# Patient Record
Sex: Male | Born: 1945 | Race: White | Hispanic: No | Marital: Married | Smoking: Former smoker
Health system: Southern US, Community
[De-identification: ages and names within clinical notes are randomized; demographics above are authoritative.]

## PROBLEM LIST (undated history)

## (undated) DIAGNOSIS — C4491 Basal cell carcinoma of skin, unspecified: Secondary | ICD-10-CM

## (undated) DIAGNOSIS — R972 Elevated prostate specific antigen [PSA]: Secondary | ICD-10-CM

## (undated) DIAGNOSIS — I10 Essential (primary) hypertension: Secondary | ICD-10-CM

## (undated) DIAGNOSIS — J449 Chronic obstructive pulmonary disease, unspecified: Secondary | ICD-10-CM

## (undated) DIAGNOSIS — I6529 Occlusion and stenosis of unspecified carotid artery: Secondary | ICD-10-CM

## (undated) DIAGNOSIS — E785 Hyperlipidemia, unspecified: Secondary | ICD-10-CM

## (undated) DIAGNOSIS — M7072 Other bursitis of hip, left hip: Secondary | ICD-10-CM

## (undated) DIAGNOSIS — M1990 Unspecified osteoarthritis, unspecified site: Secondary | ICD-10-CM

## (undated) DIAGNOSIS — I4510 Unspecified right bundle-branch block: Secondary | ICD-10-CM

## (undated) DIAGNOSIS — T8859XA Other complications of anesthesia, initial encounter: Secondary | ICD-10-CM

## (undated) DIAGNOSIS — R011 Cardiac murmur, unspecified: Secondary | ICD-10-CM

## (undated) DIAGNOSIS — K5790 Diverticulosis of intestine, part unspecified, without perforation or abscess without bleeding: Secondary | ICD-10-CM

## (undated) DIAGNOSIS — R0600 Dyspnea, unspecified: Secondary | ICD-10-CM

## (undated) DIAGNOSIS — K648 Other hemorrhoids: Secondary | ICD-10-CM

## (undated) DIAGNOSIS — F419 Anxiety disorder, unspecified: Secondary | ICD-10-CM

## (undated) DIAGNOSIS — G4733 Obstructive sleep apnea (adult) (pediatric): Secondary | ICD-10-CM

## (undated) DIAGNOSIS — N419 Inflammatory disease of prostate, unspecified: Secondary | ICD-10-CM

## (undated) DIAGNOSIS — D649 Anemia, unspecified: Secondary | ICD-10-CM

## (undated) DIAGNOSIS — N400 Enlarged prostate without lower urinary tract symptoms: Secondary | ICD-10-CM

## (undated) DIAGNOSIS — T4145XA Adverse effect of unspecified anesthetic, initial encounter: Secondary | ICD-10-CM

## (undated) HISTORY — DX: Occlusion and stenosis of unspecified carotid artery: I65.29

## (undated) HISTORY — DX: Anemia, unspecified: D64.9

## (undated) HISTORY — DX: Diverticulosis of intestine, part unspecified, without perforation or abscess without bleeding: K57.90

## (undated) HISTORY — DX: Chronic obstructive pulmonary disease, unspecified: J44.9

## (undated) HISTORY — DX: Obstructive sleep apnea (adult) (pediatric): G47.33

## (undated) HISTORY — DX: Basal cell carcinoma of skin, unspecified: C44.91

## (undated) HISTORY — PX: PROSTATE BIOPSY: SHX241

## (undated) HISTORY — DX: Other hemorrhoids: K64.8

## (undated) HISTORY — DX: Cardiac murmur, unspecified: R01.1

## (undated) HISTORY — DX: Inflammatory disease of prostate, unspecified: N41.9

## (undated) HISTORY — DX: Other bursitis of hip, left hip: M70.72

## (undated) HISTORY — DX: Elevated prostate specific antigen (PSA): R97.20

## (undated) HISTORY — DX: Anxiety disorder, unspecified: F41.9

## (undated) HISTORY — PX: PROSTATE SURGERY: SHX751

---

## 1998-03-26 ENCOUNTER — Ambulatory Visit (HOSPITAL_COMMUNITY): Admission: RE | Admit: 1998-03-26 | Discharge: 1998-03-26 | Payer: Self-pay | Admitting: Internal Medicine

## 2000-04-09 ENCOUNTER — Emergency Department (HOSPITAL_COMMUNITY): Admission: EM | Admit: 2000-04-09 | Discharge: 2000-04-09 | Payer: Self-pay | Admitting: Emergency Medicine

## 2008-09-18 HISTORY — PX: COLONOSCOPY: SHX174

## 2011-02-02 ENCOUNTER — Ambulatory Visit (INDEPENDENT_AMBULATORY_CARE_PROVIDER_SITE_OTHER): Payer: BC Managed Care – PPO | Admitting: Internal Medicine

## 2011-02-02 ENCOUNTER — Encounter: Payer: Self-pay | Admitting: Internal Medicine

## 2011-02-02 VITALS — BP 124/80 | HR 76 | Ht 67.5 in | Wt 167.0 lb

## 2011-02-02 DIAGNOSIS — Z0000 Encounter for general adult medical examination without abnormal findings: Secondary | ICD-10-CM

## 2011-02-02 DIAGNOSIS — R0989 Other specified symptoms and signs involving the circulatory and respiratory systems: Secondary | ICD-10-CM

## 2011-02-02 DIAGNOSIS — F17200 Nicotine dependence, unspecified, uncomplicated: Secondary | ICD-10-CM

## 2011-02-02 DIAGNOSIS — R030 Elevated blood-pressure reading, without diagnosis of hypertension: Secondary | ICD-10-CM

## 2011-02-02 DIAGNOSIS — J42 Unspecified chronic bronchitis: Secondary | ICD-10-CM

## 2011-02-02 NOTE — Progress Notes (Signed)
Subjective:    Patient ID: Brian Hunt, male    DOB: 08-06-1946, 65 y.o.   MRN: [id number]  HPI [name] is here to establish as primary care patient. His BP was noted to be 180/94 @ [facility] 's office in 12/2010; he has no PMH of HTN. It has come down w/o Rx.    Review of Systems Patient reports no  vision/ hearing changes,anorexia, weight change, fever ,adenopathy, persistant / recurrent hoarseness, swallowing issues, chest pain,palpitations, edema, hemoptysis, dyspnea(rest, exertional, paroxysmal nocturnal), gastrointestinal  bleeding (melena, rectal bleeding), abdominal pain, excessive heart burn, GU symptoms( dysuria, hematuria, pyuria, voiding/incontinence  issues) syncope, focal weakness, memory loss, hair/nail changes,depression, anxiety, or  abnormal bruising/bleeding. Active bursitis L hip ; unresponsive to steroid injection X 1 by Dr Brian Hunt. Dr. Elmer Hunt states that he may have early glaucoma and macular degeneration. There has been no definite diagnosis made to date. He does have a new skin lesion which he plans to have checked to rule out a recurrent basal cell cancer. He has a daily cough, especially in the mornings with sputum production.     Objective:   Physical Exam Gen.: Healthy and well-nourished in appearance. Alert, appropriate and cooperative throughout exam. Head: Normocephalic without obvious abnormalities;  pattern alopecia . Beard & moustache. Eyes: No corneal or conjunctival inflammation noted. Pupils equal round reactive to light and accommodation. Fundal exam is benign without hemorrhages, exudate, papilledema. Extraocular motion intact. Vision grossly normal. Ears: External  ear exam reveals no significant lesions or deformities. Canals clear .TMs normal. Hearing is grossly normal bilaterally with glasses. Nose: External nasal exam reveals no deformity or inflammation. Nasal mucosa are pink and moist. No lesions or exudates noted.  Mouth: Oral mucosa and oropharynx reveal  no lesions or exudates. Teeth in good repair. Neck: No deformities, masses, or tenderness noted. Range of motion &. Thyroid  normal. Lungs: Normal respiratory effort; chest expands symmetrically. Lungs are clear to auscultation without rales, wheezes, or increased work of breathing. Heart: Normal rate and rhythm. Normal S1 and S2. No gallop, click, or rub. S4 with slurring; no murmur. Abdomen: Bowel sounds normal; abdomen soft and nontender. No masses, organomegaly or hernias noted. Genitalia: Dr Brian Hunt.                                                                                     Musculoskeletal/extremities: No deformity or scoliosis noted of  the thoracic or lumbar spine but asymmetrical thoracic musculature, R > L . No clubbing, cyanosis, edema, or deformity noted. Range of motion  normal .Tone & strength  normal.Joints normal. Nail health  good. Vascular: Carotid, radial artery, dorsalis pedis and dorsalis posterior tibial pulses are full and equal.  l carotid  Bruit  Present.faint rumble R carotid Neurologic: Alert and oriented x3. Deep tendon reflexes symmetrical and normal.         Skin: Intact without suspicious lesions or rashes. Lymph: No cervical, axillary, or inguinal lymphadenopathy present. Psych: Mood and affect are normal. Normally interactive  Assessment & Plan:  #1 Complete physical, new patient .                                                                                                             #2 elevated BP ,PMH of, w/o diagnosis of HTN. BP now controlled.                                                           #3 carotid bruits.                                                                                                                                           #4 smoker; risk discussed .                                                                                                                          Plan: BP monitor; goal = average < 135/85.See orders &recommendations.

## 2011-02-02 NOTE — Progress Notes (Signed)
Addended by: Stephan Minister on: 02/02/2011 01:58 PM   Modules accepted: Orders

## 2011-02-02 NOTE — Patient Instructions (Addendum)
Please schedule fasting labs: BMET, hepatic panel, CBC & dif,TSH, & lipids (V70.0) .                           Preventive Health Care: Exercise at least 30-45 minutes a day,  3-4 days a week.  Eat a low-fat diet with lots of fruits and vegetables, up to 7-9 servings per day. Avoid obesity; your goal is waist measurement < 40 inches.Consume less than 40 grams of sugar per day from foods & drinks with High Fructose Corn Sugar as #2,3 or # 4 on label. Health Care Power of Attorney & Living Will. Complete if not in place ; these place you in charge of your health care decisions. Please think about quitting smoking. Review the risks we discussed. Please call 1-800-QUIT-NOW [phone]) for free smoking cessation counseling.  Continue daily aspirin.

## 2011-02-08 ENCOUNTER — Ambulatory Visit (INDEPENDENT_AMBULATORY_CARE_PROVIDER_SITE_OTHER)
Admission: RE | Admit: 2011-02-08 | Discharge: 2011-02-08 | Disposition: A | Discharge status: Home / Self Care | Payer: BC Managed Care – PPO | Source: Ambulatory Visit | Attending: Internal Medicine | Admitting: Internal Medicine

## 2011-02-08 ENCOUNTER — Other Ambulatory Visit (INDEPENDENT_AMBULATORY_CARE_PROVIDER_SITE_OTHER): Payer: BC Managed Care – PPO

## 2011-02-08 DIAGNOSIS — J42 Unspecified chronic bronchitis: Secondary | ICD-10-CM

## 2011-02-08 DIAGNOSIS — F17200 Nicotine dependence, unspecified, uncomplicated: Secondary | ICD-10-CM

## 2011-02-08 DIAGNOSIS — Z0000 Encounter for general adult medical examination without abnormal findings: Secondary | ICD-10-CM

## 2011-02-08 LAB — LIPID PANEL
Cholesterol: 168 mg/dL (ref 0–200)
LDL Cholesterol: 98 mg/dL (ref 0–99)
Triglycerides: 23 mg/dL (ref 0.0–149.0)
VLDL: 4.6 mg/dL (ref 0.0–40.0)

## 2011-02-08 LAB — CBC WITH DIFFERENTIAL/PLATELET
Basophils Relative: 0.5 % (ref 0.0–3.0)
Eosinophils Relative: 2 % (ref 0.0–5.0)
Lymphocytes Relative: 19.2 % (ref 12.0–46.0)
Monocytes Absolute: 0.7 10*3/uL (ref 0.1–1.0)
Neutrophils Relative %: 68 % (ref 43.0–77.0)
Platelets: 271 10*3/uL (ref 150.0–400.0)
RBC: 4.25 Mil/uL (ref 4.22–5.81)
WBC: 7 10*3/uL (ref 4.5–10.5)

## 2011-02-08 LAB — BASIC METABOLIC PANEL
BUN: 19 mg/dL (ref 6–23)
CO2: 28 mEq/L (ref 19–32)
Chloride: 100 mEq/L (ref 96–112)
Creatinine, Ser: 0.8 mg/dL (ref 0.4–1.5)
Glucose, Bld: 100 mg/dL — ABNORMAL HIGH (ref 70–99)
Potassium: 4.5 mEq/L (ref 3.5–5.1)

## 2011-02-08 LAB — HEPATIC FUNCTION PANEL
ALT: 18 U/L (ref 0–53)
AST: 19 U/L (ref 0–37)
Albumin: 3.6 g/dL (ref 3.5–5.2)
Total Protein: 6.6 g/dL (ref 6.0–8.3)

## 2011-02-08 LAB — TSH: TSH: 2.33 u[IU]/mL (ref 0.35–5.50)

## 2011-02-10 ENCOUNTER — Encounter (INDEPENDENT_AMBULATORY_CARE_PROVIDER_SITE_OTHER): Payer: BC Managed Care – PPO | Admitting: Cardiology

## 2011-02-10 DIAGNOSIS — I6529 Occlusion and stenosis of unspecified carotid artery: Secondary | ICD-10-CM

## 2011-02-10 DIAGNOSIS — R0989 Other specified symptoms and signs involving the circulatory and respiratory systems: Secondary | ICD-10-CM

## 2011-02-10 DIAGNOSIS — F17200 Nicotine dependence, unspecified, uncomplicated: Secondary | ICD-10-CM

## 2011-02-10 DIAGNOSIS — R030 Elevated blood-pressure reading, without diagnosis of hypertension: Secondary | ICD-10-CM

## 2011-02-15 ENCOUNTER — Encounter: Payer: Self-pay | Admitting: Internal Medicine

## 2011-02-16 ENCOUNTER — Telehealth: Payer: Self-pay

## 2011-02-16 NOTE — Telephone Encounter (Signed)
Message copied by Edgardo Roys on Thu Feb 16, 2011 10:14 AM ------      Message from: Pecola Lawless      Created: Thu Feb 16, 2011  7:53 AM       There is marked blockage of the left carotid artery; its not quite clear how severe it is. Please consider the CAT scan to define the degree of blockage better. If you agree I will schedule this study. Please take 325 mg of coated aspirin daily.[employer]

## 2011-02-16 NOTE — Telephone Encounter (Signed)
Spoke with patient, appointment scheduled for tomorrow to further discuss

## 2011-02-17 ENCOUNTER — Ambulatory Visit (INDEPENDENT_AMBULATORY_CARE_PROVIDER_SITE_OTHER): Payer: BC Managed Care – PPO | Admitting: Internal Medicine

## 2011-02-17 ENCOUNTER — Ambulatory Visit: Payer: Self-pay | Admitting: Internal Medicine

## 2011-02-17 ENCOUNTER — Encounter: Payer: Self-pay | Admitting: Internal Medicine

## 2011-02-17 VITALS — BP 130/88 | HR 65 | Wt 167.0 lb

## 2011-02-17 DIAGNOSIS — F17200 Nicotine dependence, unspecified, uncomplicated: Secondary | ICD-10-CM

## 2011-02-17 DIAGNOSIS — I6529 Occlusion and stenosis of unspecified carotid artery: Secondary | ICD-10-CM

## 2011-02-17 MED ORDER — PRAVASTATIN SODIUM 20 MG PO TABS: 20.0000 mg | ORAL_TABLET | Freq: Every evening | ORAL | Status: DC

## 2011-02-17 NOTE — Progress Notes (Signed)
  Subjective:    Patient ID: Brian Hunt, male    DOB: 11-Apr-1946, 65 y.o.   MRN: [id number]  HPI Brian Hunt is here to review his carotid  Doppler findings. This suggests  at least a 60-79% blockage on the left. He is asymptomatic with no neurologic symptoms of dizziness, gait dysfunction, weakness, or numbness or tingling.  His LDL is 98 and his HDL is protective at 65.4. He exercises 3 times a week for a total of at least 90 minutes. His A1c is at the cutoff for diabetes X6.1. This will be associated with a 22 % risk. He continues to smoke which doubles or triples  risk of heart attack or stroke.  His paternal grandmother had  heart attack in her 63s; his mother had TIAs in her 63s.      Review of Systems     Objective:   Physical Exam Gen.: Thin but healthy and well-nourished in appearance. Alert, appropriate and cooperative throughout exam. Lungs: Normal respiratory effort; chest expands symmetrically. Lungs ; mild musical rhonchi w/o  increased work of breathing. Heart: Normal rate and rhythm. Normal S1 and S2. No gallop, click, or rub. No murmur. Vascular: Carotid, radial artery, dorsalis pedis and dorsalis posterior tibial pulses are full and equal. L carotid  Bruit ; R slight "rumble" present. Neurologic: Alert and oriented x3.  Skin: Intact without  Ischemic changes Psych: Mood and affect are normal. Normally interactive                                                                                         Assessment & Plan:  #1 significant left carotid artery disease  #2 smoker  #3 borderline diabetes  Plan: #1 Referral to cardiovascular and thoracic surgeon for their expert  opinion  #2 encourage smoking cessation  #3 full dose coated aspirin, 325 mg  #4 low-dose statin to attempt to get LDL less than 70.

## 2011-02-17 NOTE — Patient Instructions (Addendum)
Preventive Health Care: Exercise at least 30-45 minutes a day,  3-4 days a week.  Eat a low-fat diet with lots of fruits and vegetables, up to 7-9 servings per day. Avoid obesity; your goal is waist measurement < 40 inches.Consume less than 40 grams of sugar per day from foods & drinks with High Fructose Corn Sugar as #2,3 or # 4 on label.  Please think about quitting smoking.It increases your risk of heart attack or stroke 2-3 times normal risk for your age. Please call 1-800-QUIT-NOW [phone]) for free smoking cessation counseling.  Repeat fasting labs in 10 weeks: A1c, lipids, AST,ALT (790.29,carotid artery plaque)

## 2011-03-15 NOTE — Progress Notes (Signed)
[facility] [place]  New Carotid Patient  Referred by: Dr. Marga Melnick  Reason for referral: L carotid stenosis   History of Present Illness  Brian Hunt is a 65 y.o. male who presents with chief complaint: asx right carotid bruit.  Previous carotid studies (02/10/11) demonstrated: RICA 0-39% stenosis, LICA 60-79% stenosis.  Patient has no history of TIA or stroke symptom.  The patient has never had amaurosis fugax or monocular blindness.  The patient has never had facial drooping or hemiplegia.  The patient has never had receptive or expressive aphasia.    Past Medical History  Diagnosis Date  . Bursitis of left hip     Dr. Constance Goltz  . 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 Prostatitis     Dr Vonita Moss  . Elevated PSA     Past Surgical History  Procedure Date  . Prostate biopsy 2007 , 2009    X2 , Dr Cleatrice Burke  & Dr Vonita Moss  . Colonoscopy 2010    negative    History   Social History  . Marital Status: Married    Spouse Name: N/A    Number of Children: N/A  . Years of Education: N/A   Occupational History  . Not on file.   Social History Main Topics  . Smoking status: Current Everyday Smoker -- 1.0 packs/day for 40 years    Types: Cigarettes  . Smokeless tobacco: Not on file  . Alcohol Use: No  . Drug Use: Not on file  . Sexually Active: Not on file   Other Topics Concern  . Not on file   Social History Narrative  . No narrative on file    Family History  Problem Relation Age of Onset  . Arthritis Mother   . Heart disease Mother   . Hypertension Mother   . Bone cancer Maternal Grandmother   . Coronary artery disease Paternal Grandmother     MI in 6s    Current outpatient prescriptions:pravastatin (PRAVACHOL) 20 MG tablet, Take 1 tablet (20 mg total) by mouth every evening. 1/2  qhs, Disp: 30 tablet, Rfl: 2  Allergies as of 03/17/2011  . (No Known Allergies)    Review of Systems (Positive items in bold and italic,  otherwise negative)  General: Weight loss, Weight gain, Loss of appetite, Fever  Neurologic: Dizziness, Blackouts, Headaches, Seizure  Ear/Nose/Throat: Change in eyesight, Change in hearing, Nose bleeds, Sore throat  Vascular: Pain in legs with walking, Pain in feet while lying flat, Non-healing ulcer, Stroke, "Mini stroke", Slurred speech, Temporary blindness, Blood clot in vein, Phlebitis  Pulmonary: Home oxygen, Productive cough, Bronchitis, Coughing up blood, Asthma, Wheezing  Musculoskeletal: Arthritis, Joint pain, Muscle pain  Cardiac: Chest pain, Chest tightness/pressure, Shortness of breath with exertion, Shortness of breath when lying flat, Palpitations, Heart murmur, Arrythmia, Atrial fibrillation  Hematologic: Bleeding problems, Clotting disorder, Anemia  Psychiatric:  Depression, Anxiety, Attention deficit disorder  Gastrointestinal:  Black stool, Blood in stool, Peptic ulcer disease, Reflux, Hiatal hernia, Trouble swallowing, Diarrhea, Constipation  Urinary:  Kidney disease, Burning with urination, Frequent urination, Difficulty urinating  Skin: Ulcers, Rashes  Physical Examination  Filed Vitals:   03/17/11 1004  BP: 163/85  Pulse: 72    General: A&O x 3, WDWN  Head: [place]/AT  Ear/Nose/Throat: Hearing grossly intact, nares w/o erythema or drainage, oropharynx w/o Erythema/Exudate  Eyes: PERRLA, EOMI  Neck: Supple, no nuchal rigidity, no palpable LAD  Pulmonary: Sym exp, good  air movt, CTAB, no rales, rhonchi, & wheezing  Cardiac: RRR, Nl S1, S2, no Murmurs, rubs or gallops  Vascular: Vessel Right Left  Radial Palpable Palpable  Ulnary Palpable Palpable  Brachial Palpable Palpable  Carotid Palpable, without bruit* Palpable, without bruit*  Aorta Non-palpable N/A  Femoral Palpable Palpable  Popliteal Non-palpable Non-palpable  PT Palpable Palpable  DP Palpable Palpable  * heart sounds can be ausc  Gastrointestinal: soft, NTND, -G/R, - HSM, -  masses, - CVAT B  Musculoskeletal: M/S 5/5 throughout, Extremities without ischemic changes  Neurologic: CN 2-12 intact, Pain and light touch intact in extremities, Motor exam as listed above  Psychiatric: Judgment intact, Mood & affect appropriate for pt's clinical situation  Dermatologic: See M/S exam for extremity exam, no rashes otherwise noted  Lymph : No Cervical, Axillary, or Inguinal lymphadenopathy   Non-Invasive Vascular Imaging  CAROTID DUPLEX (Date: 02/10/11):   R ICA stenosis: 0-39%  R VA: patent and antegrade  L ICA stenosis: 60-79%  L VA: patent and antegrade  Outside Studies/Documentation 15 pages of outside documents were reviewed including: B carotid duplex.  Medical Decision Making  Brian Hunt is a 65 y.o. male who presents with: L ICA stenosis.   As a precaution with the severe calcification in the LICA.  I recommend CTA chest and neck to evaluate the LICA by angiographic technique without the 1% stroke risk of a carotid angiogram  I discussed in depth with the patient the nature of atherosclerosis, and emphasized the importance of maximal medical management including strict control of blood pressure, blood glucose, and lipid levels, obtaining regular exercise, and cessation of smoking.  The patient is aware that without maximal medical management the underlying atherosclerotic disease process will progress, limiting the benefit of any interventions.  There are no indications for intervention at this point based on ACAS data, on just the basis of the duplex results.  I recommend B carotid duplex every 6 month for surveillance.  Once the stenosis exceeds 80%, ACAS data would suggest an advantage to proceeding with carotid endarterectomy.  The patient will follow in the next 1-2 weeks after the CTA is completed.  Thank you for allowing [place] to participate in this patient's care.  Leonides Sake, MD [facility] [place] [place] Office:  [phone] Pager: [phone]

## 2011-03-17 ENCOUNTER — Other Ambulatory Visit: Payer: Self-pay | Admitting: Vascular Surgery

## 2011-03-17 ENCOUNTER — Ambulatory Visit (INDEPENDENT_AMBULATORY_CARE_PROVIDER_SITE_OTHER): Payer: BC Managed Care – PPO | Admitting: Vascular Surgery

## 2011-03-17 ENCOUNTER — Encounter: Payer: Self-pay | Admitting: Vascular Surgery

## 2011-03-17 VITALS — BP 163/85 | HR 72

## 2011-03-17 DIAGNOSIS — I6529 Occlusion and stenosis of unspecified carotid artery: Secondary | ICD-10-CM

## 2011-03-17 DIAGNOSIS — I712 Thoracic aortic aneurysm, without rupture: Secondary | ICD-10-CM

## 2011-03-17 NOTE — Progress Notes (Signed)
New carotid pt referred by Dr Alwyn Ren

## 2011-04-05 NOTE — Progress Notes (Signed)
[facility] [place]  Established Carotid Patient  History of Present Illness  Brian Hunt is a 65 y.o. male who presents with chief complaint: follow up on CTA chest and neck. Previous carotid studies demonstrated: RICA <50% stenosis, LICA 60-79% stenosis with possible calcific shadowing leading to underestimation of stenosis.  Patient has remain asymptomatic.    Past Medical History, Past Surgical History, Social History, Family History, Medications, Allergies, and Review of Systems are unchanged from previous evaluation on 03/17/11.  Physical Examination  Filed Vitals:   04/07/11 1021  BP: 174/100  Pulse: 76  Resp: 20   General: A&O x 3, WDWN   Eyes: PERRLA, EOMI   Neurologic: CN 2-12 intact, Pain and light touch intact in extremities, Motor exam as listed above   Non-Invasive Vascular Imaging  CTA Chest and Neck (Date: 04/07/11):    1.  70% stenosis of the proximal left internal carotid artery   secondary to calcified and noncalcified plaque.   2.  Calcified plaque at the proximal right internal carotid artery   and carotid bifurcation without significant stenosis by NASCET   criteria.   3.  Calcified plaque at the origins of the great vessels without   significant stenosis.   4.  1.9 cm dominant right thyroid nodule.  Ultrasound or ultrasound   guided biopsy may be useful for further evaluation.   5.  Emphysema.  Medical Decision Making  Brian Hunt is a 65 y.o. male who presents with: L ICA stenosis.  Based on the CTA Neck and Chest, the patient has a LICA stenosis ~70% by NASCET criteria.  Based on ACAS data, there is no data to support prophylactic carotid endarterectomy. I recommend continuing maximal medical management including statin use, aspirin use, and smoking cessation, which I emphasized the importance of such. He will try to quit smoking on his own prior to coming back for assistance.  I discussed in depth with the patient the  nature of atherosclerosis, and emphasized the importance of maximal medical management including strict control of blood pressure, blood glucose, and lipid levels, obtaining regular exercise, and cessation of smoking.  The patient is aware that without maximal medical management the underlying atherosclerotic disease process will progress, limiting the benefit of any interventions.  We will repeat the B carotid duplexes in 6 months and he will follow up with [place] at that time.  Additionally, I will defer work up on the right thyroid nodule to his PCP, Dr. Marga Melnick.  I will send a note to Dr. Alwyn Ren informing him of such finding.  Thank you for allowing [place] to participate in this patient's care.  Leonides Sake, MD [facility] [place] Office: [phone] Pager: [phone]

## 2011-04-07 ENCOUNTER — Ambulatory Visit (INDEPENDENT_AMBULATORY_CARE_PROVIDER_SITE_OTHER): Payer: BC Managed Care – PPO | Admitting: Vascular Surgery

## 2011-04-07 ENCOUNTER — Ambulatory Visit
Admission: RE | Admit: 2011-04-07 | Discharge: 2011-04-07 | Disposition: A | Discharge status: Home / Self Care | Payer: BC Managed Care – PPO | Source: Ambulatory Visit | Attending: Vascular Surgery | Admitting: Vascular Surgery

## 2011-04-07 ENCOUNTER — Encounter: Payer: Self-pay | Admitting: Vascular Surgery

## 2011-04-07 VITALS — BP 174/100 | HR 76 | Resp 20 | Ht 68.0 in | Wt 172.0 lb

## 2011-04-07 DIAGNOSIS — I6529 Occlusion and stenosis of unspecified carotid artery: Secondary | ICD-10-CM

## 2011-04-07 DIAGNOSIS — I712 Thoracic aortic aneurysm, without rupture: Secondary | ICD-10-CM

## 2011-04-07 MED ORDER — IOHEXOL 350 MG/ML SOLN
150.0000 mL | Freq: Once | INTRAVENOUS | Status: AC | PRN
  Administered 2011-04-07: 150 mL via INTRAVENOUS

## 2011-04-07 NOTE — Progress Notes (Signed)
F/u after CTA today.

## 2011-04-19 ENCOUNTER — Ambulatory Visit (INDEPENDENT_AMBULATORY_CARE_PROVIDER_SITE_OTHER): Payer: BC Managed Care – PPO | Admitting: Internal Medicine

## 2011-04-19 ENCOUNTER — Encounter: Payer: Self-pay | Admitting: Internal Medicine

## 2011-04-19 VITALS — BP 154/98 | HR 64 | Temp 98.7°F | Wt 172.0 lb

## 2011-04-19 DIAGNOSIS — M25552 Pain in left hip: Secondary | ICD-10-CM

## 2011-04-19 DIAGNOSIS — I10 Essential (primary) hypertension: Secondary | ICD-10-CM

## 2011-04-19 DIAGNOSIS — M25559 Pain in unspecified hip: Secondary | ICD-10-CM

## 2011-04-19 MED ORDER — LISINOPRIL 20 MG PO TABS: 20.0000 mg | ORAL_TABLET | Freq: Every day | ORAL | Status: DC

## 2011-04-19 MED ORDER — TRAMADOL HCL 50 MG PO TABS: 50.0000 mg | ORAL_TABLET | Freq: Four times a day (QID) | ORAL | Status: AC | PRN

## 2011-04-19 NOTE — Patient Instructions (Signed)
Blood Pressure Goal  Ideally is an AVERAGE < 135/85. This AVERAGE should be calculated from @ least 5-7 BP readings taken @ different times of day on different days of week. You should not respond to isolated BP readings , but rather the AVERAGE for that week  Please think about quitting smoking. Review the risks we discussed. Please call 1-800-QUIT-NOW ([phone]) for free smoking cessation counseling.

## 2011-04-19 NOTE — Progress Notes (Signed)
  Subjective:    Patient ID: Brian Hunt, male    DOB: 11-20-1945, 65 y.o.   MRN: [id number]  HPI  HYPERTENSION: BP 200/100 @ Orthopedic appt 07/31; 1890/120 two hrs later @ pharmacy Disease Monitoring  Blood pressure range: not monitored @ home  Chest pain: no   Dyspnea: no   Claudication: no  Medication compliance: no meds ; no PMH of HTN  Lightheadedness: no  Urinary frequency: no  Edema: no  Preventitive Healthcare:  Exercise: yes, weights , stretches with resistence bands   Diet Pattern: no plan  Salt Restriction: no FH : mother HTN, CVA, ? MI; PGM MI @ 51      Review of Systems Extremity pain Location:L hip Onset:2010 Trigger/injury:no Pain quality:variable, stabbing with ambulation Pain severity:up to 7 Duration:constant pain as level 4 Radiation:occasinal radiation to l knee Exacerbating factors:walking Treatment/response:NSAIDS, steroid injection 4/12 for bursitis; shot deferred 7/31 due to BP Review of systems: Constitutional: no fever, chills, sweats, change in weight  Musculoskeletal:L thigh  muscle  pain; no  joint redness or swelling; L hip stffness Skin:no rash, color change Neuro: no  incontinence (stool/urine); numbness and tingling; some weakness LLE Heme:no lymphadenopathy; abnormal bruising or bleeding        Objective:   Physical Exam Gen.: Healthy  in appearance. Alert, appropriate and cooperative throughout exam. Eyes: No corneal or conjunctival inflammation noted. Fundal exam is benign without hemorrhages, exudate, papilledema.  Lungs: Normal respiratory effort; chest expands symmetrically. Lungs are clear to auscultation without rales, wheezes, or increased work of breathing. Heart: Slow rate and regular  rhythm. Normal S1 and S2. No gallop, click, or rub. S4 with slurring; no  murmur. Abdomen: Bowel sounds normal; abdomen soft and nontender. No masses, organomegaly or hernias noted. No AAA or bruits                                        Musculoskeletal/extremities: No clubbing, cyanosis, edema, or deformity noted. Range of motion  Normal but pain with ROM L hip .Tone & strength  normal.Joints normal. Nail health  good. Vascular: Carotid, radial artery, dorsalis pedis and  posterior tibial pulses are full and equal. No bruits present. Neurologic: Alert and oriented x3. Deep tendon reflexes symmetrical and normal.          Skin: Intact without suspicious lesions or rashes. Lymph: No cervical, axillary lymphadenopathy present. Psych: Mood and affect are normal. Normally interactive                                                                                         Assessment & Plan:  #1 hypertension; and the context of family history of hypertension and chronic hip pain  #2 hip pain; probable degenerative joint disease.  Plan: See orders& patient instructions.

## 2011-05-17 ENCOUNTER — Other Ambulatory Visit (HOSPITAL_COMMUNITY): Payer: Self-pay

## 2011-05-17 ENCOUNTER — Encounter (HOSPITAL_COMMUNITY)
Admission: RE | Admit: 2011-05-17 | Discharge: 2011-05-17 | Disposition: A | Discharge status: Home / Self Care | Payer: BC Managed Care – PPO | Source: Ambulatory Visit | Attending: Orthopedic Surgery | Admitting: Orthopedic Surgery

## 2011-05-17 LAB — URINALYSIS, ROUTINE W REFLEX MICROSCOPIC
Glucose, UA: NEGATIVE mg/dL
Hgb urine dipstick: NEGATIVE
Leukocytes, UA: NEGATIVE
pH: 6.5 (ref 5.0–8.0)

## 2011-05-17 LAB — CBC
Hemoglobin: 14.1 g/dL (ref 13.0–17.0)
MCH: 31.2 pg (ref 26.0–34.0)
MCHC: 34 g/dL (ref 30.0–36.0)
MCV: 91.8 fL (ref 78.0–100.0)
RBC: 4.52 MIL/uL (ref 4.22–5.81)

## 2011-05-17 LAB — DIFFERENTIAL
Basophils Relative: 1 % (ref 0–1)
Eosinophils Absolute: 0.5 10*3/uL (ref 0.0–0.7)
Lymphs Abs: 1.6 10*3/uL (ref 0.7–4.0)
Monocytes Absolute: 1.3 10*3/uL — ABNORMAL HIGH (ref 0.1–1.0)
Monocytes Relative: 15 % — ABNORMAL HIGH (ref 3–12)
Neutro Abs: 5.1 10*3/uL (ref 1.7–7.7)
Neutrophils Relative %: 60 % (ref 43–77)

## 2011-05-17 LAB — URINE MICROSCOPIC-ADD ON

## 2011-05-17 LAB — SURGICAL PCR SCREEN
MRSA, PCR: NEGATIVE
Staphylococcus aureus: NEGATIVE

## 2011-05-17 LAB — BASIC METABOLIC PANEL
BUN: 15 mg/dL (ref 6–23)
Chloride: 95 mEq/L — ABNORMAL LOW (ref 96–112)
GFR calc Af Amer: >60 mL/min (ref >60)
GFR calc non Af Amer: >60 mL/min (ref >60)
Potassium: 4.8 mEq/L (ref 3.5–5.1)
Sodium: 131 mEq/L — ABNORMAL LOW (ref 135–145)

## 2011-05-17 LAB — PROTIME-INR: Prothrombin Time: 13.7 seconds (ref 11.6–15.2)

## 2011-05-17 LAB — TYPE AND SCREEN
ABO/RH(D): O POS
Antibody Screen: NEGATIVE

## 2011-05-18 ENCOUNTER — Other Ambulatory Visit: Payer: Self-pay | Admitting: Internal Medicine

## 2011-05-26 ENCOUNTER — Inpatient Hospital Stay (HOSPITAL_COMMUNITY)
Admission: RE | Admit: 2011-05-26 | Discharge: 2011-05-28 | DRG: 818 | Disposition: A | Discharge status: Home / Self Care | Payer: BC Managed Care – PPO | Source: Ambulatory Visit | Attending: Orthopedic Surgery | Admitting: Orthopedic Surgery

## 2011-05-26 ENCOUNTER — Inpatient Hospital Stay (HOSPITAL_COMMUNITY): Payer: BC Managed Care – PPO

## 2011-05-26 DIAGNOSIS — I6529 Occlusion and stenosis of unspecified carotid artery: Secondary | ICD-10-CM | POA: Diagnosis present

## 2011-05-26 DIAGNOSIS — J449 Chronic obstructive pulmonary disease, unspecified: Secondary | ICD-10-CM | POA: Diagnosis present

## 2011-05-26 DIAGNOSIS — F17200 Nicotine dependence, unspecified, uncomplicated: Secondary | ICD-10-CM | POA: Diagnosis present

## 2011-05-26 DIAGNOSIS — J4489 Other specified chronic obstructive pulmonary disease: Secondary | ICD-10-CM | POA: Diagnosis present

## 2011-05-26 DIAGNOSIS — R110 Nausea: Secondary | ICD-10-CM | POA: Diagnosis present

## 2011-05-26 DIAGNOSIS — M1610 Unilateral primary osteoarthritis, unspecified hip: Principal | ICD-10-CM | POA: Diagnosis present

## 2011-05-26 DIAGNOSIS — M169 Osteoarthritis of hip, unspecified: Principal | ICD-10-CM | POA: Diagnosis present

## 2011-05-26 DIAGNOSIS — E785 Hyperlipidemia, unspecified: Secondary | ICD-10-CM | POA: Diagnosis present

## 2011-05-26 DIAGNOSIS — Z01812 Encounter for preprocedural laboratory examination: Secondary | ICD-10-CM

## 2011-05-26 DIAGNOSIS — I10 Essential (primary) hypertension: Secondary | ICD-10-CM | POA: Diagnosis present

## 2011-05-26 LAB — GLUCOSE, CAPILLARY

## 2011-05-27 LAB — BASIC METABOLIC PANEL
BUN: 14 mg/dL (ref 6–23)
CO2: 29 mEq/L (ref 19–32)
Calcium: 8.5 mg/dL (ref 8.4–10.5)
Chloride: 95 mEq/L — ABNORMAL LOW (ref 96–112)
Creatinine, Ser: 0.96 mg/dL (ref 0.50–1.35)
Glucose, Bld: 118 mg/dL — ABNORMAL HIGH (ref 70–99)

## 2011-05-27 LAB — CBC
HCT: 26.1 % — ABNORMAL LOW (ref 39.0–52.0)
Hemoglobin: 8.9 g/dL — ABNORMAL LOW (ref 13.0–17.0)
MCH: 31.2 pg (ref 26.0–34.0)
MCHC: 34.1 g/dL (ref 30.0–36.0)
MCV: 91.6 fL (ref 78.0–100.0)
RDW: 12.7 % (ref 11.5–15.5)

## 2011-05-28 LAB — CBC
HCT: 26.7 % — ABNORMAL LOW (ref 39.0–52.0)
MCH: 31.8 pg (ref 26.0–34.0)
MCV: 91.4 fL (ref 78.0–100.0)
Platelets: 206 10*3/uL (ref 150–400)
RDW: 12.7 % (ref 11.5–15.5)

## 2011-05-29 NOTE — Op Note (Signed)
Brian Hunt, Brian Hunt                ACCOUNT NO.:  [id number]  MEDICAL RECORD NO.:  [id number]  LOCATION:  2550                         FACILITY:  [facility]  PHYSICIAN:  Feliberto Gottron. Turner Daniels, M.D.   DATE OF BIRTH:  [birth year]  DATE OF PROCEDURE:  05/26/2011 DATE OF DISCHARGE:                              OPERATIVE REPORT   PREOPERATIVE DIAGNOSIS:  End-stage arthritis, left hip.  POSTOPERATIVE DIAGNOSIS:  End-stage arthritis, left hip.  PROCEDURE:  Left total hip arthroplasty using DePuy 54-mm pinnacle cup, 10-degree polyethylene liner, index posterior and superior, central occluder, 18 x 13 x 160 x 42 S-ROM stem, 29F small cone, +0 36-mm ceramic head.  SURGEON:  Feliberto Gottron. Turner Daniels, MD  FIRST ASSISTANT:  Shirl Harris, PA-C  ANESTHETIC:  General endotracheal.  ESTIMATED BLOOD LOSS:  400 mL.  FLUID REPLACEMENT:  1500 mL of crystalloid.  DRAINS PLACED:  None.  INDICATIONS FOR PROCEDURE:  This is a 65 year old male with end-stage arthritis of his left hip with lateral subluxation of the femoral head, bone-on-bone arthritis, who has failed conservative measures with antiinflammatory medicines, physical therapy, judicious use of narcotics, and use of a cane or a crutch.  He has pain that wakes him up at night, prevents him from doing activities of daily living and he has gotten to the point now he is almost falling down secondary to the pain. He desires elective left total hip arthroplasty.  Risks and benefits of surgery have been discussed, questions answered.  DESCRIPTION OF PROCEDURE:  The patient was identified by armband and received preoperative IV antibiotics in the holding area at [facility], taken to the operating room #1.  Appropriate anesthetic monitors were attached.  General endotracheal anesthesia was induced with the patient in supine position.  He was then rolled into the right lateral decubitus position, fixed there with a Stulberg Mark II pelvic clamp and the  left lower extremity was prepped and draped in the usual sterile fashion from the ankle to the hemipelvis.  A time-out procedure was performed.  The skin along the lateral hip and thigh was infiltrated with 10 mL of 0.5% Marcaine and epinephrine solution and a 12-cm incision allowing a posterolateral approach to the hip joint was made through the skin and subcutaneous tissue down to the level of the IT band which was cut line with the skin incision.  This exposed the greater trochanter, short external rotators, and the quadratus femoris. Short external rotators and piriformis were then tagged with a #2 Ethibond suture, cut off their insertion on the intertrochanteric crest exposing the posterior aspect of the hip joint capsule which was then developed into an acetabular-based flap going from posterior to superior off the rim of the acetabulum and out over the femoral neck and exiting posteroinferior on the acetabular rim.  This was tagged with #2 Ethibond sutures exposing the arthritic femoral head which was down to bare bone. The hip was flexed and internally rotated dislocating the femoral head and a standard neck cut performed one fingerbreadth above the lesser trochanter.  The proximal femur was then translated anteriorly levering off the anterior column with a Hohmann retractor.  A spike Cobra was placed in  the cotyloid notch and a posteroinferior wing retractor placed exposing the acetabulum.  The labrum was excised.  We then reamed up to a 53-mm basket reamer obtaining good coverage in all quadrants. Satisfied with the reaming.  The edge was then reamed with a 54 and we hammered into place a 54-mm pinnacle cup with no holes obtaining good coverage in all quadrants and good fixation.  You could almost lift the pelvis off the table with the handle of the inserter.  A trial 10-degree liner index posterior and superior was then hammered into the shell. The hip was flexed and internally  rotated exposing the proximal femur which was then entered with the initiating reamer, the T-handled reamer, and then actually reamed at 8, 10, 12, 13, 13.5 full depth and 14 mm partial depth getting chatter at the 13-mm mark.  We then conically reamed the proximal femur up to an 25F cone to the appropriate depth for 42 base neck and reamed the calcar up to an 25F small calcar.  A trial 25F small cone was then hammered into place followed by trial stem with a 42 base neck and a +0 36-mm ball in the same version as the calcar which was about 15 degrees combined with eversion of the shell, combined anteversion was around 40 degrees.  The hip was reduced.  Stability was noted to full extension and external rotation of about 35 degrees with no instability at flexion of 90 with internal rotation to 75 degrees, again no instability.  At this point, the trial components were removed. In the shell, we placed a central occluder and a real 10-degree liner index posterior and superior to accept a 36-mm head.  On the femoral side, an 25F small ZTT1 cone was hammered into place followed by an 18 x 13 x 160 x 42 stem same version as the calcar with a +0 36-mm ceramic head.  The hip was reduced.  Stability was checked for more time and found to be excellent.  The wound was irrigated out with normal saline and pulse hand lavage.  Capsular flap and short external rotators were repaired back to the intertrochanteric crest through 3 drill holes using #2 Ethibond suture.  The IT band was then closed with running #1 Vicryl suture, the subcutaneous tissue with 0 and 2-0 undyed Vicryl suture, and the skin with a running interlocking 3-0 nylon suture.  A dressing of Xeroform and Mepilex was then applied.  The patient was unclamped, rolled supine, awakened, and extubated, taken to the recovery room without difficulty.     Feliberto Gottron. Turner Daniels, M.D.     Ovid Curd  D:  05/26/2011  T:  05/26/2011  Job:   [id number]  Electronically Signed by Gean Birchwood M.D. on 05/29/2011 06:27:54 AM

## 2011-06-14 NOTE — Discharge Summary (Signed)
  NAMESTACY, Brian Hunt                ACCOUNT NO.:  [id number]  MEDICAL RECORD NO.:  [id number]  LOCATION:  5024                         FACILITY:  [facility]  PHYSICIAN:  Feliberto Gottron. Turner Daniels, M.D.   DATE OF BIRTH:  Feb 08, 1946  DATE OF ADMISSION:  05/26/2011 DATE OF DISCHARGE:  05/28/2011                              DISCHARGE SUMMARY   CHIEF COMPLAINT:  Left hip pain.  HISTORY OF PRESENT ILLNESS:  This is a 65 year old gentleman who complains of severe unremitting pain in his left hip despite extensive conservative treatment.  He now desires a surgical intervention.  All risks and benefits of surgery were discussed with the patient.  PAST MEDICAL HISTORY:  Significant for hypertension, carotid stenosis, and hyperlipidemia.  He has never had a surgery.  SOCIAL HISTORY:  He smokes one-pack of cigarettes per day and denies use of alcohol.  FAMILY HISTORY:  Positive for hypertension and coronary artery disease.  ALLERGIES:  No known drug allergies.  PHYSICAL EXAMINATION:  Gross examination of the left hip demonstrates 10 degrees of internal rotation and 40 degrees of external rotation.  He is neurovascularly intact.  Foot tap is negative.  X-rays of the left hip demonstrate bone-on-bone degenerative joint disease with collapse of the femoral head.  PREOP LABORATORY DATA:  White blood cells 6.7, red blood cells 2.85, PTT 16, INR 1.25.  Sodium 129, potassium 4.2, chloride 95, glucose 118, BUN 14, creatinine 0.96.  Urinalysis was within normal limits.  HOSPITAL COURSE:  Brian Hunt was admitted to [facility] on May 26, 2011, when he underwent left total hip arthroplasty.  The procedure was performed by Dr. Gean Birchwood and the patient tolerated it well.  A perioperative Foley catheter was placed and he was transferred to the floor on Lovenox and Coumadin for DVT prophylaxis.  Several hours after surgery, the patient became diaphoretic and pale.  He also complained of nausea and had  hypotension.  A chest x-ray and EKG were both within normal limits.  The patient's symptoms resolved after fluid bolus.  On the first postoperative day, the patient reported that he felt much better and had no complaints.  His hip pain was well controlled. Surgical dressing was clean, and he began working with Physical Therapy. On the second postoperative day, he was eating well and ambulating independently with a walker.  He met all of his physical therapy goals and was discharged home.  DISPOSITION:  The patient was discharged home on May 28, 2011.  He was weightbearing as tolerated and will return to the clinic in 10-12 days for x-rays and suture removal.  He would remain on Coumadin for a total of 2 weeks with a target INR of 1.5-2.0.  This will be managed by home health along with his physical therapy.  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     Shirl Harris, PA   ______________________________ Feliberto Gottron. Turner Daniels, M.D.    JW/MEDQ  D:  06/06/2011  T:  06/06/2011  Job:  [id number]  Electronically Signed by Shirl Harris PA on 06/13/2011 08:44:41 AM Electronically Signed by Gean Birchwood M.D. on 06/14/2011 08:52:49 AM

## 2011-06-30 HISTORY — PX: JOINT REPLACEMENT: SHX530

## 2011-09-05 ENCOUNTER — Telehealth: Payer: Self-pay

## 2011-09-05 MED ORDER — AMOXICILLIN 500 MG PO CAPS: ORAL_CAPSULE | ORAL | Status: DC

## 2011-09-05 NOTE — Telephone Encounter (Signed)
Patient would like a rx to have dental work, patient had hip replacement in 05/2011 and was told he should be medicated due to recent surgery. [facility] [place] and [place]  Dr.Hopper please advise    Per Dr.Hopper Amoxicillin 500 mg 4 pills1 hour prior

## 2011-10-12 ENCOUNTER — Encounter: Payer: Self-pay | Admitting: Vascular Surgery

## 2011-10-13 ENCOUNTER — Encounter: Payer: Self-pay | Admitting: Vascular Surgery

## 2011-10-13 ENCOUNTER — Ambulatory Visit (INDEPENDENT_AMBULATORY_CARE_PROVIDER_SITE_OTHER): Payer: Medicare Other | Admitting: Vascular Surgery

## 2011-10-13 ENCOUNTER — Other Ambulatory Visit (INDEPENDENT_AMBULATORY_CARE_PROVIDER_SITE_OTHER): Payer: Medicare Other | Admitting: *Deleted

## 2011-10-13 VITALS — BP 169/96 | HR 70 | Resp 16 | Ht 68.5 in | Wt 177.0 lb

## 2011-10-13 DIAGNOSIS — I6529 Occlusion and stenosis of unspecified carotid artery: Secondary | ICD-10-CM

## 2011-10-13 NOTE — Progress Notes (Signed)
[facility] [place]  Established Carotid Patient  History of Present Illness  Brian Hunt is a 66 y.o. male who presents with chief complaint: routine follow up.  Previous carotid studies demonstrated: RICA minimal stenosis, LICA 70% stenosis.  Patient has no history of TIA or stroke symptom.  The patient has never had amaurosis fugax or monocular blindness.  The patient has never had facial drooping or hemiplegia.  The patient has never had receptive or expressive aphasia.    Past Medical History, Past Surgical History, Social History, Family History, Medications, Allergies, and Review of Systems are unchanged from previous evaluation on 04/07/11.  Physical Examination  Filed Vitals:   10/13/11 1112 10/13/11 1115  BP: 179/95 169/96  Pulse: 68 70  Resp: 16   Height: 5' 8.5" (1.74 m)   Weight: 177 lb (80.287 kg)   SpO2: 98%    Body mass index is 26.52 kg/(m^2).   General: A&O x 3, WDWN  Eyes: PERRLA, EOMI  Pulmonary: Sym exp, good air movt, CTAB, no rales, rhonchi, & wheezing  Cardiac: RRR, Nl S1, S2, no Murmurs, rubs or gallops  Vascular: Vessel Right Left  Radial Palpable Palpable  Brachial Palpable Palpable  Carotid Palpable, without bruit Palpable, without bruit  Aorta Non-palpable N/A  Femoral Palpable Palpable  Popliteal Non-palpable Non-palpable  PT Palpable Palpable  DP Palpable Palpable   Gastrointestinal: soft, NTND, -G/R, - HSM, - masses, - CVAT B  Musculoskeletal: M/S 5/5 throughout , Extremities without ischemic changes   Neurologic: CN 2-12 intact , Pain and light touch intact in extremities , Motor exam as listed above  Non-Invasive Vascular Imaging  CAROTID DUPLEX (Date: 10/12/10):   R ICA stenosis: 1-39%  R VA: patent and antegrade  L ICA stenosis: 40-59%, PSV 205/54, heavy calcification  L VA:  patent and antegrade  Medical Decision Making  Brian Hunt is a 66 y.o. male who presents with: L ICA stenosis  60-79%.   The pt prior PSV w 277 c/s which correlated with a 70% on CTA, so I suspect the stenosis is still <80%, so no indication for CEA  Based on the patient's vascular studies and examination, I have offered the patient: continue q6 month carotid surveillance.  He will follow up q6 month with B carotid duplex and once a year with our nurse practitioner who will be seeing protocol patients.  I discussed in depth with the patient the nature of atherosclerosis, and emphasized the importance of maximal medical management including strict control of blood pressure, blood glucose, and lipid levels, obtaining regular exercise, and cessation of smoking.  The patient is aware that without maximal medical management the underlying atherosclerotic disease process will progress, limiting the benefit of any interventions.  Thank you for allowing [place] to participate in this patient's care.  Leonides Sake, MD [facility] [place] Office: [phone] Pager: [phone]  10/13/2011, 2:59 PM

## 2011-10-19 NOTE — Procedures (Unsigned)
CAROTID DUPLEX EXAM  INDICATION:  Followup carotid disease  HISTORY: 70% left ICA stenosis by CT. Diabetes:  No Cardiac:  No Hypertension:  Yes Smoking:  Yes Previous Surgery:  No CV History: Amaurosis Fugax No, Paresthesias No, Hemiparesis No                                      RIGHT                LEFT Brachial systolic pressure:         190                  182 Brachial Doppler waveforms:         WNL                  WNL Vertebral direction of flow:        Antegrade            Antegrade DUPLEX VELOCITIES (cm/sec) CCA peak systolic                   96                   81 ECA peak systolic                   130                  77 ICA peak systolic                   54                   205 ICA end diastolic                   10                   54 PLAQUE MORPHOLOGY:                  Heterogeneous/calcific                 Calcific PLAQUE AMOUNT:                      Mild                 Moderate ? PLAQUE LOCATION:                    ICA                  ICA  IMPRESSION: 1. 1%-39% right internal carotid artery stenosis. 2. 40%-59% left internal carotid artery stenosis by velocity, however,     Doppler is unable to penetrate calcific shadow.  Velocity     measurement is taken just distal to the stenosis and may not     reflect severity of disease. 3. Bilateral vertebral arteries are within normal limits. 4. 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  ___________________________________________ Fransisco Hertz, MD  LT/MEDQ  D:  10/13/2011  T:  10/13/2011  Job:  [id number]

## 2011-11-13 ENCOUNTER — Other Ambulatory Visit: Payer: Self-pay | Admitting: Internal Medicine

## 2011-11-13 NOTE — Telephone Encounter (Signed)
Lipids/Hepatic 272.4/995.20

## 2012-04-11 ENCOUNTER — Encounter: Payer: Self-pay | Admitting: Neurosurgery

## 2012-04-12 ENCOUNTER — Other Ambulatory Visit (INDEPENDENT_AMBULATORY_CARE_PROVIDER_SITE_OTHER): Payer: Medicare Other | Admitting: *Deleted

## 2012-04-12 ENCOUNTER — Encounter: Payer: Self-pay | Admitting: Neurosurgery

## 2012-04-12 ENCOUNTER — Ambulatory Visit (INDEPENDENT_AMBULATORY_CARE_PROVIDER_SITE_OTHER): Payer: Medicare Other | Admitting: Neurosurgery

## 2012-04-12 VITALS — BP 136/84 | HR 64 | Resp 16 | Ht 68.0 in | Wt 177.5 lb

## 2012-04-12 DIAGNOSIS — I6529 Occlusion and stenosis of unspecified carotid artery: Secondary | ICD-10-CM

## 2012-04-12 NOTE — Addendum Note (Signed)
Addended by: Sharee Pimple on: 04/12/2012 12:23 PM   Modules accepted: Orders

## 2012-04-12 NOTE — Progress Notes (Signed)
[facility] [place] Carotid Office Note  CC: Six-month carotid surveillance Referring Physician: Imogene Burn  History of Present Illness: 66 year old male patient of Dr. Imogene Burn followed for known carotid stenosis left greater than right. The patient denies any signs or symptoms of CVA, TIA, amaurosis fugax or any neural deficit. Patient denies any new medical diagnoses or recent surgery.  Past Medical History  Diagnosis Date  . Bursitis of left hip     Dr. Constance Goltz  . 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 Prostatitis     Dr Vonita Moss  . Elevated PSA   . Carotid artery occlusion   . COPD (chronic obstructive pulmonary disease)     ROS: [x]  Positive   [ ]  Denies    General: [ ]  Weight loss, [ ]  Fever, [ ]  chills Neurologic: [ ]  Dizziness, [ ]  Blackouts, [ ]  Seizure [ ]  Stroke, [ ]  "Mini stroke", [ ]  Slurred speech, [ ]  Temporary blindness; [ ]  weakness in arms or legs, [ ]  Hoarseness Cardiac: [ ]  Chest pain/pressure, [ ]  Shortness of breath at rest [ ]  Shortness of breath with exertion, [ ]  Atrial fibrillation or irregular heartbeat Vascular: [ ]  Pain in legs with walking, [ ]  Pain in legs at rest, [ ]  Pain in legs at night,  [ ]  Non-healing ulcer, [ ]  Blood clot in vein/DVT,   Pulmonary: [ ]  Home oxygen, [ ]  Productive cough, [ ]  Coughing up blood, [ ]  Asthma,  [ ]  Wheezing Musculoskeletal:  [ ]  Arthritis, [ ]  Low back pain, [ ]  Joint pain Hematologic: [ ]  Easy Bruising, [ ]  Anemia; [ ]  Hepatitis Gastrointestinal: [ ]  Blood in stool, [ ]  Gastroesophageal Reflux/heartburn, [ ]  Trouble swallowing Urinary: [ ]  chronic Kidney disease, [ ]  on HD - [ ]  MWF or [ ]  TTHS, [ ]  Burning with urination, [ ]  Difficulty urinating Skin: [ ]  Rashes, [ ]  Wounds Psychological: [ ]  Anxiety, [ ]  Depression   Social History History  Substance Use Topics  . Smoking status: Former Smoker -- 1.0 packs/day for 40 years    Types: Cigarettes    Quit date: 03/19/2011  . Smokeless tobacco:  Not on file  . Alcohol Use: No    Family History Family History  Problem Relation Age of Onset  . Arthritis Mother   . Heart disease Mother     Varicose Veins  . Hypertension Mother   . Hyperlipidemia Mother   . Bone cancer Maternal Grandmother   . Coronary artery disease Paternal Grandmother     MI in 50s    No Known Allergies  Current Outpatient Prescriptions  Medication Sig Dispense Refill  . aspirin 325 MG tablet Take 325 mg by mouth daily.        Marland Kitchen lisinopril (PRINIVIL,ZESTRIL) 20 MG tablet TAKE 1 TABLET BY MOUTH DAILY  30 tablet  5  . pravastatin (PRAVACHOL) 20 MG tablet TAKE 1 TABLET BY MOUTH EVERY EVENING  90 tablet  1  . Tamsulosin HCl (FLOMAX) 0.4 MG CAPS Take 0.4 mg by mouth daily.      Marland Kitchen amoxicillin (AMOXIL) 500 MG capsule 4 pills by mouth 1 hour prior to procedure  4 capsule  1  . metroNIDAZOLE (FLAGYL) 500 MG tablet       . traMADol (ULTRAM) 50 MG tablet Take 1 tablet (50 mg total) by mouth every 6 (six) hours as needed for pain.  30 tablet  0  Physical Examination  Filed Vitals:   04/12/12 1030  BP: 136/84  Pulse: 64  Resp:     Body mass index is 26.99 kg/(m^2).  General:  WDWN in NAD Gait: Normal HEENT: WNL Eyes: Pupils equal Pulmonary: normal non-labored breathing , without Rales, rhonchi,  wheezing Cardiac: RRR, without  Murmurs, rubs or gallops; Abdomen: soft, NT, no masses Skin: no rashes, ulcers noted  Vascular Exam Pulses: 3+ radial pulses bilaterally Carotid bruits: Carotid bruit heard on the left, carotid pulse on the right Extremities without ischemic changes, no Gangrene , no cellulitis; no open wounds;  Musculoskeletal: no muscle wasting or atrophy   Neurologic: A&O X 3; Appropriate Affect ; SENSATION: normal; MOTOR FUNCTION:  moving all extremities equally. Speech is fluent/normal  Non-Invasive Vascular Imaging CAROTID DUPLEX 04/12/2012  Right ICA 20 - 39 % stenosis Left ICA 60 - 79 % stenosis   ASSESSMENT/PLAN: Asymptomatic  patient with mild right carotid stenosis, left carotid stenosis in the moderate range which is unchanged from previous exam last year. The patient will followup in 6 months with repeat carotid duplex. The patient knows the signs and symptoms of CVA and knows to report to the nearest emergency department should that occur. The patient's questions were encouraged and answered, he is in agreement with this plan.  Lauree Chandler ANP   Clinic MD: Imogene Burn

## 2012-04-19 NOTE — Procedures (Unsigned)
CAROTID DUPLEX EXAM  INDICATION:  Carotid stenosis.  HISTORY: Diabetes:  No Cardiac:  No Hypertension:  Yes Smoking:  Yes Previous Surgery:  No carotid intervention CV History:  Currently asymptomatic Amaurosis Fugax No, Paresthesias No, Hemiparesis No                                      RIGHT             LEFT Brachial systolic pressure:         184               180 Brachial Doppler waveforms:         Normal            Normal Vertebral direction of flow:        Antegrade         Antegrade DUPLEX VELOCITIES (cm/sec) CCA peak systolic                   61                51 ECA peak systolic                   90                84 ICA peak systolic                   87                239 ICA end diastolic                   32                95 PLAQUE MORPHOLOGY:                  Heterogenous      Calcific PLAQUE AMOUNT:                      Minimal           Moderate PLAQUE LOCATION:                    ICA               ICA/bifurcation  IMPRESSION: 1. Right ICA velocity suggests 1 to 39% stenosis. 2. Left ICA velocity suggests 60 to 79% stenosis; however, Doppler     interrogation is difficult due to acoustic shadowing. 3. Antegrade vertebral arteries bilaterally.  ___________________________________________ Fransisco Hertz, MD  EM/MEDQ  D:  04/12/2012  T:  04/12/2012  Job:  [id number]

## 2012-05-18 ENCOUNTER — Other Ambulatory Visit: Payer: Self-pay | Admitting: Internal Medicine

## 2012-09-06 ENCOUNTER — Other Ambulatory Visit: Payer: Self-pay | Admitting: Urology

## 2012-09-16 ENCOUNTER — Encounter (HOSPITAL_COMMUNITY): Payer: Self-pay | Admitting: Pharmacy Technician

## 2012-09-19 ENCOUNTER — Inpatient Hospital Stay (HOSPITAL_COMMUNITY): Admission: RE | Admit: 2012-09-19 | Payer: Medicare Other | Source: Ambulatory Visit

## 2012-09-19 NOTE — Patient Instructions (Addendum)
20 Brian Hunt  09/19/2012   Your procedure is scheduled on:  09/27/12  FRIDAY  Report to [facility] at    0830   AM.  Call this number if you have problems the morning of surgery: [phone]       Remember:   Do not eat food  Or drink :After Midnight. Thursday NIGHT   Take these medicines the morning of surgery with A SIP OF WATER:   FLOMAX   .  Contacts, dentures or partial plates can not be worn to surgery  Leave suitcase in the car. After surgery it may be brought to your room.  For patients admitted to the hospital, checkout time is 11:00 AM day of  discharge.             SPECIAL INSTRUCTIONS- SEE [place] PREPARING FOR SURGERY INSTRUCTION SHEET-     DO NOT WEAR JEWELRY, LOTIONS, POWDERS, OR PERFUMES.  WOMEN-- DO NOT SHAVE LEGS OR UNDERARMS FOR 12 HOURS BEFORE SHOWERS. MEN MAY SHAVE FACE.  Patients discharged the day of surgery will not be allowed to drive home. IF going home the day of surgery, you must have a driver and someone to stay with you for the first 24 hours  Name and phone number of your driver:    Brian Hunt   wife                                                                     Please read over the following fact sheets that you were given: MRSA Information, Incentive Spirometry Sheet, Blood Transfusion Sheet  Information                                                                                   Brian Hunt  PST 336  [id number]                 FAILURE TO FOLLOW THESE INSTRUCTIONS MAY RESULT IN  CANCELLATION   OF YOUR SURGERY                                                  Patient Signature _____________________________

## 2012-09-20 ENCOUNTER — Encounter (HOSPITAL_COMMUNITY)
Admission: RE | Admit: 2012-09-20 | Discharge: 2012-09-20 | Disposition: A | Discharge status: Home / Self Care | Payer: Medicare HMO | Source: Ambulatory Visit | Attending: Urology | Admitting: Urology

## 2012-09-20 ENCOUNTER — Encounter (HOSPITAL_COMMUNITY): Payer: Self-pay

## 2012-09-20 ENCOUNTER — Ambulatory Visit (HOSPITAL_COMMUNITY)
Admission: RE | Admit: 2012-09-20 | Discharge: 2012-09-20 | Disposition: A | Discharge status: Home / Self Care | Payer: Medicare HMO | Source: Ambulatory Visit | Attending: Urology | Admitting: Urology

## 2012-09-20 DIAGNOSIS — R05 Cough: Secondary | ICD-10-CM | POA: Insufficient documentation

## 2012-09-20 DIAGNOSIS — I4510 Unspecified right bundle-branch block: Secondary | ICD-10-CM | POA: Insufficient documentation

## 2012-09-20 DIAGNOSIS — R0989 Other specified symptoms and signs involving the circulatory and respiratory systems: Secondary | ICD-10-CM | POA: Insufficient documentation

## 2012-09-20 DIAGNOSIS — Z01818 Encounter for other preprocedural examination: Secondary | ICD-10-CM | POA: Insufficient documentation

## 2012-09-20 DIAGNOSIS — R9431 Abnormal electrocardiogram [ECG] [EKG]: Secondary | ICD-10-CM | POA: Insufficient documentation

## 2012-09-20 DIAGNOSIS — R059 Cough, unspecified: Secondary | ICD-10-CM | POA: Insufficient documentation

## 2012-09-20 DIAGNOSIS — Z01812 Encounter for preprocedural laboratory examination: Secondary | ICD-10-CM | POA: Insufficient documentation

## 2012-09-20 DIAGNOSIS — Z01810 Encounter for preprocedural cardiovascular examination: Secondary | ICD-10-CM | POA: Insufficient documentation

## 2012-09-20 HISTORY — DX: Essential (primary) hypertension: I10

## 2012-09-20 HISTORY — DX: Unspecified osteoarthritis, unspecified site: M19.90

## 2012-09-20 HISTORY — DX: Hyperlipidemia, unspecified: E78.5

## 2012-09-20 LAB — BASIC METABOLIC PANEL
Calcium: 9.4 mg/dL (ref 8.4–10.5)
GFR calc Af Amer: >90 mL/min (ref >90)
GFR calc non Af Amer: 84 mL/min — ABNORMAL LOW (ref >90)
Glucose, Bld: 79 mg/dL (ref 70–99)
Sodium: 130 mEq/L — ABNORMAL LOW (ref 135–145)

## 2012-09-20 LAB — CBC
Hemoglobin: 14.1 g/dL (ref 13.0–17.0)
MCH: 31.7 pg (ref 26.0–34.0)
MCHC: 34.5 g/dL (ref 30.0–36.0)
Platelets: 311 10*3/uL (ref 150–400)
RDW: 13.4 % (ref 11.5–15.5)

## 2012-09-20 LAB — SURGICAL PCR SCREEN
MRSA, PCR: NEGATIVE
Staphylococcus aureus: NEGATIVE

## 2012-09-20 NOTE — Progress Notes (Signed)
09/20/12 1041  OBSTRUCTIVE SLEEP APNEA  Have you ever been diagnosed with sleep apnea through a sleep study? No  Do you snore loudly (loud enough to be heard through closed doors)?  0  Do you often feel tired, fatigued, or sleepy during the daytime? 1  Has anyone observed you stop breathing during your sleep? 0  Do you have, or are you being treated for high blood pressure? 1  BMI more than 35 kg/m2? 0  Age over 67 years old? 1  Neck circumference greater than 40 cm/18 inches? 0  Gender: 1  Obstructive Sleep Apnea Score 4   Score 4 or greater  Results sent to PCP

## 2012-09-25 NOTE — H&P (Signed)
History of Present Illness           F/u BPH, elevated PSA, impotence and Peyronies.   Elevated PSA PSA has run 4.5 - 5 for many years. A biopsy done in 2002 was benign. -Jan 2013 PSA 6.29, normal DRE Feb 2013 -Sept 2013 PSA 5.48 -Oct 2013 normal DRE   BPH He had a prior microwave therapy of the prostate. He is on tamsulosin. In Oct 2013, He complained of progressive weak stream. He feels the microwave procedure has "worn off".  About 40% of the time he does not feel like he empties his bladder. He has no frequency or urgency. He has nocturia x 1-2 but drinks plenty of fluids at night. He has had no dysuria or hematuria. He has rare leak with cough, sneeze or strain. No urge incontinence. He was started on tamsulosin.  -Nov 2013 PVR 30 ml. Cystoscopy with trilobar hypertrophy and a median lobe.     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 Penile curvature is stable. He continued Cialis in 2013. Started Trental Nov 2013.     Interval Hx He returns and is doing well on Trental. He has had "more blood flow" to the penis with a better erection.  He continues tamsulosin and is voiding with a good flow. He has nocturia x 0-1 which has improved. He would like to proceed with a prostate procedure. He golfs and gets some syncope or "dizziness when he stands up" and would like to be off medicines.       Past Medical History Problems  1. History of  Arthritis V13.4 2. History of  Benign Prostatic Hypertrophy 600.00 3. History of  Hypertension 401.9  Surgical History Problems  1. History of  Total Hip Replacement Left  Current Meds 1. Lisinopril 20 MG Oral Tablet; TK 1 T PO  D; Therapy: 01Aug2012 to 2. Pentoxifylline ER 400 MG Oral Tablet Extended Release; TAKE 1 TABLET 3 TIMES DAILY AFTER  MEALS; Therapy: 20Nov2013 to (Evaluate:18Jun2014)  Requested for: 20Nov2013; Last  Rx:20Nov2013 3. Pravastatin Sodium 20 MG Oral Tablet; TK 1 T PO   QPM; Therapy: 30Aug2012 to 4. Tamsulosin HCl 0.4 MG Oral Capsule; Take one capsule daily; Therapy: 30Jan2013 to  (Evaluate:27Sep2014)  Requested for: 02Oct2013; Last Rx:02Oct2013 5. Viagra 100 MG Oral Tablet; Take 1 tablet by mouth as needed; Therapy: 31Aug2009 to (Last  Rx:31Aug2009) 6. Zolpidem Tartrate 10 MG Oral Tablet; TK 1/2 TO 1 T PO QHS PRF SLEEP; Therapy: 19Sep2012 to  Allergies Medication  1. Amoxicillin TABS 2. MetroNIDAZOLE CAPS  Family History Problems  1. Family history of  Death In The Family Father 2. Family history of  Death In The Family Mother 3. Family history of  Family Health Status Number Of Children 1 son and 1 daughter  Social History Problems  1. Caffeine Use 2. Marital History - Currently Married 3. Occupation: Runner, broadcasting/film/video 4. Tobacco Use V15.82 Denied  5. History of  Alcohol Use  Review of Systems Constitutional, cardiovascular, pulmonary and neurological system(s) were reviewed and pertinent findings if present are noted.  Musculoskeletal: joint pain.    Vitals Vital Signs [Data Includes: Last 1 Day]  18Dec2013 10:38AM  Blood Pressure: 125 / 80 Temperature: 96.7 F Heart Rate: 70  Physical Exam Constitutional: Well nourished and well developed . No acute distress.  Pulmonary: No respiratory distress and normal respiratory rhythm and effort.  Cardiovascular: Heart rate and rhythm are normal . No peripheral edema.  Neuro/Psych:. Mood and affect  are appropriate.    Results/Data Urine [Data Includes: Last 1 Day]   18Dec2013  COLOR YELLOW   APPEARANCE CLEAR   SPECIFIC GRAVITY 1.020   pH 6.0   GLUCOSE NEG mg/dL  BILIRUBIN NEG   KETONE NEG mg/dL  BLOOD NEG   PROTEIN NEG mg/dL  UROBILINOGEN 0.2 mg/dL  NITRITE NEG   LEUKOCYTE ESTERASE NEG    Assessment Assessed  1. Benign Prostatic Hyperplasia Localized With Urinary Obstruction With Other Lower Urinary Tract  Symptoms 600.21  Plan Benign Prostatic Hyperplasia Localized With Urinary  Obstruction With Other Lower Urinary Tract Symptoms (600.21)  1. Follow-up Schedule Surgery Office  Follow-up  Requested for: 18Dec2013 Health Maintenance (V70.0)  2. UA With REFLEX  Done: 18Dec2013 10:28AM  Discussion/Summary  We went over again the nature, R/B/A of Greenlight PVP for BPH. All questions answered. He would like to proceed.     Signatures Electronically signed by : Jerilee Field, M.D.; Sep 04 2012 11:44AM

## 2012-09-27 ENCOUNTER — Encounter (HOSPITAL_COMMUNITY): Payer: Self-pay | Admitting: Anesthesiology

## 2012-09-27 ENCOUNTER — Encounter (HOSPITAL_COMMUNITY): Payer: Self-pay | Admitting: *Deleted

## 2012-09-27 ENCOUNTER — Encounter (HOSPITAL_COMMUNITY)
Admission: RE | Disposition: A | Discharge status: Home / Self Care | Payer: Self-pay | Source: Ambulatory Visit | Attending: Urology

## 2012-09-27 ENCOUNTER — Ambulatory Visit (HOSPITAL_COMMUNITY): Payer: Medicare HMO | Admitting: Anesthesiology

## 2012-09-27 ENCOUNTER — Ambulatory Visit (HOSPITAL_COMMUNITY)
Admission: RE | Admit: 2012-09-27 | Discharge: 2012-09-27 | Disposition: A | Discharge status: Home / Self Care | Payer: Medicare HMO | Source: Ambulatory Visit | Attending: Urology | Admitting: Urology

## 2012-09-27 DIAGNOSIS — N400 Enlarged prostate without lower urinary tract symptoms: Secondary | ICD-10-CM

## 2012-09-27 DIAGNOSIS — N529 Male erectile dysfunction, unspecified: Secondary | ICD-10-CM | POA: Insufficient documentation

## 2012-09-27 DIAGNOSIS — N486 Induration penis plastica: Secondary | ICD-10-CM | POA: Insufficient documentation

## 2012-09-27 DIAGNOSIS — I10 Essential (primary) hypertension: Secondary | ICD-10-CM | POA: Insufficient documentation

## 2012-09-27 DIAGNOSIS — Z79899 Other long term (current) drug therapy: Secondary | ICD-10-CM | POA: Insufficient documentation

## 2012-09-27 DIAGNOSIS — Z96649 Presence of unspecified artificial hip joint: Secondary | ICD-10-CM | POA: Insufficient documentation

## 2012-09-27 DIAGNOSIS — N401 Enlarged prostate with lower urinary tract symptoms: Secondary | ICD-10-CM | POA: Insufficient documentation

## 2012-09-27 HISTORY — PX: GREEN LIGHT LASER TURP (TRANSURETHRAL RESECTION OF PROSTATE: SHX6260

## 2012-09-27 SURGERY — GREEN LIGHT LASER TURP (TRANSURETHRAL RESECTION OF PROSTATE
Anesthesia: General | Wound class: Clean Contaminated

## 2012-09-27 MED ORDER — FENTANYL CITRATE 0.05 MG/ML IJ SOLN
INTRAMUSCULAR | Status: AC
  Filled 2012-09-27: qty 2

## 2012-09-27 MED ORDER — PROPOFOL 10 MG/ML IV EMUL
INTRAVENOUS | Status: DC | PRN
  Administered 2012-09-27: 200 mg via INTRAVENOUS

## 2012-09-27 MED ORDER — ACETAMINOPHEN 10 MG/ML IV SOLN
INTRAVENOUS | Status: DC | PRN
  Administered 2012-09-27: 1000 mg via INTRAVENOUS

## 2012-09-27 MED ORDER — FENTANYL CITRATE 0.05 MG/ML IJ SOLN
25.0000 ug | INTRAMUSCULAR | Status: DC | PRN
  Administered 2012-09-27 (×3): 50 ug via INTRAVENOUS

## 2012-09-27 MED ORDER — ACETAMINOPHEN 10 MG/ML IV SOLN
INTRAVENOUS | Status: AC
  Filled 2012-09-27: qty 100

## 2012-09-27 MED ORDER — PROMETHAZINE HCL 25 MG/ML IJ SOLN: 6.2500 mg | INTRAMUSCULAR | Status: DC | PRN

## 2012-09-27 MED ORDER — CIPROFLOXACIN HCL 250 MG PO TABS: 500.0000 mg | ORAL_TABLET | Freq: Every day | ORAL | Status: DC

## 2012-09-27 MED ORDER — MIDAZOLAM HCL 5 MG/5ML IJ SOLN
INTRAMUSCULAR | Status: DC | PRN
  Administered 2012-09-27 (×2): 0.5 mg via INTRAVENOUS
  Administered 2012-09-27: 1 mg via INTRAVENOUS

## 2012-09-27 MED ORDER — OXYCODONE-ACETAMINOPHEN 5-325 MG PO TABS: 1.0000 | ORAL_TABLET | ORAL | Status: DC | PRN

## 2012-09-27 MED ORDER — SODIUM CHLORIDE 0.9 % IR SOLN
Status: DC | PRN
  Administered 2012-09-27: 19000 mL via INTRAVESICAL

## 2012-09-27 MED ORDER — ASPIRIN 325 MG PO TABS: 325.0000 mg | ORAL_TABLET | Freq: Every day | ORAL | Status: DC

## 2012-09-27 MED ORDER — LACTATED RINGERS IV SOLN
INTRAVENOUS | Status: DC
  Administered 2012-09-27: 1000 mL via INTRAVENOUS
  Administered 2012-09-27: 13:00:00 via INTRAVENOUS

## 2012-09-27 MED ORDER — FENTANYL CITRATE 0.05 MG/ML IJ SOLN
INTRAMUSCULAR | Status: DC | PRN
  Administered 2012-09-27: 75 ug via INTRAVENOUS
  Administered 2012-09-27: 25 ug via INTRAVENOUS
  Administered 2012-09-27: 50 ug via INTRAVENOUS
  Administered 2012-09-27 (×2): 25 ug via INTRAVENOUS

## 2012-09-27 MED ORDER — MEPERIDINE HCL 50 MG/ML IJ SOLN: 6.2500 mg | INTRAMUSCULAR | Status: DC | PRN

## 2012-09-27 MED ORDER — EPHEDRINE SULFATE 50 MG/ML IJ SOLN
INTRAMUSCULAR | Status: DC | PRN
  Administered 2012-09-27: 10 mg via INTRAVENOUS

## 2012-09-27 MED ORDER — LACTATED RINGERS IV SOLN: INTRAVENOUS | Status: DC

## 2012-09-27 MED ORDER — GLYCOPYRROLATE 0.2 MG/ML IJ SOLN
INTRAMUSCULAR | Status: DC | PRN
  Administered 2012-09-27: 0.6 mg via INTRAVENOUS

## 2012-09-27 MED ORDER — CEFAZOLIN SODIUM-DEXTROSE 2-3 GM-% IV SOLR
INTRAVENOUS | Status: AC
  Filled 2012-09-27: qty 50

## 2012-09-27 MED ORDER — NEOSTIGMINE METHYLSULFATE 1 MG/ML IJ SOLN
INTRAMUSCULAR | Status: DC | PRN
  Administered 2012-09-27: 4 mg via INTRAVENOUS

## 2012-09-27 MED ORDER — LIDOCAINE HCL (CARDIAC) 20 MG/ML IV SOLN
INTRAVENOUS | Status: DC | PRN
  Administered 2012-09-27: 40 mg via INTRAVENOUS

## 2012-09-27 MED ORDER — PHENAZOPYRIDINE HCL 200 MG PO TABS: 200.0000 mg | ORAL_TABLET | Freq: Three times a day (TID) | ORAL | Status: DC | PRN

## 2012-09-27 MED ORDER — CEFAZOLIN SODIUM-DEXTROSE 2-3 GM-% IV SOLR
2.0000 g | INTRAVENOUS | Status: AC
  Administered 2012-09-27: 2 g via INTRAVENOUS

## 2012-09-27 MED ORDER — HYOSCYAMINE SULFATE 0.125 MG SL SUBL: 0.1250 mg | SUBLINGUAL_TABLET | SUBLINGUAL | Status: DC | PRN

## 2012-09-27 MED ORDER — CISATRACURIUM BESYLATE (PF) 10 MG/5ML IV SOLN
INTRAVENOUS | Status: DC | PRN
  Administered 2012-09-27: 5 mg via INTRAVENOUS
  Administered 2012-09-27: 3 mg via INTRAVENOUS

## 2012-09-27 MED ORDER — ESMOLOL HCL 10 MG/ML IV SOLN
INTRAVENOUS | Status: DC | PRN
  Administered 2012-09-27 (×2): 20 mg via INTRAVENOUS

## 2012-09-27 MED ORDER — SUCCINYLCHOLINE CHLORIDE 20 MG/ML IJ SOLN
INTRAMUSCULAR | Status: DC | PRN
  Administered 2012-09-27: 100 mg via INTRAVENOUS

## 2012-09-27 MED ORDER — HYDROMORPHONE HCL PF 1 MG/ML IJ SOLN
INTRAMUSCULAR | Status: DC | PRN
  Administered 2012-09-27: 0.5 mg via INTRAVENOUS
  Administered 2012-09-27: 1 mg via INTRAVENOUS
  Administered 2012-09-27: 0.5 mg via INTRAVENOUS

## 2012-09-27 MED ORDER — ONDANSETRON HCL 4 MG/2ML IJ SOLN
INTRAMUSCULAR | Status: DC | PRN
  Administered 2012-09-27 (×2): 2 mg via INTRAVENOUS

## 2012-09-27 SURGICAL SUPPLY — 22 items
BAG URINE DRAINAGE (UROLOGICAL SUPPLIES) ×2 IMPLANT
BAG URO CATCHER STRL LF (DRAPE) ×2 IMPLANT
CATH FOLEY 2WAY SLVR  5CC 18FR (CATHETERS)
CATH FOLEY 2WAY SLVR 5CC 18FR (CATHETERS) IMPLANT
CATH TIEMANN FOLEY 18FR 5CC (CATHETERS) ×1 IMPLANT
CLOTH BEACON ORANGE TIMEOUT ST (SAFETY) ×2 IMPLANT
DRAPE CAMERA CLOSED 9X96 (DRAPES) ×2 IMPLANT
ELECT BUTTON HF 24-28F 2 30DE (ELECTRODE) IMPLANT
ELECT LOOP MED HF 24F 12D (CUTTING LOOP) IMPLANT
ELECT LOOP MED HF 24F 12D CBL (CLIP) IMPLANT
ELECT RESECT VAPORIZE 12D CBL (ELECTRODE) IMPLANT
FEE RENTAL LASER GREENLIGHT (Laser) IMPLANT
GLOVE BIOGEL M STRL SZ7.5 (GLOVE) ×2 IMPLANT
GOWN STRL REIN XL XLG (GOWN DISPOSABLE) ×2 IMPLANT
HOLDER FOLEY CATH W/STRAP (MISCELLANEOUS) IMPLANT
LASER FIBER /GREENLIGHT LASER (Laser) ×1 IMPLANT
LASER GREENLIGHT RENTAL P/PROC (Laser) ×2 IMPLANT
MANIFOLD NEPTUNE II (INSTRUMENTS) ×2 IMPLANT
PACK CYSTO (CUSTOM PROCEDURE TRAY) ×2 IMPLANT
SYR 30ML LL (SYRINGE) IMPLANT
SYRINGE IRR TOOMEY STRL 70CC (SYRINGE) IMPLANT
TUBING CONNECTING 10 (TUBING) ×2 IMPLANT

## 2012-09-27 NOTE — Transfer of Care (Signed)
Immediate Anesthesia Transfer of Care Note  Patient: Brian Hunt  Procedure(s) Performed: Procedure(s) (LRB) with comments: GREEN LIGHT LASER TURP (TRANSURETHRAL RESECTION OF PROSTATE (N/A) -    Patient Location: PACU  Anesthesia Type:General  Level of Consciousness: awake, alert , oriented and patient cooperative  Airway & Oxygen Therapy: Patient Spontanous Breathing and Patient connected to face mask oxygen  Post-op Assessment: Report given to PACU RN and Post -op Vital signs reviewed and stable  Post vital signs: Reviewed and stable  Complications: No apparent anesthesia complications

## 2012-09-27 NOTE — Anesthesia Preprocedure Evaluation (Addendum)
Anesthesia Evaluation  Patient identified by MRN, date of birth, ID band Patient awake    Reviewed: Allergy & Precautions, H&P , NPO status , Patient's Chart, lab work & pertinent test results  Airway Mallampati: II TM Distance: >3 FB Neck ROM: Full    Dental No notable dental hx.    Pulmonary neg pulmonary ROS, COPDCurrent Smoker,  breath sounds clear to auscultation  Pulmonary exam normal       Cardiovascular hypertension, Pt. on medications + Peripheral Vascular Disease negative cardio ROS  Rhythm:Regular Rate:Normal     Neuro/Psych negative neurological ROS  negative psych ROS   GI/Hepatic negative GI ROS, Neg liver ROS,   Endo/Other  negative endocrine ROS  Renal/GU negative Renal ROS  negative genitourinary   Musculoskeletal negative musculoskeletal ROS (+)   Abdominal   Peds negative pediatric ROS (+)  Hematology negative hematology ROS (+)   Anesthesia Other Findings   Reproductive/Obstetrics negative OB ROS                          Anesthesia Physical Anesthesia Plan  ASA: III  Anesthesia Plan: General   Post-op Pain Management:    Induction: Intravenous  Airway Management Planned: Oral ETT and LMA  Additional Equipment:   Intra-op Plan:   Post-operative Plan: Extubation in OR  Informed Consent: I have reviewed the patients History and Physical, chart, labs and discussed the procedure including the risks, benefits and alternatives for the proposed anesthesia with the patient or authorized representative who has indicated his/her understanding and acceptance.   Dental advisory given  Plan Discussed with: CRNA  Anesthesia Plan Comments:         Anesthesia Quick Evaluation

## 2012-09-27 NOTE — Progress Notes (Signed)
Educated on foley catheter care. Instructed how to empty drainage bag and removal of catheter with syringe. Patient and patient's spouse verbalized understanding. Patient able to walk down hall and stated he felt better when up and moving. Stated catheter was aggravating and uncomfortable, but otherwise pain level was okay. Instructed to call Dr. Mena Goes office for any problems.

## 2012-09-27 NOTE — Interval H&P Note (Signed)
History and Physical Interval Note:  09/27/2012 10:48 AM  Brian Hunt  has presented today for surgery, with the diagnosis of Benign Prostatic Hyperplasia  The various methods of treatment have been discussed with the patient and family. After consideration of risks, benefits and other options for treatment, the patient has consented to  Procedure(s) (LRB) with comments: GREEN LIGHT LASER TURP (TRANSURETHRAL RESECTION OF PROSTATE (N/A) -   as a surgical intervention .  The patient's history has been reviewed, patient examined, no change in status, stable for surgery.  I have reviewed the patient's chart and labs.  We discussed side effects of Greenlight PVP, the likelihood of the patient achieving the goals of the procedure, and any potential problems that might occur during the procedure or recuperation. We discussed improvement in flow rate in most cases, improvement in irritative voiding symptoms (frequency, urgency) to a lesser degree and to a lesser extent worsening or de novo irritative voiding symptoms among other risks. Questions were answered to the patient's satisfaction.  He elects to proceed.    [name]

## 2012-09-27 NOTE — Anesthesia Postprocedure Evaluation (Signed)
  Anesthesia Post-op Note  Patient: Brian Hunt  Procedure(s) Performed: Procedure(s) (LRB): GREEN LIGHT LASER TURP (TRANSURETHRAL RESECTION OF PROSTATE (N/A)  Patient Location: PACU  Anesthesia Type: General  Level of Consciousness: awake and alert   Airway and Oxygen Therapy: Patient Spontanous Breathing  Post-op Pain: mild  Post-op Assessment: Post-op Vital signs reviewed, Patient's Cardiovascular Status Stable, Respiratory Function Stable, Patent Airway and No signs of Nausea or vomiting  Last Vitals:  Filed Vitals:   09/27/12 1445  BP:   Pulse: 52  Temp:   Resp: 11    Post-op Vital Signs: stable   Complications: No apparent anesthesia complications

## 2012-09-27 NOTE — Op Note (Signed)
Preoperative diagnosis: BPH Postoperative diagnosis: BPH  Procedure: Greenlight photo vaporization of prostate  Surgeon: Mena Goes  Anesthesia: Gen.  Findings: On exam under anesthesia patient had a normal penis and testicles without palpable mass or nodule. On digital rectal exam the prostate was approximately 50 g and noted to be asymmetrically enlarged, smooth and without heart area or nodule. There were no lower abdominal masses. Cystoscopy-the urethra was normal. The prostatic urethra showed some lateral lobe hypertrophy but primarily a high median bar and some median lobe tissue. There was 100% visual obstruction. The bladder was mildly trabeculated without lesion or mass. The trigone and ureteral orifice these were normal pre-and post vaporization and in their normal orthotopic position.  Description of procedure: After consent was obtained patient brought to the operating room. After adequate anesthesia he is placed in lithotomy position. Timeout was performed to confirm the patient and procedure. An exam under anesthesia was performed. The patient was prepped and draped in the usual fashion. The cystoscope was passed per urethra and the bladder examined in its entirety. The laser fiber was passed and the lateral lobes were vaporized from the bladder neck to the veru. After creating a channel having more working room with the lateral lobes taken down after my attention to the bladder neck. With power and 80 the bladder neck tissue was vaporized at 5 and 7:00 to create grooves down to the prostatic capsule and this was taken down toward the veru.  At no time did vaporization extend past the the veru. Next the median lobe tissue was vaporized by aiming the laser medially and anteriorly away from the prostatic capsule and bladder. This created a nice channel from the bladder neck down to the veru therefore the procedure was terminated. Adequate hemostasis was obtained. The bladder was inspected as  well as the trigone and ureteral orifice these and noted to be normal. The scope was removed and an 18 French Foley placed and left gravity drainage. The patient was awakened taken to room in stable condition.  Complications: None  Blood loss: Minimal  Drains: 18 French Foley  Specimens: None  Disposition: Patient stable to PACU

## 2012-10-01 ENCOUNTER — Encounter (HOSPITAL_COMMUNITY): Payer: Self-pay | Admitting: Urology

## 2012-10-17 ENCOUNTER — Encounter: Payer: Self-pay | Admitting: Neurosurgery

## 2012-10-18 ENCOUNTER — Ambulatory Visit (INDEPENDENT_AMBULATORY_CARE_PROVIDER_SITE_OTHER): Payer: Medicare HMO | Admitting: Vascular Surgery

## 2012-10-18 ENCOUNTER — Encounter: Payer: Self-pay | Admitting: Neurosurgery

## 2012-10-18 ENCOUNTER — Ambulatory Visit (INDEPENDENT_AMBULATORY_CARE_PROVIDER_SITE_OTHER): Payer: Medicare HMO | Admitting: Neurosurgery

## 2012-10-18 VITALS — BP 134/83 | HR 64 | Resp 16 | Ht 68.5 in | Wt 172.0 lb

## 2012-10-18 DIAGNOSIS — I6529 Occlusion and stenosis of unspecified carotid artery: Secondary | ICD-10-CM

## 2012-10-18 NOTE — Addendum Note (Signed)
Addended by: Sharee Pimple on: 10/18/2012 02:51 PM   Modules accepted: Orders

## 2012-10-18 NOTE — Progress Notes (Signed)
[facility] Carotid Office Note  CC: Carotid surveillance Referring Physician: Imogene Burn  History of Present Illness: 67 year old male patient of Dr. Imogene Burn followed for known carotid stenosis. The patient denies any signs or symptoms of CVA, TIA, amaurosis fugax or any neural deficit. The patient denies any new medical diagnoses or recent surgery.  Past Medical History  Diagnosis Date  . Bursitis of left hip     Dr. Constance Goltz  . 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 Prostatitis     Dr Vonita Moss  . Elevated PSA   . COPD (chronic obstructive pulmonary disease)   . Hypertension   . Hyperlipidemia   . Arthritis   . Carotid artery occlusion     ROS: [x]  Positive   [ ]  Denies    General: [ ]  Weight loss, [ ]  Fever, [ ]  chills Neurologic: [ ]  Dizziness, [ ]  Blackouts, [ ]  Seizure [ ]  Stroke, [ ]  "Mini stroke", [ ]  Slurred speech, [ ]  Temporary blindness; [ ]  weakness in arms or legs, [ ]  Hoarseness Cardiac: [ ]  Chest pain/pressure, [ ]  Shortness of breath at rest [ ]  Shortness of breath with exertion, [ ]  Atrial fibrillation or irregular heartbeat Vascular: [ ]  Pain in legs with walking, [ ]  Pain in legs at rest, [ ]  Pain in legs at night,  [ ]  Non-healing ulcer, [ ]  Blood clot in vein/DVT,   Pulmonary: [ ]  Home oxygen, [ ]  Productive cough, [ ]  Coughing up blood, [ ]  Asthma,  [x ] Wheezing Musculoskeletal:  [ ]  Arthritis, [ ]  Low back pain, [ ]  Joint pain Hematologic: [ ]  Easy Bruising, [ ]  Anemia; [ ]  Hepatitis Gastrointestinal: [ ]  Blood in stool, [ ]  Gastroesophageal Reflux/heartburn, [ ]  Trouble swallowing Urinary: [ ]  chronic Kidney disease, [ ]  on HD - [ ]  MWF or [ ]  TTHS, [ ]  Burning with urination, [ ]  Difficulty urinating Skin: [ ]  Rashes, [ ]  Wounds Psychological: [ ]  Anxiety, [ ]  Depression   Social History History  Substance Use Topics  . Smoking status: Current Every Day Smoker -- 0.2 packs/day for 40 years    Types: Cigarettes  . Smokeless  tobacco: Never Used  . Alcohol Use: No     Comment: 2001- states no rehab    Family History Family History  Problem Relation Age of Onset  . Arthritis Mother   . Heart disease Mother     Varicose Veins  . Hypertension Mother   . Hyperlipidemia Mother   . Bone cancer Maternal Grandmother   . Coronary artery disease Paternal Grandmother     MI in 17s    No Known Allergies  Current Outpatient Prescriptions  Medication Sig Dispense Refill  . aspirin 325 MG tablet Take 1 tablet (325 mg total) by mouth daily.      . B Complex-C (B-COMPLEX WITH VITAMIN C) tablet Take 1 tablet by mouth daily.      Marland Kitchen lisinopril (PRINIVIL,ZESTRIL) 20 MG tablet Take 20 mg by mouth every evening.      . pentoxifylline (TRENTAL) 400 MG CR tablet Take 400 mg by mouth 3 (three) times daily with meals.      . pravastatin (PRAVACHOL) 20 MG tablet Take 20 mg by mouth every evening.      . Tamsulosin HCl (FLOMAX) 0.4 MG CAPS Take 0.4 mg by mouth daily after breakfast.       . ciprofloxacin (CIPRO) 250 MG  tablet Take 2 tablets (500 mg total) by mouth daily.  6 tablet  0  . hyoscyamine (LEVSIN SL) 0.125 MG SL tablet Place 1 tablet (0.125 mg total) under the tongue every 4 (four) hours as needed for cramping.  16 tablet  0  . oxyCODONE-acetaminophen (ROXICET) 5-325 MG per tablet Take 1 tablet by mouth every 4 (four) hours as needed for pain.  30 tablet  0  . phenazopyridine (PYRIDIUM) 200 MG tablet Take 1 tablet (200 mg total) by mouth 3 (three) times daily as needed for pain.  10 tablet  0  . psyllium (REGULOID) 0.52 G capsule Take 0.52 g by mouth 2 (two) times daily.         Physical Examination  Filed Vitals:   10/18/12 1414  BP: 134/83  Pulse: 64  Resp:     Body mass index is 25.77 kg/(m^2).  General:  WDWN in NAD Gait: Normal HEENT: WNL Eyes: Pupils equal Pulmonary: normal non-labored breathing , without Rales, rhonchi,  wheezing Cardiac: RRR, without  Murmurs, rubs or gallops; Abdomen: soft, NT,  no masses Skin: no rashes, ulcers noted  Vascular Exam Pulses: 3+ radial pulses bilaterally Carotid bruits: Carotid pulses to auscultation no bruits are heard Extremities without ischemic changes, no Gangrene , no cellulitis; no open wounds;  Musculoskeletal: no muscle wasting or atrophy   Neurologic: A&O X 3; Appropriate Affect ; SENSATION: normal; MOTOR FUNCTION:  moving all extremities equally. Speech is fluent/normal  Non-Invasive Vascular Imaging CAROTID DUPLEX 10/18/2012  Right ICA 20 - 39 % stenosis Left ICA 60 - 79 % stenosis   ASSESSMENT/PLAN: Asymptomatic patient with a stable carotid duplex from 6 months ago. The patient will followup in 6 months with repeat carotid duplex, his questions were encouraged and answered, he is in agreement with this plan.  Lauree Chandler ANP   Clinic MD: Imogene Burn

## 2012-10-18 NOTE — Progress Notes (Signed)
Carotid duplex performed @ [facility] 10/18/2012

## 2012-11-02 ENCOUNTER — Other Ambulatory Visit: Payer: Self-pay

## 2012-11-08 ENCOUNTER — Other Ambulatory Visit: Payer: Self-pay | Admitting: Internal Medicine

## 2012-12-08 ENCOUNTER — Other Ambulatory Visit: Payer: Self-pay | Admitting: Internal Medicine

## 2012-12-09 NOTE — Telephone Encounter (Signed)
Lipid/Hep 272.4/995.20, appt with Hopp 3-5 days later

## 2012-12-20 ENCOUNTER — Other Ambulatory Visit: Payer: Self-pay | Admitting: Internal Medicine

## 2012-12-20 NOTE — Telephone Encounter (Signed)
Schedule CPX 

## 2012-12-25 ENCOUNTER — Ambulatory Visit (INDEPENDENT_AMBULATORY_CARE_PROVIDER_SITE_OTHER): Payer: Medicare HMO | Admitting: Internal Medicine

## 2012-12-25 ENCOUNTER — Encounter: Payer: Self-pay | Admitting: Internal Medicine

## 2012-12-25 VITALS — BP 126/82 | HR 53 | Wt 170.0 lb

## 2012-12-25 DIAGNOSIS — R7309 Other abnormal glucose: Secondary | ICD-10-CM

## 2012-12-25 DIAGNOSIS — R739 Hyperglycemia, unspecified: Secondary | ICD-10-CM | POA: Insufficient documentation

## 2012-12-25 DIAGNOSIS — H3530 Unspecified macular degeneration: Secondary | ICD-10-CM | POA: Insufficient documentation

## 2012-12-25 DIAGNOSIS — E785 Hyperlipidemia, unspecified: Secondary | ICD-10-CM | POA: Insufficient documentation

## 2012-12-25 DIAGNOSIS — I10 Essential (primary) hypertension: Secondary | ICD-10-CM | POA: Insufficient documentation

## 2012-12-25 LAB — HEPATIC FUNCTION PANEL
ALT: 19 U/L (ref 0–53)
AST: 25 U/L (ref 0–37)
Alkaline Phosphatase: 67 U/L (ref 39–117)
Bilirubin, Direct: 0 mg/dL (ref 0.0–0.3)
Total Protein: 7.6 g/dL (ref 6.0–8.3)

## 2012-12-25 LAB — BASIC METABOLIC PANEL
BUN: 14 mg/dL (ref 6–23)
Creatinine, Ser: 0.8 mg/dL (ref 0.4–1.5)
GFR: 107.27 mL/min (ref >60.00)
Glucose, Bld: 86 mg/dL (ref 70–99)
Potassium: 4.8 mEq/L (ref 3.5–5.1)

## 2012-12-25 MED ORDER — LISINOPRIL 20 MG PO TABS: ORAL_TABLET | ORAL | Status: DC

## 2012-12-25 MED ORDER — PRAVASTATIN SODIUM 20 MG PO TABS: ORAL_TABLET | ORAL | Status: DC

## 2012-12-25 NOTE — Assessment & Plan Note (Signed)
Blood pressure control appears to be good; this apparently has improved with his exercise program.

## 2012-12-25 NOTE — Patient Instructions (Addendum)
Preventive Health Care: Exercise at least 30-45 minutes a day,  3-4 days a week.  Eat a low-fat diet with lots of fruits and vegetables, up to 7-9 servings per day. This would eliminate the need for vitamin supplements. Consume less than 40 grams of sugar (preferably ZERO) per day from foods & drinks with High Fructose Corn Sugar as #1,2,3 or # 4 on label. Minimal Blood Pressure Goal= AVERAGE < 140/90;  Ideal is an AVERAGE < 135/85. This AVERAGE should be calculated from @ least 5-7 BP readings taken @ different times of day on different days of week. You should not respond to isolated BP readings , but rather the AVERAGE for that week .Please bring your  blood pressure cuff to office visits to verify that it is reliable.It  can also be checked against the blood pressure device at the pharmacy. Finger or wrist cuffs are not dependable; an arm cuff is. Please think about quitting smoking. Review the risks we discussed. Please call 1-800-QUIT-NOW (936-592-0718) for free smoking cessation counseling. Review and correct the record as indicated. Please share record with all medical staff seen.

## 2012-12-25 NOTE — Assessment & Plan Note (Signed)
NMR LipoProfile will be performed to optimally determine long-term risk and medication indications

## 2012-12-25 NOTE — Assessment & Plan Note (Signed)
A1c will be drawn to assess long-term diabetic risk. As noted triglycerides are extremely low

## 2012-12-25 NOTE — Progress Notes (Signed)
  Subjective:    Patient ID: Brian Hunt, male    DOB: 1946/08/21, 67 y.o.   MRN: [id number]  HPI The patient is here for followup of ,hyperlipidemia, hypertension & PMH of mild hyperglycemia . The most recent A1c 5/12 was 6.1% , which correlates to an average sugar of  129, and long-term risk of 22 % . Fasting blood sugar not monitored .  Last ophthalmologic examination 3/14  revealed no retinopathy but stable Macular degeneration. Modified heart healthy diet Exercise  2-3 X/ week as  CVE for 45 minutes.  The most recent lipids 5/12  reveal LDL 98 ; HDL 65.4 ; and triglycerides 23  . There is medical compliance with the statin. Blood pressure range 135-140/80-85. Marland Kitchen There is medical  compliance with antihypertensive medications  No significant  medication adverse effects suggested .Occasional postural lightheadedness.        Review of Systems Constitutional: No  significant weight change;  excess fatigue Eyes: No  blurred vision;  double vision ; loss of vision Cardiovascular: no chest pain ;palpitations; racing; irregular rhythm ;syncope; claudication ; edema  Respiratory: No exertional dyspnea;  paroxysmal nocturnal dyspnea Genitourinary:hematuria S/P laser TURP ; resolved as of early March Musculoskeletal: No myalgias or muscle cramping  Dermatologic: No non healing lesions; change in color or temperature of skin Neurologic: No  limb weakness;  numbness or tingling;  burning Endocrine: No change in hair, skin, nails. No excessive thirst; excessive hunger;  excessive urination     Objective:   Physical Exam  Gen.: Well-nourished in appearance. Alert, appropriate and cooperative throughout exam. Head: Normocephalic without obvious abnormalities  Eyes: No corneal or conjunctival inflammation noted.  Mouth: Oral mucosa and oropharynx reveal no lesions or exudates. Teeth in good repair. Neck: No deformities, masses, or tenderness noted.  Thyroid normal. Lungs: Normal  respiratory effort; chest expands symmetrically. Lungs are clear to auscultation without rales, wheezes, or increased work of breathing.Decreased BS Heart: Normal rate and rhythm. Normal S1 and S2. No gallop, click, or rub. S4 with slurring at  LSB Abdomen: Bowel sounds normal; abdomen soft and nontender. No masses, organomegaly or hernias noted.No AAA or renal artery bruits                             Musculoskeletal/extremities: There is some asymmetry of the posterior thoracic musculature suggesting occult scoliosis. No clubbing, cyanosis, edema, or significant extremity  deformity noted. Tone & strength  Normal. Joints normal. Nail health good. Vascular: Carotid, radial artery, dorsalis pedis and  posterior tibial pulses are full and equal. No significant carotid bruits present. Neurologic: Alert and oriented x3. Deep tendon reflexes symmetrical and 1/2+       Skin: Intact without suspicious lesions or rashes. Lymph: No cervical, axillary lymphadenopathy present. Psych: Mood and affect are normal. Normally interactive                                                                                        Assessment & Plan:

## 2013-01-03 ENCOUNTER — Encounter: Payer: Self-pay | Admitting: Internal Medicine

## 2013-01-09 ENCOUNTER — Encounter: Payer: Self-pay | Admitting: Internal Medicine

## 2013-01-10 ENCOUNTER — Other Ambulatory Visit: Payer: Self-pay | Admitting: Internal Medicine

## 2013-01-10 NOTE — Telephone Encounter (Signed)
Rx was filled for #[phone] with 3 refills on 12/25/12.

## 2013-01-13 ENCOUNTER — Encounter: Payer: Self-pay | Admitting: Internal Medicine

## 2013-04-16 ENCOUNTER — Encounter: Payer: Self-pay | Admitting: Internal Medicine

## 2013-04-23 ENCOUNTER — Other Ambulatory Visit: Payer: Self-pay

## 2013-04-25 ENCOUNTER — Other Ambulatory Visit (INDEPENDENT_AMBULATORY_CARE_PROVIDER_SITE_OTHER): Payer: Medicare HMO | Admitting: *Deleted

## 2013-04-25 ENCOUNTER — Ambulatory Visit: Payer: Medicare HMO | Admitting: Neurosurgery

## 2013-04-25 DIAGNOSIS — I6529 Occlusion and stenosis of unspecified carotid artery: Secondary | ICD-10-CM

## 2013-04-28 ENCOUNTER — Other Ambulatory Visit: Payer: Self-pay | Admitting: *Deleted

## 2013-04-30 ENCOUNTER — Encounter: Payer: Self-pay | Admitting: Vascular Surgery

## 2013-05-14 ENCOUNTER — Ambulatory Visit: Payer: Medicare HMO | Admitting: Internal Medicine

## 2013-05-28 ENCOUNTER — Telehealth: Payer: Self-pay

## 2013-05-28 NOTE — Telephone Encounter (Signed)
Patient called back to let me know he did not have access to a fax machine so I could send his release but that if he were on this side of town in the next week he would stop by to sign it.

## 2013-05-28 NOTE — Telephone Encounter (Signed)
LM on VM asking patient to call me if he had a fax number I could use to fax him a release to sign so I could try to get records from [facility] before patient's appointment.

## 2013-06-09 ENCOUNTER — Ambulatory Visit (INDEPENDENT_AMBULATORY_CARE_PROVIDER_SITE_OTHER): Payer: Medicare HMO | Admitting: Internal Medicine

## 2013-06-09 ENCOUNTER — Encounter: Payer: Self-pay | Admitting: Internal Medicine

## 2013-06-09 VITALS — BP 150/86 | HR 72 | Ht 66.25 in | Wt 171.0 lb

## 2013-06-09 DIAGNOSIS — K649 Unspecified hemorrhoids: Secondary | ICD-10-CM

## 2013-06-09 DIAGNOSIS — K5730 Diverticulosis of large intestine without perforation or abscess without bleeding: Secondary | ICD-10-CM

## 2013-06-09 MED ORDER — PRAMOXINE-HC 1-1 % EX CREA: TOPICAL_CREAM | Freq: Three times a day (TID) | CUTANEOUS | Status: DC

## 2013-06-09 NOTE — Progress Notes (Signed)
HISTORY OF PRESENT ILLNESS:  Brian Hunt is a 67 y.o. male with the below listed medical history who presents today regarding management of hemorrhoids. Patient has had problems with hemorrhoids intermittently for years. He was evaluated by Dr. Dorena Cookey, [facility], 2010 for rectal bleeding. Subsequently underwent complete colonoscopy 08/03/2009 and was found to have moderate internal hemorrhoids and diverticulosis. Patient states that he has been using witch hazel with good results. Also fiber tablets. When his hemorrhoids bother him he describes bleeding and discomfort. He has a tendency toward constipation. GI review of systems is otherwise negative  REVIEW OF SYSTEMS:  All non-GI ROS negative except for sinus and allergy trouble, muscle cramps, cough, sleeping problems  Past Medical History  Diagnosis Date  . Bursitis of left hip     Dr. Constance Goltz  . Basal cell carcinoma     Basal Cell X2 , Dr Mayford Knife  . Prostatitis     Dr Vonita Moss  . Elevated PSA   . COPD (chronic obstructive pulmonary disease)   . Hypertension   . Hyperlipidemia   . Arthritis   . Carotid artery occlusion   . Diverticulosis   . Internal hemorrhoids   . Anxiety     Past Surgical History  Procedure Laterality Date  . Prostate biopsy  2007 , 2009    X2 , Dr Cleatrice Burke  & Dr Vonita Moss  . Colonoscopy  2010    negative; [facility]  . Joint replacement Left 05-2011    left total hip replacement by Dr. Turner Daniels  . Green light laser turp (transurethral resection of prostate  09/27/2012    Procedure: GREEN LIGHT LASER TURP (TRANSURETHRAL RESECTION OF PROSTATE;  Surgeon: [name], MD;  Location: [facility];  Service: Urology;  Laterality: N/A;       Social History HARLAND AGUINIGA  reports that he has been smoking Cigarettes.  He has a 10 pack-year smoking history. He has never used smokeless tobacco. He reports that he does not drink alcohol or use illicit drugs.  family history includes Arthritis in  his mother; Bone cancer in his maternal grandmother; Heart attack in his paternal grandmother; Heart disease in his mother; Hyperlipidemia in his mother; Hypertension in his mother; Transient ischemic attack in his mother. There is no history of Asthma, COPD, or Diabetes.  No Known Allergies     PHYSICAL EXAMINATION: Vital signs: BP 150/86  Pulse 72  Ht 5' 6.25" (1.683 m)  Wt 171 lb (77.565 kg)  BMI 27.38 kg/m2 General: Well-developed, well-nourished, no acute distress HEENT: Sclerae are anicteric, conjunctiva pink. Oral mucosa intact Abdomen: soft, nontender, nondistended Rectal: Moderate prolapsing internal hemorrhoids. Extremities: No edema Psychiatric: alert and oriented x3. Cooperative   ASSESSMENT:  #1. Symptomatic hemorrhoids #2. Diverticulosis on colonoscopy in 2010   PLAN:  #1. Daily fiber supplementation with Metamucil #2. Sitz baths #3. Analpram cream #4. Routine repeat screening colonoscopy due around November 2020. Interval followup as needed

## 2013-06-09 NOTE — Patient Instructions (Addendum)
You have been given a separate informational sheet regarding your tobacco use, the importance of quitting and local resources to help you quit.  We have sent the following medications to your pharmacy for you to pick up at your convenience: Analpram cream  You have been given some information on hemorrhoids  Please follow up as needed

## 2013-06-19 ENCOUNTER — Encounter: Payer: Self-pay | Admitting: Internal Medicine

## 2013-07-03 ENCOUNTER — Emergency Department (HOSPITAL_COMMUNITY): Payer: Medicare HMO

## 2013-07-03 ENCOUNTER — Encounter (HOSPITAL_COMMUNITY): Payer: Self-pay | Admitting: Emergency Medicine

## 2013-07-03 ENCOUNTER — Emergency Department (HOSPITAL_COMMUNITY)
Admission: EM | Admit: 2013-07-03 | Discharge: 2013-07-03 | Disposition: A | Discharge status: Home / Self Care | Payer: Medicare HMO | Attending: Emergency Medicine | Admitting: Emergency Medicine

## 2013-07-03 DIAGNOSIS — Z85828 Personal history of other malignant neoplasm of skin: Secondary | ICD-10-CM | POA: Insufficient documentation

## 2013-07-03 DIAGNOSIS — M129 Arthropathy, unspecified: Secondary | ICD-10-CM | POA: Insufficient documentation

## 2013-07-03 DIAGNOSIS — I10 Essential (primary) hypertension: Secondary | ICD-10-CM

## 2013-07-03 DIAGNOSIS — Z87448 Personal history of other diseases of urinary system: Secondary | ICD-10-CM | POA: Insufficient documentation

## 2013-07-03 DIAGNOSIS — Z8719 Personal history of other diseases of the digestive system: Secondary | ICD-10-CM | POA: Insufficient documentation

## 2013-07-03 DIAGNOSIS — J449 Chronic obstructive pulmonary disease, unspecified: Secondary | ICD-10-CM | POA: Insufficient documentation

## 2013-07-03 DIAGNOSIS — E785 Hyperlipidemia, unspecified: Secondary | ICD-10-CM | POA: Insufficient documentation

## 2013-07-03 DIAGNOSIS — F17200 Nicotine dependence, unspecified, uncomplicated: Secondary | ICD-10-CM | POA: Insufficient documentation

## 2013-07-03 DIAGNOSIS — J4489 Other specified chronic obstructive pulmonary disease: Secondary | ICD-10-CM | POA: Insufficient documentation

## 2013-07-03 DIAGNOSIS — Z7982 Long term (current) use of aspirin: Secondary | ICD-10-CM | POA: Insufficient documentation

## 2013-07-03 DIAGNOSIS — Z8659 Personal history of other mental and behavioral disorders: Secondary | ICD-10-CM | POA: Insufficient documentation

## 2013-07-03 DIAGNOSIS — Z79899 Other long term (current) drug therapy: Secondary | ICD-10-CM | POA: Insufficient documentation

## 2013-07-03 LAB — BASIC METABOLIC PANEL
BUN: 12 mg/dL (ref 6–23)
Creatinine, Ser: 0.85 mg/dL (ref 0.50–1.35)
GFR calc Af Amer: >90 mL/min (ref >90)
GFR calc non Af Amer: 88 mL/min — ABNORMAL LOW (ref >90)

## 2013-07-03 LAB — CBC WITH DIFFERENTIAL/PLATELET
Basophils Relative: 0 % (ref 0–1)
Eosinophils Absolute: 0.3 10*3/uL (ref 0.0–0.7)
Hemoglobin: 13.6 g/dL (ref 13.0–17.0)
MCH: 32.6 pg (ref 26.0–34.0)
MCHC: 36.2 g/dL — ABNORMAL HIGH (ref 30.0–36.0)
Monocytes Absolute: 0.9 10*3/uL (ref 0.1–1.0)
Monocytes Relative: 12 % (ref 3–12)
Neutrophils Relative %: 63 % (ref 43–77)

## 2013-07-03 LAB — POCT I-STAT TROPONIN I: Troponin i, poc: 0 ng/mL (ref 0.00–0.08)

## 2013-07-03 MED ORDER — LISINOPRIL 10 MG PO TABS
10.0000 mg | ORAL_TABLET | Freq: Once | ORAL | Status: AC
  Administered 2013-07-03: 10 mg via ORAL
  Filled 2013-07-03: qty 1

## 2013-07-03 NOTE — ED Notes (Signed)
MD at Bedside.

## 2013-07-03 NOTE — ED Provider Notes (Signed)
CSN: [id number]     Arrival date & time 07/03/13  1827 History   First MD Initiated Contact with Patient 07/03/13 1914     Chief Complaint  Patient presents with  . Hypertension   (Consider location/radiation/quality/duration/timing/severity/associated sxs/prior Treatment) Patient is a 67 y.o. male presenting with hypertension. The history is provided by the patient.  Hypertension This is a chronic problem. The current episode started today. The problem occurs constantly. The problem has been gradually worsening. Pertinent negatives include no chest pain, chills, congestion, coughing, fatigue, fever, headaches, nausea, numbness, visual change, vomiting or weakness. Nothing aggravates the symptoms. He has tried nothing for the symptoms. The treatment provided no relief.    Past Medical History  Diagnosis Date  . Bursitis of left hip     Dr. Constance Goltz  . Prostatitis     Dr Vonita Moss  . Elevated PSA   . COPD (chronic obstructive pulmonary disease)   . Hypertension   . Hyperlipidemia   . Arthritis   . Carotid artery occlusion   . Diverticulosis   . Internal hemorrhoids   . Anxiety   . Basal cell carcinoma     Basal Cell X2 , Dr Mayford Knife   Past Surgical History  Procedure Laterality Date  . Prostate biopsy  2007 , 2009    X2 , Dr Cleatrice Burke  & Dr Vonita Moss  . Colonoscopy  2010    negative; [facility]  . Green light laser turp (transurethral resection of prostate  09/27/2012    Procedure: GREEN LIGHT LASER TURP (TRANSURETHRAL RESECTION OF PROSTATE;  Surgeon: [name], MD;  Location: [facility];  Service: Urology;  Laterality: N/A;     . Joint replacement Left 05-2011    left total hip replacement by Dr. Turner Daniels   Family History  Problem Relation Age of Onset  . Arthritis Mother   . Heart disease Mother     Varicose Veins  . Hypertension Mother   . Hyperlipidemia Mother   . Transient ischemic attack Mother   . Bone cancer Maternal Grandmother   . Heart attack Paternal  Grandmother     in 90s  . Asthma Neg Hx   . COPD Neg Hx   . Diabetes Neg Hx    History  Substance Use Topics  . Smoking status: Current Every Day Smoker -- 0.25 packs/day for 40 years    Types: Cigarettes  . Smokeless tobacco: Never Used     Comment: smoked age 33-present, up to 1 ppd   . Alcohol Use: No     Comment: 2001- states no rehab    Review of Systems  Constitutional: Negative for fever, chills and fatigue.  HENT: Negative for congestion.   Respiratory: Negative for cough.   Cardiovascular: Negative for chest pain.  Gastrointestinal: Negative for nausea and vomiting.  Neurological: Negative for dizziness, seizures, syncope, facial asymmetry, weakness, numbness and headaches.  All other systems reviewed and are negative.    Allergies  Review of patient's allergies indicates no known allergies.  Home Medications   Current Outpatient Rx  Name  Route  Sig  Dispense  Refill  . aspirin 81 MG tablet   Oral   Take 81 mg by mouth daily.         . B Complex-C (B-COMPLEX WITH VITAMIN C) tablet   Oral   Take 1 tablet by mouth daily.         Marland Kitchen FIBER PO   Oral   Take 1 tablet by mouth 3 (  three) times daily.         Marland Kitchen lisinopril (PRINIVIL,ZESTRIL) 20 MG tablet      1 by mouth daily   90 tablet   3   . pentoxifylline (TRENTAL) 400 MG CR tablet   Oral   Take 400 mg by mouth 3 (three) times daily with meals.         . pramoxine-hydrocortisone (ANALPRAM HC) cream   Topical   Apply topically 3 (three) times daily.   30 g   1   . pravastatin (PRAVACHOL) 20 MG tablet      1 by mouth daily   90 tablet   3    BP 159/107  Pulse 68  Temp(Src) 98.4 F (36.9 C) (Oral)  Resp 17  SpO2 98% Physical Exam  Nursing note and vitals reviewed. Constitutional: He is oriented to person, place, and time. He appears well-developed and well-nourished. No distress.  Well-appearing male in no apparent distress  HENT:  Head: Normocephalic and atraumatic.   Mouth/Throat: Oropharynx is clear and moist. No oropharyngeal exudate.  Eyes: Conjunctivae and EOM are normal. Pupils are equal, round, and reactive to light.  No papilledema.  Neck: Normal range of motion. Neck supple.  Cardiovascular: Normal rate, regular rhythm, normal heart sounds and intact distal pulses.  Exam reveals no gallop and no friction rub.   No murmur heard. Pulmonary/Chest: Effort normal and breath sounds normal. No respiratory distress. He has no wheezes. He has no rales.  Abdominal: Soft. He exhibits no distension and no mass. There is no tenderness. There is no rebound and no guarding.  Musculoskeletal: Normal range of motion. He exhibits no edema and no tenderness.  Lymphadenopathy:    He has no cervical adenopathy.  Neurological: He is alert and oriented to person, place, and time. He has normal strength. No cranial nerve deficit or sensory deficit. He displays a negative Romberg sign. Coordination and gait normal. GCS eye subscore is 4. GCS verbal subscore is 5. GCS motor subscore is 6.  Reflex Scores:      Patellar reflexes are 2+ on the right side and 2+ on the left side.      Achilles reflexes are 2+ on the right side and 2+ on the left side. Ambulatory without ataxia. Intact tandem gait. Heel shin and finger-nose-finger without dysmetria.  Skin: Skin is warm and dry. No rash noted. He is not diaphoretic.  Psychiatric: He has a normal mood and affect. His behavior is normal. Judgment and thought content normal.    ED Course  Procedures (including critical care time) Labs Review Labs Reviewed  CBC WITH DIFFERENTIAL - Abnormal; Notable for the following:    RBC 4.17 (*)    HCT 37.6 (*)    MCHC 36.2 (*)    All other components within normal limits  BASIC METABOLIC PANEL - Abnormal; Notable for the following:    Sodium 126 (*)    Chloride 91 (*)    GFR calc non Af Amer 88 (*)    All other components within normal limits  POCT I-STAT TROPONIN I   Imaging  Review Dg Chest 2 View  07/03/2013   CLINICAL DATA:  67 year old male with hypertension and COPD.  EXAM: CHEST  2 VIEW  COMPARISON:  09/20/2012  FINDINGS: The cardiomediastinal silhouette is unremarkable.  There is no evidence of focal airspace disease, pulmonary edema, suspicious pulmonary nodule/mass, pleural effusion, or pneumothorax. No acute bony abnormalities are identified.  IMPRESSION: No active cardiopulmonary disease.  Electronically Signed   By: Laveda Abbe M.D.   On: 07/03/2013 20:34    EKG Interpretation   None       MDM   1. HTN (hypertension)     67 year old healthy male with a past medical history of hypertension who presents with hypertension. Patient usually checks his blood pressures in the morning and after he works out, today he noted that his pressures were climbing from 160s systolic to most recently 220 systolic. Denies any associated symptoms. Notably, he denies headache, vision changes, chest pain, shortness of breath, abdominal pain, nausea, vomiting, dysuria, hematuria. Patient takes lisinopril 10 mg nightly, however he has not yet had this evening.  Well-appearing male in no apparent distress. Initial blood pressure 180 systolic. Lisinopril daily dose given in the emergency department with reduction in blood pressure from 190 systolic down to 161 systolic. Patient remained asymptomatic in the emergency department. Patient was evaluated for hypertensive urgency and an organ damage with EKG, chest x-ray, basic labs, troponin. More likely is essential hypertension that is poorly controlled.  EKG shows no signs of ischemia as documented above. Chest x-ray shows no consolidation, effusion, pneumothorax, widened mediastinum. Mild hyponatremic, hypochloremic dehydration present on BMP, however no significant renal impairment. No anemia or leukocytosis. Troponin negative. Based on these findings as well as asymptomatic, nonfocal neurologic exam, feel his is likely essential  hypertension. No further treatment or evaluation in the emergency department. Patient will be referred back to his primary care doctor, and he will talk with tomorrow, about his hypertensive regimen. Patient stable for discharge.  This patient was discussed with my attending, Dr. Wilkie Aye.  Dorna Leitz, MD 07/04/13 [phone]

## 2013-07-03 NOTE — ED Notes (Signed)
This morning pt took his bp and it was systolic 160.  He has seen it climbing throughout the day and he called EMS when it elevated to 220/110.  Presently 148/122.  Pt c/o some pressure behind his eyes and states he feels very "warm".  Denies missing a dose of his bp meds.

## 2013-07-03 NOTE — ED Notes (Signed)
Pt states that when he noticed his BP was high his only symptoms were a "slight Headahce" behind his eyes, and a little fever like feeling.

## 2013-07-04 ENCOUNTER — Ambulatory Visit (INDEPENDENT_AMBULATORY_CARE_PROVIDER_SITE_OTHER): Payer: Medicare HMO | Admitting: Internal Medicine

## 2013-07-04 ENCOUNTER — Encounter: Payer: Self-pay | Admitting: Internal Medicine

## 2013-07-04 VITALS — BP 134/80 | HR 66 | Temp 98.5°F | Resp 13 | Wt 170.0 lb

## 2013-07-04 DIAGNOSIS — E871 Hypo-osmolality and hyponatremia: Secondary | ICD-10-CM | POA: Insufficient documentation

## 2013-07-04 DIAGNOSIS — I10 Essential (primary) hypertension: Secondary | ICD-10-CM

## 2013-07-04 DIAGNOSIS — F17200 Nicotine dependence, unspecified, uncomplicated: Secondary | ICD-10-CM

## 2013-07-04 DIAGNOSIS — D649 Anemia, unspecified: Secondary | ICD-10-CM

## 2013-07-04 DIAGNOSIS — Z87891 Personal history of nicotine dependence: Secondary | ICD-10-CM | POA: Insufficient documentation

## 2013-07-04 DIAGNOSIS — Z862 Personal history of diseases of the blood and blood-forming organs and certain disorders involving the immune mechanism: Secondary | ICD-10-CM | POA: Insufficient documentation

## 2013-07-04 MED ORDER — AMLODIPINE BESYLATE 5 MG PO TABS: 5.0000 mg | ORAL_TABLET | Freq: Every day | ORAL | Status: DC

## 2013-07-04 NOTE — ED Provider Notes (Signed)
I saw and evaluated the patient, reviewed the resident's note and I agree with the findings and plan.   I agree with resident's EKG interpretation  This a 67 year old male who presents with hypertension. Patient takes lisinopril at night. Patient states that he noted his pressures to be 200s over 100s today at home. He has been asymptomatic. He denies any chest pain, shortness of breath, headache. He was concerned at the time numbers and that is why he presents today. He has not taken his blood pressure medication today. He is nontoxic-appearing on exam and initial vital signs are notable for blood pressure of 185/95.   screening lab work was obtained. EKG is reassuring. Patient was given his home dose of lisinopril. His blood pressure improved. Patient remained asymptomatic.  He shows no signs or symptoms of hypertensive urgency or emergency. He is to followup with his primary care physician regarding management of blood pressure and additional medications.  After history, exam, and medical workup I feel the patient has been appropriately medically screened and is safe for discharge home. Pertinent diagnoses were discussed with the patient. Patient was given return precautions.   Shon Baton, MD 07/04/13 [id number]

## 2013-07-04 NOTE — Assessment & Plan Note (Addendum)
Renin :aldosterone level Cortisol fasting

## 2013-07-04 NOTE — Assessment & Plan Note (Signed)
See "HTN"

## 2013-07-04 NOTE — Progress Notes (Signed)
  Subjective:    Patient ID: Brian Hunt, male    DOB: 08-Nov-1945, 67 y.o.   MRN: [id number]  HPI  He is here after being seen in the emergency room 07/03/13 with accelerated hypertension. Yesterday morning his blood pressure was 160/101 prior to his workout. After exercise blood pressure was normal.BP rose through the day &was 220/110 when he was evaluated by EMS apparently 5:30 yesterday.He did experience some retro orbital pressure beginning early 10/16. He was told his eyes were  blood shot. He denied significant headache, visual change, chest pain, palpitations, dyspnea, or lightheadedness prior to the ER visit.  The emergency room records were reviewed. Cardiac enzymes were negative. EKG revealed right bundle branch block without signs of strain or ischemia. Chest x-ray suggested some left ventricular hypertrophy but there is mild pectus excavatum suggested on the lateral film.  Hematocrit was mildly decreased at 37.6. Sodium was 126, BUN 12, creatinine 0.85, and GFR 88. He is not on hydrochlorothiazide.    Review of Systems  Since ER visit his blood pressures have ranged from 131/80-187/107. The trend has been a progressive decrease  He questioned whether the hypotension was related to using a topical cream for hemorrhoidal disease. He denies abdominal pain unexplained weight loss. He's had no melena. He notices some blood on the tissue related to the hemorrhoids. His last colonoscopy was 2012. Dr. Marina Goodell did not feel it needed to be repeated at this time.   Carotid artery stenosis is felt to be stable; he was moved to an annual monitor     Objective:   Physical Exam Appears well-nourished & in no acute distress  L > R carotid bruits are present.No neck pain distention present at 10 - 15 degrees. Thyroid normal to palpation  Heart rhythm and rate are normal with no significant murmurs or gallops.  Chest is clear with no increased work of breathing  There is no evidence of  aortic aneurysm or renal artery bruits  Abdomen soft with no organomegaly or masses. No HJR. No AAA  No clubbing, cyanosis or edema present.  Pedal pulses are intact   No ischemic skin changes are present . Nails healthy   Alert and oriented. Strength, tone, DTRs reflexes normal          Assessment & Plan:  #1 accelerated hypertension, improving  #2 hyponatremia, questionable etiology  #3 carotid bruit, stable   #4 anemia See orders

## 2013-07-04 NOTE — Patient Instructions (Addendum)
Please  schedule fasting early am Labs ; orders entered. Minimal Blood Pressure Goal= AVERAGE < 140/90;  Ideal is an AVERAGE < 135/85. This AVERAGE should be calculated from @ least 5-7 BP readings taken @ different times of day on different days of week. You should not respond to isolated BP readings , but rather the AVERAGE for that week .Please bring your  blood pressure cuff to office visits to verify that it is reliable.Add Amlodipine each am if BP > 140/90. Please think about quitting smoking. Review the risks we discussed. Please call 1-800-QUIT-NOW (351-589-7382) for free smoking cessation counseling. There are multiple options are to help you stop smoking. These include nicotine patches, nicotine gum, and the new "E cigarette".

## 2013-07-08 ENCOUNTER — Other Ambulatory Visit (INDEPENDENT_AMBULATORY_CARE_PROVIDER_SITE_OTHER): Payer: Medicare HMO

## 2013-07-08 DIAGNOSIS — D649 Anemia, unspecified: Secondary | ICD-10-CM

## 2013-07-08 DIAGNOSIS — E871 Hypo-osmolality and hyponatremia: Secondary | ICD-10-CM

## 2013-07-08 LAB — CBC WITH DIFFERENTIAL/PLATELET
Basophils Absolute: 0 10*3/uL (ref 0.0–0.1)
Basophils Relative: 0.6 % (ref 0.0–3.0)
Eosinophils Absolute: 0.3 10*3/uL (ref 0.0–0.7)
Hemoglobin: 14.2 g/dL (ref 13.0–17.0)
Lymphocytes Relative: 18 % (ref 12.0–46.0)
MCHC: 34.3 g/dL (ref 30.0–36.0)
MCV: 94.1 fl (ref 78.0–100.0)
Monocytes Absolute: 0.7 10*3/uL (ref 0.1–1.0)
Neutro Abs: 4.1 10*3/uL (ref 1.4–7.7)
Neutrophils Relative %: 65.6 % (ref 43.0–77.0)
Platelets: 326 10*3/uL (ref 150.0–400.0)
RBC: 4.4 Mil/uL (ref 4.22–5.81)
RDW: 13.7 % (ref 11.5–14.6)

## 2013-07-08 LAB — IBC PANEL
Iron: 103 ug/dL (ref 42–165)
Transferrin: 278.5 mg/dL (ref 212.0–360.0)

## 2013-07-08 LAB — CORTISOL: Cortisol, Plasma: 14.5 ug/dL

## 2013-07-08 LAB — VITAMIN B12: Vitamin B-12: 596 pg/mL (ref 211–911)

## 2013-07-08 NOTE — Addendum Note (Signed)
Addended by: Verdie Shire on: 07/08/2013 08:39 AM   Modules accepted: Orders

## 2013-07-09 ENCOUNTER — Telehealth: Payer: Self-pay | Admitting: *Deleted

## 2013-07-09 ENCOUNTER — Other Ambulatory Visit: Payer: Self-pay | Admitting: *Deleted

## 2013-07-09 ENCOUNTER — Encounter: Payer: Self-pay | Admitting: Internal Medicine

## 2013-07-09 DIAGNOSIS — I10 Essential (primary) hypertension: Secondary | ICD-10-CM

## 2013-07-09 MED ORDER — LISINOPRIL 20 MG PO TABS: ORAL_TABLET | ORAL | Status: DC

## 2013-07-09 NOTE — Telephone Encounter (Signed)
Patient notified. Lisinopril reordered to reflect new directions.

## 2013-07-09 NOTE — Telephone Encounter (Signed)
Increase ACE inhibitor to twice a day for a total of 40 mg and monitor blood pressure. Please make followup appointment in 7-10 days with all readings.

## 2013-07-09 NOTE — Telephone Encounter (Signed)
Patient called and stated that his blood pressure has been running, on average, 177/99 over the last 3 days. Patient states that no other symptoms at this time. Please advise.

## 2013-07-10 ENCOUNTER — Telehealth: Payer: Self-pay | Admitting: Internal Medicine

## 2013-07-10 NOTE — Telephone Encounter (Signed)
Patient states that Dr. Alwyn Ren changed the dosage on lisinopril. He would like to know if someone could call the pharmacy to let them know this.

## 2013-07-12 LAB — ALDOSTERONE + RENIN ACTIVITY W/ RATIO
Aldosterone: 2 ng/dL
PRA LC/MS/MS: 2.69 ng/mL/h (ref 0.25–5.82)

## 2013-07-14 ENCOUNTER — Encounter: Payer: Self-pay | Admitting: Internal Medicine

## 2013-07-15 NOTE — Telephone Encounter (Signed)
Please advise 

## 2013-07-18 ENCOUNTER — Ambulatory Visit (INDEPENDENT_AMBULATORY_CARE_PROVIDER_SITE_OTHER): Payer: Medicare HMO | Admitting: Internal Medicine

## 2013-07-18 ENCOUNTER — Encounter: Payer: Self-pay | Admitting: Internal Medicine

## 2013-07-18 VITALS — BP 170/79 | HR 60 | Temp 98.4°F | Wt 170.4 lb

## 2013-07-18 DIAGNOSIS — F17200 Nicotine dependence, unspecified, uncomplicated: Secondary | ICD-10-CM

## 2013-07-18 DIAGNOSIS — I10 Essential (primary) hypertension: Secondary | ICD-10-CM

## 2013-07-18 NOTE — Assessment & Plan Note (Signed)
Amlodipine 5 mg qd; no change in ACE-I

## 2013-07-18 NOTE — Patient Instructions (Signed)
Your next office appointment will be determined based upon response of average weekly blood pressure to amlodipine 5 mg daily and lisinopril 20 mg twice a day.

## 2013-07-18 NOTE — Progress Notes (Signed)
  Subjective:    Patient ID: Brian Hunt, male    DOB: 04-Jul-1946, 67 y.o.   MRN: [id number]  HPI  Blood pressures have ranged from 133/78-186/109. 13 the average is 145/85. This is on lisinopril 20 mg twice a day and amlodipine 5 mg one half pill daily "as needed". He's had no adverse effects with the medications.  He knows of no specific triggers which exacerbate the blood pressure. Exercise doe lower it.    Review of Systems Significant headaches, epistaxis, chest pain, palpitations, exertional dyspnea, claudication, paroxysmal nocturnal dyspnea, lightheadedness,or edema absent.    Objective:   Physical Exam Thin but healthy and well-nourished & in no acute distress  R carotid bruit present.No neck pain distention present at 10 - 15 degrees. Thyroid normal to palpation  Heart rhythm and rate are normal with no significant murmurs or gallops. Faint R base murmur  Chest is clear with no increased work of breathing. Decreased BS  There is no evidence of aortic aneurysm or renal artery bruits  Abdomen soft with no organomegaly or masses. No HJR  No clubbing, cyanosis or edema present.  Pedal pulses are intact   No ischemic skin changes are present .   Alert and oriented. Strength, tone          Assessment & Plan:  See Current Assessment & Plan in Problem List under specific Diagnosis

## 2013-07-24 ENCOUNTER — Other Ambulatory Visit: Payer: Self-pay

## 2013-08-28 ENCOUNTER — Ambulatory Visit (INDEPENDENT_AMBULATORY_CARE_PROVIDER_SITE_OTHER): Payer: Medicare HMO | Admitting: Internal Medicine

## 2013-08-28 ENCOUNTER — Encounter: Payer: Self-pay | Admitting: Internal Medicine

## 2013-08-28 ENCOUNTER — Other Ambulatory Visit: Payer: Self-pay | Admitting: Internal Medicine

## 2013-08-28 VITALS — BP 132/79 | HR 55 | Temp 98.1°F | Wt 170.4 lb

## 2013-08-28 DIAGNOSIS — I10 Essential (primary) hypertension: Secondary | ICD-10-CM

## 2013-08-28 DIAGNOSIS — N486 Induration penis plastica: Secondary | ICD-10-CM | POA: Insufficient documentation

## 2013-08-28 NOTE — Progress Notes (Signed)
   Subjective:    Patient ID: Brian Hunt, male    DOB: 1946-03-12, 67 y.o.   MRN: [id number]  HPI  Blood pressure range 96/57-156 / 96 (the latter was when BP med adjustment initiated) ;average 127/78  Compliant with anti hypertemsive medication. Several episodes lightheadedness especially with CVE ; one episode severe after he took extra dose Lisinopril by mistake.He actually fell on floor.         Review of Systems Significant headaches, epistaxis, chest pain, palpitations, exertional dyspnea, claudication, paroxysmal nocturnal dyspnea, or edema absent.     Objective:   Physical Exam Appears  well-nourished & in no acute distress  Faint L carotid bruit; slight R rumble are present.No neck pain distention present at 10 - 15 degrees. Thyroid normal to palpation  Heart rhythm and rate are normal with no significant murmurs or gallops.  Chest is clear with no increased work of breathing. Decreased BS  There is no evidence of aortic aneurysm or renal artery bruits  Abdomen soft with no organomegaly or masses. No HJR  No clubbing, cyanosis or edema present.  Pedal pulses are intact   No ischemic skin changes are present . Fingernails healthy    Alert and oriented. Strength, tone, DTRs reflexes normal          Assessment & Plan:  See Current Assessment & Plan in Problem List under specific Diagnosis

## 2013-08-28 NOTE — Assessment & Plan Note (Signed)
Blood pressure control is excellent; he is having some hypotension. I recommend a trial of lisinopril 20 mg in the morning and 10 mg at night. Norvasc will be continued at the present dose. Blood pressure goals reviewed

## 2013-08-28 NOTE — Patient Instructions (Signed)
Minimal Blood Pressure Goal= AVERAGE < 140/90;  Ideal is an AVERAGE < 135/85. This AVERAGE should be calculated from @ least 5-7 BP readings taken @ different times of day on different days of week. You should not respond to isolated BP readings , but rather the AVERAGE for that week .Please bring your  blood pressure cuff to office visits to verify that it is reliable.It  can also be checked against the blood pressure device at the pharmacy. 

## 2013-08-28 NOTE — Progress Notes (Signed)
Pre visit review using our clinic review tool, if applicable. No additional management support is needed unless otherwise documented below in the visit note. 

## 2013-08-29 ENCOUNTER — Encounter: Payer: Self-pay | Admitting: Internal Medicine

## 2013-09-01 NOTE — Telephone Encounter (Signed)
Patient was calling to check the status of his medication refill for lisinopril (PRINIVIL,ZESTRIL) 20 MG tablet  Pharmacy [id number] - [place], [place] - [address] AT [facility] OF [address]

## 2013-11-05 ENCOUNTER — Other Ambulatory Visit: Payer: Self-pay | Admitting: Vascular Surgery

## 2013-11-05 DIAGNOSIS — I6529 Occlusion and stenosis of unspecified carotid artery: Secondary | ICD-10-CM

## 2013-12-30 ENCOUNTER — Other Ambulatory Visit: Payer: Self-pay | Admitting: Internal Medicine

## 2014-01-05 ENCOUNTER — Other Ambulatory Visit: Payer: Self-pay | Admitting: Internal Medicine

## 2014-01-15 ENCOUNTER — Other Ambulatory Visit: Payer: Self-pay

## 2014-01-15 DIAGNOSIS — E785 Hyperlipidemia, unspecified: Secondary | ICD-10-CM

## 2014-01-15 MED ORDER — PRAVASTATIN SODIUM 20 MG PO TABS: ORAL_TABLET | ORAL | Status: DC

## 2014-02-12 ENCOUNTER — Other Ambulatory Visit: Payer: Self-pay | Admitting: Urology

## 2014-02-23 ENCOUNTER — Encounter: Payer: Self-pay | Admitting: Internal Medicine

## 2014-02-26 ENCOUNTER — Other Ambulatory Visit (INDEPENDENT_AMBULATORY_CARE_PROVIDER_SITE_OTHER): Payer: Medicare HMO

## 2014-02-26 ENCOUNTER — Ambulatory Visit: Payer: Medicare HMO | Admitting: Internal Medicine

## 2014-02-26 ENCOUNTER — Ambulatory Visit (INDEPENDENT_AMBULATORY_CARE_PROVIDER_SITE_OTHER): Payer: Medicare HMO | Admitting: Internal Medicine

## 2014-02-26 ENCOUNTER — Encounter: Payer: Self-pay | Admitting: Internal Medicine

## 2014-02-26 VITALS — BP 128/76 | HR 58 | Temp 97.5°F | Resp 13 | Ht 68.5 in | Wt 167.6 lb

## 2014-02-26 DIAGNOSIS — C4491 Basal cell carcinoma of skin, unspecified: Secondary | ICD-10-CM | POA: Insufficient documentation

## 2014-02-26 DIAGNOSIS — I10 Essential (primary) hypertension: Secondary | ICD-10-CM

## 2014-02-26 DIAGNOSIS — Z23 Encounter for immunization: Secondary | ICD-10-CM

## 2014-02-26 DIAGNOSIS — D649 Anemia, unspecified: Secondary | ICD-10-CM

## 2014-02-26 DIAGNOSIS — R7309 Other abnormal glucose: Secondary | ICD-10-CM

## 2014-02-26 DIAGNOSIS — E785 Hyperlipidemia, unspecified: Secondary | ICD-10-CM

## 2014-02-26 LAB — LIPID PANEL
CHOL/HDL RATIO: 2
Cholesterol: 127 mg/dL (ref 0–200)
HDL: 55.8 mg/dL (ref >39.00)
LDL Cholesterol: 66 mg/dL (ref 0–99)
NonHDL: 71.2
TRIGLYCERIDES: 25 mg/dL (ref 0.0–149.0)
VLDL: 5 mg/dL (ref 0.0–40.0)

## 2014-02-26 LAB — HEPATIC FUNCTION PANEL
ALBUMIN: 3.8 g/dL (ref 3.5–5.2)
ALK PHOS: 61 U/L (ref 39–117)
ALT: 17 U/L (ref 0–53)
AST: 22 U/L (ref 0–37)
BILIRUBIN DIRECT: 0.1 mg/dL (ref 0.0–0.3)
Total Bilirubin: 0.5 mg/dL (ref 0.2–1.2)
Total Protein: 7.3 g/dL (ref 6.0–8.3)

## 2014-02-26 LAB — BASIC METABOLIC PANEL
BUN: 15 mg/dL (ref 6–23)
CALCIUM: 9.2 mg/dL (ref 8.4–10.5)
CO2: 28 meq/L (ref 19–32)
CREATININE: 0.8 mg/dL (ref 0.4–1.5)
Chloride: 98 mEq/L (ref 96–112)
GFR: 102.27 mL/min (ref >60.00)
GLUCOSE: 93 mg/dL (ref 70–99)
Potassium: 5.2 mEq/L — ABNORMAL HIGH (ref 3.5–5.1)
Sodium: 131 mEq/L — ABNORMAL LOW (ref 135–145)

## 2014-02-26 LAB — CBC WITH DIFFERENTIAL/PLATELET
Basophils Absolute: 0 10*3/uL (ref 0.0–0.1)
Basophils Relative: 0.4 % (ref 0.0–3.0)
EOS ABS: 0.4 10*3/uL (ref 0.0–0.7)
Eosinophils Relative: 6.8 % — ABNORMAL HIGH (ref 0.0–5.0)
HCT: 40.3 % (ref 39.0–52.0)
Hemoglobin: 13.6 g/dL (ref 13.0–17.0)
LYMPHS PCT: 17.3 % (ref 12.0–46.0)
Lymphs Abs: 1.1 10*3/uL (ref 0.7–4.0)
MCHC: 33.6 g/dL (ref 30.0–36.0)
MCV: 94 fl (ref 78.0–100.0)
Monocytes Absolute: 0.8 10*3/uL (ref 0.1–1.0)
Monocytes Relative: 11.8 % (ref 3.0–12.0)
NEUTROS PCT: 63.7 % (ref 43.0–77.0)
Neutro Abs: 4.1 10*3/uL (ref 1.4–7.7)
Platelets: 346 10*3/uL (ref 150.0–400.0)
RBC: 4.29 Mil/uL (ref 4.22–5.81)
RDW: 13.7 % (ref 11.5–15.5)
WBC: 6.5 10*3/uL (ref 4.0–10.5)

## 2014-02-26 LAB — HEMOGLOBIN A1C: Hgb A1c MFr Bld: 6.1 % (ref 4.6–6.5)

## 2014-02-26 LAB — TSH: TSH: 1.69 u[IU]/mL (ref 0.35–4.50)

## 2014-02-26 NOTE — Assessment & Plan Note (Signed)
CBC & dif 

## 2014-02-26 NOTE — Assessment & Plan Note (Signed)
Blood pressure goals reviewed. BMET 

## 2014-02-26 NOTE — Patient Instructions (Addendum)
Your next office appointment will be determined based upon review of your pending labs . Those instructions will be transmitted to you through My Chart  Please do not use Q-tips as we discussed. Should wax build up occur, please put 2-3 drops of mineral oil in the affected  ear at night to soften the wax .Cover the canal with a  cotton ball to prevent the oil from staining bed linens. In the morning fill the ear canal with hydrogen peroxide & lie in the opposite lateral decubitus position(on the side opposite the affected ear)  for 10-15 minutes. After allowing this period of time for the peroxide to dissolve the wax ;shower and use the thinnest washrag available to wick out the wax. If both ears are involved ; alternate this treatment from ear to ear each night until no wax is found on the washrag. 

## 2014-02-26 NOTE — Assessment & Plan Note (Signed)
Lipids, LFTs, TSH  

## 2014-02-26 NOTE — Progress Notes (Signed)
   Subjective:    Patient ID: Brian Hunt, male    DOB: [birth year], 68 y.o.   MRN: [id number]  HPI  He is here to assess active health issues & conditions. PMH, FH, & Social history verified & updated A heart healthy diet is NOT followed; exercise encompasses 60-90 minutes 3  times per week as  Gym work without symptoms.  Family history is + for premature MI. Advanced cholesterol testing reveals  LDL goal is less than 100 ; ideally <  . There is medication compliance with the statin.  Low dose ASA taken.  BP @ home average 116/72.   Review of Systems  Specifically denied are  chest pain, palpitations, dyspnea, or claudication.  Significant abdominal symptoms, memory deficit, or myalgias not present.      Objective:   Physical Exam Gen.: Healthy and well-nourished in appearance. Alert, appropriate and cooperative throughout exam.  Head: Normocephalic without obvious abnormalities;pattern alopecia  Eyes: No corneal or conjunctival inflammation noted. Pupils equal round reactive to light and accommodation. Extraocular motion intact.  Ears: External  ear exam reveals no significant lesions or deformities. Wax bilaterally. Hearing is grossly normal bilaterally. Nose: External nasal exam reveals no deformity or inflammation. Nasal mucosa are pink and moist. No lesions or exudates noted.   Mouth: Oral mucosa and oropharynx reveal no lesions or exudates. Teeth in good repair. Neck: No deformities, masses, or tenderness noted. Range of motion & Thyroid normal. Lungs: Normal respiratory effort; chest expands symmetrically. Lungs are clear to auscultation without rales, wheezes, or increased work of breathing.Decreased BS Heart: Normal rate and rhythm. Normal S1 and S2. No gallop, click, or rub. No murmur. Abdomen: Bowel sounds normal; abdomen soft and nontender. No masses, organomegaly or hernias noted. Genitalia: as per Urology                                Musculoskeletal/extremities: No  deformity or scoliosis noted of  the thoracic or lumbar spine.   No clubbing, cyanosis, edema, or significant extremity  deformity noted. Range of motion normal .Tone & strength normal. Hand joints normal Fingernail health good. Able to lie down & sit up w/o help. Negative SLR bilaterally Vascular: Carotid, radial artery, dorsalis pedis and  posterior tibial pulses are full and equal. No bruits present. Neurologic: Alert and oriented x3. Deep tendon reflexes symmetrical and normal.  Gait normal.      Skin: Intact without suspicious lesions or rashes. Lymph: No cervical, axillary lymphadenopathy present. Psych: Mood and affect are normal. Normally interactive                                                                                       Assessment & Plan:  See Current Assessment & Plan in Problem List under specific DiagnosisThe labs will be reviewed and risks and options assessed. Written recommendations will be provided by mail or directly through My Chart.Further evaluation or change in medical therapy will be directed by those results.

## 2014-02-26 NOTE — Assessment & Plan Note (Signed)
A1c & urine microalb

## 2014-02-26 NOTE — Progress Notes (Signed)
Pre visit review using our clinic review tool, if applicable. No additional management support is needed unless otherwise documented below in the visit note. 

## 2014-02-27 ENCOUNTER — Telehealth: Payer: Self-pay | Admitting: Internal Medicine

## 2014-02-27 NOTE — Telephone Encounter (Signed)
Relevant patient education assigned to patient using Emmi. ° °

## 2014-03-13 ENCOUNTER — Ambulatory Visit (HOSPITAL_BASED_OUTPATIENT_CLINIC_OR_DEPARTMENT_OTHER): Admission: RE | Admit: 2014-03-13 | Payer: Medicare HMO | Source: Ambulatory Visit | Admitting: Urology

## 2014-03-13 ENCOUNTER — Encounter (HOSPITAL_BASED_OUTPATIENT_CLINIC_OR_DEPARTMENT_OTHER): Admission: RE | Payer: Self-pay | Source: Ambulatory Visit

## 2014-03-13 SURGERY — NESBIT PROCEDURE
Anesthesia: General | Site: Penis

## 2014-04-06 IMAGING — CR DG CHEST 2V
2 series · 2 of 2 positions shown · non-contrast
Comparison: 09/20/2012

CLINICAL DATA: 67-year-old male with hypertension and COPD.

EXAM:
CHEST  2 VIEW

[w chest pa]
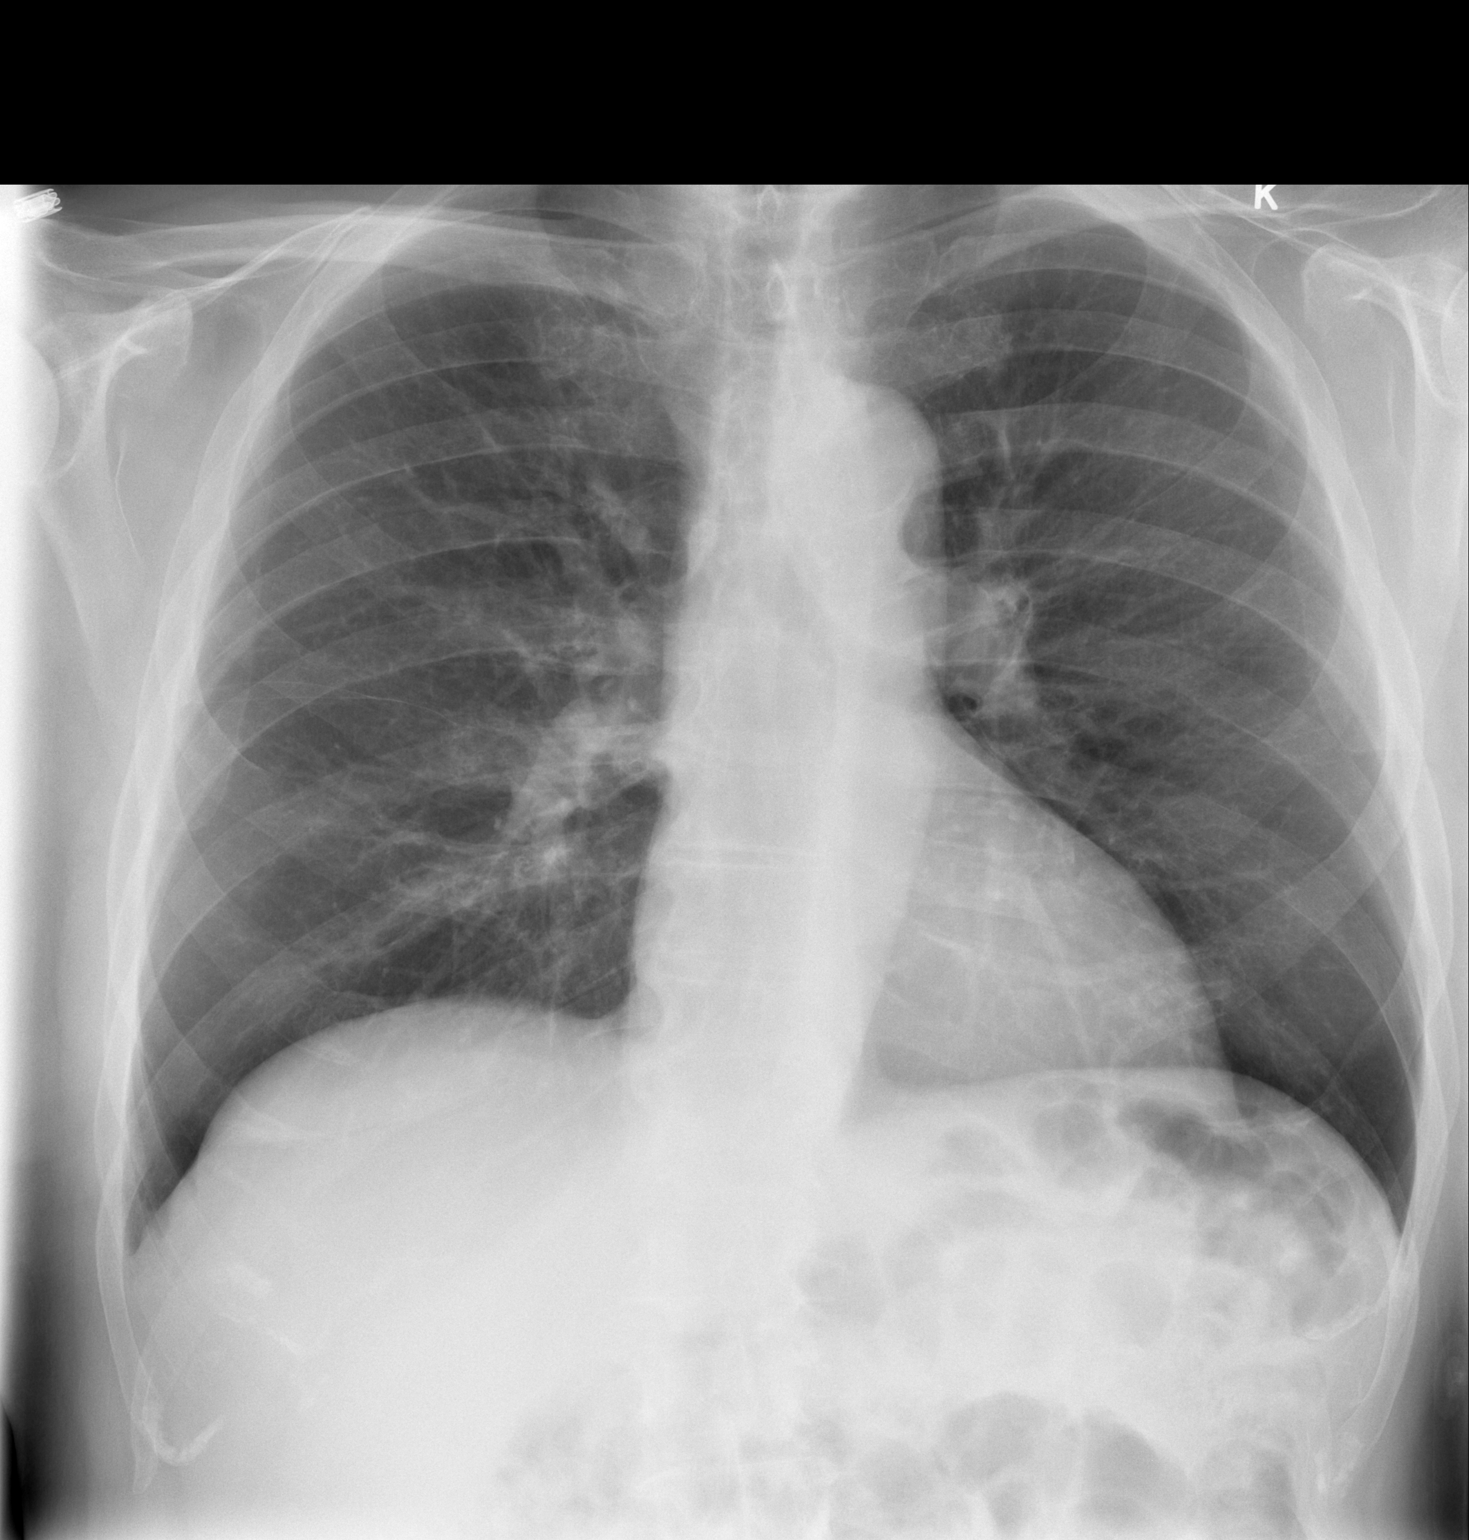

[w chest lat]
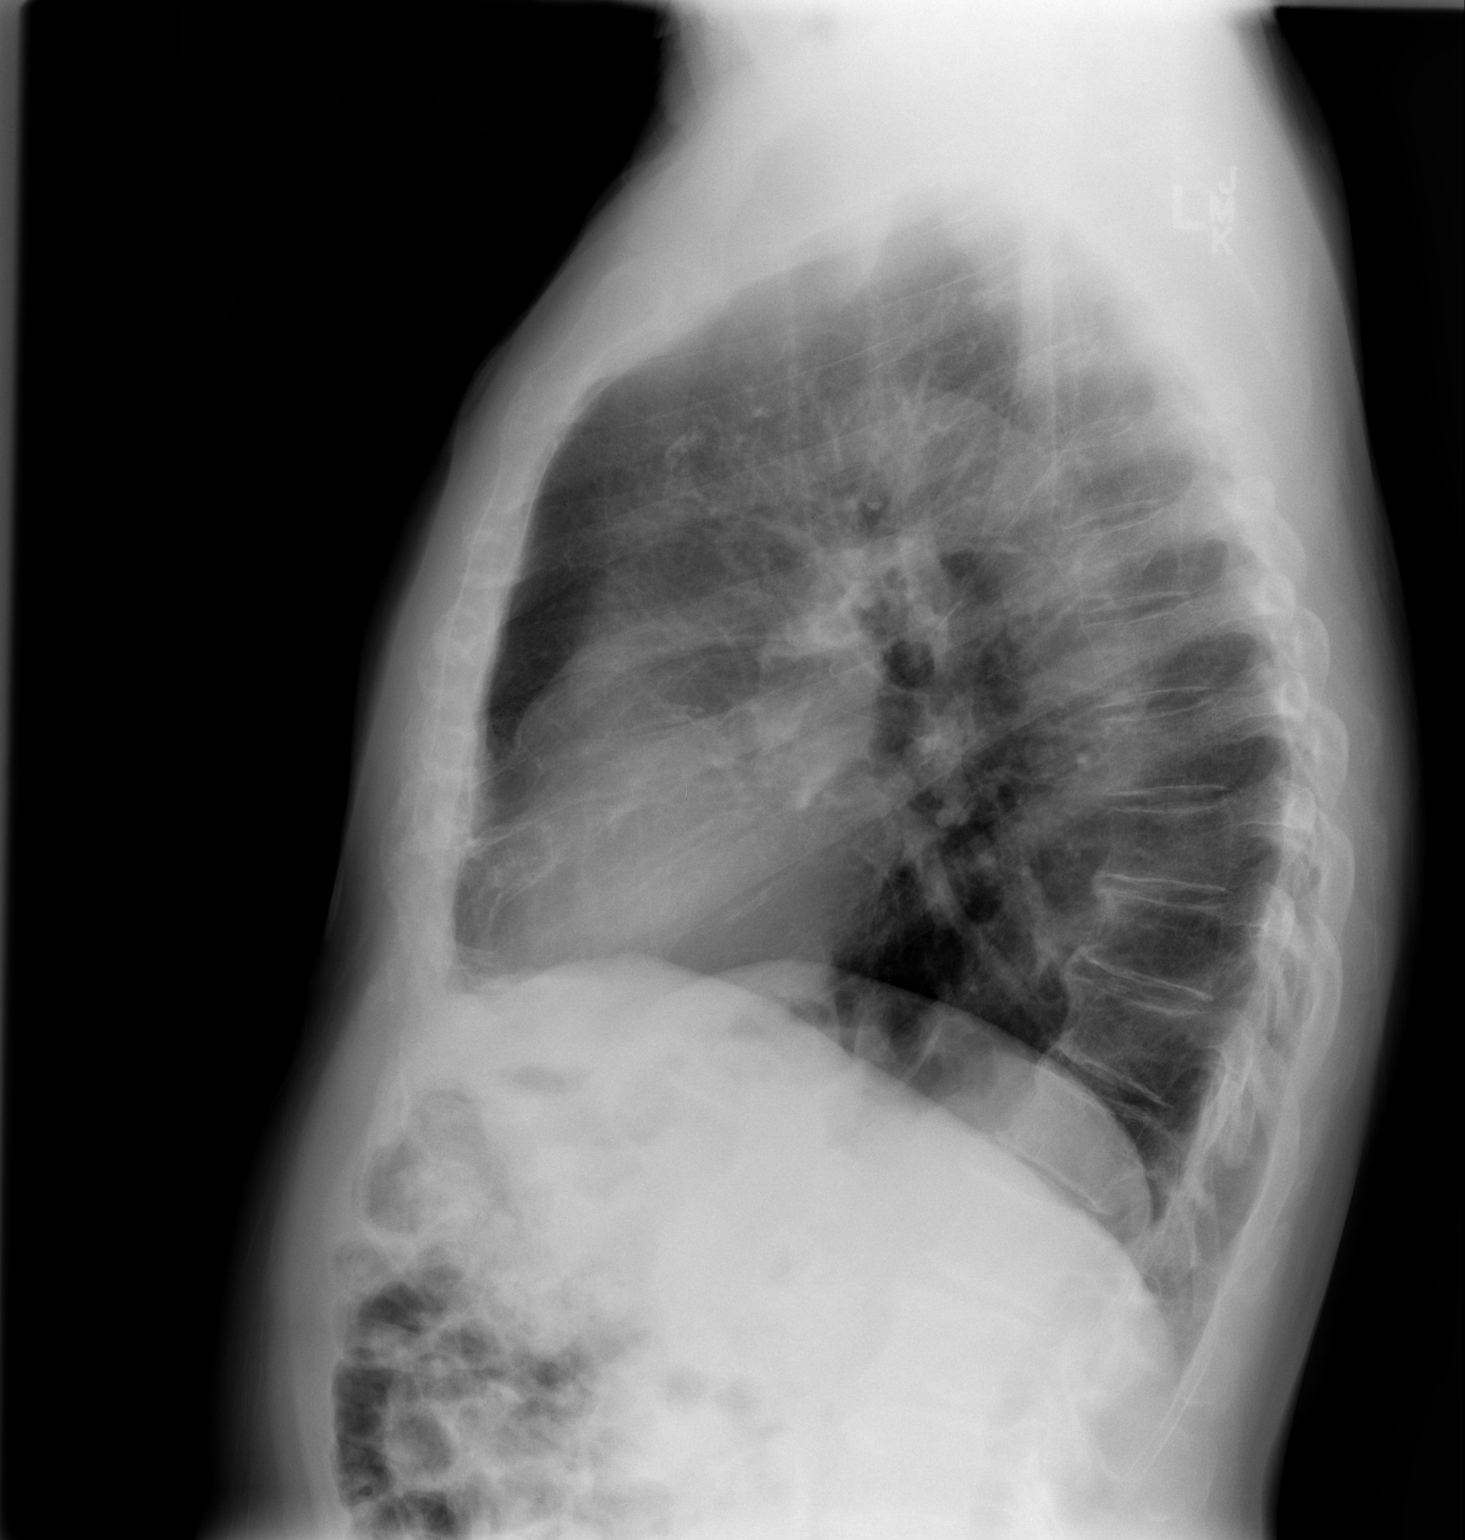

[2 of 2 positions shown; findings below may reference images not displayed]

FINDINGS: The cardiomediastinal silhouette is unremarkable.

There is no evidence of focal airspace disease, pulmonary edema,
suspicious pulmonary nodule/mass, pleural effusion, or pneumothorax.
No acute bony abnormalities are identified.
IMPRESSION: No active cardiopulmonary disease.

## 2014-04-26 ENCOUNTER — Encounter: Payer: Self-pay | Admitting: Internal Medicine

## 2014-04-26 ENCOUNTER — Other Ambulatory Visit: Payer: Self-pay | Admitting: Internal Medicine

## 2014-04-27 MED ORDER — LISINOPRIL 20 MG PO TABS: ORAL_TABLET | ORAL | Status: DC

## 2014-04-30 ENCOUNTER — Encounter: Payer: Self-pay | Admitting: Family

## 2014-05-01 ENCOUNTER — Ambulatory Visit (HOSPITAL_COMMUNITY)
Admission: RE | Admit: 2014-05-01 | Discharge: 2014-05-01 | Disposition: A | Discharge status: Home / Self Care | Payer: Medicare PPO | Source: Ambulatory Visit | Attending: Family | Admitting: Family

## 2014-05-01 ENCOUNTER — Ambulatory Visit (INDEPENDENT_AMBULATORY_CARE_PROVIDER_SITE_OTHER): Payer: Medicare PPO | Admitting: Family

## 2014-05-01 ENCOUNTER — Encounter: Payer: Self-pay | Admitting: Family

## 2014-05-01 VITALS — BP 129/85 | HR 63 | Resp 16 | Ht 68.0 in | Wt 169.0 lb

## 2014-05-01 DIAGNOSIS — I6529 Occlusion and stenosis of unspecified carotid artery: Secondary | ICD-10-CM | POA: Insufficient documentation

## 2014-05-01 NOTE — Progress Notes (Signed)
Established Carotid Patient   History of Present Illness  Brian Hunt is a 68 y.o. male patient of Dr. Bridgett Larsson followed for known carotid stenosis.  He returns today for follow up.  Patient has not had previous carotid artery intervention.  Patient has Negative history of TIA or stroke symptom.  The patient denies amaurosis fugax or monocular blindness.  The patient  denies facial drooping.  Pt. denies hemiplegia.  The patient denies receptive or expressive aphasia.  Pt. denies extremity weakness.  Pt reports New Medical or Surgical History: a hypertensive episode October, 2014, his PCP adjusted his hypertension medication at that time, states he has had no further blood pressure issues, that is why he stopped smoking he states. He works out in a gym 3x/week. He denies claudication symptoms with walking.  Pt Diabetic: No Pt smoker: former smoker, quit in October,  2014  Pt meds include: Statin : Yes ASA: Yes Other anticoagulants/antiplatelets: no   Past Medical History  Diagnosis Date  . Bursitis of left hip     Dr. Ihor Gully  . Prostatitis     Dr Terance Hart  . Elevated PSA   . COPD (chronic obstructive pulmonary disease)   . Hypertension   . Hyperlipidemia   . Arthritis   . Carotid artery occlusion   . Diverticulosis   . Internal hemorrhoids   . Anxiety   . Basal cell carcinoma     Basal Cell X2 , Dr Radford Pax    Social History History  Substance Use Topics  . Smoking status: Former Smoker -- 0.25 packs/day for 40 years    Types: Cigarettes    Quit date: 07/03/2013  . Smokeless tobacco: Never Used     Comment: smoked [phone], up to 1 ppd   . Alcohol Use: No     Comment: 2001- states no rehab    Family History Family History  Problem Relation Age of Onset  . Arthritis Mother   . Vascular Disease Mother     Varicose Veins  . Hypertension Mother   . Hyperlipidemia Mother   . Transient ischemic attack Mother   . Bone cancer Maternal Grandmother   . Heart attack  Paternal Grandmother 28  . Asthma Neg Hx   . COPD Neg Hx   . Diabetes Neg Hx     Surgical History Past Surgical History  Procedure Laterality Date  . Prostate biopsy  2007 , 2009    X2 , Dr Thomasene Mohair  & Dr Terance Hart  . Colonoscopy  2010    negative; [facility]  . Green light laser turp (transurethral resection of prostate  09/27/2012    Procedure: GREEN LIGHT LASER TURP (TRANSURETHRAL RESECTION OF PROSTATE;  Surgeon: Fredricka Bonine, MD;  Location: [facility];  Service: Urology;  Laterality: N/A;     . Joint replacement Left 05-2011    left total hip replacement by Dr. Mayer Camel    No Known Allergies  Current Outpatient Prescriptions  Medication Sig Dispense Refill  . amLODipine (NORVASC) 5 MG tablet TAKE 1 TABLET BY MOUTH EVERY DAY  30 tablet  5  . aspirin 81 MG tablet Take 81 mg by mouth daily.      . B Complex-C (B-COMPLEX WITH VITAMIN C) tablet Take 1 tablet by mouth daily.      Marland Kitchen FIBER PO Take 1 tablet by mouth 3 (three) times daily.      Marland Kitchen lisinopril (PRINIVIL,ZESTRIL) 20 MG tablet TAKE 2 TABLETS BY MOUTH ONCE DAILY  90 tablet  1  . pentoxifylline (TRENTAL) 400 MG CR tablet Take 400 mg by mouth 3 (three) times daily with meals.      . pramoxine-hydrocortisone (ANALPRAM HC) cream Apply topically 3 (three) times daily.  30 g  1  . pravastatin (PRAVACHOL) 20 MG tablet 1 by mouth daily  90 tablet  1   No current facility-administered medications for this visit.    Review of Systems : See HPI for pertinent positives and negatives.  Physical Examination  Filed Vitals:   05/01/14 1421 05/01/14 1424  BP: 133/85 129/85  Pulse: 63 63  Resp:  16  Height:  5\' 8"  (1.727 m)  Weight:  169 lb (76.658 kg)  SpO2:  98%   Body mass index is 25.7 kg/(m^2).  General: WDWN male in NAD GAIT: normal Eyes: PERRLA Pulmonary:  Non-labored, CTAB, Negative  Rales, Negative rhonchi, & Negative wheezing.  Cardiac: regular Rhythm ,  Negative detected murmur.  VASCULAR EXAM Carotid Bruits  Right Left   Positive, prominent  Positive    Aorta is not palpable. Radial pulses are 2+ palpable and equal.                                                                                                                            LE Pulses Right Left       POPLITEAL  not palpable   not palpable       POSTERIOR TIBIAL   palpable    palpable        DORSALIS PEDIS      ANTERIOR TIBIAL  palpable   palpable     Gastrointestinal: soft, nontender, BS WNL, no r/g,  negative masses.  Musculoskeletal: Negative muscle atrophy/wasting. M/S 5/5 throughout, Extremities without ischemic changes.  Neurologic: A&O X 3; Appropriate Affect ; SENSATION ;normal;  Speech is normal CN 2-12 intact, Pain and light touch intact in extremities, Motor exam as listed above.   Non-Invasive Vascular Imaging CAROTID DUPLEX 05/01/2014 CEREBROVASCULAR DUPLEX EVALUATION    INDICATION: Follow-up carotid disease     PREVIOUS INTERVENTION(S):     DUPLEX EXAM:     RIGHT  LEFT  Peak Systolic Velocities (cm/s) End Diastolic Velocities (cm/s) Plaque LOCATION Peak Systolic Velocities (cm/s) End Diastolic Velocities (cm/s) Plaque  90 18  CCA PROXIMAL 97 26   72 21  CCA MID 68 23   70 22  CCA DISTAL 67 22   84 21  ECA 93 16   66 25 CP ICA PROXIMAL 249 96 CP  90 36  ICA MID 137 30 CP  69 29  ICA DISTAL 81 32     .94 ICA / CCA Ratio (PSV) 3.7  Antegrade  Vertebral Flow Antegrade   128 Brachial Systolic Pressure (mmHg) 786  Within normal limits  Brachial Artery Waveforms Within normal limits     Plaque Morphology:  HM = Homogeneous, HT = Heterogeneous, CP = Calcific Plaque, SP = Smooth Plaque, IP = Irregular Plaque  ADDITIONAL FINDINGS:     IMPRESSION: 1. Evidence of <40% stenosis of the right internal carotid artery. 2. Technically difficult study of the left internal carotid artery. Velocities suggest 60%-79% stenosis; however, plaque is calcific with acoustic shadow which may underestimate disease  and velocities. There is post stenotic turbulence in the mid internal carotid artery waveforms. 3. Bilateral vertebral artery is antegrade.     Compared to the previous exam:  Velocity increase in left internal carotid artery compared to 1 year ago; plaque was calcific and possibly underestimated at that time.      Assessment: Brian Hunt is a 68 y.o. male who presents with asymptomatic minimal right ICA stenosis and 60 - 79 % left ICA stenosis. Velocity increase in left internal carotid artery compared to 1 year ago; plaque was calcific and possibly underestimated at that time.   Plan: Follow-up in 6 months with Carotid Duplex scan.   I discussed in depth with the patient the nature of atherosclerosis, and emphasized the importance of maximal medical management including strict control of blood pressure, blood glucose, and lipid levels, obtaining regular exercise, and continued cessation of smoking.  The patient is aware that without maximal medical management the underlying atherosclerotic disease process will progress, limiting the benefit of any interventions. The patient was given information about stroke prevention and what symptoms should prompt the patient to seek immediate medical care. Thank you for allowing [place] to participate in this patient's care.  Clemon Chambers, RN, MSN, FNP-C [facility] Office: [phone]  Clinic Physician: Bridgett Larsson on call  05/01/2014 2:09 PM

## 2014-05-01 NOTE — Patient Instructions (Signed)
Stroke Prevention Some medical conditions and behaviors are associated with an increased chance of having a stroke. You may prevent a stroke by making healthy choices and managing medical conditions. HOW CAN I REDUCE MY RISK OF HAVING A STROKE?   Stay physically active. Get at least 30 minutes of activity on most or all days.  Do not smoke. It may also be helpful to avoid exposure to secondhand smoke.  Limit alcohol use. Moderate alcohol use is considered to be:  No more than 2 drinks per day for men.  No more than 1 drink per day for nonpregnant women.  Eat healthy foods. This involves:  Eating 5 or more servings of fruits and vegetables a day.  Making dietary changes that address high blood pressure (hypertension), high cholesterol, diabetes, or obesity.  Manage your cholesterol levels.  Making food choices that are high in fiber and low in saturated fat, trans fat, and cholesterol may control cholesterol levels.  Take any prescribed medicines to control cholesterol as directed by your health care provider.  Manage your diabetes.  Controlling your carbohydrate and sugar intake is recommended to manage diabetes.  Take any prescribed medicines to control diabetes as directed by your health care provider.  Control your hypertension.  Making food choices that are low in salt (sodium), saturated fat, trans fat, and cholesterol is recommended to manage hypertension.  Take any prescribed medicines to control hypertension as directed by your health care provider.  Maintain a healthy weight.  Reducing calorie intake and making food choices that are low in sodium, saturated fat, trans fat, and cholesterol are recommended to manage weight.  Stop drug abuse.  Avoid taking birth control pills.  Talk to your health care provider about the risks of taking birth control pills if you are over 35 years old, smoke, get migraines, or have ever had a blood clot.  Get evaluated for sleep  disorders (sleep apnea).  Talk to your health care provider about getting a sleep evaluation if you snore a lot or have excessive sleepiness.  Take medicines only as directed by your health care provider.  For some people, aspirin or blood thinners (anticoagulants) are helpful in reducing the risk of forming abnormal blood clots that can lead to stroke. If you have the irregular heart rhythm of atrial fibrillation, you should be on a blood thinner unless there is a good reason you cannot take them.  Understand all your medicine instructions.  Make sure that other conditions (such as anemia or atherosclerosis) are addressed. SEEK IMMEDIATE MEDICAL CARE IF:   You have sudden weakness or numbness of the face, arm, or leg, especially on one side of the body.  Your face or eyelid droops to one side.  You have sudden confusion.  You have trouble speaking (aphasia) or understanding.  You have sudden trouble seeing in one or both eyes.  You have sudden trouble walking.  You have dizziness.  You have a loss of balance or coordination.  You have a sudden, severe headache with no known cause.  You have new chest pain or an irregular heartbeat. Any of these symptoms may represent a serious problem that is an emergency. Do not wait to see if the symptoms will go away. Get medical help at once. Call your local emergency services (911 in [place] Do not drive yourself to the hospital. Document Released: 10/12/2004 Document Revised: 01/19/2014 Document Reviewed: 03/07/2013 Exit[employer] Patient Information 2015 [employer]. This information is not intended to replace advice given   to you by your health care provider. Make sure you discuss any questions you have with your health care provider.  

## 2014-05-06 NOTE — Addendum Note (Signed)
Addended by: Mena Goes on: 05/06/2014 10:41 AM   Modules accepted: Orders

## 2014-06-04 ENCOUNTER — Encounter: Payer: Self-pay | Admitting: Internal Medicine

## 2014-06-04 DIAGNOSIS — Z85828 Personal history of other malignant neoplasm of skin: Secondary | ICD-10-CM | POA: Insufficient documentation

## 2014-06-08 ENCOUNTER — Other Ambulatory Visit: Payer: Self-pay | Admitting: Internal Medicine

## 2014-06-28 ENCOUNTER — Other Ambulatory Visit: Payer: Self-pay | Admitting: Internal Medicine

## 2014-06-29 ENCOUNTER — Other Ambulatory Visit: Payer: Self-pay | Admitting: Internal Medicine

## 2014-07-06 ENCOUNTER — Encounter: Payer: Self-pay | Admitting: Internal Medicine

## 2014-07-08 ENCOUNTER — Other Ambulatory Visit: Payer: Self-pay | Admitting: Internal Medicine

## 2014-07-08 ENCOUNTER — Telehealth: Payer: Self-pay | Admitting: Internal Medicine

## 2014-07-08 NOTE — Telephone Encounter (Signed)
[facility] pharmacy called to state they never rec'd fax amlodipine. Two were sent on 06/08/14 from here. One with 1 refill, one with 0 refills. Pharmacy requesting refill. Please see refills dated 06/08/14

## 2014-07-09 MED ORDER — AMLODIPINE BESYLATE 5 MG PO TABS: ORAL_TABLET | ORAL | Status: DC

## 2014-07-09 NOTE — Telephone Encounter (Signed)
Resent to [facility]../lmb 

## 2014-10-04 ENCOUNTER — Other Ambulatory Visit: Payer: Self-pay | Admitting: Internal Medicine

## 2014-10-19 HISTORY — PX: CYSTOSCOPY: SUR368

## 2014-11-05 ENCOUNTER — Encounter: Payer: Self-pay | Admitting: Family

## 2014-11-06 ENCOUNTER — Encounter: Payer: Self-pay | Admitting: Family

## 2014-11-06 ENCOUNTER — Ambulatory Visit (INDEPENDENT_AMBULATORY_CARE_PROVIDER_SITE_OTHER): Payer: Medicare PPO | Admitting: Family

## 2014-11-06 ENCOUNTER — Ambulatory Visit (HOSPITAL_COMMUNITY)
Admission: RE | Admit: 2014-11-06 | Discharge: 2014-11-06 | Disposition: A | Discharge status: Home / Self Care | Payer: Medicare PPO | Source: Ambulatory Visit | Attending: Family | Admitting: Family

## 2014-11-06 VITALS — BP 129/83 | HR 56 | Resp 16 | Ht 67.0 in | Wt 168.0 lb

## 2014-11-06 DIAGNOSIS — I6523 Occlusion and stenosis of bilateral carotid arteries: Secondary | ICD-10-CM

## 2014-11-06 DIAGNOSIS — Z87891 Personal history of nicotine dependence: Secondary | ICD-10-CM

## 2014-11-06 NOTE — Patient Instructions (Signed)
Stroke Prevention Some medical conditions and behaviors are associated with an increased chance of having a stroke. You may prevent a stroke by making healthy choices and managing medical conditions. HOW CAN I REDUCE MY RISK OF HAVING A STROKE?   Stay physically active. Get at least 30 minutes of activity on most or all days.  Do not smoke. It may also be helpful to avoid exposure to secondhand smoke.  Limit alcohol use. Moderate alcohol use is considered to be:  No more than 2 drinks per day for men.  No more than 1 drink per day for nonpregnant women.  Eat healthy foods. This involves:  Eating 5 or more servings of fruits and vegetables a day.  Making dietary changes that address high blood pressure (hypertension), high cholesterol, diabetes, or obesity.  Manage your cholesterol levels.  Making food choices that are high in fiber and low in saturated fat, trans fat, and cholesterol may control cholesterol levels.  Take any prescribed medicines to control cholesterol as directed by your health care provider.  Manage your diabetes.  Controlling your carbohydrate and sugar intake is recommended to manage diabetes.  Take any prescribed medicines to control diabetes as directed by your health care provider.  Control your hypertension.  Making food choices that are low in salt (sodium), saturated fat, trans fat, and cholesterol is recommended to manage hypertension.  Take any prescribed medicines to control hypertension as directed by your health care provider.  Maintain a healthy weight.  Reducing calorie intake and making food choices that are low in sodium, saturated fat, trans fat, and cholesterol are recommended to manage weight.  Stop drug abuse.  Avoid taking birth control pills.  Talk to your health care provider about the risks of taking birth control pills if you are over 35 years old, smoke, get migraines, or have ever had a blood clot.  Get evaluated for sleep  disorders (sleep apnea).  Talk to your health care provider about getting a sleep evaluation if you snore a lot or have excessive sleepiness.  Take medicines only as directed by your health care provider.  For some people, aspirin or blood thinners (anticoagulants) are helpful in reducing the risk of forming abnormal blood clots that can lead to stroke. If you have the irregular heart rhythm of atrial fibrillation, you should be on a blood thinner unless there is a good reason you cannot take them.  Understand all your medicine instructions.  Make sure that other conditions (such as anemia or atherosclerosis) are addressed. SEEK IMMEDIATE MEDICAL CARE IF:   You have sudden weakness or numbness of the face, arm, or leg, especially on one side of the body.  Your face or eyelid droops to one side.  You have sudden confusion.  You have trouble speaking (aphasia) or understanding.  You have sudden trouble seeing in one or both eyes.  You have sudden trouble walking.  You have dizziness.  You have a loss of balance or coordination.  You have a sudden, severe headache with no known cause.  You have new chest pain or an irregular heartbeat. Any of these symptoms may represent a serious problem that is an emergency. Do not wait to see if the symptoms will go away. Get medical help at once. Call your local emergency services (911 in [place] Do not drive yourself to the hospital. Document Released: 10/12/2004 Document Revised: 01/19/2014 Document Reviewed: 03/07/2013 Exit[employer] Patient Information 2015 [employer]. This information is not intended to replace advice given   to you by your health care provider. Make sure you discuss any questions you have with your health care provider.  

## 2014-11-06 NOTE — Progress Notes (Signed)
Established Carotid Patient   History of Present Illness  Brian Hunt is a 68 y.o. male  patient of Dr. Bridgett Larsson followed for known carotid stenosis.  He returns today for follow up.  Patient has not had previous carotid artery intervention.  Patient has Negative history of TIA or stroke symptom. The patient denies amaurosis fugax or monocular blindness. The patient denies facial drooping. Pt. denies hemiplegia. The patient denies receptive or expressive aphasia. Pt. denies extremity weakness.  Pt reports New Medical or Surgical History: a hypertensive episode October, 2014, his PCP adjusted his hypertension medication at that time, states he has had no further blood pressure issues, that is why he stopped smoking he states. He works out in a gym 3x/week. He denies claudication symptoms with walking.  Pt Diabetic: No Pt smoker: former smoker, quit in October, 2014  Pt meds include: Statin : Yes ASA: Yes Other anticoagulants/antiplatelets: no   Past Medical History  Diagnosis Date  . Bursitis of left hip     Dr. Ihor Gully  . Prostatitis     Dr Terance Hart  . Elevated PSA   . COPD (chronic obstructive pulmonary disease)   . Hypertension   . Hyperlipidemia   . Arthritis   . Carotid artery occlusion   . Diverticulosis   . Internal hemorrhoids   . Anxiety   . Basal cell carcinoma     Basal Cell X2 , Dr Radford Pax  . Anemia     Social History History  Substance Use Topics  . Smoking status: Former Smoker -- 0.25 packs/day for 40 years    Types: Cigarettes    Quit date: 07/03/2013  . Smokeless tobacco: Never Used     Comment: smoked [phone], up to 1 ppd   . Alcohol Use: No     Comment: 2001- states no rehab    Family History Family History  Problem Relation Age of Onset  . Arthritis Mother   . Vascular Disease Mother     Varicose Veins  . Hypertension Mother   . Hyperlipidemia Mother   . Transient ischemic attack Mother   . Heart disease Mother     After age  2  . Stroke Mother   . Varicose Veins Mother   . Heart attack Mother   . Bone cancer Maternal Grandmother   . Heart attack Paternal Grandmother 72  . Asthma Neg Hx   . Diabetes Neg Hx   . COPD Father     Surgical History Past Surgical History  Procedure Laterality Date  . Prostate biopsy  2007 , 2009    X2 , Dr Thomasene Mohair  & Dr Terance Hart  . Colonoscopy  2010    negative; [facility]  . Green light laser turp (transurethral resection of prostate  09/27/2012    Procedure: GREEN LIGHT LASER TURP (TRANSURETHRAL RESECTION OF PROSTATE;  Surgeon: Fredricka Bonine, MD;  Location: [facility];  Service: Urology;  Laterality: N/A;     . Joint replacement Left Oct. 12, 2012    left total hip replacement by Dr. Mayer Camel  . Prostate surgery      Laser    No Known Allergies  Current Outpatient Prescriptions  Medication Sig Dispense Refill  . amLODipine (NORVASC) 5 MG tablet TAKE 1 TABLET BY MOUTH EVERY DAY 90 tablet 1  . aspirin 81 MG tablet Take 81 mg by mouth daily.    . B Complex-C (B-COMPLEX WITH VITAMIN C) tablet Take 1 tablet by mouth daily.    Marland Kitchen FIBER  PO Take 1 tablet by mouth 3 (three) times daily.    Marland Kitchen lisinopril (PRINIVIL,ZESTRIL) 20 MG tablet TAKE 1  1/2  TABLETS BY MOUTH ONCE DAILY    . lisinopril (PRINIVIL,ZESTRIL) 20 MG tablet TAKE 2 TABLETS BY MOUTH ONCE DAILY (Patient not taking: Reported on 11/06/2014) 180 tablet 1  . pentoxifylline (TRENTAL) 400 MG CR tablet Take 400 mg by mouth 3 (three) times daily with meals.    . pramoxine-hydrocortisone (ANALPRAM HC) cream Apply topically 3 (three) times daily. (Patient not taking: Reported on 11/06/2014) 30 g 1  . pravastatin (PRAVACHOL) 20 MG tablet TAKE 1 TABLET BY MOUTH DAILY 90 tablet 1  . WHEAT DEXTRIN PO Take by mouth daily.     No current facility-administered medications for this visit.    Review of Systems : See HPI for pertinent positives and negatives.  Physical Examination  Filed Vitals:   11/06/14 1003 11/06/14 1006   BP: 135/88 129/83  Pulse: 57 56  Resp:  16  Height:  5\' 7"  (1.702 m)  Weight:  168 lb (76.204 kg)  SpO2:  100%   Body mass index is 26.31 kg/(m^2).  General: WDWN male in NAD GAIT: normal Eyes: PERRLA Pulmonary: Non-labored, CTAB, Negative Rales, Negative rhonchi, & Negative wheezing.  Cardiac: regular Rhythm, no detected murmur.  VASCULAR EXAM Carotid Bruits Right Left   Positive negative   Aorta is not palpable. Radial pulses are 2+ palpable and equal.      LE Pulses Right Left   POPLITEAL not palpable  not palpable   POSTERIOR TIBIAL  palpable   palpable    DORSALIS PEDIS  ANTERIOR TIBIAL palpable  palpable     Gastrointestinal: soft, nontender, BS WNL, no r/g,no palpable masses.  Musculoskeletal: Negative muscle atrophy/wasting. M/S 5/5 throughout, Extremities without ischemic changes.  Neurologic: A&O X 3; Appropriate Affect,  Speech is normal CN 2-12 intact, Pain and light touch intact in extremities, Motor exam as listed above.         Non-Invasive Vascular Imaging CAROTID DUPLEX 11/06/2014   CEREBROVASCULAR DUPLEX EVALUATION    INDICATION: Carotid artery disease    PREVIOUS INTERVENTION(S): None    DUPLEX EXAM: Carotid duplex    RIGHT  LEFT  Peak Systolic Velocities (cm/s) End Diastolic Velocities (cm/s) Plaque LOCATION Peak Systolic Velocities (cm/s) End Diastolic Velocities (cm/s) Plaque  88 15 - CCA PROXIMAL 127 28 -  87 16 - CCA MID 82 18 -  80 22 - CCA DISTAL 88 21 HT  85 17 - ECA 88 13 CP  68 22 CP ICA PROXIMAL 200 73 CP  81 31 - ICA MID 84 26 -  86 30 - ICA DISTAL 54 19 -    .98 ICA / CCA Ratio (PSV) 2.4  Antegrade Vertebral Flow Antegrade  093 Brachial Systolic Pressure (mmHg)   Triphasic Brachial Artery Waveforms Triphasic    Plaque Morphology:  HM =  Homogeneous, HT = Heterogeneous, CP = Calcific Plaque, SP = Smooth Plaque, IP = Irregular Plaque  ADDITIONAL FINDINGS:     IMPRESSION: 1. Less than 40% right internal carotid artery stenosis. 2. 60 - 79% left internal carotid artery stenosis, however, calcific plaque may be obscuring higher velocity.    Compared to the previous exam:  Unable to obtain increased velocity on the left as shown on prior exam      Assessment: Brian Hunt is a 69 y.o. male who has no history of stroke or TIA. Today's carotid Duplex reveals less than  40% right internal carotid artery stenosis and 60 - 79% left internal carotid artery stenosis, however, calcific plaque may be obscuring higher velocity The  ICA stenosis is  Unchanged from previous exam.  Plan: Follow-up in 6 months with Carotid Duplex.   I discussed in depth with the patient the nature of atherosclerosis, and emphasized the importance of maximal medical management including strict control of blood pressure, blood glucose, and lipid levels, obtaining regular exercise, and continued cessation of smoking.  The patient is aware that without maximal medical management the underlying atherosclerotic disease process will progress, limiting the benefit of any interventions. The patient was given information about stroke prevention and what symptoms should prompt the patient to seek immediate medical care. Thank you for allowing [place] to participate in this patient's care.  Clemon Chambers, RN, MSN, FNP-C [facility] Office: [phone]  Clinic Physician: Bridgett Larsson  11/06/2014 10:21 AM

## 2014-11-06 NOTE — Addendum Note (Signed)
Addended by: Mena Goes on: 11/06/2014 04:44 PM   Modules accepted: Orders

## 2015-01-04 ENCOUNTER — Encounter: Payer: Self-pay | Admitting: Internal Medicine

## 2015-01-05 MED ORDER — AMLODIPINE BESYLATE 5 MG PO TABS: ORAL_TABLET | ORAL | Status: DC

## 2015-03-31 ENCOUNTER — Other Ambulatory Visit: Payer: Self-pay | Admitting: Internal Medicine

## 2015-05-02 ENCOUNTER — Other Ambulatory Visit: Payer: Self-pay | Admitting: Internal Medicine

## 2015-05-03 ENCOUNTER — Telehealth: Payer: Self-pay | Admitting: Emergency Medicine

## 2015-05-03 NOTE — Telephone Encounter (Signed)
LVM for pt to call back. Pt needs office visit before refills can be sent in. Pt has not been seen since 6/15

## 2015-05-03 NOTE — Telephone Encounter (Signed)
appt made

## 2015-05-06 ENCOUNTER — Encounter: Payer: Self-pay | Admitting: Family

## 2015-05-07 ENCOUNTER — Ambulatory Visit (INDEPENDENT_AMBULATORY_CARE_PROVIDER_SITE_OTHER): Payer: Medicare PPO | Admitting: Family

## 2015-05-07 ENCOUNTER — Ambulatory Visit (HOSPITAL_COMMUNITY)
Admission: RE | Admit: 2015-05-07 | Discharge: 2015-05-07 | Disposition: A | Discharge status: Home / Self Care | Payer: Medicare PPO | Source: Ambulatory Visit | Attending: Family | Admitting: Family

## 2015-05-07 ENCOUNTER — Other Ambulatory Visit: Payer: Self-pay | Admitting: Emergency Medicine

## 2015-05-07 ENCOUNTER — Encounter: Payer: Self-pay | Admitting: Family

## 2015-05-07 VITALS — BP 131/80 | HR 58 | Temp 97.1°F | Resp 14 | Ht 68.0 in | Wt 162.0 lb

## 2015-05-07 DIAGNOSIS — I6523 Occlusion and stenosis of bilateral carotid arteries: Secondary | ICD-10-CM | POA: Diagnosis not present

## 2015-05-07 DIAGNOSIS — Z87891 Personal history of nicotine dependence: Secondary | ICD-10-CM

## 2015-05-07 MED ORDER — LISINOPRIL 20 MG PO TABS: 20.0000 mg | ORAL_TABLET | Freq: Every day | ORAL | Status: DC

## 2015-05-07 NOTE — Patient Instructions (Signed)
Stroke Prevention Some medical conditions and behaviors are associated with an increased chance of having a stroke. You may prevent a stroke by making healthy choices and managing medical conditions. HOW CAN I REDUCE MY RISK OF HAVING A STROKE?   Stay physically active. Get at least 30 minutes of activity on most or all days.  Do not smoke. It may also be helpful to avoid exposure to secondhand smoke.  Limit alcohol use. Moderate alcohol use is considered to be:  No more than 2 drinks per day for men.  No more than 1 drink per day for nonpregnant women.  Eat healthy foods. This involves:  Eating 5 or more servings of fruits and vegetables a day.  Making dietary changes that address high blood pressure (hypertension), high cholesterol, diabetes, or obesity.  Manage your cholesterol levels.  Making food choices that are high in fiber and low in saturated fat, trans fat, and cholesterol may control cholesterol levels.  Take any prescribed medicines to control cholesterol as directed by your health care provider.  Manage your diabetes.  Controlling your carbohydrate and sugar intake is recommended to manage diabetes.  Take any prescribed medicines to control diabetes as directed by your health care provider.  Control your hypertension.  Making food choices that are low in salt (sodium), saturated fat, trans fat, and cholesterol is recommended to manage hypertension.  Take any prescribed medicines to control hypertension as directed by your health care provider.  Maintain a healthy weight.  Reducing calorie intake and making food choices that are low in sodium, saturated fat, trans fat, and cholesterol are recommended to manage weight.  Stop drug abuse.  Avoid taking birth control pills.  Talk to your health care provider about the risks of taking birth control pills if you are over 35 years old, smoke, get migraines, or have ever had a blood clot.  Get evaluated for sleep  disorders (sleep apnea).  Talk to your health care provider about getting a sleep evaluation if you snore a lot or have excessive sleepiness.  Take medicines only as directed by your health care provider.  For some people, aspirin or blood thinners (anticoagulants) are helpful in reducing the risk of forming abnormal blood clots that can lead to stroke. If you have the irregular heart rhythm of atrial fibrillation, you should be on a blood thinner unless there is a good reason you cannot take them.  Understand all your medicine instructions.  Make sure that other conditions (such as anemia or atherosclerosis) are addressed. SEEK IMMEDIATE MEDICAL CARE IF:   You have sudden weakness or numbness of the face, arm, or leg, especially on one side of the body.  Your face or eyelid droops to one side.  You have sudden confusion.  You have trouble speaking (aphasia) or understanding.  You have sudden trouble seeing in one or both eyes.  You have sudden trouble walking.  You have dizziness.  You have a loss of balance or coordination.  You have a sudden, severe headache with no known cause.  You have new chest pain or an irregular heartbeat. Any of these symptoms may represent a serious problem that is an emergency. Do not wait to see if the symptoms will go away. Get medical help at once. Call your local emergency services (911 in [place] Do not drive yourself to the hospital. Document Released: 10/12/2004 Document Revised: 01/19/2014 Document Reviewed: 03/07/2013 Exit[employer] Patient Information 2015 [employer]. This information is not intended to replace advice given   to you by your health care provider. Make sure you discuss any questions you have with your health care provider.  

## 2015-05-07 NOTE — Addendum Note (Signed)
Addended by: Mena Goes on: 05/07/2015 05:09 PM   Modules accepted: Orders

## 2015-05-07 NOTE — Progress Notes (Signed)
Established Carotid Patient   History of Present Illness  Brian Hunt is a 69 y.o. male patient of Dr. Bridgett Larsson followed for known carotid artery stenosis.  He returns today for follow up.  Patient has not had previous carotid artery intervention.  Patient has Negative history of TIA or stroke symptom. The patient denies amaurosis fugax or monocular blindness. The patient denies facial drooping. Pt. denies hemiplegia. The patient denies receptive or expressive aphasia.   Pt reports New Medical or Surgical History: none. He had a hypertensive episode October, 2014, his PCP adjusted his hypertension medication at that time, states he has had no further blood pressure issues, that is why he stopped smoking he states. He works out in a gym 3x/week. He denies claudication symptoms with walking.  Pt Diabetic: No Pt smoker: former smoker, quit in October, 2014  Pt meds include: Statin : Yes ASA: Yes Other anticoagulants/antiplatelets: no     Past Medical History  Diagnosis Date  . Bursitis of left hip     Dr. Ihor Gully  . Prostatitis     Dr Terance Hart  . Elevated PSA   . COPD (chronic obstructive pulmonary disease)   . Hypertension   . Hyperlipidemia   . Arthritis   . Carotid artery occlusion   . Diverticulosis   . Internal hemorrhoids   . Anxiety   . Basal cell carcinoma     Basal Cell X2 , Dr Radford Pax  . Anemia     Social History Social History  Substance Use Topics  . Smoking status: Former Smoker -- 0.25 packs/day for 40 years    Types: Cigarettes    Quit date: 07/03/2013  . Smokeless tobacco: Never Used     Comment: smoked [phone], up to 1 ppd   . Alcohol Use: No     Comment: 2001- states no rehab    Family History Family History  Problem Relation Age of Onset  . Arthritis Mother   . Vascular Disease Mother     Varicose Veins  . Hypertension Mother   . Hyperlipidemia Mother   . Transient ischemic attack Mother   . Heart disease Mother     After age  15  . Stroke Mother   . Varicose Veins Mother   . Heart attack Mother   . Bone cancer Maternal Grandmother   . Heart attack Paternal Grandmother 49  . Asthma Neg Hx   . Diabetes Neg Hx   . COPD Father     Surgical History Past Surgical History  Procedure Laterality Date  . Prostate biopsy  2007 , 2009    X2 , Dr Thomasene Mohair  & Dr Terance Hart  . Colonoscopy  2010    negative; [facility]  . Green light laser turp (transurethral resection of prostate  09/27/2012    Procedure: GREEN LIGHT LASER TURP (TRANSURETHRAL RESECTION OF PROSTATE;  Surgeon: Fredricka Bonine, MD;  Location: [facility];  Service: Urology;  Laterality: N/A;     . Joint replacement Left Oct. 12, 2012    left total hip replacement by Dr. Mayer Camel  . Prostate surgery      Laser    No Known Allergies  Current Outpatient Prescriptions  Medication Sig Dispense Refill  . amLODipine (NORVASC) 5 MG tablet TAKE 1 TABLET BY MOUTH EVERY DAY 90 tablet 1  . aspirin 81 MG tablet Take 81 mg by mouth daily.    Marland Kitchen lisinopril (PRINIVIL,ZESTRIL) 20 MG tablet Take 1 tablet (20 mg total) by mouth daily. Take  2 Tablets by mouth daily--- No further refills until office visit is made. 30 tablet 0  . loratadine (CLARITIN) 10 MG tablet Take 10 mg by mouth daily.    . pravastatin (PRAVACHOL) 20 MG tablet TAKE 1 TABLET BY MOUTH DAILY 30 tablet 0  . WHEAT DEXTRIN PO Take by mouth daily.    . B Complex-C (B-COMPLEX WITH VITAMIN C) tablet Take 1 tablet by mouth daily.    Marland Kitchen FIBER PO Take 1 tablet by mouth 3 (three) times daily.    . pentoxifylline (TRENTAL) 400 MG CR tablet Take 400 mg by mouth 3 (three) times daily with meals.    . pramoxine-hydrocortisone (ANALPRAM HC) cream Apply topically 3 (three) times daily. (Patient not taking: Reported on 11/06/2014) 30 g 1   No current facility-administered medications for this visit.    Review of Systems : See HPI for pertinent positives and negatives.  Physical Examination  Filed Vitals:    05/07/15 1135 05/07/15 1136  BP: 125/73 131/80  Pulse: 57 58  Temp: 97.1 F (36.2 C)   Resp: 14   Height: 5\' 8"  (1.727 m)   Weight: 162 lb (73.483 kg)   SpO2: 99%    Body mass index is 24.64 kg/(m^2).  General: WDWN male in NAD GAIT: normal Eyes: PERRLA Pulmonary: Non-labored, CTAB, Negative Rales, Negative rhonchi, & Negative wheezing.  Cardiac: regular Rhythm, no detected murmur.  VASCULAR EXAM Carotid Bruits Right Left   negative negative   Aorta is not palpable. Radial pulses are 2+ palpable and equal.      LE Pulses Right Left   POPLITEAL not palpable  not palpable   POSTERIOR TIBIAL  palpable   palpable    DORSALIS PEDIS  ANTERIOR TIBIAL palpable  palpable     Gastrointestinal: soft, nontender, BS WNL, no r/g,no palpable masses.  Musculoskeletal: No muscle atrophy/wasting. M/S 5/5 throughout, extremities without ischemic changes.  Neurologic: A&O X 3; Appropriate Affect,  Speech is normal CN 2-12 intact, Pain and light touch intact in extremities, Motor exam as listed above.               Non-Invasive Vascular Imaging CAROTID DUPLEX 05/07/2015   CEREBROVASCULAR DUPLEX EVALUATION    INDICATION: Carotid disease    PREVIOUS INTERVENTION(S):     DUPLEX EXAM:     RIGHT  LEFT  Peak Systolic Velocities (cm/s) End Diastolic Velocities (cm/s) Plaque LOCATION Peak Systolic Velocities (cm/s) End Diastolic Velocities (cm/s) Plaque  97 20  CCA PROXIMAL 89 16   65 17  CCA MID 65 15   64 17  CCA DISTAL 54 15   68 8  ECA 61 11 CP  68 24 CP ICA PROXIMAL 215 68 CP  93 34  ICA MID 101 26   83 30  ICA DISTAL 57 19     1.1 ICA / CCA Ratio (PSV) 3.98  Antegrade Vertebral Flow Antegrade  188 Brachial Systolic Pressure (mmHg) 416  Multiphasic (subclavian artery)  Brachial Artery Waveforms Multiphasic (subclavian artery)    Plaque Morphology:  HM = Homogeneous, HT = Heterogeneous, CP = Calcific Plaque, SP = Smooth Plaque, IP = Irregular Plaque     ADDITIONAL FINDINGS: . No significant stenosis of the bilateral external or common carotid arteries. . Velocities of the left proximal internal carotid artery may be underestimated due to calcific plaque shadowing.     IMPRESSION: Doppler velocities suggest a 1-39% right proximal internal carotid artery stenosis and a 60-79% left proximal internal carotid artery stenosis,  based on limited visualization, as described above.    Compared to the previous exam:  No significant change noted when compared to the previous exam on 11/06/14.        Assessment: HANSEN CARINO is a 69 y.o. male who has no history of stroke or TIA. Today's carotid Duplex suggests 1-39% right proximal internal carotid artery stenosis and a 60-79% left proximal internal carotid artery stenosis, based on limited visualization. No significant change noted when compared to the previous exam on 11/06/14.  He is fit and exercises several times/week. Fortunately he does not have DM and stopped smoking in 2014.   Plan: Follow-up in 6 months with Carotid Duplex.   I discussed in depth with the patient the nature of atherosclerosis, and emphasized the importance of maximal medical management including strict control of blood pressure, blood glucose, and lipid levels, obtaining regular exercise, and continued cessation of smoking.  The patient is aware that without maximal medical management the underlying atherosclerotic disease process will progress, limiting the benefit of any interventions. The patient was given information about stroke prevention and what symptoms should prompt the patient to seek immediate medical care. Thank you for allowing [place] to participate in this patient's care.  Clemon Chambers, RN, MSN, FNP-C [facility] [place] Office: [phone]  Clinic Physician: Bridgett Larsson  05/07/2015 12:04 PM

## 2015-05-11 ENCOUNTER — Ambulatory Visit (INDEPENDENT_AMBULATORY_CARE_PROVIDER_SITE_OTHER): Payer: Medicare PPO | Admitting: Internal Medicine

## 2015-05-11 ENCOUNTER — Other Ambulatory Visit (INDEPENDENT_AMBULATORY_CARE_PROVIDER_SITE_OTHER): Payer: Medicare PPO

## 2015-05-11 ENCOUNTER — Encounter: Payer: Self-pay | Admitting: Internal Medicine

## 2015-05-11 VITALS — BP 136/86 | HR 54 | Temp 97.8°F | Resp 16 | Ht 68.0 in | Wt 161.0 lb

## 2015-05-11 DIAGNOSIS — I10 Essential (primary) hypertension: Secondary | ICD-10-CM

## 2015-05-11 DIAGNOSIS — E785 Hyperlipidemia, unspecified: Secondary | ICD-10-CM | POA: Diagnosis not present

## 2015-05-11 DIAGNOSIS — R739 Hyperglycemia, unspecified: Secondary | ICD-10-CM | POA: Diagnosis not present

## 2015-05-11 DIAGNOSIS — Z23 Encounter for immunization: Secondary | ICD-10-CM

## 2015-05-11 DIAGNOSIS — Z87448 Personal history of other diseases of urinary system: Secondary | ICD-10-CM

## 2015-05-11 DIAGNOSIS — Z862 Personal history of diseases of the blood and blood-forming organs and certain disorders involving the immune mechanism: Secondary | ICD-10-CM | POA: Diagnosis not present

## 2015-05-11 LAB — BASIC METABOLIC PANEL
BUN: 20 mg/dL (ref 6–23)
CHLORIDE: 96 meq/L (ref 96–112)
CO2: 29 meq/L (ref 19–32)
Calcium: 9.3 mg/dL (ref 8.4–10.5)
Creatinine, Ser: 0.85 mg/dL (ref 0.40–1.50)
GFR: 95.02 mL/min (ref >60.00)
GLUCOSE: 89 mg/dL (ref 70–99)
POTASSIUM: 5.1 meq/L (ref 3.5–5.1)
SODIUM: 129 meq/L — AB (ref 135–145)

## 2015-05-11 LAB — CBC WITH DIFFERENTIAL/PLATELET
BASOS ABS: 0.1 10*3/uL (ref 0.0–0.1)
Basophils Relative: 0.8 % (ref 0.0–3.0)
EOS PCT: 13.5 % — AB (ref 0.0–5.0)
Eosinophils Absolute: 1 10*3/uL — ABNORMAL HIGH (ref 0.0–0.7)
HEMATOCRIT: 41.9 % (ref 39.0–52.0)
Hemoglobin: 14.1 g/dL (ref 13.0–17.0)
LYMPHS ABS: 1.6 10*3/uL (ref 0.7–4.0)
LYMPHS PCT: 21.3 % (ref 12.0–46.0)
MCHC: 33.7 g/dL (ref 30.0–36.0)
MCV: 95.3 fl (ref 78.0–100.0)
MONOS PCT: 12.2 % — AB (ref 3.0–12.0)
Monocytes Absolute: 0.9 10*3/uL (ref 0.1–1.0)
NEUTROS ABS: 3.9 10*3/uL (ref 1.4–7.7)
NEUTROS PCT: 52.2 % (ref 43.0–77.0)
Platelets: 374 10*3/uL (ref 150.0–400.0)
RBC: 4.39 Mil/uL (ref 4.22–5.81)
RDW: 13.9 % (ref 11.5–15.5)
WBC: 7.5 10*3/uL (ref 4.0–10.5)

## 2015-05-11 LAB — LIPID PANEL
CHOLESTEROL: 134 mg/dL (ref 0–200)
HDL: 55.4 mg/dL (ref >39.00)
LDL Cholesterol: 71 mg/dL (ref 0–99)
NONHDL: 78.59
Total CHOL/HDL Ratio: 2
Triglycerides: 36 mg/dL (ref 0.0–149.0)
VLDL: 7.2 mg/dL (ref 0.0–40.0)

## 2015-05-11 LAB — HEPATIC FUNCTION PANEL
ALBUMIN: 4 g/dL (ref 3.5–5.2)
ALK PHOS: 59 U/L (ref 39–117)
ALT: 13 U/L (ref 0–53)
AST: 16 U/L (ref 0–37)
Bilirubin, Direct: 0.1 mg/dL (ref 0.0–0.3)
TOTAL PROTEIN: 7.3 g/dL (ref 6.0–8.3)
Total Bilirubin: 0.3 mg/dL (ref 0.2–1.2)

## 2015-05-11 LAB — TSH: TSH: 1.93 u[IU]/mL (ref 0.35–4.50)

## 2015-05-11 LAB — HEMOGLOBIN A1C: Hgb A1c MFr Bld: 5.9 % (ref 4.6–6.5)

## 2015-05-11 MED ORDER — AMLODIPINE BESYLATE 5 MG PO TABS: ORAL_TABLET | ORAL | Status: DC

## 2015-05-11 MED ORDER — LISINOPRIL 20 MG PO TABS: 20.0000 mg | ORAL_TABLET | Freq: Every day | ORAL | Status: DC

## 2015-05-11 MED ORDER — PRAVASTATIN SODIUM 20 MG PO TABS
20.0000 mg | ORAL_TABLET | Freq: Every day | ORAL | Status: DC
Start: 2015-05-11 — End: 2016-05-17

## 2015-05-11 NOTE — Assessment & Plan Note (Signed)
A1c

## 2015-05-11 NOTE — Assessment & Plan Note (Signed)
Blood pressure goals reviewed. BMET 

## 2015-05-11 NOTE — Progress Notes (Signed)
   Subjective:    Patient ID: Brian Hunt, male    DOB: February 17, 1946, 69 y.o.   MRN: [id number]  HPI The patient is here to assess status of active health conditions.  PMH, FH, & Social History reviewed & updated.No change in [place] as recorded.  He is on a modified heart healthy diet with restricted sodium. He does eat some red meats. He exercises 2-3 times per week as weights and cardio for 90 minutes without associated cardiopulmonary symptoms.  BP averages 112/72.  He has no active GI symptoms; he's had a 5-6 pound weight loss in the last few months which he relates to his exercise. Colonoscopy was negative 2010; repeat was recommended 2020.  He was evaluated by Urology in February of this year for hematuria. Cystoscopy was negative. Other than nocturia 1-2 times per night he has no active symptoms.  The carotid plaque is stable on carotid Doppler study.Repeat ordered for 2/17.    Review of Systems  Chest pain, palpitations, tachycardia, exertional dyspnea, paroxysmal nocturnal dyspnea, claudication or edema are absent. No unexplained weight loss, abdominal pain, significant dyspepsia, dysphagia, melena, rectal bleeding, or persistently small caliber stools. Dysuria, pyuria, hematuria, frequency, or polyuria are denied @ present. Change in hair, skin, nails denied. No bowel changes of constipation or diarrhea. No intolerance to heat or cold.     Objective:   Physical Exam  Pertinent or positive findings include: Pattern alopecia is present. Wax is present in both canals, greater on the left. Hearing is intact to whisper at 6 feet. Breath sounds are decreased. He has a slight rumble over the right carotid. The second heart sound is slightly increased. Posterior tibial pulses are decreased.  General appearance :adequately nourished; in no distress.  Eyes: No conjunctival inflammation or scleral icterus is present.  Oral exam:  Lips and gums are healthy appearing.There is no  oropharyngeal erythema or exudate noted. Dental hygiene is good.  Heart:  Normal rate and regular rhythm. S1 and S2 normal without gallop, murmur, click, rub or other extra sounds    Lungs:Chest clear to auscultation; no wheezes, rhonchi,rales ,or rubs present.No increased work of breathing.   Abdomen: bowel sounds normal, soft and non-tender without masses, organomegaly or hernias noted.  No guarding or rebound.   Vascular : all pulses equal ; no bruits present.  Skin:Warm & dry.  Intact without suspicious lesions or rashes ; no tenting or jaundice   Lymphatic: No lymphadenopathy is noted about the head, neck, axilla.   Neuro: Strength, tone & DTRs normal.         Assessment & Plan:  See Current Assessment & Plan in Problem List under specific Diagnosis

## 2015-05-11 NOTE — Patient Instructions (Signed)
Minimal Blood Pressure Goal= AVERAGE < 140/90;  Ideal is an AVERAGE < 135/85. This AVERAGE should be calculated from @ least 5-7 BP readings taken @ different times of day on different days of week. You should not respond to isolated BP readings , but rather the AVERAGE for that week .Please bring your  blood pressure cuff to office visits to verify that it is reliable.It  can also be checked against the blood pressure device at the pharmacy. Finger or wrist cuffs are not dependable; an arm cuff is.   Your next office appointment will be determined based upon review of your pending labs  and  xrays  Those written interpretation of the lab results and instructions will be transmitted to you by My Chart   Critical results will be called.   Followup as needed for any active or acute issue. Please report any significant change in your symptoms.

## 2015-05-11 NOTE — Progress Notes (Signed)
Pre visit review using our clinic review tool, if applicable. No additional management support is needed unless otherwise documented below in the visit note. 

## 2015-05-11 NOTE — Assessment & Plan Note (Signed)
CBC

## 2015-05-11 NOTE — Assessment & Plan Note (Signed)
Lipids, LFTs, TSH  

## 2015-05-12 DIAGNOSIS — Z87448 Personal history of other diseases of urinary system: Secondary | ICD-10-CM | POA: Insufficient documentation

## 2015-06-03 ENCOUNTER — Telehealth: Payer: Self-pay | Admitting: Emergency Medicine

## 2015-06-03 MED ORDER — LISINOPRIL 20 MG PO TABS: 20.0000 mg | ORAL_TABLET | Freq: Every day | ORAL | Status: DC

## 2015-06-03 NOTE — Telephone Encounter (Signed)
   According to the chart he is taking lisinopril 20 mg daily with excellent blood pressure control. Please verify with the patient that this is the correct dose

## 2015-06-03 NOTE — Telephone Encounter (Signed)
[facility] called to verify rx directions for Lisinopril. Please advise.

## 2015-06-03 NOTE — Telephone Encounter (Signed)
Receive call from [name]/pharmacist from [facility] needing to clarify instructions on pt lisinopril. Inform pt per ov from 8/23 pt is suppose to be taking jus 1 pill a day...Brian Hunt

## 2015-06-03 NOTE — Telephone Encounter (Signed)
Pt is only taking lisinopril 20 mg once a day. Updated chart & pharmacy already notified...Brian Hunt

## 2015-06-17 DIAGNOSIS — Z85828 Personal history of other malignant neoplasm of skin: Secondary | ICD-10-CM | POA: Diagnosis not present

## 2015-06-17 DIAGNOSIS — D1801 Hemangioma of skin and subcutaneous tissue: Secondary | ICD-10-CM | POA: Diagnosis not present

## 2015-06-17 DIAGNOSIS — L820 Inflamed seborrheic keratosis: Secondary | ICD-10-CM | POA: Diagnosis not present

## 2015-06-17 DIAGNOSIS — L821 Other seborrheic keratosis: Secondary | ICD-10-CM | POA: Diagnosis not present

## 2015-06-17 DIAGNOSIS — D225 Melanocytic nevi of trunk: Secondary | ICD-10-CM | POA: Diagnosis not present

## 2015-06-23 DIAGNOSIS — H3530 Unspecified macular degeneration: Secondary | ICD-10-CM | POA: Diagnosis not present

## 2015-06-23 DIAGNOSIS — H25011 Cortical age-related cataract, right eye: Secondary | ICD-10-CM | POA: Diagnosis not present

## 2015-06-23 DIAGNOSIS — H25012 Cortical age-related cataract, left eye: Secondary | ICD-10-CM | POA: Diagnosis not present

## 2015-06-23 DIAGNOSIS — H1010 Acute atopic conjunctivitis, unspecified eye: Secondary | ICD-10-CM | POA: Diagnosis not present

## 2015-11-04 DIAGNOSIS — H6123 Impacted cerumen, bilateral: Secondary | ICD-10-CM | POA: Insufficient documentation

## 2015-11-04 DIAGNOSIS — H900 Conductive hearing loss, bilateral: Secondary | ICD-10-CM | POA: Insufficient documentation

## 2015-11-05 ENCOUNTER — Encounter: Payer: Self-pay | Admitting: Family

## 2015-11-12 ENCOUNTER — Encounter: Payer: Self-pay | Admitting: Family

## 2015-11-12 ENCOUNTER — Ambulatory Visit (HOSPITAL_COMMUNITY)
Admission: RE | Admit: 2015-11-12 | Discharge: 2015-11-12 | Disposition: A | Discharge status: Home / Self Care | Payer: Medicare Other | Source: Ambulatory Visit | Attending: Family | Admitting: Family

## 2015-11-12 ENCOUNTER — Ambulatory Visit (INDEPENDENT_AMBULATORY_CARE_PROVIDER_SITE_OTHER): Payer: Medicare Other | Admitting: Family

## 2015-11-12 VITALS — BP 132/80 | HR 57 | Temp 97.3°F | Resp 14 | Ht 67.5 in | Wt 155.0 lb

## 2015-11-12 DIAGNOSIS — I6523 Occlusion and stenosis of bilateral carotid arteries: Secondary | ICD-10-CM | POA: Diagnosis present

## 2015-11-12 DIAGNOSIS — Z87891 Personal history of nicotine dependence: Secondary | ICD-10-CM | POA: Insufficient documentation

## 2015-11-12 DIAGNOSIS — E785 Hyperlipidemia, unspecified: Secondary | ICD-10-CM | POA: Insufficient documentation

## 2015-11-12 DIAGNOSIS — I10 Essential (primary) hypertension: Secondary | ICD-10-CM | POA: Diagnosis not present

## 2015-11-12 NOTE — Patient Instructions (Signed)
Stroke Prevention Some medical conditions and behaviors are associated with an increased chance of having a stroke. You may prevent a stroke by making healthy choices and managing medical conditions. HOW CAN I REDUCE MY RISK OF HAVING A STROKE?   Stay physically active. Get at least 30 minutes of activity on most or all days.  Do not smoke. It may also be helpful to avoid exposure to secondhand smoke.  Limit alcohol use. Moderate alcohol use is considered to be:  No more than 2 drinks per day for men.  No more than 1 drink per day for nonpregnant women.  Eat healthy foods. This involves:  Eating 5 or more servings of fruits and vegetables a day.  Making dietary changes that address high blood pressure (hypertension), high cholesterol, diabetes, or obesity.  Manage your cholesterol levels.  Making food choices that are high in fiber and low in saturated fat, trans fat, and cholesterol may control cholesterol levels.  Take any prescribed medicines to control cholesterol as directed by your health care provider.  Manage your diabetes.  Controlling your carbohydrate and sugar intake is recommended to manage diabetes.  Take any prescribed medicines to control diabetes as directed by your health care provider.  Control your hypertension.  Making food choices that are low in salt (sodium), saturated fat, trans fat, and cholesterol is recommended to manage hypertension.  Ask your health care provider if you need treatment to lower your blood pressure. Take any prescribed medicines to control hypertension as directed by your health care provider.  If you are 18-39 years of age, have your blood pressure checked every 3-5 years. If you are 40 years of age or older, have your blood pressure checked every year.  Maintain a healthy weight.  Reducing calorie intake and making food choices that are low in sodium, saturated fat, trans fat, and cholesterol are recommended to manage  weight.  Stop drug abuse.  Avoid taking birth control pills.  Talk to your health care provider about the risks of taking birth control pills if you are over 35 years old, smoke, get migraines, or have ever had a blood clot.  Get evaluated for sleep disorders (sleep apnea).  Talk to your health care provider about getting a sleep evaluation if you snore a lot or have excessive sleepiness.  Take medicines only as directed by your health care provider.  For some people, aspirin or blood thinners (anticoagulants) are helpful in reducing the risk of forming abnormal blood clots that can lead to stroke. If you have the irregular heart rhythm of atrial fibrillation, you should be on a blood thinner unless there is a good reason you cannot take them.  Understand all your medicine instructions.  Make sure that other conditions (such as anemia or atherosclerosis) are addressed. SEEK IMMEDIATE MEDICAL CARE IF:   You have sudden weakness or numbness of the face, arm, or leg, especially on one side of the body.  Your face or eyelid droops to one side.  You have sudden confusion.  You have trouble speaking (aphasia) or understanding.  You have sudden trouble seeing in one or both eyes.  You have sudden trouble walking.  You have dizziness.  You have a loss of balance or coordination.  You have a sudden, severe headache with no known cause.  You have new chest pain or an irregular heartbeat. Any of these symptoms may represent a serious problem that is an emergency. Do not wait to see if the symptoms will   go away. Get medical help at once. Call your local emergency services (911 in [place] Do not drive yourself to the hospital.   This information is not intended to replace advice given to you by your health care provider. Make sure you discuss any questions you have with your health care provider.   Document Released: 10/12/2004 Document Revised: 09/25/2014 Document Reviewed:  03/07/2013 Elsevier Interactive Patient Education 2016 Elsevier Inc.  

## 2015-11-12 NOTE — Progress Notes (Signed)
Chief Complaint: Extracranial Carotid Artery Stenosis   History of Present Illness  Brian Hunt is a 70 y.o. male patient of Dr. Bridgett Larsson followed for known extracranial carotid artery stenosis.  He returns today for follow up.  Patient has not had previous carotid artery intervention.  The patient denies any history of TIA or stroke symptoms.specifically he denies any history of amaurosis fugax or monocular blindness, unilateral facial drooping, hemiparesis, or receptive or expressive aphasia.   Pt reports New Medical or Surgical History: none. He had a hypertensive episode October, 2014, his PCP adjusted his hypertension medication at that time, states he has had no further blood pressure issues, that is why he stopped smoking he states. He works out in a gym 3x/week. He denies claudication symptoms with walking.  Pt Diabetic: No Pt smoker: former smoker, quit in October, 2014  Pt meds include: Statin : Yes ASA: Yes Other anticoagulants/antiplatelets: no    Past Medical History  Diagnosis Date  . Bursitis of left hip     Dr. Ihor Gully  . Prostatitis     Dr Terance Hart  . Elevated PSA   . COPD (chronic obstructive pulmonary disease)   . Hypertension   . Hyperlipidemia   . Arthritis   . Carotid artery occlusion   . Diverticulosis   . Internal hemorrhoids   . Anxiety   . Basal cell carcinoma     Basal Cell X2 , Dr Radford Pax  . Anemia     Social History Social History  Substance Use Topics  . Smoking status: Former Smoker -- 0.25 packs/day for 40 years    Types: Cigarettes    Quit date: 07/03/2013  . Smokeless tobacco: Never Used     Comment: smoked [phone], up to 1 ppd   . Alcohol Use: No     Comment: 2001- states no rehab    Family History Family History  Problem Relation Age of Onset  . Arthritis Mother   . Vascular Disease Mother     Varicose Veins  . Hypertension Mother   . Hyperlipidemia Mother   . Transient ischemic attack Mother   . Heart disease  Mother     After age 56  . Stroke Mother   . Varicose Veins Mother   . Heart attack Mother   . Bone cancer Maternal Grandmother   . Heart attack Paternal Grandmother 51  . Asthma Neg Hx   . Diabetes Neg Hx   . COPD Father     Surgical History Past Surgical History  Procedure Laterality Date  . Prostate biopsy  2007 , 2009    X2 , Dr Thomasene Mohair  & Dr Terance Hart  . Colonoscopy  2010    negative; [facility]  . Green light laser turp (transurethral resection of prostate  09/27/2012    Procedure: GREEN LIGHT LASER TURP (TRANSURETHRAL RESECTION OF PROSTATE;  Surgeon: Fredricka Bonine, MD;  Location: [facility];  Service: Urology;  Laterality: N/A;     . Joint replacement Left Oct. 12, 2012    left total hip replacement by Dr. Mayer Camel  . Prostate surgery      Laser  . Cystoscopy  10/2014    [facility]; neg    No Known Allergies  Current Outpatient Prescriptions  Medication Sig Dispense Refill  . amLODipine (NORVASC) 5 MG tablet TAKE 1 TABLET BY MOUTH EVERY DAY 90 tablet 3  . aspirin 81 MG tablet Take 81 mg by mouth daily.    Marland Kitchen FIBER PO Take  1 tablet by mouth 3 (three) times daily.    Marland Kitchen lisinopril (PRINIVIL,ZESTRIL) 20 MG tablet Take 1 tablet (20 mg total) by mouth daily. 90 tablet 3  . loratadine (CLARITIN) 10 MG tablet Take 10 mg by mouth daily.    . pravastatin (PRAVACHOL) 20 MG tablet Take 1 tablet (20 mg total) by mouth daily. 90 tablet 3   No current facility-administered medications for this visit.    Review of Systems : See HPI for pertinent positives and negatives.  Physical Examination  Filed Vitals:   11/12/15 1014 11/12/15 1016  BP: 127/77 132/80  Pulse: 57 57  Temp:  97.3 F (36.3 C)  TempSrc:  Oral  Resp:  14  Height:  5' 7.5" (1.715 m)  Weight:  155 lb (70.308 kg)  SpO2:  100%   Body mass index is 23.9 kg/(m^2).   General: WDWN male in NAD GAIT: normal Eyes: PERRLA Pulmonary: Non-labored, CTAB.  Cardiac: regular rhythm, no detected  murmur.  VASCULAR EXAM Carotid Bruits Right Left   positive Faint bruit   Aorta is not palpable. Radial pulses are 2+ palpable and equal.      LE Pulses Right Left   POPLITEAL not palpable  not palpable   POSTERIOR TIBIAL  palpable   palpable    DORSALIS PEDIS  ANTERIOR TIBIAL palpable  palpable     Gastrointestinal: soft, nontender, BS WNL, no r/g,no palpable masses.  Musculoskeletal: No muscle atrophy/wasting. M/S 5/5 throughout, extremities without ischemic changes.  Neurologic: A&O X 3; Appropriate Affect,  Speech is normal CN 2-12 intact, Pain and light touch intact in extremities, Motor exam as listed above.                     Non-Invasive Vascular Imaging CAROTID DUPLEX 11/12/2015   CEREBROVASCULAR DUPLEX EVALUATION    INDICATION: Carotid artery disease    PREVIOUS INTERVENTION(S): None    DUPLEX EXAM: Carotid duplex    RIGHT  LEFT  Peak Systolic Velocities (cm/s) End Diastolic Velocities (cm/s) Plaque LOCATION Peak Systolic Velocities (cm/s) End Diastolic Velocities (cm/s) Plaque  92 21  CCA PROXIMAL 96 24   58 15  CCA MID 62 17   47 15 HT CCA DISTAL 70 17 HT  72 11 CP ECA 87 18 CP  68 24 CP ICA PROXIMAL 196 71 CP  79 30  ICA MID 85 24   74 27  ICA DISTAL 57 20     1.1 ICA / CCA Ratio (PSV) 3.1  Antegrade Vertebral Flow Antegrade  - Brachial Systolic Pressure (mmHg) -  Triphasic Brachial Artery Waveforms Triphasic    Plaque Morphology:  HM = Homogeneous, HT = Heterogeneous, CP = Calcific Plaque, SP = Smooth Plaque, IP = Irregular Plaque     ADDITIONAL FINDINGS: Multiphasic subclavian arteries    IMPRESSION: 1. Less than 40% right internal carotid artery stenosis. 2. 60 - 79% left internal carotid artery stenosis, calcific plaque may  obscure higher velocity    Compared to the previous exam:  No change since exam of 05/07/2015       Assessment: Brian Hunt is a 70 y.o. male who has no history of stroke or TIA.  Today's carotid Duplex suggests less than 40% right internal carotid artery stenosis and 60 - 79% left internal carotid artery stenosis. No change since exam of 05/07/2015.  His atherosclerotic risk factors include smoke up until 2014. Fortunately he does not have DM, has a normal BMI,  and exercises  regularly.     Plan: Follow-up in 6 months with Carotid Duplex scan.   I discussed in depth with the patient the nature of atherosclerosis, and emphasized the importance of maximal medical management including strict control of blood pressure, blood glucose, and lipid levels, obtaining regular exercise, and continued cessation of smoking.  The patient is aware that without maximal medical management the underlying atherosclerotic disease process will progress, limiting the benefit of any interventions. The patient was given information about stroke prevention and what symptoms should prompt the patient to seek immediate medical care. Thank you for allowing [place] to participate in this patient's care.  Clemon Chambers, RN, MSN, FNP-C [facility] [place] Office: [phone]  Clinic Physician: Bridgett Larsson  11/12/2015 10:04 AM

## 2015-12-17 ENCOUNTER — Encounter: Payer: Self-pay | Admitting: Family

## 2015-12-17 ENCOUNTER — Telehealth: Payer: Self-pay | Admitting: *Deleted

## 2015-12-17 NOTE — Telephone Encounter (Signed)
Left msg on triage stating Dr. Linna Darner told him to increase his Lisinopril to 1 1/2 tab a day, pharmacy is needing updated scripts. Called pt back no answer LMOM per chart it states to take 1 pill a day, and since Dr. Linna Darner is no longer here will have to get set-up to come in to see new provider before we can send refill...Brian Hunt

## 2015-12-20 ENCOUNTER — Telehealth: Payer: Self-pay | Admitting: Internal Medicine

## 2015-12-20 DIAGNOSIS — I10 Essential (primary) hypertension: Secondary | ICD-10-CM

## 2015-12-20 NOTE — Telephone Encounter (Signed)
Patient is requesting to transfer from [place] to [place].  Please advise.   Patient also is taking lisinopril 20 mg.  On 8/23 Dr. Linna Darner told patient to take 1 1/2 tablets of this medication.  On his AVS Dr. Linna Darner has listed for patient to take 2 tablets but in our system we still have 1 tablet.  Patient is currently running out of this medication.  He is requesting a script to be sent to [facility] on [address] and [address].  Patient is going out of town for 12 days on Thursday and would like script sent in if possible before then.  Please advise.

## 2015-12-20 NOTE — Telephone Encounter (Signed)
He needs to be seen

## 2015-12-21 NOTE — Telephone Encounter (Signed)
I have schedule patient to transfer on 4/27.  Patient will not be able to come in before he goes out of town on Thursday.  He would like to know if a small script could be sent to his pharmacy to get him through until this appt time?

## 2015-12-22 MED ORDER — AMLODIPINE BESYLATE 5 MG PO TABS: ORAL_TABLET | ORAL | Status: DC

## 2015-12-22 MED ORDER — LISINOPRIL 20 MG PO TABS: 40.0000 mg | ORAL_TABLET | Freq: Every day | ORAL | Status: DC

## 2015-12-22 NOTE — Telephone Encounter (Signed)
Rx sent 

## 2015-12-22 NOTE — Telephone Encounter (Signed)
Pt informed

## 2016-01-13 ENCOUNTER — Encounter: Payer: Self-pay | Admitting: Internal Medicine

## 2016-01-13 ENCOUNTER — Ambulatory Visit (INDEPENDENT_AMBULATORY_CARE_PROVIDER_SITE_OTHER)
Admission: RE | Admit: 2016-01-13 | Discharge: 2016-01-13 | Disposition: A | Discharge status: Home / Self Care | Payer: Medicare Other | Source: Ambulatory Visit | Attending: Internal Medicine | Admitting: Internal Medicine

## 2016-01-13 ENCOUNTER — Other Ambulatory Visit (INDEPENDENT_AMBULATORY_CARE_PROVIDER_SITE_OTHER): Payer: Medicare Other

## 2016-01-13 ENCOUNTER — Ambulatory Visit (INDEPENDENT_AMBULATORY_CARE_PROVIDER_SITE_OTHER): Payer: Medicare Other | Admitting: Internal Medicine

## 2016-01-13 VITALS — BP 120/78 | HR 66 | Temp 97.6°F | Resp 16 | Ht 67.5 in | Wt 158.0 lb

## 2016-01-13 DIAGNOSIS — J410 Simple chronic bronchitis: Secondary | ICD-10-CM | POA: Insufficient documentation

## 2016-01-13 DIAGNOSIS — R059 Cough, unspecified: Secondary | ICD-10-CM

## 2016-01-13 DIAGNOSIS — Z1211 Encounter for screening for malignant neoplasm of colon: Secondary | ICD-10-CM | POA: Insufficient documentation

## 2016-01-13 DIAGNOSIS — Z0000 Encounter for general adult medical examination without abnormal findings: Secondary | ICD-10-CM | POA: Insufficient documentation

## 2016-01-13 DIAGNOSIS — I10 Essential (primary) hypertension: Secondary | ICD-10-CM

## 2016-01-13 DIAGNOSIS — I6523 Occlusion and stenosis of bilateral carotid arteries: Secondary | ICD-10-CM | POA: Diagnosis not present

## 2016-01-13 DIAGNOSIS — E871 Hypo-osmolality and hyponatremia: Secondary | ICD-10-CM

## 2016-01-13 DIAGNOSIS — R05 Cough: Secondary | ICD-10-CM

## 2016-01-13 DIAGNOSIS — Z1159 Encounter for screening for other viral diseases: Secondary | ICD-10-CM

## 2016-01-13 DIAGNOSIS — E785 Hyperlipidemia, unspecified: Secondary | ICD-10-CM

## 2016-01-13 DIAGNOSIS — R739 Hyperglycemia, unspecified: Secondary | ICD-10-CM | POA: Diagnosis not present

## 2016-01-13 LAB — CBC WITH DIFFERENTIAL/PLATELET
BASOS PCT: 0.6 % (ref 0.0–3.0)
Basophils Absolute: 0 10*3/uL (ref 0.0–0.1)
EOS PCT: 14.8 % — AB (ref 0.0–5.0)
Eosinophils Absolute: 1.2 10*3/uL — ABNORMAL HIGH (ref 0.0–0.7)
HCT: 39.9 % (ref 39.0–52.0)
HEMOGLOBIN: 13.5 g/dL (ref 13.0–17.0)
LYMPHS ABS: 1.5 10*3/uL (ref 0.7–4.0)
Lymphocytes Relative: 18.7 % (ref 12.0–46.0)
MCHC: 33.8 g/dL (ref 30.0–36.0)
MCV: 94.2 fl (ref 78.0–100.0)
MONOS PCT: 12.5 % — AB (ref 3.0–12.0)
Monocytes Absolute: 1 10*3/uL (ref 0.1–1.0)
NEUTROS PCT: 53.4 % (ref 43.0–77.0)
Neutro Abs: 4.2 10*3/uL (ref 1.4–7.7)
Platelets: 316 10*3/uL (ref 150.0–400.0)
RBC: 4.23 Mil/uL (ref 4.22–5.81)
RDW: 13.8 % (ref 11.5–15.5)
WBC: 7.9 10*3/uL (ref 4.0–10.5)

## 2016-01-13 LAB — BASIC METABOLIC PANEL
BUN: 14 mg/dL (ref 6–23)
CHLORIDE: 95 meq/L — AB (ref 96–112)
CO2: 28 mEq/L (ref 19–32)
Calcium: 9.2 mg/dL (ref 8.4–10.5)
Creatinine, Ser: 0.83 mg/dL (ref 0.40–1.50)
GFR: 97.48 mL/min (ref >60.00)
GLUCOSE: 78 mg/dL (ref 70–99)
POTASSIUM: 4.6 meq/L (ref 3.5–5.1)
SODIUM: 128 meq/L — AB (ref 135–145)

## 2016-01-13 LAB — CK: Total CK: 204 U/L (ref 7–232)

## 2016-01-13 MED ORDER — TELMISARTAN 40 MG PO TABS: 40.0000 mg | ORAL_TABLET | Freq: Every day | ORAL | Status: DC

## 2016-01-13 MED ORDER — AMLODIPINE BESYLATE 5 MG PO TABS: ORAL_TABLET | ORAL | Status: DC

## 2016-01-13 NOTE — Progress Notes (Signed)
Subjective:  Patient ID: Brian Hunt, male    DOB: Jan 25, 1946  Age: 70 y.o. MRN: [id number]  CC: Hypertension; Hyperlipidemia; and Cough   HPI Brian Hunt presents for a blood pressure check. He was not willing to do a complete physical today. He tells me his blood pressure hass been well controlled and he denies any episodes of chest pain, shortness of breath, edema, dyspnea on exertion, headache, blurred vision, or palpitations. He does report a chronic lingering nonproductive cough and runny nose. He also thinks that he has lost some weight over the last year but  ourI records show that over the last 2 months he has gained about 3 pounds. He denies odynophagia, dysphagia, abdominal pain, nausea, vomiting, loss of appetite, leading the stool, diarrhea, or constipation.  History Brian Hunt has a past medical history of Bursitis of left hip; Prostatitis; Elevated PSA; COPD (chronic obstructive pulmonary disease) ([place]); Hypertension; Hyperlipidemia; Arthritis; Carotid artery occlusion; Diverticulosis; Internal hemorrhoids; Anxiety; Basal cell carcinoma; and Anemia.   He has past surgical history that includes Prostate biopsy (2007 , 2009); Colonoscopy (2010); Green light laser turp (transurethral resection of prostate (09/27/2012); Joint replacement (Left, Oct. 12, 2012); Prostate surgery; and Cystoscopy (10/2014).   His family history includes Arthritis in his mother; Bone cancer in his maternal grandmother; COPD in his father; Heart attack in his mother; Heart attack (age of onset: 81) in his paternal grandmother; Heart disease in his mother; Hyperlipidemia in his mother; Hypertension in his mother; Stroke in his mother; Transient ischemic attack in his mother; Varicose Veins in his mother; Vascular Disease in his mother. There is no history of Asthma or Diabetes.He reports that he quit smoking about 2 years ago. His smoking use included Cigarettes. He has a 10 pack-year smoking history. He has never  used smokeless tobacco. He reports that he does not drink alcohol or use illicit drugs.  Outpatient Prescriptions Prior to Visit  Medication Sig Dispense Refill  . aspirin 81 MG tablet Take 81 mg by mouth daily.    Marland Kitchen FIBER PO Take 1 tablet by mouth 3 (three) times daily.    Marland Kitchen loratadine (CLARITIN) 10 MG tablet Take 10 mg by mouth daily.    . pravastatin (PRAVACHOL) 20 MG tablet Take 1 tablet (20 mg total) by mouth daily. 90 tablet 3  . amLODipine (NORVASC) 5 MG tablet TAKE 1 TABLET BY MOUTH EVERY DAY 30 tablet 0  . lisinopril (PRINIVIL,ZESTRIL) 20 MG tablet Take 2 tablets (40 mg total) by mouth daily. (Patient taking differently: Take 40 mg by mouth daily. Pt reports taking 1 tablet in the morning and 1/2 tablet in the PM) 60 tablet 0   No facility-administered medications prior to visit.    ROS Review of Systems  Constitutional: Positive for unexpected weight change. Negative for fever, chills, diaphoresis, activity change, appetite change and fatigue.  HENT: Positive for rhinorrhea. Negative for facial swelling, sinus pressure, sneezing, sore throat, tinnitus, trouble swallowing and voice change.   Eyes: Negative.   Respiratory: Positive for cough. Negative for apnea, choking, chest tightness, shortness of breath, wheezing and stridor.   Cardiovascular: Negative.  Negative for chest pain, palpitations and leg swelling.  Gastrointestinal: Negative.  Negative for nausea, vomiting, abdominal pain, diarrhea and constipation.  Endocrine: Negative.   Genitourinary: Negative.   Musculoskeletal: Negative.  Negative for myalgias, back pain, joint swelling and arthralgias.  Skin: Negative.  Negative for color change and rash.  Allergic/Immunologic: Negative.   Neurological: Negative.  Negative for  dizziness, weakness and light-headedness.  Hematological: Negative.  Negative for adenopathy. Does not bruise/bleed easily.  Psychiatric/Behavioral: Negative.     Objective:  BP 120/78 mmHg  Pulse  66  Temp(Src) 97.6 F (36.4 C) (Oral)  Resp 16  Ht 5' 7.5" (1.715 m)  Wt 158 lb (71.668 kg)  BMI 24.37 kg/m2  SpO2 95%  Physical Exam  Constitutional: He is oriented to person, place, and time. No distress.  HENT:  Mouth/Throat: Oropharynx is clear and moist. No oropharyngeal exudate.  Eyes: Conjunctivae are normal. Right eye exhibits no discharge. Left eye exhibits no discharge. No scleral icterus.  Neck: Normal range of motion. Neck supple. No JVD present. No tracheal deviation present. No thyromegaly present.  Cardiovascular: Normal rate, regular rhythm, normal heart sounds and intact distal pulses.  Exam reveals no gallop and no friction rub.   No murmur heard. Pulmonary/Chest: Effort normal and breath sounds normal. No accessory muscle usage or stridor. No respiratory distress. He has no decreased breath sounds. He has no wheezes. He has no rhonchi. He has no rales. He exhibits no tenderness.  Abdominal: Soft. Bowel sounds are normal. He exhibits no distension and no mass. There is no tenderness. There is no rebound and no guarding.  Musculoskeletal: Normal range of motion. He exhibits no edema or tenderness.  Lymphadenopathy:    He has no cervical adenopathy.  Neurological: He is oriented to person, place, and time.  Skin: Skin is warm and dry. No rash noted. He is not diaphoretic. No erythema. No pallor.  Vitals reviewed.   Lab Results  Component Value Date   WBC 7.9 01/13/2016   HGB 13.5 01/13/2016   HCT 39.9 01/13/2016   PLT 316.0 01/13/2016   GLUCOSE 78 01/13/2016   CHOL 134 05/11/2015   TRIG 36.0 05/11/2015   HDL 55.40 05/11/2015   LDLCALC 71 05/11/2015   ALT 13 05/11/2015   AST 16 05/11/2015   NA 128* 01/13/2016   K 4.6 01/13/2016   CL 95* 01/13/2016   CREATININE 0.83 01/13/2016   BUN 14 01/13/2016   CO2 28 01/13/2016   TSH 1.93 05/11/2015   INR 1.41 05/28/2011   HGBA1C 5.9 05/11/2015    Assessment & Plan:   Brian Hunt was seen today for hypertension,  hyperlipidemia and cough.  Diagnoses and all orders for this visit:  Carotid artery stenosis, bilateral- will prescribe an ARB for risk reduction -     telmisartan (MICARDIS) 40 MG tablet; Take 1 tablet (40 mg total) by mouth daily.  Essential hypertension- his blood pressures adequately well controlled but he has a chronic cough that I think is related to the ACE inhibitor, he will continue amlodipine but will change to an ARB. -     amLODipine (NORVASC) 5 MG tablet; TAKE 1 TABLET BY MOUTH EVERY DAY -     telmisartan (MICARDIS) 40 MG tablet; Take 1 tablet (40 mg total) by mouth daily. -     Basic metabolic panel; Future -     CBC with Differential/Platelet; Future  Hyperglycemia- his A1c is 5.9%, he is prediabetic, no medications are needed at this time. -     Basic metabolic panel; Future  Hyperlipidemia with target LDL less than 70- he is doing well on the statin and has achieved his LDL goal, his CPK is normal -     CK; Future  Routine general medical examination at a health care facility  Need for hepatitis C screening test  Hyponatremia- he has had chronic hyponatremia  for many years, his chest x-ray is normal and I do not see any secondary causes for this. I think this is just his normal sodium level. -     DG Chest 2 View; Future -     Basic metabolic panel; Future  Cough- chest x-rays normal, his lung exam is normal, I think he has chronic bronchitis but he is not willing to treat it, will discontinue the ACE inhibitor. -     DG Chest 2 View; Future  Simple chronic bronchitis ([place])- he has a long history of tobacco abuse and I think he is developing chronic bronchitis, he is not willing to use an inhaler at this time.  Other orders -     Cancel: lisinopril (PRINIVIL,ZESTRIL) 20 MG tablet; Take 2 tablets (40 mg total) by mouth daily. Pt reports taking 1 tablet in the morning and 1/2 tablet in the PM   I have discontinued Brian Hunt's lisinopril. I am also having him start  on telmisartan. Additionally, I am having him maintain his aspirin, FIBER PO, loratadine, pravastatin, and amLODipine.  Meds ordered this encounter  Medications  . amLODipine (NORVASC) 5 MG tablet    Sig: TAKE 1 TABLET BY MOUTH EVERY DAY    Dispense:  90 tablet    Refill:  1  . telmisartan (MICARDIS) 40 MG tablet    Sig: Take 1 tablet (40 mg total) by mouth daily.    Dispense:  90 tablet    Refill:  3     Follow-up: Return in about 3 months (around 04/13/2016).  Scarlette Calico, MD

## 2016-01-13 NOTE — Progress Notes (Signed)
Pre visit review using our clinic review tool, if applicable. No additional management support is needed unless otherwise documented below in the visit note. 

## 2016-01-13 NOTE — Patient Instructions (Signed)

## 2016-01-25 ENCOUNTER — Other Ambulatory Visit: Payer: Self-pay | Admitting: Internal Medicine

## 2016-05-10 ENCOUNTER — Other Ambulatory Visit: Payer: Self-pay | Admitting: *Deleted

## 2016-05-10 DIAGNOSIS — I6523 Occlusion and stenosis of bilateral carotid arteries: Secondary | ICD-10-CM

## 2016-05-12 ENCOUNTER — Encounter: Payer: Self-pay | Admitting: Family

## 2016-05-15 ENCOUNTER — Ambulatory Visit (HOSPITAL_COMMUNITY)
Admission: RE | Admit: 2016-05-15 | Discharge: 2016-05-15 | Disposition: A | Discharge status: Home / Self Care | Payer: Medicare Other | Source: Ambulatory Visit | Attending: Family | Admitting: Family

## 2016-05-15 ENCOUNTER — Ambulatory Visit (INDEPENDENT_AMBULATORY_CARE_PROVIDER_SITE_OTHER): Payer: Medicare Other | Admitting: Family

## 2016-05-15 ENCOUNTER — Encounter: Payer: Self-pay | Admitting: Family

## 2016-05-15 VITALS — BP 163/89 | HR 56 | Temp 97.2°F | Resp 16 | Ht 67.5 in | Wt 158.0 lb

## 2016-05-15 DIAGNOSIS — I6523 Occlusion and stenosis of bilateral carotid arteries: Secondary | ICD-10-CM

## 2016-05-15 DIAGNOSIS — Z87891 Personal history of nicotine dependence: Secondary | ICD-10-CM | POA: Diagnosis not present

## 2016-05-15 LAB — VAS US CAROTID
LCCADSYS: -52 cm/s
LCCAPDIAS: 14 cm/s
LCCAPSYS: 82 cm/s
LEFT ECA DIAS: -9 cm/s
LICADDIAS: -21 cm/s
Left CCA dist dias: -11 cm/s
Left ICA dist sys: -62 cm/s
Left ICA prox dias: -71 cm/s
Left ICA prox sys: -241 cm/s
RCCADSYS: -84 cm/s
RCCAPDIAS: 11 cm/s
RIGHT CCA MID DIAS: 14 cm/s
RIGHT ECA DIAS: -9 cm/s
Right CCA prox sys: 72 cm/s

## 2016-05-15 NOTE — Patient Instructions (Signed)
Stroke Prevention Some medical conditions and behaviors are associated with an increased chance of having a stroke. You may prevent a stroke by making healthy choices and managing medical conditions. HOW CAN I REDUCE MY RISK OF HAVING A STROKE?   Stay physically active. Get at least 30 minutes of activity on most or all days.  Do not smoke. It may also be helpful to avoid exposure to secondhand smoke.  Limit alcohol use. Moderate alcohol use is considered to be:  No more than 2 drinks per day for men.  No more than 1 drink per day for nonpregnant women.  Eat healthy foods. This involves:  Eating 5 or more servings of fruits and vegetables a day.  Making dietary changes that address high blood pressure (hypertension), high cholesterol, diabetes, or obesity.  Manage your cholesterol levels.  Making food choices that are high in fiber and low in saturated fat, trans fat, and cholesterol may control cholesterol levels.  Take any prescribed medicines to control cholesterol as directed by your health care provider.  Manage your diabetes.  Controlling your carbohydrate and sugar intake is recommended to manage diabetes.  Take any prescribed medicines to control diabetes as directed by your health care provider.  Control your hypertension.  Making food choices that are low in salt (sodium), saturated fat, trans fat, and cholesterol is recommended to manage hypertension.  Ask your health care provider if you need treatment to lower your blood pressure. Take any prescribed medicines to control hypertension as directed by your health care provider.  If you are 18-39 years of age, have your blood pressure checked every 3-5 years. If you are 40 years of age or older, have your blood pressure checked every year.  Maintain a healthy weight.  Reducing calorie intake and making food choices that are low in sodium, saturated fat, trans fat, and cholesterol are recommended to manage  weight.  Stop drug abuse.  Avoid taking birth control pills.  Talk to your health care provider about the risks of taking birth control pills if you are over 35 years old, smoke, get migraines, or have ever had a blood clot.  Get evaluated for sleep disorders (sleep apnea).  Talk to your health care provider about getting a sleep evaluation if you snore a lot or have excessive sleepiness.  Take medicines only as directed by your health care provider.  For some people, aspirin or blood thinners (anticoagulants) are helpful in reducing the risk of forming abnormal blood clots that can lead to stroke. If you have the irregular heart rhythm of atrial fibrillation, you should be on a blood thinner unless there is a good reason you cannot take them.  Understand all your medicine instructions.  Make sure that other conditions (such as anemia or atherosclerosis) are addressed. SEEK IMMEDIATE MEDICAL CARE IF:   You have sudden weakness or numbness of the face, arm, or leg, especially on one side of the body.  Your face or eyelid droops to one side.  You have sudden confusion.  You have trouble speaking (aphasia) or understanding.  You have sudden trouble seeing in one or both eyes.  You have sudden trouble walking.  You have dizziness.  You have a loss of balance or coordination.  You have a sudden, severe headache with no known cause.  You have new chest pain or an irregular heartbeat. Any of these symptoms may represent a serious problem that is an emergency. Do not wait to see if the symptoms will   go away. Get medical help at once. Call your local emergency services (911 in [place] Do not drive yourself to the hospital.   This information is not intended to replace advice given to you by your health care provider. Make sure you discuss any questions you have with your health care provider.   Document Released: 10/12/2004 Document Revised: 09/25/2014 Document Reviewed:  03/07/2013 Elsevier Interactive Patient Education 2016 Elsevier Inc.  

## 2016-05-15 NOTE — Progress Notes (Signed)
Chief Complaint: Follow up Extracranial Carotid Artery Stenosis   History of Present Illness  Brian Hunt is a 70 y.o. male patient of Dr. Bridgett Larsson followed for known extracranial carotid artery stenosis.  He returns today for follow up.  Patient has not had previous carotid artery intervention.  The patient denies any history of TIA or stroke symptoms.Specifically he denies any history of amaurosis fugax or monocular blindness, unilateral facial drooping, hemiparesis, or receptive or expressive aphasia.   Pt reports New Medical or Surgical History: none. He had a hypertensive episode October, 2014, his PCP adjusted his hypertension medication at that time, states he has had no further blood pressure issues, that is why he stopped smoking he states. He states his blood pressure at home runs about 125/75. He denies chest pain or dyspnea. He works out in a gym 3x/week. He denies claudication symptoms with walking.  Pt Diabetic: No Pt smoker: former smoker, quit in October, 2014  Pt meds include: Statin : Yes ASA: Yes Other anticoagulants/antiplatelets: no   Past Medical History:  Diagnosis Date  . Anemia   . Anxiety   . Arthritis   . Basal cell carcinoma    Basal Cell X2 , Dr Radford Pax  . Bursitis of left hip    Dr. Ihor Gully  . Carotid artery occlusion   . COPD (chronic obstructive pulmonary disease) ([place])   . Diverticulosis   . Elevated PSA   . Hyperlipidemia   . Hypertension   . Internal hemorrhoids   . Prostatitis    Dr Terance Hart    Social History Social History  Substance Use Topics  . Smoking status: Former Smoker    Packs/day: 0.25    Years: 40.00    Types: Cigarettes    Quit date: 07/03/2013  . Smokeless tobacco: Never Used     Comment: smoked [phone], up to 1 ppd   . Alcohol use No     Comment: 2001- states no rehab    Family History Family History  Problem Relation Age of Onset  . Arthritis Mother   . Vascular Disease Mother     Varicose  Veins  . Hypertension Mother   . Hyperlipidemia Mother   . Transient ischemic attack Mother   . Heart disease Mother     After age 17  . Stroke Mother   . Varicose Veins Mother   . Heart attack Mother   . Bone cancer Maternal Grandmother   . Heart attack Paternal Grandmother 48  . Asthma Neg Hx   . Diabetes Neg Hx   . COPD Father     Surgical History Past Surgical History:  Procedure Laterality Date  . COLONOSCOPY  2010   negative; [facility]  . CYSTOSCOPY  10/2014   [facility]; neg  . GREEN LIGHT LASER TURP (TRANSURETHRAL RESECTION OF PROSTATE  09/27/2012   Procedure: GREEN LIGHT LASER TURP (TRANSURETHRAL RESECTION OF PROSTATE;  Surgeon: Fredricka Bonine, MD;  Location: [facility];  Service: Urology;  Laterality: N/A;     . JOINT REPLACEMENT Left Oct. 12, 2012   left total hip replacement by Dr. Mayer Camel  . PROSTATE BIOPSY  2007 , 2009   X2 , Dr Thomasene Mohair  & Dr Terance Hart  . PROSTATE SURGERY     Laser    Allergies  Allergen Reactions  . Lisinopril Cough    Current Outpatient Prescriptions  Medication Sig Dispense Refill  . amLODipine (NORVASC) 5 MG tablet TAKE 1 TABLET BY MOUTH EVERY DAY 90 tablet  1  . aspirin 81 MG tablet Take 81 mg by mouth daily.    Marland Kitchen FIBER PO Take 1 tablet by mouth 3 (three) times daily.    Marland Kitchen loratadine (CLARITIN) 10 MG tablet Take 10 mg by mouth daily.    . pravastatin (PRAVACHOL) 20 MG tablet Take 1 tablet (20 mg total) by mouth daily. 90 tablet 3  . telmisartan (MICARDIS) 40 MG tablet Take 1 tablet (40 mg total) by mouth daily. 90 tablet 3   No current facility-administered medications for this visit.     Review of Systems : See HPI for pertinent positives and negatives.  Physical Examination  Vitals:   05/15/16 0908 05/15/16 0909  BP: (!) 157/92 (!) 163/89  Pulse: (!) 56   Resp: 16   Temp: 97.2 F (36.2 C)   TempSrc: Oral   SpO2: 98%   Weight: 158 lb (71.7 kg)   Height: 5' 7.5" (1.715 m)    Body mass index is 24.38  kg/m.  General: WDWN male in NAD GAIT: normal Eyes: PERRLA Pulmonary: Respirations are non-labored, CTAB.  Cardiac: regular rhythm, no detected murmur.  VASCULAR EXAM Carotid Bruits Right Left   negative negative   Aorta is not palpable. Radial pulses are 2+ palpable and equal.      LE Pulses Right Left   POPLITEAL not palpable  not palpable   POSTERIOR TIBIAL  palpable   palpable    DORSALIS PEDIS  ANTERIOR TIBIAL palpable  palpable     Gastrointestinal: soft, nontender, BS WNL, no r/g,no palpable masses.  Musculoskeletal: No muscle atrophy/wasting. M/S 5/5 throughout, extremities without ischemic changes.  Neurologic: A&O X 3; Appropriate Affect,  Speech is normal CN 2-12 intact, Pain and light touch intact in extremities, Motor exam as listed above.    Non-Invasive Vascular Imaging CAROTID DUPLEX 05/15/2016   Right ICA: <40% stenosis. Left ICA: 60 - 79 % stenosis. No significant change compared to the exam of 11/12/15.   Assessment: KAMARIAN Hunt is a 70 y.o. male who has no history of stroke or TIA.  Today's carotid Duplex suggests less than 40% right internal carotid artery stenosis and 60 - 79% left internal carotid artery stenosis. No change since exam of 05/07/2015 and 11/12/15.  His atherosclerotic risk factors include smoking up until 2014. Fortunately he does not have DM, has a normal BMI,  and exercises regularly.      Plan: Follow-up in 6 months with Carotid Duplex scan.   I discussed in depth with the patient the nature of atherosclerosis, and emphasized the importance of maximal medical management including strict control of blood pressure, blood glucose, and lipid levels, obtaining regular exercise, and continued cessation of smoking.   The patient is aware that without maximal medical management the underlying atherosclerotic disease process will progress, limiting the benefit of any interventions. The patient was given information about stroke prevention and what symptoms should prompt the patient to seek immediate medical care. Thank you for allowing [place] to participate in this patient's care.  Clemon Chambers, RN, MSN, FNP-C [facility] Office: [phone]    05/15/16 9:22 AM

## 2016-05-16 ENCOUNTER — Other Ambulatory Visit: Payer: Self-pay | Admitting: Family

## 2016-05-16 DIAGNOSIS — I6529 Occlusion and stenosis of unspecified carotid artery: Secondary | ICD-10-CM

## 2016-05-17 ENCOUNTER — Other Ambulatory Visit: Payer: Self-pay | Admitting: Internal Medicine

## 2016-05-17 ENCOUNTER — Telehealth: Payer: Self-pay | Admitting: *Deleted

## 2016-05-17 DIAGNOSIS — E785 Hyperlipidemia, unspecified: Secondary | ICD-10-CM

## 2016-05-17 DIAGNOSIS — E871 Hypo-osmolality and hyponatremia: Secondary | ICD-10-CM

## 2016-05-17 DIAGNOSIS — I10 Essential (primary) hypertension: Secondary | ICD-10-CM

## 2016-05-17 MED ORDER — PRAVASTATIN SODIUM 20 MG PO TABS: 20.0000 mg | ORAL_TABLET | Freq: Every day | ORAL | 0 refills | Status: DC

## 2016-05-17 NOTE — Telephone Encounter (Signed)
Labs ordered.

## 2016-05-17 NOTE — Telephone Encounter (Signed)
Left msg on triage stating needing to get a refill on Pravastatin. Have appt schedule for 06/14/16, and wanting to know if he can have labs done prior. Sent a 30 day on cholesterol med pls advise on labs...Johny Chess

## 2016-05-17 NOTE — Telephone Encounter (Signed)
Notified pt MD place lab orders to have labs done prior to appt...Brian Hunt

## 2016-06-12 ENCOUNTER — Other Ambulatory Visit (INDEPENDENT_AMBULATORY_CARE_PROVIDER_SITE_OTHER): Payer: Medicare Other

## 2016-06-12 ENCOUNTER — Encounter: Payer: Self-pay | Admitting: Internal Medicine

## 2016-06-12 ENCOUNTER — Other Ambulatory Visit: Payer: Self-pay | Admitting: Internal Medicine

## 2016-06-12 DIAGNOSIS — E871 Hypo-osmolality and hyponatremia: Secondary | ICD-10-CM | POA: Diagnosis not present

## 2016-06-12 DIAGNOSIS — I10 Essential (primary) hypertension: Secondary | ICD-10-CM

## 2016-06-12 DIAGNOSIS — E785 Hyperlipidemia, unspecified: Secondary | ICD-10-CM

## 2016-06-12 LAB — LIPID PANEL
CHOLESTEROL: 121 mg/dL (ref 0–200)
HDL: 51.2 mg/dL (ref >39.00)
LDL Cholesterol: 63 mg/dL (ref 0–99)
NonHDL: 69.76
Total CHOL/HDL Ratio: 2
Triglycerides: 36 mg/dL (ref 0.0–149.0)
VLDL: 7.2 mg/dL (ref 0.0–40.0)

## 2016-06-12 LAB — CBC WITH DIFFERENTIAL/PLATELET
Basophils Absolute: 0.1 10*3/uL (ref 0.0–0.1)
Basophils Relative: 0.9 % (ref 0.0–3.0)
EOS PCT: 16.6 % — AB (ref 0.0–5.0)
Eosinophils Absolute: 1.1 10*3/uL — ABNORMAL HIGH (ref 0.0–0.7)
HEMATOCRIT: 38.9 % — AB (ref 39.0–52.0)
HEMOGLOBIN: 13.2 g/dL (ref 13.0–17.0)
LYMPHS PCT: 23.1 % (ref 12.0–46.0)
Lymphs Abs: 1.6 10*3/uL (ref 0.7–4.0)
MCHC: 34 g/dL (ref 30.0–36.0)
MCV: 95.8 fl (ref 78.0–100.0)
MONO ABS: 0.8 10*3/uL (ref 0.1–1.0)
Monocytes Relative: 12.2 % — ABNORMAL HIGH (ref 3.0–12.0)
Neutro Abs: 3.2 10*3/uL (ref 1.4–7.7)
Neutrophils Relative %: 47.2 % (ref 43.0–77.0)
Platelets: 294 10*3/uL (ref 150.0–400.0)
RBC: 4.06 Mil/uL — AB (ref 4.22–5.81)
RDW: 13.9 % (ref 11.5–15.5)
WBC: 6.9 10*3/uL (ref 4.0–10.5)

## 2016-06-12 LAB — COMPREHENSIVE METABOLIC PANEL
ALBUMIN: 3.4 g/dL — AB (ref 3.5–5.2)
ALK PHOS: 63 U/L (ref 39–117)
ALT: 11 U/L (ref 0–53)
AST: 14 U/L (ref 0–37)
BUN: 17 mg/dL (ref 6–23)
CO2: 29 mEq/L (ref 19–32)
CREATININE: 0.89 mg/dL (ref 0.40–1.50)
Calcium: 8.4 mg/dL (ref 8.4–10.5)
Chloride: 100 mEq/L (ref 96–112)
GFR: 89.83 mL/min (ref >60.00)
Glucose, Bld: 83 mg/dL (ref 70–99)
POTASSIUM: 4.7 meq/L (ref 3.5–5.1)
SODIUM: 133 meq/L — AB (ref 135–145)
TOTAL PROTEIN: 6.7 g/dL (ref 6.0–8.3)
Total Bilirubin: 0.4 mg/dL (ref 0.2–1.2)

## 2016-06-12 LAB — CORTISOL: CORTISOL PLASMA: 8.9 ug/dL

## 2016-06-12 LAB — TSH: TSH: 2.2 u[IU]/mL (ref 0.35–4.50)

## 2016-06-14 ENCOUNTER — Ambulatory Visit (INDEPENDENT_AMBULATORY_CARE_PROVIDER_SITE_OTHER): Payer: Medicare Other | Admitting: Internal Medicine

## 2016-06-14 ENCOUNTER — Other Ambulatory Visit: Payer: Self-pay | Admitting: *Deleted

## 2016-06-14 ENCOUNTER — Encounter: Payer: Self-pay | Admitting: Internal Medicine

## 2016-06-14 ENCOUNTER — Other Ambulatory Visit: Payer: Self-pay | Admitting: Internal Medicine

## 2016-06-14 ENCOUNTER — Other Ambulatory Visit (INDEPENDENT_AMBULATORY_CARE_PROVIDER_SITE_OTHER): Payer: Medicare Other

## 2016-06-14 VITALS — BP 128/68 | HR 67 | Temp 97.9°F | Resp 16 | Ht 67.5 in | Wt 154.1 lb

## 2016-06-14 DIAGNOSIS — N400 Enlarged prostate without lower urinary tract symptoms: Secondary | ICD-10-CM

## 2016-06-14 DIAGNOSIS — R972 Elevated prostate specific antigen [PSA]: Secondary | ICD-10-CM

## 2016-06-14 DIAGNOSIS — Z0000 Encounter for general adult medical examination without abnormal findings: Secondary | ICD-10-CM

## 2016-06-14 DIAGNOSIS — I10 Essential (primary) hypertension: Secondary | ICD-10-CM | POA: Diagnosis not present

## 2016-06-14 DIAGNOSIS — Z23 Encounter for immunization: Secondary | ICD-10-CM

## 2016-06-14 DIAGNOSIS — I6523 Occlusion and stenosis of bilateral carotid arteries: Secondary | ICD-10-CM | POA: Diagnosis not present

## 2016-06-14 DIAGNOSIS — D539 Nutritional anemia, unspecified: Secondary | ICD-10-CM | POA: Diagnosis not present

## 2016-06-14 DIAGNOSIS — Z1211 Encounter for screening for malignant neoplasm of colon: Secondary | ICD-10-CM

## 2016-06-14 LAB — CBC WITH DIFFERENTIAL/PLATELET
BASOS ABS: 0 10*3/uL (ref 0.0–0.1)
Basophils Relative: 0.4 % (ref 0.0–3.0)
EOS ABS: 0.9 10*3/uL — AB (ref 0.0–0.7)
Eosinophils Relative: 10.2 % — ABNORMAL HIGH (ref 0.0–5.0)
HCT: 39.3 % (ref 39.0–52.0)
Hemoglobin: 13.5 g/dL (ref 13.0–17.0)
LYMPHS ABS: 1.9 10*3/uL (ref 0.7–4.0)
Lymphocytes Relative: 20.9 % (ref 12.0–46.0)
MCHC: 34.4 g/dL (ref 30.0–36.0)
MCV: 94.3 fl (ref 78.0–100.0)
MONO ABS: 0.9 10*3/uL (ref 0.1–1.0)
MONOS PCT: 9.8 % (ref 3.0–12.0)
NEUTROS ABS: 5.4 10*3/uL (ref 1.4–7.7)
NEUTROS PCT: 58.7 % (ref 43.0–77.0)
PLATELETS: 328 10*3/uL (ref 150.0–400.0)
RBC: 4.17 Mil/uL — ABNORMAL LOW (ref 4.22–5.81)
RDW: 14 % (ref 11.5–15.5)
WBC: 9.2 10*3/uL (ref 4.0–10.5)

## 2016-06-14 LAB — FOLATE: Folate: 8.3 ng/mL (ref >5.9)

## 2016-06-14 LAB — IBC PANEL
IRON: 76 ug/dL (ref 42–165)
Saturation Ratios: 22.9 % (ref 20.0–50.0)
Transferrin: 237 mg/dL (ref 212.0–360.0)

## 2016-06-14 LAB — PSA: PSA: 6.08 ng/mL — AB (ref 0.10–4.00)

## 2016-06-14 LAB — VITAMIN B12: Vitamin B-12: 297 pg/mL (ref 211–911)

## 2016-06-14 LAB — FERRITIN: FERRITIN: 32.4 ng/mL (ref 22.0–322.0)

## 2016-06-14 LAB — HEPATITIS C ANTIBODY: HCV Ab: NEGATIVE

## 2016-06-14 MED ORDER — CYANOCOBALAMIN 2000 MCG PO TABS: 2000.0000 ug | ORAL_TABLET | Freq: Every day | ORAL | 3 refills | Status: DC

## 2016-06-14 MED ORDER — ZOSTER VACCINE LIVE 19400 UNT/0.65ML ~~LOC~~ SUSR: 0.6500 mL | Freq: Once | SUBCUTANEOUS | 0 refills | Status: AC

## 2016-06-14 MED ORDER — TELMISARTAN 40 MG PO TABS: 40.0000 mg | ORAL_TABLET | Freq: Every day | ORAL | 3 refills | Status: DC

## 2016-06-14 NOTE — Progress Notes (Signed)
Subjective:  Patient ID: Brian Hunt, male    DOB: [birth year]  Age: 70 y.o. MRN: [id number]  CC: Annual Exam; Anemia; Hypertension; and Hyperlipidemia   HPI ISAH CATBAGAN presents for CPX/AWV.  He tells me his blood pressure has been well controlled on amlodipine and the ARB. He's had no recent episodes of headache/blurred vision/chest pain/shortness of breath/palpitations/edema/fatigue. He is tolerating the statin well with no muscle or joint aches.    Past Medical History:  Diagnosis Date  . Anemia   . Anxiety   . Arthritis   . Basal cell carcinoma    Basal Cell X2 , Dr Radford Pax  . Bursitis of left hip    Dr. Ihor Gully  . Carotid artery occlusion   . COPD (chronic obstructive pulmonary disease) ([place])   . Diverticulosis   . Elevated PSA   . Hyperlipidemia   . Hypertension   . Internal hemorrhoids   . Prostatitis    Dr Terance [facility]   Past Surgical History:  Procedure Laterality Date  . COLONOSCOPY  2010   negative; [facility]  . CYSTOSCOPY  10/2014   [facility]; neg  . GREEN LIGHT LASER TURP (TRANSURETHRAL RESECTION OF PROSTATE  09/27/2012   Procedure: GREEN LIGHT LASER TURP (TRANSURETHRAL RESECTION OF PROSTATE;  Surgeon: Fredricka Bonine, MD;  Location: [facility];  Service: Urology;  Laterality: N/A;     . JOINT REPLACEMENT Left Oct. 12, 2012   left total hip replacement by Dr. Mayer Camel  . PROSTATE BIOPSY  2007 , 2009   X2 , Dr Thomasene Mohair  & Dr Terance [facility]  . PROSTATE SURGERY     Laser    reports that he quit smoking about 2 years ago. His smoking use included Cigarettes. He has a 10.00 pack-year smoking history. He has never used smokeless tobacco. He reports that he does not drink alcohol or use drugs. family history includes Arthritis in his mother; Bone cancer in his maternal grandmother; COPD in his father; Heart attack in his mother; Heart attack (age of onset: 25) in his paternal grandmother; Heart disease in his mother; Hyperlipidemia in his mother; Hypertension  in his mother; Stroke in his mother; Transient ischemic attack in his mother; Varicose Veins in his mother; Vascular Disease in his mother. Allergies  Allergen Reactions  . Lisinopril Cough    Outpatient Medications Prior to Visit  Medication Sig Dispense Refill  . amLODipine (NORVASC) 5 MG tablet TAKE 1 TABLET BY MOUTH EVERY DAY 90 tablet 1  . aspirin 81 MG tablet Take 81 mg by mouth daily.    Marland Kitchen FIBER PO Take 1 tablet by mouth 3 (three) times daily.    Marland Kitchen loratadine (CLARITIN) 10 MG tablet Take 10 mg by mouth daily.    . pravastatin (PRAVACHOL) 20 MG tablet TAKE 1 TABLET BY MOUTH DAILY 30 tablet 5  . telmisartan (MICARDIS) 40 MG tablet Take 1 tablet (40 mg total) by mouth daily. 90 tablet 3  . lisinopril (PRINIVIL,ZESTRIL) 20 MG tablet TK 1 T PO ONCE D     No facility-administered medications prior to visit.     ROS Review of Systems  Constitutional: Negative.  Negative for appetite change, chills, diaphoresis, fatigue and fever.  HENT: Negative.  Negative for sinus pressure and trouble swallowing.   Eyes: Negative.  Negative for visual disturbance.  Respiratory: Negative.  Negative for cough, choking, chest tightness, shortness of breath and stridor.   Cardiovascular: Negative.  Negative for chest pain, palpitations and leg swelling.  Gastrointestinal:  Negative.  Negative for abdominal pain, constipation, diarrhea, nausea and vomiting.  Endocrine: Negative.   Genitourinary: Negative.  Negative for decreased urine volume, difficulty urinating, dysuria, penile pain, penile swelling, scrotal swelling and testicular pain.  Musculoskeletal: Negative.  Negative for arthralgias, back pain, joint swelling and myalgias.  Skin: Negative.  Negative for color change and rash.  Allergic/Immunologic: Negative.   Neurological: Negative.  Negative for dizziness, tremors, weakness, numbness and headaches.  Hematological: Negative.  Negative for adenopathy. Does not bruise/bleed easily.    Psychiatric/Behavioral: Negative.     Objective:  BP 128/68 (BP Location: Left Arm, Patient Position: Sitting, Cuff Size: Normal)   Pulse 67   Temp 97.9 F (36.6 C) (Oral)   Resp 16   Ht 5' 7.5" (1.715 m)   Wt 154 lb 2 oz (69.9 kg)   SpO2 98%   BMI 23.78 kg/m   BP Readings from Last 3 Encounters:  06/14/16 128/68  05/15/16 (!) 163/89  01/13/16 120/78    Wt Readings from Last 3 Encounters:  06/14/16 154 lb 2 oz (69.9 kg)  05/15/16 158 lb (71.7 kg)  01/13/16 158 lb (71.7 kg)    Physical Exam  Constitutional: He is oriented to person, place, and time. He appears well-developed and well-nourished. No distress.  HENT:  Mouth/Throat: Oropharynx is clear and moist. No oropharyngeal exudate.  Eyes: Conjunctivae are normal. Right eye exhibits no discharge. Left eye exhibits no discharge. No scleral icterus.  Neck: Normal range of motion. Neck supple. No JVD present. No tracheal deviation present. No thyromegaly present.  Cardiovascular: Normal rate, regular rhythm, normal heart sounds and intact distal pulses.  Exam reveals no gallop and no friction rub.   No murmur heard. Pulses:      Carotid pulses are 1+ on the right side, and 1+ on the left side.      Radial pulses are 1+ on the right side, and 1+ on the left side.       Femoral pulses are 1+ on the right side, and 1+ on the left side.      Popliteal pulses are 1+ on the right side, and 1+ on the left side.       Dorsalis pedis pulses are 1+ on the right side, and 1+ on the left side.       Posterior tibial pulses are 1+ on the right side, and 1+ on the left side.  EKG ----  Sinus  Bradycardia  -Right bundle branch block.   ABNORMAL - no change from prior EKG  Pulmonary/Chest: Effort normal and breath sounds normal. No stridor. No respiratory distress. He has no wheezes. He has no rales. He exhibits no tenderness.  Abdominal: Soft. Bowel sounds are normal. He exhibits no distension and no mass. There is no tenderness.  There is no rebound and no guarding. Hernia confirmed negative in the right inguinal area and confirmed negative in the left inguinal area.  Genitourinary: Testes normal and penis normal. Rectal exam shows external hemorrhoid. Rectal exam shows no internal hemorrhoid, no fissure, no mass, no tenderness, anal tone normal and guaiac negative stool. Prostate is enlarged (3+ BPH). Prostate is not tender. Right testis shows no mass, no swelling and no tenderness. Right testis is descended. Left testis shows no mass, no swelling and no tenderness. Left testis is descended. Circumcised. No penile erythema or penile tenderness. No discharge found.  Musculoskeletal: Normal range of motion. He exhibits no edema, tenderness or deformity.  Lymphadenopathy:    He has  no cervical adenopathy.       Right: No inguinal adenopathy present.       Left: No inguinal adenopathy present.  Neurological: He is oriented to person, place, and time.  Skin: Skin is warm and dry. No rash noted. He is not diaphoretic. No erythema. No pallor.  Psychiatric: He has a normal mood and affect. His behavior is normal. Judgment and thought content normal.  Vitals reviewed.   Lab Results  Component Value Date   WBC 9.2 06/14/2016   HGB 13.5 06/14/2016   HCT 39.3 06/14/2016   PLT 328.0 06/14/2016   GLUCOSE 83 06/12/2016   CHOL 121 06/12/2016   TRIG 36.0 06/12/2016   HDL 51.20 06/12/2016   LDLCALC 63 06/12/2016   ALT 11 06/12/2016   AST 14 06/12/2016   NA 133 (L) 06/12/2016   K 4.7 06/12/2016   CL 100 06/12/2016   CREATININE 0.89 06/12/2016   BUN 17 06/12/2016   CO2 29 06/12/2016   TSH 2.20 06/12/2016   PSA 6.08 (H) 06/14/2016   INR 1.41 05/28/2011   HGBA1C 5.9 05/11/2015    No results found.  Assessment & Plan:   Aum was seen today for annual exam, anemia, hypertension and hyperlipidemia.  Diagnoses and all orders for this visit:  Need for prophylactic vaccination and inoculation against influenza -     Flu  vaccine HIGH DOSE PF (Fluzone High dose)  Routine general medical examination at a health care facility -     Zoster Vaccine Live, PF, (ZOSTAVAX) 16109 UNT/0.65ML injection; Inject 19,400 Units into the skin once.  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 I've also asked him to have an updated colonoscopy to screen for GI sources of lblood loss. -     CBC with Differential/Platelet; Future -     Hepatitis C antibody; Future -     IBC panel; Future -     Folate; Future -     Ferritin; Future -     Vitamin B12; Future -     cyanocobalamin 2000 MCG tablet; Take 1 tablet (2,000 mcg total) by mouth daily.  Essential hypertension- his EKG is unchanged and is negative for LVH, his blood pressure is well-controlled, electrolytes and renal function are stable. -     telmisartan (MICARDIS) 40 MG tablet; Take 1 tablet (40 mg total) by mouth daily. -     EKG 12-Lead  BPH (benign prostatic hyperplasia)- he has an abnormal prostate exam and an elevated PSA so Avastin to see urology for further evaluation. -     PSA; Future  Carotid artery stenosis, bilateral- will continue risk factor modification with statin therapy, blood pressure control, and aspirin therapy. -     telmisartan (MICARDIS) 40 MG tablet; Take 1 tablet (40 mg total) by mouth daily.  Screening for colon cancer -     Ambulatory referral to Gastroenterology   I have discontinued Mr. Golson's lisinopril. I am also having him start on Zoster Vaccine Live (PF) and cyanocobalamin. Additionally, I am having him maintain his aspirin, FIBER PO, loratadine, amLODipine, pravastatin, and telmisartan.  Meds ordered this encounter  Medications  . telmisartan (MICARDIS) 40 MG tablet    Sig: Take 1 tablet (40 mg total) by mouth daily.    Dispense:  90 tablet    Refill:  3  . Zoster Vaccine Live, PF,  (  ZOSTAVAX) 57846 UNT/0.65ML injection    Sig: Inject 19,400 Units into the skin once.    Dispense:  1 each    Refill:  0  . cyanocobalamin 2000 MCG tablet    Sig: Take 1 tablet (2,000 mcg total) by mouth daily.    Dispense:  90 tablet    Refill:  3   See AVS for instructions about healthy living and anticipatory guidance.  Follow-up: Return in about 6 months (around 12/12/2016).  Scarlette Calico, MD

## 2016-06-14 NOTE — Progress Notes (Signed)
Pre visit review using our clinic review tool, if applicable. No additional management support is needed unless otherwise documented below in the visit note. 

## 2016-06-14 NOTE — Patient Instructions (Signed)

## 2016-06-15 ENCOUNTER — Encounter: Payer: Self-pay | Admitting: Internal Medicine

## 2016-06-17 DIAGNOSIS — R972 Elevated prostate specific antigen [PSA]: Secondary | ICD-10-CM | POA: Insufficient documentation

## 2016-06-17 NOTE — Assessment & Plan Note (Signed)

## 2016-06-26 ENCOUNTER — Telehealth: Payer: Self-pay | Admitting: Emergency Medicine

## 2016-06-26 ENCOUNTER — Encounter: Payer: Self-pay | Admitting: Internal Medicine

## 2016-06-26 NOTE — Telephone Encounter (Signed)
Sent pt a my chart message regarding same.

## 2016-06-26 NOTE — Telephone Encounter (Signed)
Pt called and stated the neurologist wouldn't be able to see him for his prostate exam until the end of December/beginning on Jan. He wants to know what to do to get in earlier? Please advise thanks.

## 2016-06-27 ENCOUNTER — Ambulatory Visit: Payer: Medicare Other | Admitting: Internal Medicine

## 2016-07-07 ENCOUNTER — Ambulatory Visit: Payer: Medicare Other | Admitting: Internal Medicine

## 2016-08-21 ENCOUNTER — Ambulatory Visit (INDEPENDENT_AMBULATORY_CARE_PROVIDER_SITE_OTHER): Payer: Medicare Other | Admitting: Internal Medicine

## 2016-08-21 ENCOUNTER — Encounter: Payer: Self-pay | Admitting: Internal Medicine

## 2016-08-21 VITALS — BP 126/70 | HR 72 | Ht 67.0 in | Wt 159.0 lb

## 2016-08-21 DIAGNOSIS — K648 Other hemorrhoids: Secondary | ICD-10-CM | POA: Diagnosis not present

## 2016-08-21 DIAGNOSIS — Z1211 Encounter for screening for malignant neoplasm of colon: Secondary | ICD-10-CM | POA: Diagnosis not present

## 2016-08-21 DIAGNOSIS — D649 Anemia, unspecified: Secondary | ICD-10-CM | POA: Diagnosis not present

## 2016-08-21 NOTE — Patient Instructions (Signed)
You have a recall colonoscopy scheduled for 07/2019.  We will contact you ahead of time to remind you.

## 2016-08-21 NOTE — Progress Notes (Signed)
HISTORY OF PRESENT ILLNESS:  Brian Hunt is a 70 y.o. male with past medical history as listed below who is sent today by his primary care provider Dr. Ronnald Ramp regarding questionable anemia and screening colonoscopy. I have reviewed the patient's blood work and PCP office visit note. I last saw the patient in September 2014 regarding symptomatic hemorrhoids. These were treated and he reports that they are doing much better. Noted at that time was that he had complete colonoscopy in November 2010 which revealed internal hemorrhoids and diverticulosis. Routine follow-up in 10 years (November 2020) recommended. Patient denies interval family history of colon cancer. He denies rectal bleeding. Review of blood work over the past 4 years does not reveal anemia. Iron studies have been normal. GI review of systems negative except for flatus production. He has seen his urologist for elevated PSA.  REVIEW OF SYSTEMS:  All non-GI ROS negative except for sinus and allergy, cough, hearing problems, muscle cramps, urinary leakage  Past Medical History:  Diagnosis Date  . Anemia   . Anxiety   . Arthritis   . Basal cell carcinoma    Basal Cell X2 , Dr Radford Pax  . Bursitis of left hip    Dr. Ihor Gully  . Carotid artery occlusion   . COPD (chronic obstructive pulmonary disease) ([place])   . Diverticulosis   . Elevated PSA   . Hyperlipidemia   . Hypertension   . Internal hemorrhoids   . Prostatitis    Dr Terance [facility]    Past Surgical History:  Procedure Laterality Date  . COLONOSCOPY  2010   negative; [facility]  . CYSTOSCOPY  10/2014   [facility]; neg  . GREEN LIGHT LASER TURP (TRANSURETHRAL RESECTION OF PROSTATE  09/27/2012   Procedure: GREEN LIGHT LASER TURP (TRANSURETHRAL RESECTION OF PROSTATE;  Surgeon: Fredricka Bonine, MD;  Location: [facility];  Service: Urology;  Laterality: N/A;     . JOINT REPLACEMENT Left Oct. 12, 2012   left total hip replacement by Dr. Mayer Camel  . PROSTATE BIOPSY  2007 ,  2009   X2 , Dr Thomasene Mohair  & Dr Terance [facility]  . PROSTATE SURGERY     Laser    Social History DAMEAN KELDER  reports that he quit smoking about 3 years ago. His smoking use included Cigarettes. He has a 10.00 pack-year smoking history. He has never used smokeless tobacco. He reports that he does not drink alcohol or use drugs.  family history includes Arthritis in his mother; Bone cancer in his maternal grandmother; COPD in his father; Heart attack in his mother; Heart attack (age of onset: 33) in his paternal grandmother; Heart disease in his mother; Hyperlipidemia in his mother; Hypertension in his mother; Stroke in his mother; Transient ischemic attack in his mother; Vascular Disease in his mother.  Allergies  Allergen Reactions  . Lisinopril Cough       PHYSICAL EXAMINATION: Vital signs: BP 126/70   Pulse 72   Ht 5\' 7"  (1.702 m)   Wt 159 lb (72.1 kg)   BMI 24.90 kg/m   Constitutional: generally well-appearing, no acute distress. Read's of tobacco  Psychiatric: alert and oriented x3, cooperative Eyes: extraocular movements intact, anicteric, conjunctiva pink Mouth: oral pharynx moist, no lesions Neck: supple no lymphadenopathy Cardiovascular: heart regular rate and rhythm, no murmur Lungs: clear to auscultation bilaterally Abdomen: soft, nontender, nondistended, no obvious ascites, no peritoneal signs, normal bowel sounds, no organomegaly Rectal: Omitted Extremities: no clubbing cyanosis or lower extremity edema bilaterally Skin:  no lesions on visible extremities Neuro: No focal deficits. Cranial nerves intact  ASSESSMENT:  #1. Last colonoscopy 2010 with diverticulosis and hemorrhoids. Would be due for routine follow-up 2020 #2. No evidence for anemia, particularly iron deficiency #3. Negative GI review of systems   PLAN:  #1. Discussed with patient no strict indication for colonoscopy at this time. I was happy to offer him colonoscopy but he was not interested must  absolutely necessary. I agree. I did tell him that I would check with Dr. Ronnald Ramp to be certain there weren't issues that we were not aware of that would necessitate colonoscopy. I will check with Dr. Ronnald Ramp and we will get back to the patient. He was appreciated.  A copy of this consultation note has been sent to Dr. Scarlette Calico

## 2016-08-23 ENCOUNTER — Telehealth: Payer: Self-pay

## 2016-08-23 NOTE — Telephone Encounter (Signed)
-----   Message from Irene Shipper, MD sent at 08/22/2016  4:21 PM EST ----- Regarding: FW: Possible colonoscopy Vaughan Basta, I saw this patient in the office yesterday. Please let him know that I have corresponded with Dr. Ronnald Ramp. There is no indication for routine colonoscopy at this time. He would be due for routine screening in 2020. She could be evaluated sooner if clinically indicated. Please convert this to a phone note for the permanent record. Thank you. Dr. Henrene Pastor  ----- Message ----- From: Janith Lima, MD Sent: 08/21/2016   4:03 PM To: Irene Shipper, MD Subject: FW: Possible colonoscopy                       He was mildly anemic in Sept  Results for JAVELL, HAYN (MRN [id number]) as of 08/21/2016 16:02  06/12/2016 08:00 WBC: 6.9 RBC: 4.06 (L) Hemoglobin: 13.2 HCT: 38.9 (L) MCV: 95.8 MCHC: 34.0 RDW: 13.9  I think this is what prompted me to have him see GI. Thank you for the information.  Alona Bene   ----- Message ----- From: Irene Shipper, MD Sent: 08/21/2016  10:52 AM To: Janith Lima, MD Subject: Possible colonoscopy                           Hi Tom, I saw this nice patient today. He was referred regarding screening colonoscopy. There was the question of anemia as well, though I saw normal blood counts over the past 4 years. Iron studies have been normal. His GI review of systems is negative. He did undergo colonoscopy elsewhere November 2010. This was negative for neoplasia. No family history. Based on this information the patient would be due for routine colonoscopy November 2010. I offered him an earlier exam at this time if he wished. He preferred that I touch base with you to see if there are other reasons for the colonoscopy at this time that I am not aware of. If not, he would prefer keeping his anniversary date at 2020. If so, we will contact him and arrange an examination. Thanks much John.

## 2016-08-23 NOTE — Telephone Encounter (Signed)
Spoke with pt and he is aware, recall already in epic for 2020.

## 2016-11-10 ENCOUNTER — Encounter: Payer: Self-pay | Admitting: Family

## 2016-11-17 ENCOUNTER — Ambulatory Visit (INDEPENDENT_AMBULATORY_CARE_PROVIDER_SITE_OTHER): Payer: Medicare Other | Admitting: Family

## 2016-11-17 ENCOUNTER — Encounter: Payer: Self-pay | Admitting: Family

## 2016-11-17 ENCOUNTER — Ambulatory Visit (HOSPITAL_COMMUNITY)
Admission: RE | Admit: 2016-11-17 | Discharge: 2016-11-17 | Disposition: A | Discharge status: Home / Self Care | Payer: Medicare Other | Source: Ambulatory Visit | Attending: Family | Admitting: Family

## 2016-11-17 VITALS — BP 127/76 | HR 61 | Temp 97.9°F | Resp 20 | Ht 67.0 in | Wt 159.0 lb

## 2016-11-17 DIAGNOSIS — I6529 Occlusion and stenosis of unspecified carotid artery: Secondary | ICD-10-CM | POA: Insufficient documentation

## 2016-11-17 DIAGNOSIS — I7789 Other specified disorders of arteries and arterioles: Secondary | ICD-10-CM | POA: Insufficient documentation

## 2016-11-17 DIAGNOSIS — I6523 Occlusion and stenosis of bilateral carotid arteries: Secondary | ICD-10-CM | POA: Diagnosis not present

## 2016-11-17 DIAGNOSIS — Z87891 Personal history of nicotine dependence: Secondary | ICD-10-CM | POA: Diagnosis not present

## 2016-11-17 LAB — VAS US CAROTID
LCCADDIAS: 23 cm/s
LCCAPDIAS: 20 cm/s
LEFT ECA DIAS: -18 cm/s
LICAPSYS: -368 cm/s
Left CCA dist sys: 79 cm/s
Left CCA prox sys: 78 cm/s
Left ICA dist dias: -21 cm/s
Left ICA dist sys: -67 cm/s
Left ICA prox dias: -96 cm/s
RCCAPSYS: 106 cm/s
RIGHT CCA MID DIAS: 17 cm/s
RIGHT ECA DIAS: 11 cm/s
Right CCA prox dias: 21 cm/s
Right cca dist sys: -66 cm/s

## 2016-11-17 NOTE — Patient Instructions (Signed)
Stroke Prevention Some medical conditions and behaviors are associated with an increased chance of having a stroke. You may prevent a stroke by making healthy choices and managing medical conditions. How can I reduce my risk of having a stroke?  Stay physically active. Get at least 30 minutes of activity on most or all days.  Do not smoke. It may also be helpful to avoid exposure to secondhand smoke.  Limit alcohol use. Moderate alcohol use is considered to be:  No more than 2 drinks per day for men.  No more than 1 drink per day for nonpregnant women.  Eat healthy foods. This involves:  Eating 5 or more servings of fruits and vegetables a day.  Making dietary changes that address high blood pressure (hypertension), high cholesterol, diabetes, or obesity.  Manage your cholesterol levels.  Making food choices that are high in fiber and low in saturated fat, trans fat, and cholesterol may control cholesterol levels.  Take any prescribed medicines to control cholesterol as directed by your health care provider.  Manage your diabetes.  Controlling your carbohydrate and sugar intake is recommended to manage diabetes.  Take any prescribed medicines to control diabetes as directed by your health care provider.  Control your hypertension.  Making food choices that are low in salt (sodium), saturated fat, trans fat, and cholesterol is recommended to manage hypertension.  Ask your health care provider if you need treatment to lower your blood pressure. Take any prescribed medicines to control hypertension as directed by your health care provider.  If you are 18-39 years of age, have your blood pressure checked every 3-5 years. If you are 40 years of age or older, have your blood pressure checked every year.  Maintain a healthy weight.  Reducing calorie intake and making food choices that are low in sodium, saturated fat, trans fat, and cholesterol are recommended to manage  weight.  Stop drug abuse.  Avoid taking birth control pills.  Talk to your health care provider about the risks of taking birth control pills if you are over 35 years old, smoke, get migraines, or have ever had a blood clot.  Get evaluated for sleep disorders (sleep apnea).  Talk to your health care provider about getting a sleep evaluation if you snore a lot or have excessive sleepiness.  Take medicines only as directed by your health care provider.  For some people, aspirin or blood thinners (anticoagulants) are helpful in reducing the risk of forming abnormal blood clots that can lead to stroke. If you have the irregular heart rhythm of atrial fibrillation, you should be on a blood thinner unless there is a good reason you cannot take them.  Understand all your medicine instructions.  Make sure that other conditions (such as anemia or atherosclerosis) are addressed. Get help right away if:  You have sudden weakness or numbness of the face, arm, or leg, especially on one side of the body.  Your face or eyelid droops to one side.  You have sudden confusion.  You have trouble speaking (aphasia) or understanding.  You have sudden trouble seeing in one or both eyes.  You have sudden trouble walking.  You have dizziness.  You have a loss of balance or coordination.  You have a sudden, severe headache with no known cause.  You have new chest pain or an irregular heartbeat. Any of these symptoms may represent a serious problem that is an emergency. Do not wait to see if the symptoms will go away.   Get medical help at once. Call your local emergency services (911 in [place] Do not drive yourself to the hospital. This information is not intended to replace advice given to you by your health care provider. Make sure you discuss any questions you have with your health care provider. Document Released: 10/12/2004 Document Revised: 02/10/2016 Document Reviewed: 03/07/2013 Elsevier  Interactive Patient Education  2017 Elsevier Inc.      Preventing Cerebrovascular Disease Arteries are blood vessels that carry blood that contains oxygen from the heart to all parts of the body. Cerebrovascular disease affects arteries that supply the brain. Any condition that blocks or disrupts blood flow to the brain can cause cerebrovascular disease. Brain cells that lose blood supply start to die within minutes (stroke). Stroke is the main danger of cerebrovascular disease. Atherosclerosis and high blood pressure are common causes of cerebrovascular disease. Atherosclerosis is narrowing and hardening of an artery that results when fat, cholesterol, calcium, or other substances (plaque) build up inside an artery. Plaque reduces blood flow through the artery. High blood pressure increases the risk of bleeding inside the brain. Making diet and lifestyle changes to prevent atherosclerosis and high blood pressure lowers your risk of cerebrovascular disease. What nutrition changes can be made?  Eat more fruits, vegetables, and whole grains.  Reduce how much saturated fat you eat. To do this, eat less red meat and fewer full-fat dairy products.  Eat healthy proteins instead of red meat. Healthy proteins include:  Fish. Eat fish that contains heart-healthy omega-3 fatty acids, twice a week. Examples include salmon, albacore tuna, mackerel, and herring.  Chicken.  Nuts.  Low-fat or nonfat yogurt.  Avoid processed meats, like bacon and lunchmeat.  Avoid foods that contain:  A lot of sugar, such as sweets and drinks with added sugar.  A lot of salt (sodium). Avoid adding extra salt to your food, as told by your health care provider.  Trans fats, such as margarine and baked goods. Trans fats may be listed as "partially hydrogenated oils" on food labels.  Check food labels to see how much sodium, sugar, and trans fats are in foods.  Use vegetable oils that contain low amounts of  saturated fat, such as olive oil or canola oil. What lifestyle changes can be made?  Drink alcohol in moderation. This means no more than 1 drink a day for nonpregnant women and 2 drinks a day for men. One drink equals 12 oz of beer, 5 oz of wine, or 1 oz of hard liquor.  If you are overweight, ask your health care provider to recommend a weight-loss plan for you. Losing 5-10 lb (2.2-4.5 kg) can reduce your risk of diabetes, atherosclerosis, and high blood pressure.  Exercise for 30?60 minutes on most days, or as much as told by your health care provider.  Do moderate-intensity exercise, such as brisk walking, bicycling, and water aerobics. Ask your health care provider which activities are safe for you.  Do not use any products that contain nicotine or tobacco, such as cigarettes and e-cigarettes. If you need help quitting, ask your health care provider. Why are these changes important? Making these changes lowers your risk of many diseases that can cause cerebrovascular disease and stroke. Stroke is a leading cause of death and disability. Making these changes also improves your overall health and quality of life. What can I do to lower my risk? The following factors make you more likely to develop cerebrovascular disease:  Being overweight.  Smoking.  Being physically   inactive.  Eating a high-fat diet.  Having certain health conditions, such as:  Diabetes.  High blood pressure.  Heart disease.  Atherosclerosis.  High cholesterol.  Sickle cell disease. Talk with your health care provider about your risk for cerebrovascular disease. Work with your health care provider to control diseases that you have that may contribute to cerebrovascular disease. Your health care provider may prescribe medicines to help prevent major causes of cerebrovascular disease. Where to find more information: Learn more about preventing cerebrovascular disease from:  National Heart, Lung, and  Blood Institute: www.nhlbi.nih.gov/health/health-topics/topics/stroke  Centers for Disease Control and Prevention: cdc.gov/stroke/about.htm Summary  Cerebrovascular disease can lead to a stroke.  Atherosclerosis and high blood pressure are major causes of cerebrovascular disease.  Making diet and lifestyle changes can reduce your risk of cerebrovascular disease.  Work with your health care provider to get your risk factors under control to reduce your risk of cerebrovascular disease. This information is not intended to replace advice given to you by your health care provider. Make sure you discuss any questions you have with your health care provider. Document Released: 09/19/2015 Document Revised: 03/24/2016 Document Reviewed: 09/19/2015 Elsevier Interactive Patient Education  2017 Elsevier Inc.  

## 2016-11-17 NOTE — Progress Notes (Signed)
Chief Complaint: Follow up Extracranial Carotid Artery Stenosis   History of Present Illness  Brian Hunt is a 71 y.o. male patient of Dr. Bridgett Larsson followed for known extracranial carotid artery stenosis.  He returns today for follow up.  Patient has not had previous carotid artery intervention.  The patient denies any history of TIA or stroke symptoms.Specifically he denies any history of amaurosis fugax or monocular blindness, unilateral facial drooping, hemiparesis, or receptive or expressive aphasia.   Pt reports New Medical or Surgical History: none. He had a hypertensive episode October, 2014, his PCP adjusted his hypertension medication at that time, states he has had no further blood pressure issues, that is why he stopped smoking he states. He states his blood pressure at home runs about 125/75. He denies chest pain or dyspnea. He works out in a gym 3x/week. He denies claudication symptoms with walking.  Pt Diabetic: No Pt smoker: former smoker, quit in October, 2014  Pt meds include: Statin : Yes ASA: Yes Other anticoagulants/antiplatelets: no   Past Medical History:  Diagnosis Date  . Anemia   . Anxiety   . Arthritis   . Basal cell carcinoma    Basal Cell X2 , Dr Radford Pax  . Bursitis of left hip    Dr. Ihor Gully  . Carotid artery occlusion   . COPD (chronic obstructive pulmonary disease) ([place])   . Diverticulosis   . Elevated PSA   . Hyperlipidemia   . Hypertension   . Internal hemorrhoids   . Prostatitis    Dr Terance Hart    Social History Social History  Substance Use Topics  . Smoking status: Former Smoker    Packs/day: 0.25    Years: 40.00    Types: Cigarettes    Quit date: 07/03/2013  . Smokeless tobacco: Never Used     Comment: smoked [phone], up to 1 ppd   . Alcohol use No     Comment: 2001- states no rehab    Family History Family History  Problem Relation Age of Onset  . Arthritis Mother   . Vascular Disease Mother     Varicose  Veins  . Hypertension Mother   . Hyperlipidemia Mother   . Transient ischemic attack Mother   . Heart disease Mother     After age 59  . Stroke Mother   . Heart attack Mother   . COPD Father   . Bone cancer Maternal Grandmother   . Heart attack Paternal Grandmother 34  . Asthma Neg Hx   . Diabetes Neg Hx   . Colon cancer Neg Hx   . Pancreatic cancer Neg Hx   . Esophageal cancer Neg Hx   . Stomach cancer Neg Hx   . Liver disease Neg Hx     Surgical History Past Surgical History:  Procedure Laterality Date  . COLONOSCOPY  2010   negative; [facility]  . CYSTOSCOPY  10/2014   [facility]; neg  . GREEN LIGHT LASER TURP (TRANSURETHRAL RESECTION OF PROSTATE  09/27/2012   Procedure: GREEN LIGHT LASER TURP (TRANSURETHRAL RESECTION OF PROSTATE;  Surgeon: Fredricka Bonine, MD;  Location: [facility];  Service: Urology;  Laterality: N/A;     . JOINT REPLACEMENT Left Oct. 12, 2012   left total hip replacement by Dr. Mayer Camel  . PROSTATE BIOPSY  2007 , 2009   X2 , Dr Thomasene Mohair  & Dr Terance Hart  . PROSTATE SURGERY     Laser    Allergies  Allergen Reactions  .  Lisinopril Cough    Current Outpatient Prescriptions  Medication Sig Dispense Refill  . amLODipine (NORVASC) 5 MG tablet TAKE 1 TABLET BY MOUTH EVERY DAY 90 tablet 1  . aspirin 81 MG tablet Take 81 mg by mouth daily.    . cyanocobalamin 2000 MCG tablet Take 1 tablet (2,000 mcg total) by mouth daily. 90 tablet 3  . FIBER PO Take 1 tablet by mouth 3 (three) times daily.    Marland Kitchen loratadine (CLARITIN) 10 MG tablet Take 10 mg by mouth daily.    . pravastatin (PRAVACHOL) 20 MG tablet TAKE 1 TABLET BY MOUTH DAILY 30 tablet 5  . telmisartan (MICARDIS) 40 MG tablet Take 1 tablet (40 mg total) by mouth daily. 90 tablet 3   No current facility-administered medications for this visit.     Review of Systems : See HPI for pertinent positives and negatives.  Physical Examination  Vitals:   11/17/16 1003 11/17/16 1004  BP: 125/78  127/76  Pulse: 61   Resp: 20   Temp: 97.9 F (36.6 C)   TempSrc: Oral   SpO2: 100%   Weight: 159 lb (72.1 kg)   Height: 5\' 7"  (1.702 m)    Body mass index is 24.9 kg/m.  General: WDWN male in NAD GAIT:normal Eyes: PERRLA Pulmonary: Respirations are non-labored, CTAB.  Cardiac: regular rhythm, no detected murmur.  VASCULAR EXAM Carotid Bruits Right Left   negative negative   Aorta is not palpable. Radial pulses are 2+ palpable and equal.      LE Pulses Right Left   POPLITEAL not palpable  not palpable   POSTERIOR TIBIAL  palpable   palpable    DORSALIS PEDIS  ANTERIOR TIBIAL palpable  palpable     Gastrointestinal: soft, nontender, BS WNL, no r/g,no palpable masses.  Musculoskeletal: No muscle atrophy/wasting. M/S 5/5 throughout, extremities without ischemic changes.  Neurologic: A&O X 3; Appropriate Affect,  Speech is normal CN 2-12 intact, Pain and light touch intact in extremities, Motor exam as listed above.      Assessment: Brian Hunt is a 71 y.o. male  who has no history of stroke or TIA.  His atherosclerotic risk factors include smoking up until 2014. Fortunately he does not have DM, has a normal BMI, and exercises regularly.     DATA Today's carotid Duplex suggests less than 40% right internal carotid artery stenosisand 60 -79% left internal carotid artery stenosis. No significant stenosis of the bilateral ECA or CCA. Left carotid bifurcation is at the mid hyoid. Normal ICA distal to stenosis.  Bilateral vertebral artery flow is antegrade.  Bilateral subclavian artery waveforms are normal.  Mild increase in left ICA stenosis compared to previous exams, but category remains 60-79%.    Plan: Follow-up in 6 months with Carotid  Duplex scan.   I discussed in depth with the patient the nature of atherosclerosis, and emphasized the importance of maximal medical management including strict control of blood pressure, blood glucose, and lipid levels, obtaining regular exercise, and continued cessation of smoking.  The patient is aware that without maximal medical management the underlying atherosclerotic disease process will progress, limiting the benefit of any interventions. The patient was given information about stroke prevention and what symptoms should prompt the patient to seek immediate medical care. Thank you for allowing [place] to participate in this patient's care.  Clemon Chambers, RN, MSN, FNP-C [facility] Office: [phone]  Clinic Physician: Bridgett Larsson  11/17/16 11:33 AM

## 2016-11-24 NOTE — Addendum Note (Signed)
Addended by: Taleshia Luff A on: 11/24/2016 09:55 AM   Modules accepted: Orders  

## 2016-12-07 ENCOUNTER — Other Ambulatory Visit: Payer: Self-pay | Admitting: Internal Medicine

## 2016-12-18 LAB — PSA: PSA: 4.7

## 2017-01-28 ENCOUNTER — Other Ambulatory Visit: Payer: Self-pay | Admitting: Internal Medicine

## 2017-01-28 DIAGNOSIS — I6523 Occlusion and stenosis of bilateral carotid arteries: Secondary | ICD-10-CM

## 2017-01-28 DIAGNOSIS — I10 Essential (primary) hypertension: Secondary | ICD-10-CM

## 2017-01-29 NOTE — Telephone Encounter (Signed)
Made appt with Ronnald Ramp..  Pt would like refill sent to [facility] on [address]

## 2017-01-29 NOTE — Telephone Encounter (Signed)
No answer. LVM for patient to inform. On home&cell phone. Thank you.

## 2017-02-08 ENCOUNTER — Ambulatory Visit (INDEPENDENT_AMBULATORY_CARE_PROVIDER_SITE_OTHER): Payer: Medicare Other | Admitting: Internal Medicine

## 2017-02-08 ENCOUNTER — Encounter: Payer: Self-pay | Admitting: Internal Medicine

## 2017-02-08 ENCOUNTER — Other Ambulatory Visit (INDEPENDENT_AMBULATORY_CARE_PROVIDER_SITE_OTHER): Payer: Medicare Other

## 2017-02-08 VITALS — BP 120/80 | HR 58 | Temp 98.1°F | Ht 67.0 in | Wt 157.0 lb

## 2017-02-08 DIAGNOSIS — I10 Essential (primary) hypertension: Secondary | ICD-10-CM

## 2017-02-08 DIAGNOSIS — E871 Hypo-osmolality and hyponatremia: Secondary | ICD-10-CM

## 2017-02-08 DIAGNOSIS — E538 Deficiency of other specified B group vitamins: Secondary | ICD-10-CM | POA: Diagnosis not present

## 2017-02-08 LAB — CBC WITH DIFFERENTIAL/PLATELET
BASOS ABS: 0.1 10*3/uL (ref 0.0–0.1)
Basophils Relative: 1.3 % (ref 0.0–3.0)
EOS ABS: 0.5 10*3/uL (ref 0.0–0.7)
Eosinophils Relative: 7.5 % — ABNORMAL HIGH (ref 0.0–5.0)
HEMATOCRIT: 40.5 % (ref 39.0–52.0)
Hemoglobin: 13.6 g/dL (ref 13.0–17.0)
Lymphocytes Relative: 19.8 % (ref 12.0–46.0)
Lymphs Abs: 1.2 10*3/uL (ref 0.7–4.0)
MCHC: 33.7 g/dL (ref 30.0–36.0)
MCV: 96.1 fl (ref 78.0–100.0)
Monocytes Absolute: 0.8 10*3/uL (ref 0.1–1.0)
Monocytes Relative: 12.6 % — ABNORMAL HIGH (ref 3.0–12.0)
NEUTROS PCT: 58.8 % (ref 43.0–77.0)
Neutro Abs: 3.6 10*3/uL (ref 1.4–7.7)
PLATELETS: 325 10*3/uL (ref 150.0–400.0)
RBC: 4.21 Mil/uL — ABNORMAL LOW (ref 4.22–5.81)
RDW: 13.7 % (ref 11.5–15.5)
WBC: 6.1 10*3/uL (ref 4.0–10.5)

## 2017-02-08 LAB — BASIC METABOLIC PANEL
BUN: 12 mg/dL (ref 6–23)
CHLORIDE: 97 meq/L (ref 96–112)
CO2: 27 mEq/L (ref 19–32)
Calcium: 9.2 mg/dL (ref 8.4–10.5)
Creatinine, Ser: 0.82 mg/dL (ref 0.40–1.50)
GFR: 98.54 mL/min (ref >60.00)
Glucose, Bld: 94 mg/dL (ref 70–99)
POTASSIUM: 4.3 meq/L (ref 3.5–5.1)
Sodium: 129 mEq/L — ABNORMAL LOW (ref 135–145)

## 2017-02-08 LAB — FOLATE: FOLATE: 11.4 ng/mL (ref >5.9)

## 2017-02-08 LAB — VITAMIN B12

## 2017-02-08 NOTE — Progress Notes (Signed)
Subjective:  Patient ID: Brian Hunt, male    DOB: [birth year]  Age: 71 y.o. MRN: [id number]  CC: Hypertension   HPI Brian Hunt presents for a BP check. He feels well today and offers no complaints. He said no recent episodes of dizziness, headache, lightheadedness, polyuria, or polydipsia. He is being followed for hyponatremia which has been seen in his labs for several years now.  Outpatient Medications Prior to Visit  Medication Sig Dispense Refill  . amLODipine (NORVASC) 5 MG tablet TAKE 1 TABLET BY MOUTH EVERY DAY 90 tablet 1  . aspirin 81 MG tablet Take 81 mg by mouth daily.    . cyanocobalamin 2000 MCG tablet Take 1 tablet (2,000 mcg total) by mouth daily. 90 tablet 3  . FIBER PO Take 1 tablet by mouth 3 (three) times daily.    Marland Kitchen loratadine (CLARITIN) 10 MG tablet Take 10 mg by mouth daily.    . pravastatin (PRAVACHOL) 20 MG tablet TAKE 1 TABLET BY MOUTH DAILY 90 tablet 2  . telmisartan (MICARDIS) 40 MG tablet Take 1 tablet (40 mg total) by mouth daily. 90 tablet 0  . telmisartan (MICARDIS) 40 MG tablet Take 1 tablet (40 mg total) by mouth daily. 90 tablet 3   No facility-administered medications prior to visit.     ROS Review of Systems  Constitutional: Negative.  Negative for appetite change, diaphoresis, fatigue and unexpected weight change.  HENT: Negative.  Negative for trouble swallowing.   Eyes: Negative for visual disturbance.  Respiratory: Negative for apnea, cough, chest tightness, shortness of breath and wheezing.   Cardiovascular: Negative.  Negative for chest pain, palpitations and leg swelling.  Gastrointestinal: Negative for abdominal pain, constipation, diarrhea, nausea and vomiting.  Endocrine: Negative for polydipsia, polyphagia and polyuria.  Genitourinary: Negative.  Negative for decreased urine volume, difficulty urinating, dysuria, flank pain, frequency, hematuria and urgency.  Musculoskeletal: Negative.  Negative for back pain, myalgias and neck  pain.  Skin: Negative.   Neurological: Negative.  Negative for dizziness, weakness and numbness.  Hematological: Negative for adenopathy. Does not bruise/bleed easily.  Psychiatric/Behavioral: Negative.     Objective:  BP 120/80 (BP Location: Left Arm, Patient Position: Sitting, Cuff Size: Normal)   Pulse (!) 58   Temp 98.1 F (36.7 C) (Oral)   Ht 5\' 7"  (1.702 m)   Wt 157 lb (71.2 kg)   SpO2 98%   BMI 24.59 kg/m   BP Readings from Last 3 Encounters:  02/08/17 120/80  11/17/16 127/76  08/21/16 126/70    Wt Readings from Last 3 Encounters:  02/08/17 157 lb (71.2 kg)  11/17/16 159 lb (72.1 kg)  08/21/16 159 lb (72.1 kg)    Physical Exam  Constitutional: He is oriented to person, place, and time. No distress.  HENT:  Mouth/Throat: Oropharynx is clear and moist. No oropharyngeal exudate.  Eyes: Conjunctivae are normal. Right eye exhibits no discharge. Left eye exhibits no discharge. No scleral icterus.  Neck: Normal range of motion. Neck supple. No JVD present. No tracheal deviation present. No thyromegaly present.  Cardiovascular: Normal rate, regular rhythm, normal heart sounds and intact distal pulses.  Exam reveals no gallop and no friction rub.   No murmur heard. Pulmonary/Chest: Effort normal and breath sounds normal. No stridor. No respiratory distress. He has no wheezes. He has no rales. He exhibits no tenderness.  Abdominal: Soft. Bowel sounds are normal. He exhibits no distension and no mass. There is no tenderness. There is no rebound and no  guarding.  Musculoskeletal: Normal range of motion. He exhibits no edema or tenderness.  Lymphadenopathy:    He has no cervical adenopathy.  Neurological: He is oriented to person, place, and time. He has normal reflexes.  Skin: Skin is dry. No rash noted. He is not diaphoretic. No erythema. No pallor.  Vitals reviewed.   Lab Results  Component Value Date   WBC 6.1 02/08/2017   HGB 13.6 02/08/2017   HCT 40.5 02/08/2017    PLT 325.0 02/08/2017   GLUCOSE 94 02/08/2017   CHOL 121 06/12/2016   TRIG 36.0 06/12/2016   HDL 51.20 06/12/2016   LDLCALC 63 06/12/2016   ALT 11 06/12/2016   AST 14 06/12/2016   NA 129 (L) 02/08/2017   K 4.3 02/08/2017   CL 97 02/08/2017   CREATININE 0.82 02/08/2017   BUN 12 02/08/2017   CO2 27 02/08/2017   TSH 2.20 06/12/2016   PSA 4.7 12/18/2016   INR 1.41 05/28/2011   HGBA1C 5.9 05/11/2015    No results found.  Assessment & Plan:   Brian Hunt was seen today for hypertension.  Diagnoses and all orders for this visit:  Essential hypertension- his blood pressure is adequately well controlled -     Basic metabolic panel; Future  Hyponatremia- he has chronic, mild hyponatremia but is asymptomatic and does not receive any diuretics. This is a benign finding for him with no specific treatment or other considerations. Will continue to monitor this. -     Basic metabolic panel; Future  B12 deficiency- his B12 level is above 1500 on oral supplementation so told him that he can decrease his dose to every 3 days -     CBC with Differential/Platelet; Future -     Vitamin B12; Future -     Folate; Future   I am having Brian Hunt maintain his aspirin, FIBER PO, loratadine, amLODipine, cyanocobalamin, pravastatin, and telmisartan.  No orders of the defined types were placed in this encounter.    Follow-up: Return in about 6 months (around 08/11/2017).  Scarlette Calico, MD

## 2017-02-08 NOTE — Patient Instructions (Signed)

## 2017-03-07 ENCOUNTER — Other Ambulatory Visit: Payer: Self-pay | Admitting: Internal Medicine

## 2017-03-07 DIAGNOSIS — I10 Essential (primary) hypertension: Secondary | ICD-10-CM

## 2017-05-25 ENCOUNTER — Encounter (HOSPITAL_COMMUNITY): Payer: Medicare Other

## 2017-05-25 ENCOUNTER — Ambulatory Visit: Payer: Medicare Other | Admitting: Family

## 2017-06-02 ENCOUNTER — Other Ambulatory Visit: Payer: Self-pay | Admitting: Internal Medicine

## 2017-06-02 DIAGNOSIS — I10 Essential (primary) hypertension: Secondary | ICD-10-CM

## 2017-06-14 ENCOUNTER — Ambulatory Visit (HOSPITAL_COMMUNITY)
Admission: RE | Admit: 2017-06-14 | Discharge: 2017-06-14 | Disposition: A | Discharge status: Home / Self Care | Payer: Medicare Other | Source: Ambulatory Visit | Attending: Family | Admitting: Family

## 2017-06-14 ENCOUNTER — Encounter: Payer: Self-pay | Admitting: Family

## 2017-06-14 ENCOUNTER — Ambulatory Visit (INDEPENDENT_AMBULATORY_CARE_PROVIDER_SITE_OTHER): Payer: Medicare Other | Admitting: Family

## 2017-06-14 VITALS — BP 161/94 | HR 63 | Temp 97.4°F | Resp 18 | Ht 67.0 in | Wt 160.0 lb

## 2017-06-14 DIAGNOSIS — Z87891 Personal history of nicotine dependence: Secondary | ICD-10-CM | POA: Diagnosis not present

## 2017-06-14 DIAGNOSIS — I6523 Occlusion and stenosis of bilateral carotid arteries: Secondary | ICD-10-CM | POA: Insufficient documentation

## 2017-06-14 LAB — VAS US CAROTID
LCCAPDIAS: 16 cm/s
LEFT ECA DIAS: -18 cm/s
LICADDIAS: -19 cm/s
LICAPDIAS: -20 cm/s
Left CCA dist dias: -16 cm/s
Left CCA dist sys: -53 cm/s
Left CCA prox sys: 76 cm/s
Left ICA dist sys: -53 cm/s
Left ICA prox sys: -90 cm/s
RCCAPDIAS: 12 cm/s
RIGHT CCA MID DIAS: 12 cm/s
RIGHT ECA DIAS: -13 cm/s
Right CCA prox sys: 96 cm/s
Right cca dist sys: -68 cm/s

## 2017-06-14 NOTE — Patient Instructions (Signed)
Stroke Prevention Some medical conditions and behaviors are associated with an increased chance of having a stroke. You may prevent a stroke by making healthy choices and managing medical conditions. How can I reduce my risk of having a stroke?  Stay physically active. Get at least 30 minutes of activity on most or all days.  Do not smoke. It may also be helpful to avoid exposure to secondhand smoke.  Limit alcohol use. Moderate alcohol use is considered to be: ? No more than 2 drinks per day for men. ? No more than 1 drink per day for nonpregnant women.  Eat healthy foods. This involves: ? Eating 5 or more servings of fruits and vegetables a day. ? Making dietary changes that address high blood pressure (hypertension), high cholesterol, diabetes, or obesity.  Manage your cholesterol levels. ? Making food choices that are high in fiber and low in saturated fat, trans fat, and cholesterol may control cholesterol levels. ? Take any prescribed medicines to control cholesterol as directed by your health care provider.  Manage your diabetes. ? Controlling your carbohydrate and sugar intake is recommended to manage diabetes. ? Take any prescribed medicines to control diabetes as directed by your health care provider.  Control your hypertension. ? Making food choices that are low in salt (sodium), saturated fat, trans fat, and cholesterol is recommended to manage hypertension. ? Ask your health care provider if you need treatment to lower your blood pressure. Take any prescribed medicines to control hypertension as directed by your health care provider. ? If you are 18-39 years of age, have your blood pressure checked every 3-5 years. If you are 40 years of age or older, have your blood pressure checked every year.  Maintain a healthy weight. ? Reducing calorie intake and making food choices that are low in sodium, saturated fat, trans fat, and cholesterol are recommended to manage  weight.  Stop drug abuse.  Avoid taking birth control pills. ? Talk to your health care provider about the risks of taking birth control pills if you are over 35 years old, smoke, get migraines, or have ever had a blood clot.  Get evaluated for sleep disorders (sleep apnea). ? Talk to your health care provider about getting a sleep evaluation if you snore a lot or have excessive sleepiness.  Take medicines only as directed by your health care provider. ? For some people, aspirin or blood thinners (anticoagulants) are helpful in reducing the risk of forming abnormal blood clots that can lead to stroke. If you have the irregular heart rhythm of atrial fibrillation, you should be on a blood thinner unless there is a good reason you cannot take them. ? Understand all your medicine instructions.  Make sure that other conditions (such as anemia or atherosclerosis) are addressed. Get help right away if:  You have sudden weakness or numbness of the face, arm, or leg, especially on one side of the body.  Your face or eyelid droops to one side.  You have sudden confusion.  You have trouble speaking (aphasia) or understanding.  You have sudden trouble seeing in one or both eyes.  You have sudden trouble walking.  You have dizziness.  You have a loss of balance or coordination.  You have a sudden, severe headache with no known cause.  You have new chest pain or an irregular heartbeat. Any of these symptoms may represent a serious problem that is an emergency. Do not wait to see if the symptoms will go away.   Get medical help at once. Call your local emergency services (911 in [place] Do not drive yourself to the hospital. This information is not intended to replace advice given to you by your health care provider. Make sure you discuss any questions you have with your health care provider. Document Released: 10/12/2004 Document Revised: 02/10/2016 Document Reviewed: 03/07/2013 Elsevier  Interactive Patient Education  2017 Elsevier Inc.     Preventing Cerebrovascular Disease Arteries are blood vessels that carry blood that contains oxygen from the heart to all parts of the body. Cerebrovascular disease affects arteries that supply the brain. Any condition that blocks or disrupts blood flow to the brain can cause cerebrovascular disease. Brain cells that lose blood supply start to die within minutes (stroke). Stroke is the main danger of cerebrovascular disease. Atherosclerosis and high blood pressure are common causes of cerebrovascular disease. Atherosclerosis is narrowing and hardening of an artery that results when fat, cholesterol, calcium, or other substances (plaque) build up inside an artery. Plaque reduces blood flow through the artery. High blood pressure increases the risk of bleeding inside the brain. Making diet and lifestyle changes to prevent atherosclerosis and high blood pressure lowers your risk of cerebrovascular disease. What nutrition changes can be made?  Eat more fruits, vegetables, and whole grains.  Reduce how much saturated fat you eat. To do this, eat less red meat and fewer full-fat dairy products.  Eat healthy proteins instead of red meat. Healthy proteins include: ? Fish. Eat fish that contains heart-healthy omega-3 fatty acids, twice a week. Examples include salmon, albacore tuna, mackerel, and herring. ? Chicken. ? Nuts. ? Low-fat or nonfat yogurt.  Avoid processed meats, like bacon and lunchmeat.  Avoid foods that contain: ? A lot of sugar, such as sweets and drinks with added sugar. ? A lot of salt (sodium). Avoid adding extra salt to your food, as told by your health care provider. ? Trans fats, such as margarine and baked goods. Trans fats may be listed as "partially hydrogenated oils" on food labels.  Check food labels to see how much sodium, sugar, and trans fats are in foods.  Use vegetable oils that contain low amounts of  saturated fat, such as olive oil or canola oil. What lifestyle changes can be made?  Drink alcohol in moderation. This means no more than 1 drink a day for nonpregnant women and 2 drinks a day for men. One drink equals 12 oz of beer, 5 oz of wine, or 1 oz of hard liquor.  If you are overweight, ask your health care provider to recommend a weight-loss plan for you. Losing 5-10 lb (2.2-4.5 kg) can reduce your risk of diabetes, atherosclerosis, and high blood pressure.  Exercise for 30?60 minutes on most days, or as much as told by your health care provider. ? Do moderate-intensity exercise, such as brisk walking, bicycling, and water aerobics. Ask your health care provider which activities are safe for you.  Do not use any products that contain nicotine or tobacco, such as cigarettes and e-cigarettes. If you need help quitting, ask your health care provider. Why are these changes important? Making these changes lowers your risk of many diseases that can cause cerebrovascular disease and stroke. Stroke is a leading cause of death and disability. Making these changes also improves your overall health and quality of life. What can I do to lower my risk? The following factors make you more likely to develop cerebrovascular disease:  Being overweight.  Smoking.  Being physically inactive.    Eating a high-fat diet.  Having certain health conditions, such as: ? Diabetes. ? High blood pressure. ? Heart disease. ? Atherosclerosis. ? High cholesterol. ? Sickle cell disease.  Talk with your health care provider about your risk for cerebrovascular disease. Work with your health care provider to control diseases that you have that may contribute to cerebrovascular disease. Your health care provider may prescribe medicines to help prevent major causes of cerebrovascular disease. Where to find more information: Learn more about preventing cerebrovascular disease from:  National Heart, Lung, and  Blood Institute: www.nhlbi.nih.gov/health/health-topics/topics/stroke  Centers for Disease Control and Prevention: cdc.gov/stroke/about.htm  Summary  Cerebrovascular disease can lead to a stroke.  Atherosclerosis and high blood pressure are major causes of cerebrovascular disease.  Making diet and lifestyle changes can reduce your risk of cerebrovascular disease.  Work with your health care provider to get your risk factors under control to reduce your risk of cerebrovascular disease. This information is not intended to replace advice given to you by your health care provider. Make sure you discuss any questions you have with your health care provider. Document Released: 09/19/2015 Document Revised: 03/24/2016 Document Reviewed: 09/19/2015 Elsevier Interactive Patient Education  2018 Elsevier Inc.  

## 2017-06-14 NOTE — Progress Notes (Signed)
Chief Complaint: Follow up Extracranial Carotid Artery Stenosis   History of Present Illness  Brian Hunt is a 71 y.o. male patient of Dr. Bridgett Larsson followed for known extracranial carotid artery stenosis.  He returns today for follow up.  Patient has not had previous carotid artery intervention.  The patient denies any history of TIA or stroke symptoms.Specifically he denies any history of amaurosis fugax or monocular blindness, unilateral facial drooping, hemiparesis, or receptive or expressive aphasia.   He had a hypertensive episode October, 2014, his PCP adjusted his hypertension medication at that time, states he has had no further blood pressure issues, that is why he stopped smoking he states. He states his blood pressure at home runs about 125/75. He denies chest pain or dyspnea. He works out in a gym 3x/week. He denies claudication symptoms with walking.  He noted some hematuria this morning, he passed a blood clot in his urine. He states he has had hematuria in the past and work up was negative. His prostate has been biopsied, pt states he has an appointment with Dr. Patsy Baltimore in a couple of weeks.   Pt Diabetic: No Pt smoker: former smoker, quit in October, 2014  Pt meds include: Statin : Yes, he attributes feeling tired and some muscle aches to statin  ASA: Yes Other anticoagulants/antiplatelets: no   Past Medical History:  Diagnosis Date  . Anemia   . Anxiety   . Arthritis   . Basal cell carcinoma    Basal Cell X2 , Dr Radford Pax  . Bursitis of left hip    Dr. Ihor Gully  . Carotid artery occlusion   . COPD (chronic obstructive pulmonary disease) ([place])   . Diverticulosis   . Elevated PSA   . Hyperlipidemia   . Hypertension   . Internal hemorrhoids   . Prostatitis    Dr Terance Hart    Social History Social History  Substance Use Topics  . Smoking status: Former Smoker    Packs/day: 0.25    Years: 40.00    Types: Cigarettes    Quit date: 07/03/2013  .  Smokeless tobacco: Never Used     Comment: smoked [phone], up to 1 ppd   . Alcohol use No     Comment: 2001- states no rehab    Family History Family History  Problem Relation Age of Onset  . Arthritis Mother   . Vascular Disease Mother        Varicose Veins  . Hypertension Mother   . Hyperlipidemia Mother   . Transient ischemic attack Mother   . Heart disease Mother        After age 51  . Stroke Mother   . Heart attack Mother   . COPD Father   . Bone cancer Maternal Grandmother   . Heart attack Paternal Grandmother 24  . Asthma Neg Hx   . Diabetes Neg Hx   . Colon cancer Neg Hx   . Pancreatic cancer Neg Hx   . Esophageal cancer Neg Hx   . Stomach cancer Neg Hx   . Liver disease Neg Hx     Surgical History Past Surgical History:  Procedure Laterality Date  . COLONOSCOPY  2010   negative; [facility]  . CYSTOSCOPY  10/2014   [facility]; neg  . GREEN LIGHT LASER TURP (TRANSURETHRAL RESECTION OF PROSTATE  09/27/2012   Procedure: GREEN LIGHT LASER TURP (TRANSURETHRAL RESECTION OF PROSTATE;  Surgeon: Fredricka Bonine, MD;  Location: [facility];  Service: Urology;  Laterality: N/A;     . JOINT REPLACEMENT Left Oct. 12, 2012   left total hip replacement by Dr. Mayer Camel  . PROSTATE BIOPSY  2007 , 2009   X2 , Dr Thomasene Mohair  & Dr Terance Hart  . PROSTATE SURGERY     Laser    Allergies  Allergen Reactions  . Lisinopril Cough    Current Outpatient Prescriptions  Medication Sig Dispense Refill  . amLODipine (NORVASC) 5 MG tablet TAKE 1 TABLET BY MOUTH EVERY DAY 90 tablet 1  . amLODipine (NORVASC) 5 MG tablet TAKE 1 TABLET BY MOUTH EVERY DAY 90 tablet 0  . aspirin 81 MG tablet Take 81 mg by mouth daily.    . cyanocobalamin 2000 MCG tablet Take 1 tablet (2,000 mcg total) by mouth daily. (Patient taking differently: Take 2,000 mcg by mouth 2 days. ) 90 tablet 3  . FIBER PO Take 1 tablet by mouth 3 (three) times daily.    Marland Kitchen loratadine (CLARITIN) 10 MG tablet Take 10 mg by  mouth daily.    . pravastatin (PRAVACHOL) 20 MG tablet TAKE 1 TABLET BY MOUTH DAILY 90 tablet 2  . telmisartan (MICARDIS) 40 MG tablet Take 1 tablet (40 mg total) by mouth daily. 90 tablet 0   No current facility-administered medications for this visit.     Review of Systems : See HPI for pertinent positives and negatives.  Physical Examination  Vitals:   06/14/17 0906 06/14/17 0909  BP: (!) 155/90 (!) 161/94  Pulse: 63   Resp: 18   Temp: (!) 97.4 F (36.3 C)   TempSrc: Oral   SpO2: 98%   Weight: 160 lb (72.6 kg)   Height: 5\' 7"  (1.702 m)    Body mass index is 25.06 kg/m.  General: WDWN male in NAD GAIT:normal Eyes: PERRLA Pulmonary: Respirations are non-labored, CTAB.  Cardiac: regular rhythm, no detected murmur.  VASCULAR EXAM Carotid Bruits Right Left   negative negative    Abdominal aortic pulse is not palpable. Radial pulses are 2+ palpable and equal.      LE Pulses Right Left   POPLITEAL not palpable  not palpable   POSTERIOR TIBIAL  palpable   palpable    DORSALIS PEDIS  ANTERIOR TIBIAL palpable  palpable     Gastrointestinal: soft, nontender, BS WNL, no r/g,no palpable masses.  Musculoskeletal: No muscle atrophy/wasting. M/S 5/5 throughout, extremities without ischemic changes.  Neurologic: A&O X 3; appropriate affect, speech is normal, CN 2-12 intact, Pain and light touch intact in extremities, Motor exam as listed above    Assessment: Brian Hunt is a 71 y.o. male who has no history of stroke or TIA.  His atherosclerotic risk factors include smokingup until 2014. Fortunately he does not have DM, has a normal BMI, and exercises regularly.     I discussed with Dr. Oneida Alar pt carotid duplex results today compared to previous  studies, and that he remains asymptomatic of stroke or TIA; see Plan  DATA Carotid Duplex (06/14/17): Technically difficult study due to pt's respiratory pattern. Right ICA: 1-39% stenosis. Left ICA: 1-39% stenosis, however, plaque morphology does not support this disease category, and velocities may be grossly underestimated due to calcific plaque. Unable to compare to previous exam on 11-17-16 due to amount of acoustic shadowing.  Right ICA stenosis remains stable. The last several exams demonstrated 60-79% stenosis of the left ICA.     Plan: Follow-up in 6 months with Carotid Duplex scan.    I discussed in depth with  the patient the nature of atherosclerosis, and emphasized the importance of maximal medical management including strict control of blood pressure, blood glucose, and lipid levels, obtaining regular exercise, and continued cessation of smoking.  The patient is aware that without maximal medical management the underlying atherosclerotic disease process will progress, limiting the benefit of any interventions. The patient was given information about stroke prevention and what symptoms should prompt the patient to seek immediate medical care. Thank you for allowing [place] to participate in this patient's care.  Clemon Chambers, RN, MSN, FNP-C [facility] Office: [phone]  Clinic Physician: Oneida Alar  06/14/17 9:14 AM

## 2017-06-15 NOTE — Addendum Note (Signed)
Addended by: Lianne Cure A on: 06/15/2017 10:50 AM   Modules accepted: Orders

## 2017-06-26 LAB — PSA: PSA: 4.02

## 2017-07-13 ENCOUNTER — Other Ambulatory Visit: Payer: Self-pay | Admitting: Internal Medicine

## 2017-07-13 DIAGNOSIS — I6523 Occlusion and stenosis of bilateral carotid arteries: Secondary | ICD-10-CM

## 2017-07-13 DIAGNOSIS — I10 Essential (primary) hypertension: Secondary | ICD-10-CM

## 2017-07-16 ENCOUNTER — Encounter: Payer: Self-pay | Admitting: Internal Medicine

## 2017-07-16 ENCOUNTER — Ambulatory Visit (INDEPENDENT_AMBULATORY_CARE_PROVIDER_SITE_OTHER): Payer: Medicare Other | Admitting: Internal Medicine

## 2017-07-16 ENCOUNTER — Other Ambulatory Visit (INDEPENDENT_AMBULATORY_CARE_PROVIDER_SITE_OTHER): Payer: Medicare Other

## 2017-07-16 VITALS — BP 144/68 | HR 82 | Temp 98.0°F | Ht 67.0 in | Wt 157.0 lb

## 2017-07-16 DIAGNOSIS — E538 Deficiency of other specified B group vitamins: Secondary | ICD-10-CM

## 2017-07-16 DIAGNOSIS — E785 Hyperlipidemia, unspecified: Secondary | ICD-10-CM

## 2017-07-16 DIAGNOSIS — Z0001 Encounter for general adult medical examination with abnormal findings: Secondary | ICD-10-CM

## 2017-07-16 DIAGNOSIS — I10 Essential (primary) hypertension: Secondary | ICD-10-CM | POA: Diagnosis not present

## 2017-07-16 DIAGNOSIS — R972 Elevated prostate specific antigen [PSA]: Secondary | ICD-10-CM | POA: Diagnosis not present

## 2017-07-16 DIAGNOSIS — Z0000 Encounter for general adult medical examination without abnormal findings: Secondary | ICD-10-CM

## 2017-07-16 DIAGNOSIS — E871 Hypo-osmolality and hyponatremia: Secondary | ICD-10-CM

## 2017-07-16 DIAGNOSIS — Z23 Encounter for immunization: Secondary | ICD-10-CM

## 2017-07-16 DIAGNOSIS — N400 Enlarged prostate without lower urinary tract symptoms: Secondary | ICD-10-CM | POA: Diagnosis not present

## 2017-07-16 LAB — COMPREHENSIVE METABOLIC PANEL
ALBUMIN: 3.9 g/dL (ref 3.5–5.2)
ALK PHOS: 60 U/L (ref 39–117)
ALT: 26 U/L (ref 0–53)
AST: 21 U/L (ref 0–37)
BUN: 20 mg/dL (ref 6–23)
CO2: 27 mEq/L (ref 19–32)
Calcium: 9.5 mg/dL (ref 8.4–10.5)
Chloride: 96 mEq/L (ref 96–112)
Creatinine, Ser: 0.74 mg/dL (ref 0.40–1.50)
GFR: 110.8 mL/min (ref >60.00)
GLUCOSE: 94 mg/dL (ref 70–99)
POTASSIUM: 4.4 meq/L (ref 3.5–5.1)
Sodium: 131 mEq/L — ABNORMAL LOW (ref 135–145)
TOTAL PROTEIN: 7.3 g/dL (ref 6.0–8.3)
Total Bilirubin: 0.5 mg/dL (ref 0.2–1.2)

## 2017-07-16 LAB — VITAMIN B12: Vitamin B-12: 1201 pg/mL — ABNORMAL HIGH (ref 211–911)

## 2017-07-16 LAB — LIPID PANEL
CHOL/HDL RATIO: 2
CHOLESTEROL: 142 mg/dL (ref 0–200)
HDL: 79.8 mg/dL (ref >39.00)
LDL CALC: 54 mg/dL (ref 0–99)
NonHDL: 61.83
Triglycerides: 38 mg/dL (ref 0.0–149.0)
VLDL: 7.6 mg/dL (ref 0.0–40.0)

## 2017-07-16 LAB — FOLATE: FOLATE: 8.4 ng/mL (ref >5.9)

## 2017-07-16 LAB — TSH: TSH: 2.08 u[IU]/mL (ref 0.35–4.50)

## 2017-07-16 LAB — CORTISOL: CORTISOL PLASMA: 6.6 ug/dL

## 2017-07-16 NOTE — Progress Notes (Signed)
Subjective:  Patient ID: Brian Hunt, male    DOB: [birth year]  Age: 71 y.o. MRN: [id number]  CC: Annual Exam and Hypertension   HPI AVERI CACIOPPO presents for a CPX.  He has had a few episodes of gross hematuria over the last few weeks and is scheduled for a cystoscopy with his urologist. He otherwise feels well and offers no other complaints.  Past Medical History:  Diagnosis Date  . Anemia   . Anxiety   . Arthritis   . Basal cell carcinoma    Basal Cell X2 , Dr Radford Pax  . Bursitis of left hip    Dr. Ihor Gully  . Carotid artery occlusion   . COPD (chronic obstructive pulmonary disease) ([place])   . Diverticulosis   . Elevated PSA   . Hyperlipidemia   . Hypertension   . Internal hemorrhoids   . Prostatitis    Dr Terance Hart   Past Surgical History:  Procedure Laterality Date  . COLONOSCOPY  2010   negative; [facility]  . CYSTOSCOPY  10/2014   [facility]; neg  . GREEN LIGHT LASER TURP (TRANSURETHRAL RESECTION OF PROSTATE  09/27/2012   Procedure: GREEN LIGHT LASER TURP (TRANSURETHRAL RESECTION OF PROSTATE;  Surgeon: Fredricka Bonine, MD;  Location: [facility];  Service: Urology;  Laterality: N/A;     . JOINT REPLACEMENT Left Oct. 12, 2012   left total hip replacement by Dr. Mayer Camel  . PROSTATE BIOPSY  2007 , 2009   X2 , Dr Thomasene Mohair  & Dr Terance Hart  . PROSTATE SURGERY     Laser    reports that he quit smoking about 4 years ago. His smoking use included Cigarettes. He has a 10.00 pack-year smoking history. He has never used smokeless tobacco. He reports that he does not drink alcohol or use drugs. family history includes Arthritis in his mother; Bone cancer in his maternal grandmother; COPD in his father; Heart attack in his mother; Heart attack (age of onset: 6) in his paternal grandmother; Heart disease in his mother; Hyperlipidemia in his mother; Hypertension in his mother; Stroke in his mother; Transient ischemic attack in his mother; Vascular Disease in his  mother. Allergies  Allergen Reactions  . Lisinopril Cough    Outpatient Medications Prior to Visit  Medication Sig Dispense Refill  . amLODipine (NORVASC) 5 MG tablet TAKE 1 TABLET BY MOUTH EVERY DAY 90 tablet 0  . aspirin 81 MG tablet Take 81 mg by mouth daily.    . cyanocobalamin 2000 MCG tablet Take 1 tablet (2,000 mcg total) by mouth daily. (Patient taking differently: Take 2,000 mcg by mouth 2 days. ) 90 tablet 3  . FIBER PO Take 1 tablet by mouth 3 (three) times daily.    Marland Kitchen loratadine (CLARITIN) 10 MG tablet Take 10 mg by mouth daily.    . pravastatin (PRAVACHOL) 20 MG tablet TAKE 1 TABLET BY MOUTH DAILY 90 tablet 2  . telmisartan (MICARDIS) 40 MG tablet TAKE 1 TABLET(40 MG) BY MOUTH DAILY 90 tablet 0  . amLODipine (NORVASC) 5 MG tablet TAKE 1 TABLET BY MOUTH EVERY DAY 90 tablet 1  . telmisartan (MICARDIS) 40 MG tablet Take 1 tablet (40 mg total) by mouth daily. 90 tablet 0   No facility-administered medications prior to visit.     ROS Review of Systems  Constitutional: Negative.  Negative for appetite change, diaphoresis, fatigue and unexpected weight change.  HENT: Negative.   Eyes: Negative for visual disturbance.  Respiratory: Negative for cough,  chest tightness, shortness of breath and wheezing.   Cardiovascular: Negative.  Negative for chest pain, palpitations and leg swelling.  Gastrointestinal: Negative for abdominal pain, constipation, diarrhea, nausea and vomiting.  Endocrine: Negative.   Genitourinary: Positive for hematuria. Negative for difficulty urinating, dysuria, penile swelling, scrotal swelling and testicular pain.  Musculoskeletal: Negative.  Negative for back pain and myalgias.  Skin: Negative.   Allergic/Immunologic: Negative.   Neurological: Negative.  Negative for dizziness, weakness, light-headedness, numbness and headaches.  Hematological: Negative for adenopathy. Does not bruise/bleed easily.  Psychiatric/Behavioral: Negative.     Objective:   BP (!) 144/68 (BP Location: Right Arm, Patient Position: Sitting, Cuff Size: Normal)   Pulse 82   Temp 98 F (36.7 C) (Oral)   Ht 5\' 7"  (1.702 m)   Wt 157 lb (71.2 kg)   SpO2 99%   BMI 24.59 kg/m   BP Readings from Last 3 Encounters:  07/16/17 (!) 144/68  06/14/17 (!) 161/94  02/08/17 120/80    Wt Readings from Last 3 Encounters:  07/16/17 157 lb (71.2 kg)  06/14/17 160 lb (72.6 kg)  02/08/17 157 lb (71.2 kg)    Physical Exam  Constitutional: He is oriented to person, place, and time. No distress.  HENT:  Mouth/Throat: Oropharynx is clear and moist. No oropharyngeal exudate.  Eyes: Conjunctivae are normal. Right eye exhibits no discharge. Left eye exhibits no discharge. No scleral icterus.  Neck: Normal range of motion. Neck supple. No JVD present. No thyromegaly present.  Cardiovascular: Normal rate, regular rhythm and intact distal pulses.  Exam reveals no gallop and no friction rub.   No murmur heard. Pulmonary/Chest: Effort normal and breath sounds normal. No respiratory distress. He has no wheezes. He has no rales. He exhibits no tenderness.  Abdominal: Soft. Bowel sounds are normal. He exhibits no distension and no mass. There is no tenderness. There is no rebound and no guarding.  Genitourinary:  Genitourinary Comments: GU and rectal exams were deferred at his request since he tells me his urologist has done those within the last 2 weeks.  Musculoskeletal: Normal range of motion. He exhibits no edema, tenderness or deformity.  Lymphadenopathy:    He has no cervical adenopathy.  Neurological: He is alert and oriented to person, place, and time.  Skin: Skin is warm and dry. No rash noted. He is not diaphoretic. No erythema. No pallor.  Psychiatric: He has a normal mood and affect. His behavior is normal. Judgment and thought content normal.  Vitals reviewed.   Lab Results  Component Value Date   WBC 6.1 02/08/2017   HGB 13.6 02/08/2017   HCT 40.5 02/08/2017    PLT 325.0 02/08/2017   GLUCOSE 94 07/16/2017   CHOL 142 07/16/2017   TRIG 38.0 07/16/2017   HDL 79.80 07/16/2017   LDLCALC 54 07/16/2017   ALT 26 07/16/2017   AST 21 07/16/2017   NA 131 (L) 07/16/2017   K 4.4 07/16/2017   CL 96 07/16/2017   CREATININE 0.74 07/16/2017   BUN 20 07/16/2017   CO2 27 07/16/2017   TSH 2.08 07/16/2017   PSA 4.7 12/18/2016   INR 1.41 05/28/2011   HGBA1C 5.9 05/11/2015    No results found.  Assessment & Plan:   Ahmeer was seen today for annual exam and hypertension.  Diagnoses and all orders for this visit:  Need for influenza vaccination -     Flu vaccine HIGH DOSE PF (Fluzone High dose)  Benign prostatic hyperplasia without lower urinary tract symptoms-  managed by Urology -     Cancel: PSA; Future  Essential hypertension- his blood pressure is well controlled.  Electrolytes are stable.  Renal function is normal. Will cont the combo of a CCB and ARB -     TSH; Future -     Cancel: Urinalysis, Routine w reflex microscopic; Future -     Comprehensive metabolic panel; Future  PSA elevation- followed by Urology  Hyponatremia- His sodium level is up to 131.  He is asymptomatic with respect to this.  He takes no medications that would cause this. His cortisol level is normal.  Will continue to monitor this. -     Comprehensive metabolic panel; Future -     Cortisol; Future  Hyperlipidemia with target LDL less than 70- he has achieved his LDL goal and is doing well on the statin -     Lipid panel; Future -     TSH; Future  B12 deficiency- B12 level is a little high, he will decrease his B12 dose to QOD -     Vitamin B12; Future -     Folate; Future   I am having Mr. Elsberry maintain his aspirin, FIBER PO, loratadine, cyanocobalamin, pravastatin, amLODipine, and telmisartan.  No orders of the defined types were placed in this encounter.  See AVS for instructions about healthy living and anticipatory guidance.  Follow-up: Return in about 6  months (around 01/14/2018).  Scarlette Calico, MD

## 2017-07-16 NOTE — Patient Instructions (Signed)

## 2017-07-17 NOTE — Assessment & Plan Note (Signed)

## 2017-08-29 ENCOUNTER — Other Ambulatory Visit: Payer: Self-pay | Admitting: Internal Medicine

## 2017-08-29 DIAGNOSIS — I10 Essential (primary) hypertension: Secondary | ICD-10-CM

## 2017-09-18 HISTORY — PX: OTHER SURGICAL HISTORY: SHX169

## 2017-09-21 NOTE — Pre-Procedure Instructions (Signed)
Brian Hunt  09/21/2017      [facility] [place], [address] [place] [address] Phone: [phone] Fax: [phone]    Your procedure is scheduled on Wednesday, September 26, 2017  Report to [facility] Admitting Entrance "A" at 10:45AM  Call this number if you have problems the morning of surgery:  [phone]   Remember:  Do not eat food or drink liquids after midnight.  Take these medicines the morning of surgery with A SIP OF WATER: AmLODipine (NORVASC). If needed Loratadine (CLARITIN) for allergies.  Follow your doctor's instruction regarding Aspirin.  As of today, stop taking all Aspirins, Vitamins, Fish oils, and Herbal medications. Also stop all NSAIDS i.e. Advil, Ibuprofen, Motrin, Aleve, Anaprox, Naproxen, BC and Goody Powders.   Do not wear jewelry.  Do not wear lotions, powders, colognes, or deodorant.  Do not shave 48 hours prior to surgery.  Men may shave face and neck.  Do not bring valuables to the hospital.  [facility] is not responsible for any belongings or valuables.  Contacts, dentures or bridgework may not be worn into surgery.  Leave your suitcase in the car.  After surgery it may be brought to your room.  For patients admitted to the hospital, discharge time will be determined by your treatment team.  Patients discharged the day of surgery will not be allowed to drive home.   Special instructions:   [place]- Preparing For Surgery  Before surgery, you can play an important role. Because skin is not sterile, your skin needs to be as free of germs as possible. You can reduce the number of germs on your skin by washing with CHG (chlorahexidine gluconate) Soap before surgery.  CHG is an antiseptic cleaner which kills germs and bonds with the skin to continue killing germs even after washing.  Please do not use if you have an allergy to CHG  or antibacterial soaps. If your skin becomes reddened/irritated stop using the CHG.  Do not shave (including legs and underarms) for at least 48 hours prior to first CHG shower. It is OK to shave your face.  Please follow these instructions carefully.   1. Shower the NIGHT BEFORE SURGERY and the MORNING OF SURGERY with CHG.   2. If you chose to wash your hair, wash your hair first as usual with your normal shampoo.  3. After you shampoo, rinse your hair and body thoroughly to remove the shampoo.  4. Use CHG as you would any other liquid soap. You can apply CHG directly to the skin and wash gently with a scrungie or a clean washcloth.   5. Apply the CHG Soap to your body ONLY FROM THE NECK DOWN.  Do not use on open wounds or open sores. Avoid contact with your eyes, ears, mouth and genitals (private parts). Wash Face and genitals (private parts)  with your normal soap.  6. Wash thoroughly, paying special attention to the area where your surgery will be performed.  7. Thoroughly rinse your body with warm water from the neck down.  8. DO NOT shower/wash with your normal soap after using and rinsing off the CHG Soap.  9. Pat yourself dry with a CLEAN TOWEL.  10. Wear CLEAN PAJAMAS to bed the night before surgery, wear comfortable clothes the morning of surgery  11. Place CLEAN SHEETS on your bed  the night of your first shower and DO NOT SLEEP WITH PETS.  Day of Surgery: Do not apply any deodorants/lotions. Please wear clean clothes to the hospital/surgery center.    Please read over the following fact sheets that you were given. Pain Booklet, Coughing and Deep Breathing, Total Joint Packet, MRSA Information and Surgical Site Infection Prevention

## 2017-09-24 ENCOUNTER — Ambulatory Visit (HOSPITAL_COMMUNITY)
Admission: RE | Admit: 2017-09-24 | Discharge: 2017-09-24 | Disposition: A | Discharge status: Home / Self Care | Payer: Medicare Other | Source: Ambulatory Visit | Attending: Orthopedic Surgery | Admitting: Orthopedic Surgery

## 2017-09-24 ENCOUNTER — Encounter (HOSPITAL_COMMUNITY): Payer: Self-pay

## 2017-09-24 ENCOUNTER — Encounter (HOSPITAL_COMMUNITY)
Admission: RE | Admit: 2017-09-24 | Discharge: 2017-09-24 | Disposition: A | Discharge status: Home / Self Care | Payer: Medicare Other | Source: Ambulatory Visit | Attending: Orthopedic Surgery | Admitting: Orthopedic Surgery

## 2017-09-24 ENCOUNTER — Other Ambulatory Visit: Payer: Self-pay

## 2017-09-24 DIAGNOSIS — Z01810 Encounter for preprocedural cardiovascular examination: Secondary | ICD-10-CM | POA: Insufficient documentation

## 2017-09-24 DIAGNOSIS — I10 Essential (primary) hypertension: Secondary | ICD-10-CM | POA: Insufficient documentation

## 2017-09-24 DIAGNOSIS — M1611 Unilateral primary osteoarthritis, right hip: Secondary | ICD-10-CM | POA: Insufficient documentation

## 2017-09-24 DIAGNOSIS — Z01818 Encounter for other preprocedural examination: Secondary | ICD-10-CM

## 2017-09-24 DIAGNOSIS — Z01812 Encounter for preprocedural laboratory examination: Secondary | ICD-10-CM

## 2017-09-24 HISTORY — DX: Benign prostatic hyperplasia without lower urinary tract symptoms: N40.0

## 2017-09-24 HISTORY — DX: Adverse effect of unspecified anesthetic, initial encounter: T41.45XA

## 2017-09-24 HISTORY — DX: Unspecified right bundle-branch block: I45.10

## 2017-09-24 HISTORY — DX: Other complications of anesthesia, initial encounter: T88.59XA

## 2017-09-24 HISTORY — DX: Dyspnea, unspecified: R06.00

## 2017-09-24 LAB — CBC WITH DIFFERENTIAL/PLATELET
Basophils Absolute: 0.1 10*3/uL (ref 0.0–0.1)
Basophils Relative: 1 %
EOS PCT: 6 %
Eosinophils Absolute: 0.4 10*3/uL (ref 0.0–0.7)
HCT: 40.4 % (ref 39.0–52.0)
Hemoglobin: 13.4 g/dL (ref 13.0–17.0)
LYMPHS ABS: 1.6 10*3/uL (ref 0.7–4.0)
LYMPHS PCT: 22 %
MCH: 31.5 pg (ref 26.0–34.0)
MCHC: 33.2 g/dL (ref 30.0–36.0)
MCV: 95.1 fL (ref 78.0–100.0)
MONO ABS: 0.9 10*3/uL (ref 0.1–1.0)
Monocytes Relative: 13 %
Neutro Abs: 4.1 10*3/uL (ref 1.7–7.7)
Neutrophils Relative %: 58 %
PLATELETS: 332 10*3/uL (ref 150–400)
RBC: 4.25 MIL/uL (ref 4.22–5.81)
RDW: 13.5 % (ref 11.5–15.5)
WBC: 7.1 10*3/uL (ref 4.0–10.5)

## 2017-09-24 LAB — URINALYSIS, ROUTINE W REFLEX MICROSCOPIC
BILIRUBIN URINE: NEGATIVE
Glucose, UA: NEGATIVE mg/dL
Hgb urine dipstick: NEGATIVE
Ketones, ur: NEGATIVE mg/dL
Leukocytes, UA: NEGATIVE
NITRITE: NEGATIVE
Protein, ur: NEGATIVE mg/dL
SPECIFIC GRAVITY, URINE: 1.012 (ref 1.005–1.030)
pH: 6 (ref 5.0–8.0)

## 2017-09-24 LAB — BASIC METABOLIC PANEL
Anion gap: 9 (ref 5–15)
BUN: 14 mg/dL (ref 6–20)
CHLORIDE: 96 mmol/L — AB (ref 101–111)
CO2: 25 mmol/L (ref 22–32)
Calcium: 8.9 mg/dL (ref 8.9–10.3)
Creatinine, Ser: 0.88 mg/dL (ref 0.61–1.24)
GFR calc Af Amer: >60 mL/min (ref >60)
GFR calc non Af Amer: >60 mL/min (ref >60)
GLUCOSE: 92 mg/dL (ref 65–99)
POTASSIUM: 4.3 mmol/L (ref 3.5–5.1)
Sodium: 130 mmol/L — ABNORMAL LOW (ref 135–145)

## 2017-09-24 LAB — TYPE AND SCREEN
ABO/RH(D): O POS
ANTIBODY SCREEN: NEGATIVE

## 2017-09-24 LAB — SURGICAL PCR SCREEN
MRSA, PCR: NEGATIVE
Staphylococcus aureus: NEGATIVE

## 2017-09-24 LAB — PROTIME-INR
INR: 1.1
Prothrombin Time: 14.1 seconds (ref 11.4–15.2)

## 2017-09-24 LAB — APTT: aPTT: 35 seconds (ref 24–36)

## 2017-09-24 NOTE — Progress Notes (Signed)
PCP  Dr. Scarlette Calico  Pt. States he has never seen a cardiologist  Denies any cardiac testing  No c/o of chest pain or discomfort

## 2017-09-25 ENCOUNTER — Encounter (HOSPITAL_COMMUNITY): Payer: Self-pay | Admitting: Emergency Medicine

## 2017-09-25 ENCOUNTER — Encounter (HOSPITAL_COMMUNITY): Payer: Self-pay

## 2017-09-25 DIAGNOSIS — M1611 Unilateral primary osteoarthritis, right hip: Secondary | ICD-10-CM | POA: Diagnosis present

## 2017-09-25 MED ORDER — BUPIVACAINE LIPOSOME 1.3 % IJ SUSP
20.0000 mL | INTRAMUSCULAR | Status: AC
  Administered 2017-09-26: 20 mL
  Filled 2017-09-25: qty 20

## 2017-09-25 MED ORDER — SODIUM CHLORIDE 0.9 % IV SOLN
1000.0000 mg | INTRAVENOUS | Status: AC
  Administered 2017-09-26: 1000 mg via INTRAVENOUS
  Filled 2017-09-25: qty 1100

## 2017-09-25 MED ORDER — TRANEXAMIC ACID 1000 MG/10ML IV SOLN
2000.0000 mg | INTRAVENOUS | Status: AC
  Administered 2017-09-26: 2000 mg via TOPICAL
  Filled 2017-09-25: qty 20

## 2017-09-25 NOTE — H&P (Signed)
TOTAL HIP ADMISSION H&P  Patient is admitted for right total hip arthroplasty.  Subjective:  Chief Complaint: right hip pain  HPI: Brian Hunt, 72 y.o. male, has a history of pain and functional disability in the right hip(s) due to arthritis and patient has failed non-surgical conservative treatments for greater than 12 weeks to include NSAID's and/or analgesics, flexibility and strengthening excercises, use of assistive devices, weight reduction as appropriate and activity modification.  Onset of symptoms was gradual starting 1 years ago with rapidlly worsening course since that time.The patient noted no past surgery on the right hip(s).  Patient currently rates pain in the right hip at 10 out of 10 with activity. Patient has night pain, worsening of pain with activity and weight bearing, trendelenberg gait, pain that interfers with activities of daily living and pain with passive range of motion. Patient has evidence of joint space narrowing by imaging studies. This condition presents safety issues increasing the risk of falls.    There is no current active infection.  Patient Active Problem List   Diagnosis Date Noted  . B12 deficiency 02/08/2017  . BPH (benign prostatic hyperplasia) 06/14/2016  . Routine general medical examination at a health care facility 01/13/2016  . Screening for colon cancer 01/13/2016  . Simple chronic bronchitis ([place]) 01/13/2016  . Occlusion and stenosis of carotid artery without mention of cerebral infarction 05/01/2014  . Basal cell carcinoma 02/26/2014  . 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 Hyponatremia 07/04/2013  . Ex-smoker 07/04/2013  . HTN (hypertension) 12/25/2012  . Hyperlipidemia with target LDL less than 70 12/25/2012  . Macular degeneration of both eyes 12/25/2012  . Carotid artery stenosis 03/17/2011   Past Medical History:  Diagnosis Date  . Anemia   . Anxiety   . Arthritis   . Basal cell carcinoma    Basal Cell X2 , Dr Radford Pax  . BPH  (benign prostatic hyperplasia)   . Bursitis of left hip    Dr. Ihor Gully  . Carotid artery occlusion   . Complication of anesthesia    pt. states he had vasal vagal reaction after last surgery,after he got to his room  . COPD (chronic obstructive pulmonary disease) ([place])   . Diverticulosis   . Dyspnea    with exertion  . Elevated PSA   . Hyperlipidemia   . Hypertension   . Internal hemorrhoids   . Prostatitis    Dr Terance Hart    Past Surgical History:  Procedure Laterality Date  . COLONOSCOPY  2010   negative; [facility]  . CYSTOSCOPY  10/2014   [facility]; neg  . GREEN LIGHT LASER TURP (TRANSURETHRAL RESECTION OF PROSTATE  09/27/2012   Procedure: GREEN LIGHT LASER TURP (TRANSURETHRAL RESECTION OF PROSTATE;  Surgeon: Fredricka Bonine, MD;  Location: [facility];  Service: Urology;  Laterality: N/A;     . JOINT REPLACEMENT Left Oct. 12, 2012   left total hip replacement by Dr. Mayer Camel  . PROSTATE BIOPSY  2007 , 2009   X2 , Dr Thomasene Mohair  & Dr Terance Hart  . PROSTATE SURGERY     Laser    No current facility-administered medications for this encounter.    Current Outpatient Medications  Medication Sig Dispense Refill Last Dose  . amLODipine (NORVASC) 5 MG tablet TAKE 1 TABLET BY MOUTH EVERY DAY 90 tablet 0   . aspirin EC 81 MG tablet Take 81 mg by mouth daily.     . cyanocobalamin 2000 MCG tablet Take 1 tablet (2,000 mcg total)  by mouth daily. (Patient taking differently: Take 2,000 mcg by mouth every other day. ) 90 tablet 3 Taking  . ibuprofen (ADVIL,MOTRIN) 200 MG tablet Take 600 mg by mouth every 8 (eight) hours as needed (for pain.).     Marland Kitchen loratadine (CLARITIN) 10 MG tablet Take 10 mg by mouth daily as needed for allergies.    Taking  . Multiple Vitamins-Minerals (PRESERVISION AREDS 2 PO) Take 1 tablet by mouth daily.     . pravastatin (PRAVACHOL) 20 MG tablet TAKE 1 TABLET BY MOUTH DAILY 90 tablet 0   . telmisartan (MICARDIS) 40 MG tablet TAKE 1 TABLET(40 MG) BY MOUTH DAILY 90  tablet 0 Taking  . Turmeric Curcumin 500 MG CAPS Take 500 mg by mouth daily.      Allergies  Allergen Reactions  . Oxycodone Shortness Of Breath  . Lisinopril Cough    Social History   Tobacco Use  . Smoking status: Current Every Day Smoker    Packs/day: 0.00    Years: 40.00    Pack years: 0.00    Types: Cigarettes  . Smokeless tobacco: Never Used  . Tobacco comment: had quit but started back, smokes 1-2 cig a day  Substance Use Topics  . Alcohol use: No    Comment: 2001- states no rehab    Family History  Problem Relation Age of Onset  . Arthritis Mother   . Vascular Disease Mother        Varicose Veins  . Hypertension Mother   . Hyperlipidemia Mother   . Transient ischemic attack Mother   . Heart disease Mother        After age 2  . Stroke Mother   . Heart attack Mother   . COPD Father   . Bone cancer Maternal Grandmother   . Heart attack Paternal Grandmother 75  . Asthma Neg Hx   . Diabetes Neg Hx   . Colon cancer Neg Hx   . Pancreatic cancer Neg Hx   . Esophageal cancer Neg Hx   . Stomach cancer Neg Hx   . Liver disease Neg Hx      Review of Systems  Constitutional: Positive for malaise/fatigue.  HENT: Positive for sinus pain.   Eyes: Negative.   Respiratory: Negative.   Cardiovascular:       HTN  Gastrointestinal: Negative.   Genitourinary: Positive for frequency and hematuria.       Weak stream, Enlarged prostate and ED  Musculoskeletal: Positive for joint pain and myalgias.  Neurological: Negative.   Endo/Heme/Allergies: Bruises/bleeds easily.  Psychiatric/Behavioral: Negative.     Objective:  Physical Exam  Constitutional: He is oriented to person, place, and time. He appears well-developed and well-nourished.  HENT:  Head: Normocephalic and atraumatic.  Eyes: Pupils are equal, round, and reactive to light.  Neck: Normal range of motion. Neck supple.  Cardiovascular: Intact distal pulses.  Respiratory: Effort normal.  Musculoskeletal:   He walks with a right-sided Trendelenburg gait, internal rotation of the right hip reproduces his pain at 10.  He can external rotate to 30.  Surgical scar the left hip is well healed internal and external rotation are full.  Foot tap is are negative bilaterally.  Skin is intact.  Neurovascular intact distally.  Neurological: He is alert and oriented to person, place, and time.  Skin: Skin is warm and dry.  Psychiatric: He has a normal mood and affect. His behavior is normal. Judgment and thought content normal.    Vital signs in last  24 hours: Temp:  [97.6 F (36.4 C)] 97.6 F (36.4 C) (01/07 0957) Pulse Rate:  [69] 69 (01/07 0957) Resp:  [20] 20 (01/07 0957) BP: (147)/(69) 147/69 (01/07 0957) SpO2:  [99 %] 99 % (01/07 0957) Weight:  [72 kg (158 lb 11.2 oz)] 72 kg (158 lb 11.2 oz) (01/07 0957)  Labs:   Estimated body mass index is 24.86 kg/m as calculated from the following:   Height as of 09/24/17: 5\' 7"  (1.702 m).   Weight as of 09/24/17: 72 kg (158 lb 11.2 oz).   Imaging Review Plain radiographs demonstrate  AP pelvis and crosstable lateral the right hip show near bone-on-bone arthritis to the superior weight-bearing dome of the femoral head and acetabulum.  The left hip which is an S-ROM on Pinnacle design is well-placed well fixed with no evidence of loosening or malposition.  Assessment/Plan:  End stage arthritis, right hip(s)  The patient history, physical examination, clinical judgement of the provider and imaging studies are consistent with end stage degenerative joint disease of the right hip(s) and total hip arthroplasty is deemed medically necessary. The treatment options including medical management, injection therapy, arthroscopy and arthroplasty were discussed at length. The risks and benefits of total hip arthroplasty were presented and reviewed. The risks due to aseptic loosening, infection, stiffness, dislocation/subluxation,  thromboembolic complications and other  imponderables were discussed.  The patient acknowledged the explanation, agreed to proceed with the plan and consent was signed. Patient is being admitted for inpatient treatment for surgery, pain control, PT, OT, prophylactic antibiotics, VTE prophylaxis, progressive ambulation and ADL's and discharge planning.The patient is planning to be discharged home with home health services.

## 2017-09-26 ENCOUNTER — Inpatient Hospital Stay (HOSPITAL_COMMUNITY)
Admission: RE | Admit: 2017-09-26 | Discharge: 2017-09-27 | DRG: 470 | Disposition: A | Discharge status: Home Health | Payer: Medicare Other | Source: Ambulatory Visit | Attending: Orthopedic Surgery | Admitting: Orthopedic Surgery

## 2017-09-26 ENCOUNTER — Inpatient Hospital Stay (HOSPITAL_COMMUNITY): Payer: Medicare Other | Admitting: Emergency Medicine

## 2017-09-26 ENCOUNTER — Inpatient Hospital Stay (HOSPITAL_COMMUNITY): Payer: Medicare Other

## 2017-09-26 ENCOUNTER — Encounter (HOSPITAL_COMMUNITY)
Admission: RE | Disposition: A | Discharge status: Home / Self Care | Payer: Self-pay | Source: Ambulatory Visit | Attending: Orthopedic Surgery

## 2017-09-26 ENCOUNTER — Other Ambulatory Visit: Payer: Self-pay

## 2017-09-26 ENCOUNTER — Encounter (HOSPITAL_COMMUNITY): Payer: Self-pay | Admitting: *Deleted

## 2017-09-26 DIAGNOSIS — H3530 Unspecified macular degeneration: Secondary | ICD-10-CM | POA: Diagnosis present

## 2017-09-26 DIAGNOSIS — F419 Anxiety disorder, unspecified: Secondary | ICD-10-CM | POA: Diagnosis present

## 2017-09-26 DIAGNOSIS — I739 Peripheral vascular disease, unspecified: Secondary | ICD-10-CM | POA: Diagnosis present

## 2017-09-26 DIAGNOSIS — F17210 Nicotine dependence, cigarettes, uncomplicated: Secondary | ICD-10-CM | POA: Diagnosis present

## 2017-09-26 DIAGNOSIS — E785 Hyperlipidemia, unspecified: Secondary | ICD-10-CM | POA: Diagnosis present

## 2017-09-26 DIAGNOSIS — Z7982 Long term (current) use of aspirin: Secondary | ICD-10-CM

## 2017-09-26 DIAGNOSIS — N400 Enlarged prostate without lower urinary tract symptoms: Secondary | ICD-10-CM | POA: Diagnosis present

## 2017-09-26 DIAGNOSIS — Z85828 Personal history of other malignant neoplasm of skin: Secondary | ICD-10-CM | POA: Diagnosis not present

## 2017-09-26 DIAGNOSIS — I10 Essential (primary) hypertension: Secondary | ICD-10-CM | POA: Diagnosis present

## 2017-09-26 DIAGNOSIS — M1611 Unilateral primary osteoarthritis, right hip: Principal | ICD-10-CM | POA: Diagnosis present

## 2017-09-26 DIAGNOSIS — Z419 Encounter for procedure for purposes other than remedying health state, unspecified: Secondary | ICD-10-CM

## 2017-09-26 DIAGNOSIS — Z96642 Presence of left artificial hip joint: Secondary | ICD-10-CM | POA: Diagnosis present

## 2017-09-26 DIAGNOSIS — D62 Acute posthemorrhagic anemia: Secondary | ICD-10-CM | POA: Diagnosis not present

## 2017-09-26 DIAGNOSIS — J449 Chronic obstructive pulmonary disease, unspecified: Secondary | ICD-10-CM | POA: Diagnosis present

## 2017-09-26 HISTORY — PX: TOTAL HIP ARTHROPLASTY: SHX124

## 2017-09-26 SURGERY — ARTHROPLASTY, HIP, TOTAL, ANTERIOR APPROACH
Anesthesia: Spinal | Laterality: Right

## 2017-09-26 MED ORDER — LACTATED RINGERS IV SOLN
INTRAVENOUS | Status: DC
  Administered 2017-09-26 (×2): via INTRAVENOUS

## 2017-09-26 MED ORDER — GLYCOPYRROLATE 0.2 MG/ML IJ SOLN
INTRAMUSCULAR | Status: DC | PRN
  Administered 2017-09-26: 0.1 mg via INTRAVENOUS
  Administered 2017-09-26: .1 mg via INTRAVENOUS

## 2017-09-26 MED ORDER — 0.9 % SODIUM CHLORIDE (POUR BTL) OPTIME
TOPICAL | Status: DC | PRN
  Administered 2017-09-26: 1000 mL

## 2017-09-26 MED ORDER — FENTANYL CITRATE (PF) 250 MCG/5ML IJ SOLN
INTRAMUSCULAR | Status: DC | PRN
  Administered 2017-09-26: 50 ug via INTRAVENOUS

## 2017-09-26 MED ORDER — DIPHENHYDRAMINE HCL 12.5 MG/5ML PO ELIX: 12.5000 mg | ORAL_SOLUTION | ORAL | Status: DC | PRN

## 2017-09-26 MED ORDER — DOCUSATE SODIUM 100 MG PO CAPS
100.0000 mg | ORAL_CAPSULE | Freq: Two times a day (BID) | ORAL | Status: DC
  Administered 2017-09-26 – 2017-09-27 (×2): 100 mg via ORAL
  Filled 2017-09-26 (×2): qty 1

## 2017-09-26 MED ORDER — DEXAMETHASONE SODIUM PHOSPHATE 10 MG/ML IJ SOLN
10.0000 mg | Freq: Once | INTRAMUSCULAR | Status: AC
  Administered 2017-09-27: 10 mg via INTRAVENOUS
  Filled 2017-09-26: qty 1

## 2017-09-26 MED ORDER — ASPIRIN EC 325 MG PO TBEC: 325.0000 mg | DELAYED_RELEASE_TABLET | Freq: Two times a day (BID) | ORAL | 0 refills | Status: DC

## 2017-09-26 MED ORDER — MIDAZOLAM HCL 2 MG/2ML IJ SOLN
INTRAMUSCULAR | Status: AC
  Filled 2017-09-26: qty 2

## 2017-09-26 MED ORDER — ONDANSETRON HCL 4 MG/2ML IJ SOLN
INTRAMUSCULAR | Status: DC | PRN
  Administered 2017-09-26: 4 mg via INTRAVENOUS

## 2017-09-26 MED ORDER — FENTANYL CITRATE (PF) 100 MCG/2ML IJ SOLN
INTRAMUSCULAR | Status: AC
  Administered 2017-09-26: 50 ug via INTRAVENOUS
  Filled 2017-09-26: qty 2

## 2017-09-26 MED ORDER — BUPIVACAINE IN DEXTROSE 0.75-8.25 % IT SOLN
INTRATHECAL | Status: DC | PRN
  Administered 2017-09-26: 2 mL via INTRATHECAL

## 2017-09-26 MED ORDER — ZOLPIDEM TARTRATE 5 MG PO TABS
5.0000 mg | ORAL_TABLET | Freq: Every evening | ORAL | Status: DC | PRN
Start: 2017-09-26 — End: 2017-09-27

## 2017-09-26 MED ORDER — EPHEDRINE 5 MG/ML INJ
INTRAVENOUS | Status: AC
  Filled 2017-09-26: qty 10

## 2017-09-26 MED ORDER — BUPIVACAINE-EPINEPHRINE (PF) 0.5% -1:200000 IJ SOLN
INTRAMUSCULAR | Status: DC | PRN
  Administered 2017-09-26: 30 mL via PERINEURAL

## 2017-09-26 MED ORDER — SENNOSIDES-DOCUSATE SODIUM 8.6-50 MG PO TABS: 1.0000 | ORAL_TABLET | Freq: Every evening | ORAL | Status: DC | PRN

## 2017-09-26 MED ORDER — ALUM & MAG HYDROXIDE-SIMETH 200-200-20 MG/5ML PO SUSP: 30.0000 mL | ORAL | Status: DC | PRN

## 2017-09-26 MED ORDER — PROPOFOL 10 MG/ML IV BOLUS
INTRAVENOUS | Status: AC
  Filled 2017-09-26: qty 20

## 2017-09-26 MED ORDER — METOCLOPRAMIDE HCL 5 MG/ML IJ SOLN: 5.0000 mg | Freq: Three times a day (TID) | INTRAMUSCULAR | Status: DC | PRN

## 2017-09-26 MED ORDER — ACETAMINOPHEN 650 MG RE SUPP: 650.0000 mg | RECTAL | Status: DC | PRN

## 2017-09-26 MED ORDER — ONDANSETRON HCL 4 MG/2ML IJ SOLN: 4.0000 mg | Freq: Once | INTRAMUSCULAR | Status: DC | PRN

## 2017-09-26 MED ORDER — METHOCARBAMOL 1000 MG/10ML IJ SOLN: 500.0000 mg | Freq: Four times a day (QID) | INTRAMUSCULAR | Status: DC | PRN

## 2017-09-26 MED ORDER — CELECOXIB 200 MG PO CAPS
200.0000 mg | ORAL_CAPSULE | Freq: Two times a day (BID) | ORAL | Status: DC
  Administered 2017-09-26 – 2017-09-27 (×2): 200 mg via ORAL
  Filled 2017-09-26 (×2): qty 1

## 2017-09-26 MED ORDER — FENTANYL CITRATE (PF) 250 MCG/5ML IJ SOLN
INTRAMUSCULAR | Status: AC
  Filled 2017-09-26: qty 5

## 2017-09-26 MED ORDER — FENTANYL CITRATE (PF) 100 MCG/2ML IJ SOLN
25.0000 ug | INTRAMUSCULAR | Status: DC | PRN
  Administered 2017-09-26 (×3): 50 ug via INTRAVENOUS

## 2017-09-26 MED ORDER — PHENOL 1.4 % MT LIQD: 1.0000 | OROMUCOSAL | Status: DC | PRN

## 2017-09-26 MED ORDER — ONDANSETRON HCL 4 MG PO TABS: 4.0000 mg | ORAL_TABLET | Freq: Four times a day (QID) | ORAL | Status: DC | PRN

## 2017-09-26 MED ORDER — ONDANSETRON HCL 4 MG/2ML IJ SOLN
INTRAMUSCULAR | Status: AC
  Filled 2017-09-26: qty 2

## 2017-09-26 MED ORDER — CHLORHEXIDINE GLUCONATE 4 % EX LIQD: 60.0000 mL | Freq: Once | CUTANEOUS | Status: DC

## 2017-09-26 MED ORDER — HYDROMORPHONE HCL 2 MG PO TABS: 2.0000 mg | ORAL_TABLET | ORAL | 0 refills | Status: DC | PRN

## 2017-09-26 MED ORDER — MENTHOL 3 MG MT LOZG: 1.0000 | LOZENGE | OROMUCOSAL | Status: DC | PRN

## 2017-09-26 MED ORDER — IRBESARTAN 150 MG PO TABS
300.0000 mg | ORAL_TABLET | Freq: Every day | ORAL | Status: DC
  Administered 2017-09-26 – 2017-09-27 (×2): 300 mg via ORAL
  Filled 2017-09-26 (×2): qty 2

## 2017-09-26 MED ORDER — ASPIRIN EC 325 MG PO TBEC
325.0000 mg | DELAYED_RELEASE_TABLET | Freq: Every day | ORAL | Status: DC
  Administered 2017-09-27: 325 mg via ORAL
  Filled 2017-09-26: qty 1

## 2017-09-26 MED ORDER — GABAPENTIN 300 MG PO CAPS
300.0000 mg | ORAL_CAPSULE | Freq: Three times a day (TID) | ORAL | Status: DC
  Administered 2017-09-26 – 2017-09-27 (×3): 300 mg via ORAL
  Filled 2017-09-26 (×3): qty 1

## 2017-09-26 MED ORDER — PHENYLEPHRINE HCL 10 MG/ML IJ SOLN
INTRAVENOUS | Status: DC | PRN
  Administered 2017-09-26: 20 ug/min via INTRAVENOUS

## 2017-09-26 MED ORDER — HYDROMORPHONE HCL 1 MG/ML IJ SOLN
0.5000 mg | INTRAMUSCULAR | Status: DC | PRN
  Administered 2017-09-26 (×2): 0.5 mg via INTRAVENOUS
  Filled 2017-09-26 (×2): qty 1

## 2017-09-26 MED ORDER — BISACODYL 5 MG PO TBEC
5.0000 mg | DELAYED_RELEASE_TABLET | Freq: Every day | ORAL | Status: DC | PRN
Start: 2017-09-26 — End: 2017-09-27

## 2017-09-26 MED ORDER — METHOCARBAMOL 500 MG PO TABS
500.0000 mg | ORAL_TABLET | Freq: Four times a day (QID) | ORAL | Status: DC | PRN
  Administered 2017-09-26 – 2017-09-27 (×2): 500 mg via ORAL
  Filled 2017-09-26 (×2): qty 1

## 2017-09-26 MED ORDER — CEFAZOLIN SODIUM-DEXTROSE 2-4 GM/100ML-% IV SOLN
INTRAVENOUS | Status: AC
Start: 2017-09-26 — End: 2017-09-26
  Filled 2017-09-26: qty 100

## 2017-09-26 MED ORDER — TIZANIDINE HCL 2 MG PO TABS: 2.0000 mg | ORAL_TABLET | Freq: Four times a day (QID) | ORAL | 0 refills | Status: DC | PRN

## 2017-09-26 MED ORDER — FENTANYL CITRATE (PF) 100 MCG/2ML IJ SOLN
INTRAMUSCULAR | Status: AC
  Filled 2017-09-26: qty 2

## 2017-09-26 MED ORDER — FLEET ENEMA 7-19 GM/118ML RE ENEM: 1.0000 | ENEMA | Freq: Once | RECTAL | Status: DC | PRN

## 2017-09-26 MED ORDER — ONDANSETRON HCL 4 MG/2ML IJ SOLN
4.0000 mg | Freq: Four times a day (QID) | INTRAMUSCULAR | Status: DC | PRN
  Administered 2017-09-26: 4 mg via INTRAVENOUS
  Filled 2017-09-26: qty 2

## 2017-09-26 MED ORDER — CEFAZOLIN SODIUM-DEXTROSE 2-4 GM/100ML-% IV SOLN
2.0000 g | INTRAVENOUS | Status: AC
  Administered 2017-09-26: 2 g via INTRAVENOUS

## 2017-09-26 MED ORDER — ACETAMINOPHEN 325 MG PO TABS: 650.0000 mg | ORAL_TABLET | ORAL | Status: DC | PRN

## 2017-09-26 MED ORDER — PROPOFOL 10 MG/ML IV BOLUS
INTRAVENOUS | Status: DC | PRN
  Administered 2017-09-26 (×2): 20 mg via INTRAVENOUS
  Administered 2017-09-26: 30 mg via INTRAVENOUS

## 2017-09-26 MED ORDER — KCL IN DEXTROSE-NACL 20-5-0.45 MEQ/L-%-% IV SOLN
INTRAVENOUS | Status: DC
  Administered 2017-09-26 – 2017-09-27 (×2): via INTRAVENOUS
  Filled 2017-09-26 (×2): qty 1000

## 2017-09-26 MED ORDER — EPHEDRINE SULFATE-NACL 50-0.9 MG/10ML-% IV SOSY
PREFILLED_SYRINGE | INTRAVENOUS | Status: DC | PRN
  Administered 2017-09-26: 5 mg via INTRAVENOUS

## 2017-09-26 MED ORDER — PROPOFOL 500 MG/50ML IV EMUL
INTRAVENOUS | Status: DC | PRN
  Administered 2017-09-26: 100 ug/kg/min via INTRAVENOUS

## 2017-09-26 MED ORDER — AMLODIPINE BESYLATE 5 MG PO TABS
5.0000 mg | ORAL_TABLET | Freq: Every day | ORAL | Status: DC
  Administered 2017-09-27: 5 mg via ORAL
  Filled 2017-09-26: qty 1

## 2017-09-26 MED ORDER — HYDROMORPHONE HCL 2 MG PO TABS
2.0000 mg | ORAL_TABLET | ORAL | Status: DC | PRN
  Administered 2017-09-27 (×2): 2 mg via ORAL
  Filled 2017-09-26 (×2): qty 1

## 2017-09-26 MED ORDER — LORATADINE 10 MG PO TABS: 10.0000 mg | ORAL_TABLET | Freq: Every day | ORAL | Status: DC | PRN

## 2017-09-26 MED ORDER — BUPIVACAINE-EPINEPHRINE (PF) 0.5% -1:200000 IJ SOLN
INTRAMUSCULAR | Status: AC
  Filled 2017-09-26: qty 30

## 2017-09-26 MED ORDER — TRANEXAMIC ACID 1000 MG/10ML IV SOLN
1000.0000 mg | Freq: Once | INTRAVENOUS | Status: DC
  Filled 2017-09-26: qty 10

## 2017-09-26 MED ORDER — MIDAZOLAM HCL 5 MG/5ML IJ SOLN
INTRAMUSCULAR | Status: DC | PRN
  Administered 2017-09-26: 2 mg via INTRAVENOUS

## 2017-09-26 MED ORDER — METOCLOPRAMIDE HCL 5 MG PO TABS: 5.0000 mg | ORAL_TABLET | Freq: Three times a day (TID) | ORAL | Status: DC | PRN

## 2017-09-26 SURGICAL SUPPLY — 42 items
BAG DECANTER FOR FLEXI CONT (MISCELLANEOUS) ×3 IMPLANT
BLADE SAW SGTL 18X1.27X75 (BLADE) ×2 IMPLANT
BLADE SAW SGTL 18X1.27X75MM (BLADE) ×1
CAPT HIP TOTAL 2 ×2 IMPLANT
COVER PERINEAL POST (MISCELLANEOUS) ×3 IMPLANT
COVER SURGICAL LIGHT HANDLE (MISCELLANEOUS) ×3 IMPLANT
DRAPE C-ARM 42X72 X-RAY (DRAPES) ×3 IMPLANT
DRAPE STERI IOBAN 125X83 (DRAPES) ×3 IMPLANT
DRAPE U-SHAPE 47X51 STRL (DRAPES) ×6 IMPLANT
DRSG AQUACEL AG ADV 3.5X10 (GAUZE/BANDAGES/DRESSINGS) ×3 IMPLANT
DURAPREP 26ML APPLICATOR (WOUND CARE) ×3 IMPLANT
ELECT BLADE 4.0 EZ CLEAN MEGAD (MISCELLANEOUS) ×3
ELECT REM PT RETURN 9FT ADLT (ELECTROSURGICAL) ×3
ELECTRODE BLDE 4.0 EZ CLN MEGD (MISCELLANEOUS) ×1 IMPLANT
ELECTRODE REM PT RTRN 9FT ADLT (ELECTROSURGICAL) ×1 IMPLANT
FACESHIELD WRAPAROUND (MASK) ×6 IMPLANT
FACESHIELD WRAPAROUND OR TEAM (MASK) ×2 IMPLANT
GLOVE BIO SURGEON STRL SZ7.5 (GLOVE) ×3 IMPLANT
GLOVE BIO SURGEON STRL SZ8.5 (GLOVE) ×3 IMPLANT
GLOVE BIOGEL PI IND STRL 8 (GLOVE) ×1 IMPLANT
GLOVE BIOGEL PI IND STRL 9 (GLOVE) ×1 IMPLANT
GLOVE BIOGEL PI INDICATOR 8 (GLOVE) ×2
GLOVE BIOGEL PI INDICATOR 9 (GLOVE) ×2
GOWN STRL REUS W/ TWL LRG LVL3 (GOWN DISPOSABLE) ×1 IMPLANT
GOWN STRL REUS W/ TWL XL LVL3 (GOWN DISPOSABLE) ×2 IMPLANT
GOWN STRL REUS W/TWL LRG LVL3 (GOWN DISPOSABLE) ×3
GOWN STRL REUS W/TWL XL LVL3 (GOWN DISPOSABLE) ×6
KIT BASIN OR (CUSTOM PROCEDURE TRAY) ×3 IMPLANT
KIT ROOM TURNOVER OR (KITS) ×3 IMPLANT
MANIFOLD NEPTUNE II (INSTRUMENTS) ×3 IMPLANT
NEEDLE HYPO 22GX1.5 SAFETY (NEEDLE) ×6 IMPLANT
NS IRRIG 1000ML POUR BTL (IV SOLUTION) ×3 IMPLANT
PACK TOTAL JOINT (CUSTOM PROCEDURE TRAY) ×3 IMPLANT
PAD ARMBOARD 7.5X6 YLW CONV (MISCELLANEOUS) ×6 IMPLANT
SUT VIC AB 1 CTX 36 (SUTURE) ×3
SUT VIC AB 1 CTX36XBRD ANBCTR (SUTURE) ×1 IMPLANT
SUT VIC AB 2-0 CT1 27 (SUTURE) ×3
SUT VIC AB 2-0 CT1 TAPERPNT 27 (SUTURE) ×1 IMPLANT
SUT VIC AB 3-0 CT1 27 (SUTURE) ×3
SUT VIC AB 3-0 CT1 TAPERPNT 27 (SUTURE) ×1 IMPLANT
SYR CONTROL 10ML LL (SYRINGE) ×6 IMPLANT
TRAY FOLEY CATH 14FR (SET/KITS/TRAYS/PACK) ×2 IMPLANT

## 2017-09-26 NOTE — Plan of Care (Signed)
  Progressing Education: Knowledge of General Education information will improve 09/26/2017 1801 - Progressing by Rance Muir, RN Health Behavior/Discharge Planning: Ability to manage health-related needs will improve 09/26/2017 1801 - Progressing by Rance Muir, RN Clinical Measurements: Ability to maintain clinical measurements within normal limits will improve 09/26/2017 1801 - Progressing by Rance Muir, RN Will remain free from infection 09/26/2017 1801 - Progressing by Rance Muir, RN Diagnostic test results will improve 09/26/2017 1801 - Progressing by Rance Muir, RN Respiratory complications will improve 09/26/2017 1801 - Progressing by Rance Muir, RN Cardiovascular complication will be avoided 09/26/2017 1801 - Progressing by Rance Muir, RN Activity: Risk for activity intolerance will decrease 09/26/2017 1801 - Progressing by Rance Muir, RN Nutrition: Adequate nutrition will be maintained 09/26/2017 1801 - Progressing by Rance Muir, RN Coping: Level of anxiety will decrease 09/26/2017 1801 - Progressing by Rance Muir, RN Elimination: Will not experience complications related to bowel motility 09/26/2017 1801 - Progressing by Rance Muir, RN Will not experience complications related to urinary retention 09/26/2017 1801 - Progressing by Rance Muir, RN Pain Managment: General experience of comfort will improve 09/26/2017 1801 - Progressing by Rance Muir, RN Safety: Ability to remain free from injury will improve 09/26/2017 1801 - Progressing by Rance Muir, RN Skin Integrity: Risk for impaired skin integrity will decrease 09/26/2017 1801 - Progressing by Rance Muir, RN

## 2017-09-26 NOTE — Progress Notes (Signed)
Orthopedic Tech Progress Note Patient Details:  Brian Hunt 10-28-1945 [id number]  Patient ID: Brian Hunt, male   DOB: 08/03/1946, 72 y.o.   MRN: [id number] Pt cant have ohf due to age restrictions  [name] 09/26/2017, 11:13 PM

## 2017-09-26 NOTE — Anesthesia Preprocedure Evaluation (Addendum)
Anesthesia Evaluation  Patient identified by MRN, date of birth, ID band Patient awake    Reviewed: Allergy & Precautions, H&P , NPO status , Patient's Chart, lab work & pertinent test results  Airway Mallampati: III  TM Distance: >3 FB Neck ROM: Full    Dental  (+) Partial Lower   Pulmonary COPD (mild, controlled), Current Smoker,    Pulmonary exam normal breath sounds clear to auscultation       Cardiovascular hypertension, Pt. on medications + Peripheral Vascular Disease  Normal cardiovascular exam Rhythm:Regular Rate:Normal  ECG: SB, rate 50   Neuro/Psych Anxiety negative neurological ROS     GI/Hepatic negative GI ROS, Neg liver ROS,   Endo/Other  negative endocrine ROS  Renal/GU negative Renal ROS     Musculoskeletal  (+) Arthritis , Osteoarthritis,    Abdominal   Peds  Hematology HLD   Anesthesia Other Findings Left eye injection  Reproductive/Obstetrics                            Anesthesia Physical  Anesthesia Plan  ASA: III  Anesthesia Plan: Spinal   Post-op Pain Management:    Induction: Intravenous  PONV Risk Score and Plan: 0 and Treatment may vary due to age or medical condition and Propofol infusion  Airway Management Planned: Natural Airway  Additional Equipment:   Intra-op Plan:   Post-operative Plan:   Informed Consent: I have reviewed the patients History and Physical, chart, labs and discussed the procedure including the risks, benefits and alternatives for the proposed anesthesia with the patient or authorized representative who has indicated his/her understanding and acceptance.   Dental advisory given  Plan Discussed with: CRNA  Anesthesia Plan Comments:         Anesthesia Quick Evaluation

## 2017-09-26 NOTE — Interval H&P Note (Signed)
History and Physical Interval Note:  09/26/2017 12:33 PM  Brian Hunt  has presented today for surgery, with the diagnosis of RIGHT HIP OSTEOARTHRITIS  The various methods of treatment have been discussed with the patient and family. After consideration of risks, benefits and other options for treatment, the patient has consented to  Procedure(s): TOTAL HIP ARTHROPLASTY ANTERIOR APPROACH (Right) as a surgical intervention .  The patient's history has been reviewed, patient examined, no change in status, stable for surgery.  I have reviewed the patient's chart and labs.  Questions were answered to the patient's satisfaction.     Kerin Salen

## 2017-09-26 NOTE — Evaluation (Signed)
Physical Therapy Evaluation Patient Details Name: Brian Hunt MRN: [id number] DOB: [birth year] Today's Date: 09/26/2017   History of Present Illness  Pt is a 72 y/o male s/p elective R THA, direct anterior approach. PMH includes COPD, HTN, skin cancer, L hip bursitis, and s/p L THA.   Clinical Impression  Pt s/p surgery above with deficits below. PTA, pt was independent with functional mobility. Upon eval, pt presenting with post op pain and weakness and slightly decreased balance. Required min to min guard assist for mobility this session. Reports wife will be able to assist as needed and has all necessary DME. Follow up PT per MD arrangements. Will continue to follow acutely to maximize functional mobility independence and safety.     Follow Up Recommendations DC plan and follow up therapy as arranged by surgeon;Supervision for mobility/OOB    Equipment Recommendations  None recommended by PT    Recommendations for Other Services       Precautions / Restrictions Precautions Precautions: None Precaution Comments: Reviewed supine ther ex with pt.  Restrictions Weight Bearing Restrictions: Yes RLE Weight Bearing: Weight bearing as tolerated      Mobility  Bed Mobility Overal bed mobility: Needs Assistance Bed Mobility: Supine to Sit     Supine to sit: Supervision     General bed mobility comments: Supervision for safety.   Transfers Overall transfer level: Needs assistance Equipment used: Rolling walker (2 wheeled) Transfers: Sit to/from Stand Sit to Stand: Min assist         General transfer comment: Min A for lift assist and steadying assist. Increased time and effort required to stand.   Ambulation/Gait Ambulation/Gait assistance: Min guard Ambulation Distance (Feet): 20 Feet Assistive device: Rolling walker (2 wheeled) Gait Pattern/deviations: Step-to pattern;Step-through pattern;Decreased step length - right;Decreased step length - left;Decreased weight  shift to right;Antalgic Gait velocity: Decreased Gait velocity interpretation: Below normal speed for age/gender General Gait Details: Slow, antalgic gait. Initially unsteady, however, improved steadiness with increased distance. Verbal cues for sequencing using RW. Verbal cues for upright posture. Able to progress to step through gait, however, continues to demonstrate decreased weightshift to the R.  Stairs            Wheelchair Mobility    Modified Rankin (Stroke Patients Only)       Balance Overall balance assessment: Needs assistance Sitting-balance support: No upper extremity supported;Feet supported Sitting balance-Leahy Scale: Good     Standing balance support: Bilateral upper extremity supported;During functional activity Standing balance-Leahy Scale: Poor Standing balance comment: Reliant on BUE support.                              Pertinent Vitals/Pain Pain Assessment: 0-10 Pain Score: 5  Pain Location: R hip  Pain Descriptors / Indicators: Aching;Operative site guarding Pain Intervention(s): Limited activity within patient's tolerance;Monitored during session;Repositioned;RN gave pain meds during session    [place] expects to be discharged to:: Private residence Living Arrangements: Spouse/significant other Available Help at Discharge: Family;Available 24 hours/day Type of Home: House Home Access: Stairs to enter Entrance Stairs-Rails: Right Entrance Stairs-Number of Steps: 4 Home Layout: Two level;Able to live on main level with bedroom/bathroom Home Equipment: Gilford Rile - 2 wheels;Bedside commode;Shower seat;Cane - single point      Prior Function Level of Independence: Independent               Hand Dominance   Dominant Hand: Right  Extremity/Trunk Assessment   Upper Extremity Assessment Upper Extremity Assessment: Overall WFL for tasks assessed    Lower Extremity Assessment Lower Extremity Assessment:  RLE deficits/detail RLE Deficits / Details: Numbness at the thigh. Deficits consistent with post op pain and weakness. Able to perform ther ex below.     Cervical / Trunk Assessment Cervical / Trunk Assessment: Normal  Communication   Communication: No difficulties  Cognition Arousal/Alertness: Awake/alert Behavior During Therapy: WFL for tasks assessed/performed Overall Cognitive Status: Within Functional Limits for tasks assessed                                        General Comments General comments (skin integrity, edema, etc.): Pt's wife present during session.     Exercises Total Joint Exercises Ankle Circles/Pumps: AROM;Both;20 reps Quad Sets: AROM;Right;10 reps Short Arc Quad: AROM;Right;10 reps Heel Slides: AROM;Right;10 reps   Assessment/Plan    PT Assessment Patient needs continued PT services  PT Problem List Decreased strength;Decreased range of motion;Decreased balance;Decreased mobility;Decreased knowledge of use of DME;Decreased knowledge of precautions;Pain       PT Treatment Interventions DME instruction;Gait training;Stair training;Functional mobility training;Therapeutic activities;Therapeutic exercise;Balance training;Neuromuscular re-education;Patient/family education    PT Goals (Current goals can be found in the Care Plan section)  Acute Rehab PT Goals Patient Stated Goal: to go home  PT Goal Formulation: With patient Time For Goal Achievement: 10/10/17 Potential to Achieve Goals: Good    Frequency 7X/week   Barriers to discharge        Co-evaluation               AM-PAC PT "6 Clicks" Daily Activity  Outcome Measure Difficulty turning over in bed (including adjusting bedclothes, sheets and blankets)?: A Little Difficulty moving from lying on back to sitting on the side of the bed? : A Little Difficulty sitting down on and standing up from a chair with arms (e.g., wheelchair, bedside commode, etc,.)?: Unable Help  needed moving to and from a bed to chair (including a wheelchair)?: A Little Help needed walking in hospital room?: A Little Help needed climbing 3-5 steps with a railing? : A Lot 6 Click Score: 15    End of Session Equipment Utilized During Treatment: Gait belt Activity Tolerance: Patient tolerated treatment well Patient left: in chair;with call bell/phone within reach;with nursing/sitter in room;with family/visitor present Nurse Communication: Mobility status PT Visit Diagnosis: Other abnormalities of gait and mobility (R26.89);Pain Pain - Right/Left: Right Pain - part of body: Hip    Time: 8938-1017 PT Time Calculation (min) (ACUTE ONLY): 29 min   Charges:   PT Evaluation $PT Eval Low Complexity: 1 Low PT Treatments $Gait Training: 8-22 mins   PT G Codes:        Leighton Ruff, PT, DPT  Acute Rehabilitation Services  Pager: [phone]   Rudean Hitt 09/26/2017, 5:58 PM

## 2017-09-26 NOTE — Op Note (Signed)
OPERATIVE REPORT    DATE OF PROCEDURE:  09/26/2017       PREOPERATIVE DIAGNOSIS:  RIGHT HIP OSTEOARTHRITIS                                                          POSTOPERATIVE DIAGNOSIS:  RIGHT HIP OSTEOARTHRITIS                                                           PROCEDURE: Anterior R total hip arthroplasty using a 50 mm DePuy Pinnacle  Cup, [employer], 0-degree polyethylene liner, a +1.5 x 32 mm ceramic head, a 5 hi Depuy Triloc stem   SURGEON: Kerin Salen    ASSISTANT:   Kerry Hough. [employer]  (present throughout entire procedure and necessary for timely completion of the procedure)   ANESTHESIA: Spinal BLOOD LOSS: 300cc FLUID REPLACEMENT: 1500cc crystalloid Antibiotic: 2gm ancef Tranexamic Acid: 1gm IV, 2gm Topical COMPLICATIONS: none    INDICATIONS FOR PROCEDURE: A 72 y.o. year-old With  RIGHT HIP OSTEOARTHRITIS   for 3 years, x-rays show bone-on-bone arthritic changes, and osteophytes. Despite conservative measures with observation, anti-inflammatory medicine, narcotics, use of a cane, has severe unremitting pain and can ambulate only a few blocks before resting. Patient desires elective R total hip arthroplasty to decrease pain and increase function. The risks, benefits, and alternatives were discussed at length including but not limited to the risks of infection, bleeding, nerve injury, stiffness, blood clots, the need for revision surgery, cardiopulmonary complications, among others, and they were willing to proceed. Questions answered     PROCEDURE IN DETAIL: The patient was identified by armband,  received preoperative IV antibiotics in the holding area at [facility], taken to the operating room , appropriate anesthetic monitors  were attached and  anesthesia was induced with the patienton the gurney. The HANA boots were applied to the feet and he was then transferred to the HANA table with a peroneal post and support underneath the  non-operative le, which was locked in 5 lb traction. Theoperative lower extremity was then prepped and draped in the usual sterile fashion from just above the iliac crest to the knee. And a timeout procedure was performed. We then made a 12 cm incision along the interval at the leading edge of the tensor fascia lata of starting at 2 cm lateral to and 2 cm distal to the ASIS. Small bleeders in the skin and subcutaneous tissue identified and cauterized we dissected down to the fascia and made an incision in the fascia allowing [place] to elevate the fascia of the tensor muscle and exploited the interval between the rectus and the tensor fascia lata. A Hohmann retractor was then placed along the superior neck of the femur and a Cobra retractor along the inferior neck of the femur we teed the capsule starting out at the superior anterior aspect of the acetabulum going distally and made the T along the neck both leaflets of the T were tagged with #2 Ethibond suture. Cobra retractors were then placed along the inferior and superior neck allowing [place] to perform a standard neck  cut and removed the femoral head with a power corkscrew. We then placed a right angle Hohmann retractor along the anterior aspect of the acetabulum a spiked Cobra in the cotyloid notch and posteriorly a Muelller retractor. We then sequentially reamed up to a 49 mm basket reamer obtaining good coverage in all quadrants, verified by C-arm imaging. Under C-arm control with and hammered into place a 50 mm Pinnacle cup in 45 of abduction and 15 of anteversion. The cup seated nicely and required no supplemental screws. We then placed a central hole Eliminator and a 0 polyethylene liner. The foot was then externally rotated to 110, the HANA elevator was placed around the flare of the greater trochanter and the limb was extended and abducted delivering the proximal femur up into the wound. A medium Hohmann retractor was placed over the greater trochanter and a  Mueller retractor along the posterior femoral neck completing the exposure. We then performed releases superiorly and and inferiorly of the capsule going back to the pirformis fossa superiorly and to the lesser trochanter inferiorly. We then entered the proximal femur with the box cutting offset chisel followed by, a canal sounder, the chili pepper and broaching up to a 5 broach. This seated nicely and we reamed the calcar. A trial reduction was performed with a 1.5 mm 32 mm head.The limb lengths were excellent the hip was stable in 90 of external rotation. At this point the trial components removed and we hammered into place a # 5 hi  Offset Tri-Lock stem with Gryption coating. A + 1.5 32 mm ceramic ball was then hammered into place the hip was reduced and final C-arm images obtained. The wound was thoroughly irrigated with normal saline solution. We repaired the ant capsule and the tensor fascia lot a with running 0 vicryl suture. the subcutaneous tissue was closed with 2-0 and 3-0 Vicryl suture followed by an Aquacil dressing. At this point the patient was awaken and transferred to hospital gurney without difficulty. The subcutaneous tissue with 0 and 2-0 undyed Vicryl suture and the skin with running  3-0 vicryl subcuticular suture. Aquacil dressing was applied. The patient was then unclamped, rolled supine, awaken extubated and taken to recovery room without difficulty in stable condition.   Kerin Salen 09/26/2017, 2:33 PM

## 2017-09-26 NOTE — Transfer of Care (Signed)
Immediate Anesthesia Transfer of Care Note  Patient: Brian Hunt  Procedure(s) Performed: TOTAL HIP ARTHROPLASTY ANTERIOR APPROACH (Right )  Patient Location: PACU  Anesthesia Type:Spinal  Level of Consciousness: awake, oriented and patient cooperative  Airway & Oxygen Therapy: Patient Spontanous Breathing  Post-op Assessment: Report given to RN, Post -op Vital signs reviewed and stable and Patient moving all extremities  Post vital signs: Reviewed and stable  Last Vitals:  Vitals:   09/26/17 1102 09/26/17 1512  BP: 132/68 134/77  Pulse: 64 74  Resp: 18 14  Temp: 36.8 C (!) 36.4 C  SpO2: 98% 97%    Last Pain:  Vitals:   09/26/17 1102  TempSrc: Oral      Patients Stated Pain Goal: 2 (94/32/00 3794)  Complications: No apparent anesthesia complications

## 2017-09-26 NOTE — Discharge Instructions (Signed)

## 2017-09-26 NOTE — Anesthesia Procedure Notes (Signed)
Spinal  Patient location during procedure: OR Start time: 09/26/2017 1:20 PM End time: 09/26/2017 1:30 PM Staffing Anesthesiologist: Murvin Natal, MD Performed: anesthesiologist  Preanesthetic Checklist Completed: patient identified, surgical consent, pre-op evaluation, timeout performed, IV checked, risks and benefits discussed and monitors and equipment checked Spinal Block Patient position: sitting Prep: DuraPrep Patient monitoring: cardiac monitor, continuous pulse ox and blood pressure Approach: midline Location: L3-4 Injection technique: single-shot Needle Needle type: Pencan  Needle gauge: 24 G Needle length: 9 cm Assessment Sensory level: T10 Additional Notes Functioning IV was confirmed and monitors were applied. Sterile prep and drape, including hand hygiene and sterile gloves were used. The patient was positioned and the spine was prepped. The skin was anesthetized with lidocaine.  Free flow of clear CSF was obtained prior to injecting local anesthetic into the CSF.  The spinal needle aspirated freely following injection.  The needle was carefully withdrawn.  The patient tolerated the procedure well.

## 2017-09-26 NOTE — Anesthesia Postprocedure Evaluation (Signed)
Anesthesia Post Note  Patient: BEREN YNIGUEZ  Procedure(s) Performed: TOTAL HIP ARTHROPLASTY ANTERIOR APPROACH (Right )     Patient location during evaluation: PACU Anesthesia Type: Spinal Level of consciousness: oriented and awake and alert Pain management: pain level controlled Vital Signs Assessment: post-procedure vital signs reviewed and stable Respiratory status: spontaneous breathing, respiratory function stable and patient connected to nasal cannula oxygen Cardiovascular status: blood pressure returned to baseline and stable Postop Assessment: no headache, no backache, no apparent nausea or vomiting, patient able to bend at knees and spinal receding Anesthetic complications: no    Last Vitals:  Vitals:   09/26/17 1628 09/26/17 1642  BP: 131/74 (!) 146/71  Pulse: (!) 54 (!) 53  Resp: 13 16  Temp: 36.6 C (!) 36.4 C  SpO2: 99% 97%    Last Pain:  Vitals:   09/26/17 1642  TempSrc: Oral  PainSc:                  Effie Berkshire

## 2017-09-27 ENCOUNTER — Encounter (HOSPITAL_COMMUNITY): Payer: Self-pay | Admitting: Orthopedic Surgery

## 2017-09-27 LAB — CBC
HEMATOCRIT: 33.9 % — AB (ref 39.0–52.0)
Hemoglobin: 11 g/dL — ABNORMAL LOW (ref 13.0–17.0)
MCH: 31 pg (ref 26.0–34.0)
MCHC: 32.4 g/dL (ref 30.0–36.0)
MCV: 95.5 fL (ref 78.0–100.0)
PLATELETS: 271 10*3/uL (ref 150–400)
RBC: 3.55 MIL/uL — ABNORMAL LOW (ref 4.22–5.81)
RDW: 13.5 % (ref 11.5–15.5)
WBC: 10.2 10*3/uL (ref 4.0–10.5)

## 2017-09-27 NOTE — Progress Notes (Signed)
Physical Therapy Treatment Patient Details Name: Brian Hunt MRN: [id number] DOB: December 03, 1945 Today's Date: 09/27/2017    History of Present Illness Pt is a 72 y/o male s/p elective R THA, direct anterior approach. PMH includes COPD, HTN, skin cancer, L hip bursitis, and s/p L THA.     PT Comments    Patient is making good progress with PT.  From a mobility standpoint anticipate patient will be ready for DC home when medically ready.    Follow Up Recommendations  DC plan and follow up therapy as arranged by surgeon;Supervision for mobility/OOB     Equipment Recommendations  None recommended by PT    Recommendations for Other Services       Precautions / Restrictions Precautions Precautions: None Restrictions Weight Bearing Restrictions: Yes RLE Weight Bearing: Weight bearing as tolerated    Mobility  Bed Mobility Overal bed mobility: Modified Independent Bed Mobility: Supine to Sit              Transfers Overall transfer level: Needs assistance Equipment used: Rolling walker (2 wheeled) Transfers: Sit to/from Stand Sit to Stand: Min guard         General transfer comment: min guard for safety; safe hand placement demonstrated  Ambulation/Gait Ambulation/Gait assistance: Supervision Ambulation Distance (Feet): 300 Feet Assistive device: Rolling walker (2 wheeled) Gait Pattern/deviations: Step-through pattern Gait velocity: Decreased   General Gait Details: steady gait with little reliance on UE support   Stairs Stairs: Yes   Stair Management: One rail Right;Step to pattern;Forwards Number of Stairs: 6 General stair comments: cues for sequencing and technique  Wheelchair Mobility    Modified Rankin (Stroke Patients Only)       Balance Overall balance assessment: Needs assistance Sitting-balance support: No upper extremity supported;Feet supported Sitting balance-Leahy Scale: Good     Standing balance support: Bilateral upper extremity  supported;During functional activity Standing balance-Leahy Scale: Poor Standing balance comment: Reliant on BUE support.                             Cognition Arousal/Alertness: Awake/alert Behavior During Therapy: WFL for tasks assessed/performed Overall Cognitive Status: Within Functional Limits for tasks assessed                                        Exercises      General Comments General comments (skin integrity, edema, etc.): pt has HEP handout and frequency reviewed      Pertinent Vitals/Pain Pain Assessment: 0-10 Pain Score: 3  Pain Location: R hip  Pain Descriptors / Indicators: Sore;Discomfort Pain Intervention(s): Monitored during session;Repositioned;Ice applied    Home Living                      Prior Function            PT Goals (current goals can now be found in the care plan section) Acute Rehab PT Goals Patient Stated Goal: to go home  PT Goal Formulation: With patient Time For Goal Achievement: 10/10/17 Potential to Achieve Goals: Good Progress towards PT goals: Progressing toward goals    Frequency    7X/week      PT Plan Current plan remains appropriate    Co-evaluation              AM-PAC PT "6 Clicks" Daily Activity  Outcome Measure  Difficulty turning over in bed (including adjusting bedclothes, sheets and blankets)?: A Little Difficulty moving from lying on back to sitting on the side of the bed? : A Little Difficulty sitting down on and standing up from a chair with arms (e.g., wheelchair, bedside commode, etc,.)?: A Lot Help needed moving to and from a bed to chair (including a wheelchair)?: A Little Help needed walking in hospital room?: A Little Help needed climbing 3-5 steps with a railing? : A Little 6 Click Score: 17    End of Session Equipment Utilized During Treatment: Gait belt Activity Tolerance: Patient tolerated treatment well Patient left: in chair;with call bell/phone  within reach Nurse Communication: Mobility status PT Visit Diagnosis: Other abnormalities of gait and mobility (R26.89);Pain Pain - Right/Left: Right Pain - part of body: Hip     Time: 1914-7829 PT Time Calculation (min) (ACUTE ONLY): 26 min  Charges:  $Gait Training: 23-37 mins                    G Codes:       Earney Navy, PTA Pager: [phone]     Darliss Cheney 09/27/2017, 10:53 AM

## 2017-09-27 NOTE — Plan of Care (Signed)
  Adequate for Discharge Health Behavior/Discharge Planning: Ability to manage health-related needs will improve 09/27/2017 1332 - Adequate for Discharge by Rance Muir, RN Clinical Measurements: Ability to maintain clinical measurements within normal limits will improve 09/27/2017 1332 - Adequate for Discharge by Rance Muir, RN Will remain free from infection 09/27/2017 1332 - Adequate for Discharge by Rance Muir, RN Diagnostic test results will improve 09/27/2017 1332 - Adequate for Discharge by Rance Muir, RN Respiratory complications will improve 09/27/2017 1332 - Adequate for Discharge by Rance Muir, RN Cardiovascular complication will be avoided 09/27/2017 1332 - Adequate for Discharge by Rance Muir, RN Activity: Risk for activity intolerance will decrease 09/27/2017 1332 - Adequate for Discharge by Rance Muir, RN Nutrition: Adequate nutrition will be maintained 09/27/2017 1332 - Adequate for Discharge by Rance Muir, RN Coping: Level of anxiety will decrease 09/27/2017 1332 - Adequate for Discharge by Rance Muir, RN Elimination: Will not experience complications related to bowel motility 09/27/2017 1332 - Adequate for Discharge by Rance Muir, RN Will not experience complications related to urinary retention 09/27/2017 1332 - Adequate for Discharge by Rance Muir, RN Pain Managment: General experience of comfort will improve 09/27/2017 1332 - Adequate for Discharge by Rance Muir, RN Safety: Ability to remain free from injury will improve 09/27/2017 1332 - Adequate for Discharge by Rance Muir, RN Skin Integrity: Risk for impaired skin integrity will decrease 09/27/2017 1332 - Adequate for Discharge by Rance Muir, RN Education: Knowledge of the prescribed therapeutic regimen will improve 09/27/2017 1332 - Adequate for Discharge by Rance Muir, RN Understanding of discharge needs will improve 09/27/2017 1332 - Adequate for Discharge by Rance Muir, RN Activity: Ability to avoid complications of mobility  impairment will improve 09/27/2017 1332 - Adequate for Discharge by Rance Muir, RN Ability to tolerate increased activity will improve 09/27/2017 1332 - Adequate for Discharge by Rance Muir, RN Clinical Measurements: Postoperative complications will be avoided or minimized 09/27/2017 1332 - Adequate for Discharge by Rance Muir, RN Pain Management: Pain level will decrease with appropriate interventions 09/27/2017 1332 - Adequate for Discharge by Rance Muir, RN Skin Integrity: Signs of wound healing will improve 09/27/2017 1332 - Adequate for Discharge by Rance Muir, RN

## 2017-09-27 NOTE — Discharge Summary (Signed)
Patient ID: Brian Hunt MRN: [id number] DOB/AGE: [birth year] 72 y.o.  Admit date: 09/26/2017 Discharge date: 09/27/2017  Admission Diagnoses:  Principal Problem:   Osteoarthritis of right hip Active Problems:   Primary osteoarthritis of right hip   Discharge Diagnoses:  Same  Past Medical History:  Diagnosis Date  . Anemia   . Anxiety   . Arthritis   . Basal cell carcinoma    Basal Cell X2 , Dr Radford Pax  . BPH (benign prostatic hyperplasia)   . Bursitis of left hip    Dr. Ihor Gully  . Carotid artery occlusion   . Complication of anesthesia    pt. states he had vasal vagal reaction after last surgery,after he got to his room  . COPD (chronic obstructive pulmonary disease) ([place])   . Diverticulosis   . Dyspnea    with exertion  . Elevated PSA   . Hyperlipidemia   . Hypertension   . Internal hemorrhoids   . Prostatitis    Dr Terance Hart  . RBBB     Surgeries: Procedure(s): TOTAL HIP ARTHROPLASTY ANTERIOR APPROACH on 09/26/2017   Consultants:   Discharged Condition: Improved  Hospital Course: Brian Hunt is an 72 y.o. male who was admitted 09/26/2017 for operative treatment ofOsteoarthritis of right hip. Patient has severe unremitting pain that affects sleep, daily activities, and work/hobbies. After pre-op clearance the patient was taken to the operating room on 09/26/2017 and underwent  Procedure(s): TOTAL HIP ARTHROPLASTY ANTERIOR APPROACH.    Patient was given perioperative antibiotics:  Anti-infectives (From admission, onward)   Start     Dose/Rate Route Frequency Ordered Stop   09/26/17 1102  ceFAZolin (ANCEF) 2-4 GM/100ML-% IVPB    Comments:  Nyoka Cowden   : cabinet override      09/26/17 1102 09/26/17 1312   09/26/17 1057  ceFAZolin (ANCEF) IVPB 2g/100 mL premix     2 g 200 mL/hr over 30 Minutes Intravenous On call to O.R. 09/26/17 1057 09/26/17 1317       Patient was given sequential compression devices, early ambulation, and chemoprophylaxis to prevent  DVT.  Patient benefited maximally from hospital stay and there were no complications.    Recent vital signs:  Patient Vitals for the past 24 hrs:  BP Temp Temp src Pulse Resp SpO2  09/27/17 0500 113/69 97.9 F (36.6 C) Oral (!) 58 16 98 %  09/26/17 2215 119/64 98.4 F (36.9 C) Oral (!) 58 17 95 %  09/26/17 1642 (!) 146/71 (!) 97.5 F (36.4 C) Oral (!) 53 16 97 %  09/26/17 1628 131/74 97.8 F (36.6 C) - (!) 54 13 99 %  09/26/17 1613 (!) 157/80 97.8 F (36.6 C) - (!) 56 15 97 %  09/26/17 1558 (!) 166/91 - - (!) 52 13 100 %  09/26/17 1542 (!) 150/77 - - (!) 56 17 98 %  09/26/17 1527 (!) 152/79 - - (!) 56 13 96 %  09/26/17 1512 134/77 (!) 97.5 F (36.4 C) - 74 14 97 %     Recent laboratory studies:  Recent Labs    09/27/17 1048  WBC 10.2  HGB 11.0*  HCT 33.9*  PLT 271     Discharge Medications:   Allergies as of 09/27/2017      Reactions   Lisinopril Cough   Oxycodone    Mental confusion      Medication List    STOP taking these medications   ibuprofen 200 MG tablet Commonly known as:  ADVIL,MOTRIN  TAKE these medications   amLODipine 5 MG tablet Commonly known as:  NORVASC TAKE 1 TABLET BY MOUTH EVERY DAY   aspirin EC 325 MG tablet Take 1 tablet (325 mg total) by mouth 2 (two) times daily. What changed:    medication strength  how much to take  when to take this   cyanocobalamin 2000 MCG tablet Take 1 tablet (2,000 mcg total) by mouth daily. What changed:  when to take this   HYDROmorphone 2 MG tablet Commonly known as:  DILAUDID Take 1 tablet (2 mg total) by mouth every 4 (four) hours as needed for severe pain.   loratadine 10 MG tablet Commonly known as:  CLARITIN Take 10 mg by mouth daily as needed for allergies.   pravastatin 20 MG tablet Commonly known as:  PRAVACHOL TAKE 1 TABLET BY MOUTH DAILY   PRESERVISION AREDS 2 PO Take 1 tablet by mouth daily.   telmisartan 40 MG tablet Commonly known as:  MICARDIS TAKE 1 TABLET(40 MG)  BY MOUTH DAILY   tiZANidine 2 MG tablet Commonly known as:  ZANAFLEX Take 1 tablet (2 mg total) by mouth every 6 (six) hours as needed for muscle spasms.   Turmeric Curcumin 500 MG Caps Take 500 mg by mouth daily.            Durable Medical Equipment  (From admission, onward)        Start     Ordered   09/26/17 1658  DME Walker rolling  Once    Question:  Patient needs a walker to treat with the following condition  Answer:  Status post right hip replacement   09/26/17 1657   09/26/17 1658  DME 3 n 1  Once     09/26/17 1657   09/26/17 1658  DME Bedside commode  Once    Question:  Patient needs a bedside commode to treat with the following condition  Answer:  Status post right hip replacement   09/26/17 1657      Diagnostic Studies: Dg Chest 2 View  Result Date: 09/24/2017 CLINICAL DATA:  Preop exam (total hip replacement scheduled for 09/26/17)Hx of COPD, HTNCurrent smoker x40 years EXAM: CHEST  2 VIEW COMPARISON:  01/13/2016 FINDINGS: The cardiac silhouette is normal in size and configuration. No mediastinal or hilar masses. No evidence of adenopathy. Clear lungs.  No pleural effusion or pneumothorax. Skeletal structures are intact. IMPRESSION: No active cardiopulmonary disease. Electronically Signed   By: Lajean Manes M.D.   On: 09/24/2017 12:00   Dg C-arm 1-60 Min  Result Date: 09/26/2017 CLINICAL DATA:  Right hip arthroplasty. EXAM: OPERATIVE RIGHT HIP (WITH PELVIS IF PERFORMED) 2 VIEWS TECHNIQUE: Fluoroscopic spot image(s) were submitted for interpretation post-operatively. COMPARISON:  None. FINDINGS: Two intraoperative views of the pelvis/right hip demonstrates right total hip arthroplasty hardware in changes. No definite complicating features noted. Portions of the left hip arthroplasty are present. IMPRESSION: Right total hip arthroplasty without complicating features identified. Electronically Signed   By: Margarette Canada M.D.   On: 09/26/2017 15:01   Dg Hip Operative Unilat  With Pelvis Right  Result Date: 09/26/2017 CLINICAL DATA:  Right hip arthroplasty. EXAM: OPERATIVE RIGHT HIP (WITH PELVIS IF PERFORMED) 2 VIEWS TECHNIQUE: Fluoroscopic spot image(s) were submitted for interpretation post-operatively. COMPARISON:  None. FINDINGS: Two intraoperative views of the pelvis/right hip demonstrates right total hip arthroplasty hardware in changes. No definite complicating features noted. Portions of the left hip arthroplasty are present. IMPRESSION: Right total hip arthroplasty without complicating features identified.  Electronically Signed   By: Margarette Canada M.D.   On: 09/26/2017 15:01    Disposition: 01-Home or Self Care  Discharge Instructions    Call MD / Call 911   Complete by:  As directed    If you experience chest pain or shortness of breath, CALL 911 and be transported to the hospital emergency room.  If you develope a fever above 101 F, pus (white drainage) or increased drainage or redness at the wound, or calf pain, call your surgeon's office.   Constipation Prevention   Complete by:  As directed    Drink plenty of fluids.  Prune juice may be helpful.  You may use a stool softener, such as Colace (over the counter) 100 mg twice a day.  Use MiraLax (over the counter) for constipation as needed.   Diet - low sodium heart healthy   Complete by:  As directed    Driving restrictions   Complete by:  As directed    No driving for 2 weeks   Follow the hip precautions as taught in Physical Therapy   Complete by:  As directed    Increase activity slowly as tolerated   Complete by:  As directed    Patient may shower   Complete by:  As directed    You may shower without a dressing once there is no drainage.  Do not wash over the wound.  If drainage remains, cover wound with plastic wrap and then shower.      Follow-up Information    Frederik Pear, MD Follow up in 2 week(s).   Specialty:  Orthopedic Surgery Contact information: [place]   [id number] [phone]            Signed: Joanell Rising 09/27/2017, 12:50 PM

## 2017-09-27 NOTE — Progress Notes (Signed)
PATIENT ID: Brian Hunt  MRN: [id number]  DOB/AGE:  04/12/1946 / 72 y.o.  1 Day Post-Op Procedure(s) (LRB): TOTAL HIP ARTHROPLASTY ANTERIOR APPROACH (Right)    PROGRESS NOTE Subjective: Patient is alert, oriented, no Nausea, no Vomiting, yes passing gas, . Taking PO well. Denies SOB, Chest or Calf Pain. Using Incentive Spirometer, PAS in place. Ambulate WBAT walked in room Patient reports pain as  1/10  .    Objective: Vital signs in last 24 hours: Vitals:   09/26/17 1628 09/26/17 1642 09/26/17 2215 09/27/17 0500  BP: 131/74 (!) 146/71 119/64 113/69  Pulse: (!) 54 (!) 53 (!) 58 (!) 58  Resp: 13 16 17 16   Temp: 97.8 F (36.6 C) (!) 97.5 F (36.4 C) 98.4 F (36.9 C) 97.9 F (36.6 C)  TempSrc:  Oral Oral Oral  SpO2: 99% 97% 95% 98%  Weight:      Height:          Intake/Output from previous day: I/O last 3 completed shifts: In: 1663.5 [I.V.:1453.5; IV Piggyback:210] Out: 900 [Urine:650; Blood:250]   Intake/Output this shift: No intake/output data recorded.   LABORATORY DATA: No results for input(s): WBC, HGB, HCT, PLT, NA, K, CL, CO2, BUN, CREATININE, GLUCOSE, GLUCAP, INR, CALCIUM in the last 72 hours.  Invalid input(s): PT, 2  Examination: Neurologically intact ABD soft Neurovascular intact Sensation intact distally Intact pulses distally Dorsiflexion/Plantar flexion intact Incision: dressing C/D/I No cellulitis present Compartment soft} XR AP&Lat of hip shows well placed\fixed THA  Assessment:   1 Day Post-Op Procedure(s) (LRB): TOTAL HIP ARTHROPLASTY ANTERIOR APPROACH (Right) ADDITIONAL DIAGNOSIS:  Expected Acute Blood Loss Anemia, Hypertension  Plan: PT/OT WBAT, THA  DVT Prophylaxis: SCDx72 hrs, ASA 325 mg BID x 2 weeks  DISCHARGE PLAN: Home, prob today  DISCHARGE NEEDS: HHPT, Walker and 3-in-1 comode seat

## 2017-09-28 ENCOUNTER — Telehealth: Payer: Self-pay | Admitting: *Deleted

## 2017-09-28 NOTE — Telephone Encounter (Signed)
Pt was on the TCM list admitted for Primary osteoarthritis of right hip on 09/26/17. Pt had TOTAL HIP ARTHROPLASTY ANTERIOR, and was D/C 09/27/17. He will be following up w/ Dr. Mayer Camel in 2 weeks...Brian Hunt

## 2017-10-09 ENCOUNTER — Emergency Department (HOSPITAL_COMMUNITY)
Admission: EM | Admit: 2017-10-09 | Discharge: 2017-10-09 | Disposition: A | Discharge status: Home / Self Care | Payer: Medicare Other | Attending: Emergency Medicine | Admitting: Emergency Medicine

## 2017-10-09 ENCOUNTER — Encounter (HOSPITAL_COMMUNITY): Payer: Self-pay | Admitting: Emergency Medicine

## 2017-10-09 ENCOUNTER — Emergency Department (HOSPITAL_COMMUNITY): Payer: Medicare Other

## 2017-10-09 DIAGNOSIS — Y939 Activity, unspecified: Secondary | ICD-10-CM | POA: Diagnosis not present

## 2017-10-09 DIAGNOSIS — Z7982 Long term (current) use of aspirin: Secondary | ICD-10-CM | POA: Insufficient documentation

## 2017-10-09 DIAGNOSIS — I10 Essential (primary) hypertension: Secondary | ICD-10-CM | POA: Insufficient documentation

## 2017-10-09 DIAGNOSIS — Z96643 Presence of artificial hip joint, bilateral: Secondary | ICD-10-CM | POA: Diagnosis not present

## 2017-10-09 DIAGNOSIS — X500XXA Overexertion from strenuous movement or load, initial encounter: Secondary | ICD-10-CM | POA: Diagnosis not present

## 2017-10-09 DIAGNOSIS — Y999 Unspecified external cause status: Secondary | ICD-10-CM | POA: Insufficient documentation

## 2017-10-09 DIAGNOSIS — Y92009 Unspecified place in unspecified non-institutional (private) residence as the place of occurrence of the external cause: Secondary | ICD-10-CM | POA: Insufficient documentation

## 2017-10-09 DIAGNOSIS — S79911A Unspecified injury of right hip, initial encounter: Secondary | ICD-10-CM | POA: Diagnosis present

## 2017-10-09 DIAGNOSIS — J449 Chronic obstructive pulmonary disease, unspecified: Secondary | ICD-10-CM | POA: Diagnosis not present

## 2017-10-09 DIAGNOSIS — Z87891 Personal history of nicotine dependence: Secondary | ICD-10-CM | POA: Diagnosis not present

## 2017-10-09 DIAGNOSIS — Z79899 Other long term (current) drug therapy: Secondary | ICD-10-CM | POA: Diagnosis not present

## 2017-10-09 DIAGNOSIS — S73004A Unspecified dislocation of right hip, initial encounter: Secondary | ICD-10-CM | POA: Insufficient documentation

## 2017-10-09 MED ORDER — PROPOFOL 10 MG/ML IV BOLUS
1.0000 mg/kg | Freq: Once | INTRAVENOUS | Status: AC
  Administered 2017-10-09: 50 mg via INTRAVENOUS
  Filled 2017-10-09: qty 20

## 2017-10-09 MED ORDER — MORPHINE SULFATE (PF) 4 MG/ML IV SOLN
4.0000 mg | Freq: Once | INTRAVENOUS | Status: AC | PRN
  Administered 2017-10-09: 4 mg via INTRAVENOUS
  Filled 2017-10-09: qty 1

## 2017-10-09 MED ORDER — HYDROMORPHONE HCL 1 MG/ML IJ SOLN
1.0000 mg | Freq: Once | INTRAMUSCULAR | Status: AC
  Administered 2017-10-09: 1 mg via INTRAVENOUS
  Filled 2017-10-09: qty 1

## 2017-10-09 NOTE — ED Provider Notes (Signed)
Medical screening examination/treatment/procedure(s) were conducted as a shared visit with non-physician practitioner(s) and myself.  I personally evaluated the patient during the encounter.   EKG Interpretation None      Patient presents with right hip dislocation.  He rolled over in bed.  Recent hip surgery with prosthetic hip in place.  This was discussed with the on-call orthopedic doc, Dr. Lynann Bologna.  He recommends sedation and attempt at reduction.  He has a follow-up appointment later this morning with Dr. Mayer Camel.    Physical Exam  BP 129/72 (BP Location: Left Arm)   Pulse 66   Temp 97.6 F (36.4 C) (Temporal)   Resp 16   Ht 5\' 7"  (1.702 m)   Wt 71.2 kg (157 lb)   SpO2 (!) 22%   BMI 24.59 kg/m   Physical Exam  Airway patent Lungs clear Patient with foreshortened right lower extremity, neurovascularly intact with 2+ DP pulse  ED Course/Procedures     .Sedation Date/Time: 10/09/2017 4:28 AM Performed by: Merryl Hacker, MD Authorized by: Merryl Hacker, MD   Consent:    Consent obtained:  Written   Consent given by:  Patient   Risks discussed:  Prolonged hypoxia resulting in organ damage and respiratory compromise necessitating ventilatory assistance and intubation Universal protocol:    Immediately prior to procedure a time out was called: yes   Indications:    Procedure performed:  Dislocation reduction   Procedure necessitating sedation performed by:  Physician performing sedation (and assisting PA, Quincy Carnes)   Intended level of sedation:  Moderate (conscious sedation) Pre-sedation assessment:    Time since last food or drink:  1800   ASA classification: class 2 - patient with mild systemic disease     Neck mobility: normal     Mouth opening:  3 or more finger widths   Thyromental distance:  2 finger widths   Mallampati score:  II - soft palate, uvula, fauces visible   Pre-sedation assessments completed and reviewed: airway patency not reviewed,  cardiovascular function not reviewed and hydration status not reviewed     Pre-sedation assessment completed:  10/09/2017 4:30 AM Immediate pre-procedure details:    Reassessment: Patient reassessed immediately prior to procedure     Reviewed: vital signs, relevant labs/tests and NPO status     Verified: emergency equipment available, IV patency confirmed and oxygen available   Procedure details (see MAR for exact dosages):    Preoxygenation:  Nasal cannula   Sedation:  Propofol   Intra-procedure monitoring:  Blood pressure monitoring, continuous capnometry, frequent LOC assessments, frequent vital sign checks, continuous pulse oximetry and cardiac monitor   Intra-procedure events: none     Total Provider sedation time (minutes):  20 Post-procedure details:    Post-sedation assessment completed:  10/09/2017 4:52 AM   Attendance: Constant attendance by certified staff until patient recovered     Recovery: Patient returned to pre-procedure baseline     Patient is stable for discharge or admission: yes     Patient tolerance:  Tolerated well, no immediate complications      MDM   Patient tolerated reduction well.  Postreduction films were reviewed and show relocation the right hip.  Patient feels much better and is ambulatory.  Follow-up with Dr. Mayer Camel as scheduled.       Merryl Hacker, MD 10/09/17 [phone]

## 2017-10-09 NOTE — Discharge Instructions (Signed)
Follow-up with Dr. Mayer Camel this morning as scheduled.  Take your paperwork to his office and let him review. Be very careful when moving around, go slowly with subtle movements to help prevent recurrent dislocation. Return here for any new or acute changes.

## 2017-10-09 NOTE — ED Triage Notes (Signed)
Pt presents from home with [facility] for R hip pain after rolling/changing positions in bed; pt then attempted to bear weight to the hip and could not d/t pain; positive pulses distally; EMS gave 100 mcg fentanyl PTA

## 2017-10-09 NOTE — ED Notes (Signed)
Re paged ortho  

## 2017-10-09 NOTE — ED Notes (Signed)
Patient transported to X-ray 

## 2017-10-09 NOTE — Progress Notes (Signed)
RRT at bedside for conscious sedation for R hip dislocation. At bedside throughout the procedure, no complications noted, ETCO2 was normal, SATs are stable. Pt is now alert and oriented. Pelvic XR pending. PA at bedside

## 2017-10-09 NOTE — ED Provider Notes (Signed)
[facility] EMERGENCY DEPARTMENT Provider Note   CSN: [id number] Arrival date & time: 10/09/17  0151     History   Chief Complaint Chief Complaint  Patient presents with  . Hip Pain    HPI Brian Hunt is a 72 y.o. male.  The history is provided by the patient and medical records.  Hip Pain     72 y.o. M with hx of anemia, anxiety, arthritis, COPD, HLP, HTN, recent THA right hip on 09/26/17 with Dr. Mayer Camel, presenting to the ED for right hip pain.  Patient states his surgery went well, no complications, and has been recovering well at home and barely using his cane now.  States he was in bed tonight and rolled over and felt a "pop" in his right hip.  States it feels like "it is out of joint".  No hx of same.  Denies numbness/weakness of right leg but states unable to bear weight after this incident.  Last PO intake 6pm last evening.  Reports he actually has appt with Dr. Mayer Camel this morning at 8:45am.  Past Medical History:  Diagnosis Date  . Anemia   . Anxiety   . Arthritis   . Basal cell carcinoma    Basal Cell X2 , Dr Radford Pax  . BPH (benign prostatic hyperplasia)   . Bursitis of left hip    Dr. Ihor Gully  . Carotid artery occlusion   . Complication of anesthesia    pt. states he had vasal vagal reaction after last surgery,after he got to his room  . COPD (chronic obstructive pulmonary disease) ([place])   . Diverticulosis   . Dyspnea    with exertion  . Elevated PSA   . Hyperlipidemia   . Hypertension   . Internal hemorrhoids   . Prostatitis    Dr Terance Hart  . RBBB     Patient Active Problem List   Diagnosis Date Noted  . Primary osteoarthritis of right hip 09/26/2017  . Osteoarthritis of right hip 09/25/2017  . B12 deficiency 02/08/2017  . BPH (benign prostatic hyperplasia) 06/14/2016  . Routine general medical examination at a health care facility 01/13/2016  . Screening for colon cancer 01/13/2016  . Simple chronic bronchitis ([place]) 01/13/2016  .  Occlusion and stenosis of carotid artery without mention of cerebral infarction 05/01/2014  . Basal cell carcinoma 02/26/2014  . 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 Hyponatremia 07/04/2013  . Ex-smoker 07/04/2013  . HTN (hypertension) 12/25/2012  . Hyperlipidemia with target LDL less than 70 12/25/2012  . Macular degeneration of both eyes 12/25/2012  . Carotid artery stenosis 03/17/2011    Past Surgical History:  Procedure Laterality Date  . COLONOSCOPY  2010   negative; [facility]  . CYSTOSCOPY  10/2014   [facility]; neg  . GREEN LIGHT LASER TURP (TRANSURETHRAL RESECTION OF PROSTATE  09/27/2012   Procedure: GREEN LIGHT LASER TURP (TRANSURETHRAL RESECTION OF PROSTATE;  Surgeon: Fredricka Bonine, MD;  Location: [facility];  Service: Urology;  Laterality: N/A;     . JOINT REPLACEMENT Left Oct. 12, 2012   left total hip replacement by Dr. Mayer Camel  . PROSTATE BIOPSY  2007 , 2009   X2 , Dr Thomasene Mohair  & Dr Terance Hart  . PROSTATE SURGERY     Laser  . TOTAL HIP ARTHROPLASTY Right 09/26/2017  . TOTAL HIP ARTHROPLASTY Right 09/26/2017   Procedure: TOTAL HIP ARTHROPLASTY ANTERIOR APPROACH;  Surgeon: Frederik Pear, MD;  Location: [facility];  Service: Orthopedics;  Laterality: Right;  Home Medications    Prior to Admission medications   Medication Sig Start Date End Date Taking? Authorizing Provider  amLODipine (NORVASC) 5 MG tablet TAKE 1 TABLET BY MOUTH EVERY DAY 08/29/17   Janith Lima, MD  aspirin EC 325 MG tablet Take 1 tablet (325 mg total) by mouth 2 (two) times daily. 09/26/17   Leighton Parody, PA-C  cyanocobalamin 2000 MCG tablet Take 1 tablet (2,000 mcg total) by mouth daily. Patient taking differently: Take 2,000 mcg by mouth every other day.  06/14/16   Janith Lima, MD  HYDROmorphone (DILAUDID) 2 MG tablet Take 1 tablet (2 mg total) by mouth every 4 (four) hours as needed for severe pain. 09/26/17   Leighton Parody, PA-C  loratadine (CLARITIN) 10 MG tablet Take 10 mg by  mouth daily as needed for allergies.     [provider]  Multiple Vitamins-Minerals (PRESERVISION AREDS 2 PO) Take 1 tablet by mouth daily.    [provider]  pravastatin (PRAVACHOL) 20 MG tablet TAKE 1 TABLET BY MOUTH DAILY 08/29/17   Janith Lima, MD  telmisartan (MICARDIS) 40 MG tablet TAKE 1 TABLET(40 MG) BY MOUTH DAILY 07/14/17   Janith Lima, MD  tiZANidine (ZANAFLEX) 2 MG tablet Take 1 tablet (2 mg total) by mouth every 6 (six) hours as needed for muscle spasms. 09/26/17   Leighton Parody, PA-C  Turmeric Curcumin 500 MG CAPS Take 500 mg by mouth daily.    [provider]    Family History Family History  Problem Relation Age of Onset  . Arthritis Mother   . Vascular Disease Mother        Varicose Veins  . Hypertension Mother   . Hyperlipidemia Mother   . Transient ischemic attack Mother   . Heart disease Mother        After age 47  . Stroke Mother   . Heart attack Mother   . COPD Father   . Bone cancer Maternal Grandmother   . Heart attack Paternal Grandmother 89  . Asthma Neg Hx   . Diabetes Neg Hx   . Colon cancer Neg Hx   . Pancreatic cancer Neg Hx   . Esophageal cancer Neg Hx   . Stomach cancer Neg Hx   . Liver disease Neg Hx     Social History Social History   Tobacco Use  . Smoking status: Current Every Day Smoker    Packs/day: 0.00    Years: 40.00    Pack years: 0.00    Types: Cigarettes  . Smokeless tobacco: Never Used  . Tobacco comment: had quit but started back, smokes 1-2 cig a day  Substance Use Topics  . Alcohol use: No    Comment: 2001- states no rehab  . Drug use: No     Allergies   Lisinopril and Oxycodone   Review of Systems Review of Systems  Musculoskeletal: Positive for arthralgias.  All other systems reviewed and are negative.    Physical Exam Updated Vital Signs BP (!) 180/78 (BP Location: Right Arm)   Pulse 67   Temp 97.6 F (36.4 C) (Temporal)   Resp 16   Ht 5\' 7"  (1.702 m)   Wt  71.2 kg (157 lb)   SpO2 98%   BMI 24.59 kg/m   Physical Exam  Constitutional: He is oriented to person, place, and time. He appears well-developed and well-nourished.  HENT:  Head: Normocephalic and atraumatic.  Mouth/Throat: Oropharynx is clear and moist.  Eyes: Conjunctivae and EOM are normal. Pupils are equal, round, and reactive to light.  Neck: Normal range of motion.  Cardiovascular: Normal rate, regular rhythm and normal heart sounds.  Pulmonary/Chest: Effort normal and breath sounds normal. No stridor. No respiratory distress.  Abdominal: Soft. Bowel sounds are normal.  Musculoskeletal: Normal range of motion.  Surgical bandage present on right hip; no apparent erythema or streaking of right thigh; pain with any movement of right leg; moving toes and foot normally without issue; DP pulse intact right foot; foot warm and well perfused  Neurological: He is alert and oriented to person, place, and time.  Skin: Skin is warm and dry.  Psychiatric: He has a normal mood and affect.  Nursing note and vitals reviewed.    ED Treatments / Results  Labs (all labs ordered are listed, but only abnormal results are displayed) Labs Reviewed - No data to display  EKG  EKG Interpretation None       Radiology Dg Hip Port Unilat W Or Wo Pelvis 1 View Right  Result Date: 10/09/2017 CLINICAL DATA:  Postreduction EXAM: DG HIP (WITH OR WITHOUT PELVIS) 1V PORT RIGHT COMPARISON:  10/09/2017 FINDINGS: Right hip arthroplasty has been relocated since previous study with anatomic position demonstrated. No fractures are seen. Vascular calcifications are prominent. IMPRESSION: Relocation of right hip arthroplasty with anatomic position demonstrated. Electronically Signed   By: Lucienne Capers M.D.   On: 10/09/2017 05:16   Dg Hip Unilat W Or Wo Pelvis 2-3 Views Right  Result Date: 10/09/2017 CLINICAL DATA:  Recent right total hip replacement. Difficulty walking and limited range of motion. Concern  for dislocation. EXAM: DG HIP (WITH OR WITHOUT PELVIS) 2-3V RIGHT COMPARISON:  Intraoperative fluoroscopy 09/26/2017 FINDINGS: Right total hip arthroplasty using non cemented components. There is superior and lateral dislocation of the femoral component from the acetabular cup. Steep acetabular angle. No associated fractures. Prominent vascular calcifications. IMPRESSION: Dislocated right total hip arthroplasty. Electronically Signed   By: Lucienne Capers M.D.   On: 10/09/2017 02:28    Procedures Reduction of dislocation Date/Time: 10/09/2017 4:58 AM Performed by: Larene Pickett, PA-C Authorized by: Larene Pickett, PA-C  Consent: Verbal consent obtained. Risks and benefits: risks, benefits and alternatives were discussed Consent given by: patient Patient understanding: patient states understanding of the procedure being performed Required items: required blood products, implants, devices, and special equipment available Patient identity confirmed: verbally with patient Time out: Immediately prior to procedure a "time out" was called to verify the correct patient, procedure, equipment, support staff and site/side marked as required. Preparation: Patient was prepped and draped in the usual sterile fashion. Local anesthesia used: no  Anesthesia: Local anesthesia used: no  Sedation: Patient sedated: yes Sedation type: moderate (conscious) sedation Sedatives: propofol Analgesia: hydromorphone Sedation start date/time: 10/09/2017 4:40 AM Sedation end date/time: 10/09/2017 4:52 AM Vitals: Vital signs were monitored during sedation.  Patient tolerance: Patient tolerated the procedure well with no immediate complications    (including critical care time)  Medications Ordered in ED Medications  morphine 4 MG/ML injection 4 mg (4 mg Intravenous Given 10/09/17 0237)  HYDROmorphone (DILAUDID) injection 1 mg (1 mg Intravenous Given 10/09/17 0337)  propofol (DIPRIVAN) 10 mg/mL bolus/IV push  71.2 mg (50 mg Intravenous Given 10/09/17 0425)     Initial Impression / Assessment and Plan / ED Course  I have reviewed the triage vital signs and the nursing notes.  Pertinent labs & imaging results that were available during my care of the  patient were reviewed by me and considered in my medical decision making (see chart for details).  72 year old male here with likely dislocated right hip.  He is 13 days status post right total hip replacement with Dr. Mayer Camel.  Leg remains NVI, significant pain with any attempted movement.  Initial x-ray confirms dislocation.  Will speak with orthopedics regarding reductions here vs OR.  3:49 AM Spoke with Dr. Lynann Bologna-- feels ok to try and reduce in ED but if any issues he will come in.  If reduced and patient remains stable, can be discharged to follow-up in clinic this morning with Dr. Mayer Camel as scheduled at 8:45am.  Discussed with patient, he is comfortable with this plan.  Will set up for conscious sedation.  4:58 AM Sedation and reduction performed successfully.  Patient awake, alert, drinking water at this time.  Repeat films appear in normal alignment, official read pending.  Official read with successful reduction.  Patient remains AAOx3.  Tolerating fluids well, feels ready to go.  He will follow-up with Dr. Mayer Camel this morning as scheduled.  He does have pain meds at home he can take when needed.  We discussed moving slowly with extra precautions for the next few days to try to reduce recurrent dislocation.  He understands to return here for any new/acute changes.  Final Clinical Impressions(s) / ED Diagnoses   Final diagnoses:  Hip dislocation, right, initial encounter [facility])    ED Discharge Orders    None       Larene Pickett, PA-C 10/09/17 0554    Merryl Hacker, MD 10/09/17 [phone]

## 2017-10-10 ENCOUNTER — Other Ambulatory Visit: Payer: Self-pay | Admitting: Internal Medicine

## 2017-10-10 DIAGNOSIS — I6523 Occlusion and stenosis of bilateral carotid arteries: Secondary | ICD-10-CM

## 2017-10-10 DIAGNOSIS — I10 Essential (primary) hypertension: Secondary | ICD-10-CM

## 2017-11-26 ENCOUNTER — Other Ambulatory Visit: Payer: Self-pay | Admitting: Internal Medicine

## 2017-11-26 DIAGNOSIS — I6523 Occlusion and stenosis of bilateral carotid arteries: Secondary | ICD-10-CM

## 2017-11-26 DIAGNOSIS — I10 Essential (primary) hypertension: Secondary | ICD-10-CM

## 2017-12-12 ENCOUNTER — Ambulatory Visit: Payer: Medicare Other | Admitting: Family

## 2017-12-12 ENCOUNTER — Encounter (HOSPITAL_COMMUNITY): Payer: Medicare Other

## 2017-12-13 ENCOUNTER — Ambulatory Visit: Payer: Medicare Other | Admitting: Family

## 2017-12-13 ENCOUNTER — Encounter: Payer: Self-pay | Admitting: Family

## 2017-12-13 ENCOUNTER — Other Ambulatory Visit: Payer: Self-pay

## 2017-12-13 ENCOUNTER — Ambulatory Visit (HOSPITAL_COMMUNITY)
Admission: RE | Admit: 2017-12-13 | Discharge: 2017-12-13 | Disposition: A | Discharge status: Home / Self Care | Payer: Medicare Other | Source: Ambulatory Visit | Attending: Family | Admitting: Family

## 2017-12-13 VITALS — BP 124/74 | HR 63 | Temp 97.9°F | Resp 16 | Ht 67.0 in | Wt 167.0 lb

## 2017-12-13 DIAGNOSIS — I6523 Occlusion and stenosis of bilateral carotid arteries: Secondary | ICD-10-CM | POA: Diagnosis not present

## 2017-12-13 DIAGNOSIS — Z87891 Personal history of nicotine dependence: Secondary | ICD-10-CM | POA: Insufficient documentation

## 2017-12-13 NOTE — Patient Instructions (Signed)

## 2017-12-13 NOTE — Progress Notes (Signed)
Chief Complaint: Follow up Extracranial Carotid Artery Stenosis   History of Present Illness  Brian Hunt is a 72 y.o. male whom Dr. 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   He has not had previous carotid artery intervention.  The patient denies any history of TIA or stroke symptoms.Specifically he denies any history of amaurosis fugax or monocular blindness, unilateral facial drooping, hemiparesis, or receptive or expressive aphasia.   He had a hypertensive episode October, 2014, his PCP adjusted his hypertension medication at that time, states he has had no further blood pressure issues, that is why he stopped smoking.  He denies chest pain or dyspnea. He works out in a gym 3x/week. He denies claudication symptoms with walking.  He has had hematuria in the past and work up was negative. His prostate has been biopsied, negative result, sees Dr. Patsy Baltimore.  He had right hip replacement in January 2019, dislocated it 2 weeks later, seen in ED later in January for non surgical relocation of right hip.    Pt Diabetic: No Pt smoker: former smoker, quit in October, 2014  Pt meds include: Statin : Yes, he attributes feeling tired and some muscle aches to statin  ASA: Yes Other anticoagulants/antiplatelets: no   Past Medical History:  Diagnosis Date  . Anemia   . Anxiety   . Arthritis   . Basal cell carcinoma    Basal Cell X2 , Dr Radford Pax  . BPH (benign prostatic hyperplasia)   . Bursitis of left hip    Dr. Ihor Gully  . Carotid artery occlusion   . Complication of anesthesia    pt. states he had vasal vagal reaction after last surgery,after he got to his room  . COPD (chronic obstructive pulmonary disease) ([place])   . Diverticulosis   . Dyspnea    with exertion  . Elevated PSA   . Hyperlipidemia   . Hypertension   . Internal hemorrhoids   . Prostatitis    Dr Terance Hart  . RBBB     Social History Social History   Tobacco Use  .  Smoking status: Former Smoker    Packs/day: 0.00    Years: 40.00    Pack years: 0.00    Types: Cigarettes    Last attempt to quit: 2012    Years since quitting: 7.2  . Smokeless tobacco: Never Used  Substance Use Topics  . Alcohol use: No    Comment: 2001- states no rehab  . Drug use: No    Family History Family History  Problem Relation Age of Onset  . Arthritis Mother   . Vascular Disease Mother        Varicose Veins  . Hypertension Mother   . Hyperlipidemia Mother   . Transient ischemic attack Mother   . Heart disease Mother        After age 49  . Stroke Mother   . Heart attack Mother   . COPD Father   . Bone cancer Maternal Grandmother   . Heart attack Paternal Grandmother 63  . Asthma Neg Hx   . Diabetes Neg Hx   . Colon cancer Neg Hx   . Pancreatic cancer Neg Hx   . Esophageal cancer Neg Hx   . Stomach cancer Neg Hx   . Liver disease Neg Hx     Surgical History Past Surgical History:  Procedure Laterality Date  . COLONOSCOPY  2010   negative; [facility]  . CYSTOSCOPY  10/2014   [facility]; neg  . GREEN LIGHT LASER TURP (TRANSURETHRAL RESECTION OF PROSTATE  09/27/2012   Procedure: GREEN LIGHT LASER TURP (TRANSURETHRAL RESECTION OF PROSTATE;  Surgeon: Fredricka Bonine, MD;  Location: [facility];  Service: Urology;  Laterality: N/A;     . JOINT REPLACEMENT Left Oct. 12, 2012   left total hip replacement by Dr. Mayer Camel  . PROSTATE BIOPSY  2007 , 2009   X2 , Dr Thomasene Mohair  & Dr Terance Hart  . PROSTATE SURGERY     Laser  . TOTAL HIP ARTHROPLASTY Right 09/26/2017  . TOTAL HIP ARTHROPLASTY Right 09/26/2017   Procedure: TOTAL HIP ARTHROPLASTY ANTERIOR APPROACH;  Surgeon: Frederik Pear, MD;  Location: [facility];  Service: Orthopedics;  Laterality: Right;    Allergies  Allergen Reactions  . Lisinopril Cough  . Oxycodone     Mental confusion    Current Outpatient Medications  Medication Sig Dispense Refill  . amLODipine (NORVASC) 5 MG tablet TAKE 1 TABLET BY  MOUTH EVERY DAY 90 tablet 0  . aspirin EC 325 MG tablet Take 1 tablet (325 mg total) by mouth 2 (two) times daily. 30 tablet 0  . cyanocobalamin 2000 MCG tablet Take 1 tablet (2,000 mcg total) by mouth daily. (Patient taking differently: Take 2,000 mcg by mouth every other day. ) 90 tablet 3  . loratadine (CLARITIN) 10 MG tablet Take 10 mg by mouth daily as needed for allergies.     . Multiple Vitamins-Minerals (PRESERVISION AREDS 2 PO) Take 1 tablet by mouth daily.    . pravastatin (PRAVACHOL) 20 MG tablet TAKE 1 TABLET BY MOUTH DAILY 90 tablet 0  . telmisartan (MICARDIS) 40 MG tablet TAKE 1 TABLET(40 MG) BY MOUTH DAILY 90 tablet 0  . Turmeric Curcumin 500 MG CAPS Take 500 mg by mouth daily.     No current facility-administered medications for this visit.     Review of Systems : See HPI for pertinent positives and negatives.  Physical Examination  Vitals:   12/13/17 1053 12/13/17 1055  BP: 127/75 124/74  Pulse: 63   Resp: 16   Temp: 97.9 F (36.6 C)   TempSrc: Oral   SpO2: 99%   Weight: 167 lb (75.8 kg)   Height: 5\' 7"  (1.702 m)    Body mass index is 26.16 kg/m.  General: WDWN male in NAD GAIT:normal HENT: No gross abnormalities  Eyes: PERRLA Pulmonary: Respirations are non-labored, CTAB. Cardiac: regular rhythm, no detected murmur.  VASCULAR EXAM Carotid Bruits Right Left   negative negative    Abdominal aortic pulse is not palpable. Radial pulses are 2+ palpable and equal.      LE Pulses Right Left   POPLITEAL not palpable  not palpable   POSTERIOR TIBIAL  2+palpable   2+palpable    DORSALIS PEDIS  ANTERIOR TIBIAL not palpable  not palpable    Gastrointestinal: soft, nontender, BS WNL, no r/g,no palpable masses. Musculoskeletal: No muscle  atrophy/wasting. M/S 5/5 throughout, extremities without ischemic changes. Skin: No rashes, no ulcers, no cellulitis.   Neurologic:  A&O X 3; appropriate affect, sensation is normal; speech is normal, CN 2-12 intact, pain and light touch intact in extremities, motor exam as listed above. Psychiatric: Normal thought content, mood appropriate to clinical situation.    Assessment: DEAVIN FORST is a 72 y.o. male who has no history of stroke or TIA.  His atherosclerotic risk factors include smokingup until 2014. Fortunately he does not have DM, has a normal BMI, and exercises regularly.   I  discussed with Dr. Oneida Alar pt carotid duplex results on 06-14-17 compared to previous studies, and that he remains asymptomatic of stroke or TIA. CTA of neck on 03-20-11 demonstrated no stenosis in the right ICA and 60% stenosis in the left ICA; no significant change from that time.   DATA Carotid Duplex (06/14/17): Right ICA: 1-39% stenosis, unchanged from 11-17-16 and 06-14-17. Left ICA: 60-79% stenosis, same as on 11-17-16, but 06-14-17 duplex demonstrated 1-39% left ICA stenosis  Bifurcation is located near the hyoid notch.  Bilateral vertebral artery flow is antegrade.  Bilateral subclavian artery waveforms are normal.    03-20-11 CTA neck MPRESSION:  1.  70% stenosis of the proximal left internal carotid artery secondary to calcified and noncalcified plaque. 2.  Calcified plaque at the proximal right internal carotid artery and carotid bifurcation without significant stenosis by NASCET criteria. 3.  Calcified plaque at the origins of the great vessels without significant stenosis. 4.  1.9 cm dominant right thyroid nodule.  Ultrasound or ultrasound guided biopsy may be useful for further evaluation. 5.  Emphysema.  The left internal carotid artery was remeasured at 1.9 mm relative to the distal segment of 4.5 mm, compatible with a stenosis of just less than 60%.  END ADDENDUM, SIGNED BY:  Arna Snipe, M.D.     Plan:  Follow-up in 47monthswith Carotid Duplex scan.  I discussed in depth with the patient the nature of atherosclerosis, and emphasized the importance of maximal medical management including strict control of blood pressure, blood glucose, and lipid levels, obtaining regular exercise, and continued cessation of smoking.  The patient is aware that without maximal medical management the underlying atherosclerotic disease process will progress, limiting the benefit of any interventions. The patient was given information about stroke prevention and what symptoms should prompt the patient to seek immediate medical care. Thank you for allowing [place] to participate in this patient's care.  Clemon Chambers, RN, MSN, FNP-C [facility] Office: [phone]  Clinic Physician: Early  12/13/17 11:09 AM

## 2018-01-22 LAB — PSA: PSA: 3.46

## 2018-01-28 ENCOUNTER — Other Ambulatory Visit: Payer: Self-pay | Admitting: Internal Medicine

## 2018-01-28 DIAGNOSIS — I6523 Occlusion and stenosis of bilateral carotid arteries: Secondary | ICD-10-CM

## 2018-01-28 DIAGNOSIS — I10 Essential (primary) hypertension: Secondary | ICD-10-CM

## 2018-01-28 NOTE — Telephone Encounter (Signed)
Pt needs an appt for refills.  

## 2018-01-29 NOTE — Telephone Encounter (Signed)
Called patient and left message to call back to schedule a follow up appointment for med refills.

## 2018-01-31 ENCOUNTER — Other Ambulatory Visit: Payer: Self-pay | Admitting: Internal Medicine

## 2018-01-31 ENCOUNTER — Other Ambulatory Visit: Payer: Self-pay

## 2018-01-31 ENCOUNTER — Telehealth: Payer: Self-pay | Admitting: Internal Medicine

## 2018-01-31 DIAGNOSIS — I6523 Occlusion and stenosis of bilateral carotid arteries: Secondary | ICD-10-CM

## 2018-01-31 DIAGNOSIS — I10 Essential (primary) hypertension: Secondary | ICD-10-CM

## 2018-01-31 MED ORDER — PRAVASTATIN SODIUM 20 MG PO TABS: 20.0000 mg | ORAL_TABLET | Freq: Every day | ORAL | 0 refills | Status: DC

## 2018-01-31 MED ORDER — TELMISARTAN 40 MG PO TABS: ORAL_TABLET | ORAL | 0 refills | Status: DC

## 2018-01-31 MED ORDER — AMLODIPINE BESYLATE 5 MG PO TABS: 5.0000 mg | ORAL_TABLET | Freq: Every day | ORAL | 0 refills | Status: DC

## 2018-01-31 NOTE — Telephone Encounter (Unsigned)
Copied from [place] [phone] [id number]. Topic: Quick Communication - See Telephone Encounter >> Jan 31, 2018 12:59 PM Hewitt Shorts wrote: Pt is needing refills on telmisartan, amlodipine, pravastatin   [address] [phone]

## 2018-02-02 ENCOUNTER — Other Ambulatory Visit: Payer: Self-pay | Admitting: Internal Medicine

## 2018-02-02 DIAGNOSIS — I10 Essential (primary) hypertension: Secondary | ICD-10-CM

## 2018-02-26 ENCOUNTER — Encounter: Payer: Self-pay | Admitting: Internal Medicine

## 2018-02-26 ENCOUNTER — Other Ambulatory Visit (INDEPENDENT_AMBULATORY_CARE_PROVIDER_SITE_OTHER): Payer: Medicare Other

## 2018-02-26 ENCOUNTER — Ambulatory Visit: Payer: Medicare Other | Admitting: Internal Medicine

## 2018-02-26 VITALS — BP 124/70 | HR 59 | Temp 98.0°F | Resp 16 | Ht 67.0 in | Wt 166.0 lb

## 2018-02-26 DIAGNOSIS — I350 Nonrheumatic aortic (valve) stenosis: Secondary | ICD-10-CM | POA: Diagnosis not present

## 2018-02-26 DIAGNOSIS — I10 Essential (primary) hypertension: Secondary | ICD-10-CM

## 2018-02-26 DIAGNOSIS — R011 Cardiac murmur, unspecified: Secondary | ICD-10-CM

## 2018-02-26 DIAGNOSIS — E538 Deficiency of other specified B group vitamins: Secondary | ICD-10-CM

## 2018-02-26 DIAGNOSIS — I6523 Occlusion and stenosis of bilateral carotid arteries: Secondary | ICD-10-CM | POA: Diagnosis not present

## 2018-02-26 LAB — BASIC METABOLIC PANEL
BUN: 15 mg/dL (ref 6–23)
CALCIUM: 9.2 mg/dL (ref 8.4–10.5)
CO2: 29 mEq/L (ref 19–32)
Chloride: 97 mEq/L (ref 96–112)
Creatinine, Ser: 0.84 mg/dL (ref 0.40–1.50)
GFR: 95.55 mL/min (ref >60.00)
Glucose, Bld: 83 mg/dL (ref 70–99)
Potassium: 4.3 mEq/L (ref 3.5–5.1)
SODIUM: 133 meq/L — AB (ref 135–145)

## 2018-02-26 LAB — CBC WITH DIFFERENTIAL/PLATELET
BASOS ABS: 0.1 10*3/uL (ref 0.0–0.1)
Basophils Relative: 1.2 % (ref 0.0–3.0)
Eosinophils Absolute: 0.5 10*3/uL (ref 0.0–0.7)
Eosinophils Relative: 7.8 % — ABNORMAL HIGH (ref 0.0–5.0)
HEMATOCRIT: 41.1 % (ref 39.0–52.0)
HEMOGLOBIN: 13.8 g/dL (ref 13.0–17.0)
LYMPHS ABS: 1.4 10*3/uL (ref 0.7–4.0)
LYMPHS PCT: 20.9 % (ref 12.0–46.0)
MCHC: 33.6 g/dL (ref 30.0–36.0)
MCV: 94.7 fl (ref 78.0–100.0)
MONOS PCT: 14.8 % — AB (ref 3.0–12.0)
Monocytes Absolute: 1 10*3/uL (ref 0.1–1.0)
NEUTROS PCT: 55.3 % (ref 43.0–77.0)
Neutro Abs: 3.8 10*3/uL (ref 1.4–7.7)
Platelets: 310 10*3/uL (ref 150.0–400.0)
RBC: 4.34 Mil/uL (ref 4.22–5.81)
RDW: 14.1 % (ref 11.5–15.5)
WBC: 6.8 10*3/uL (ref 4.0–10.5)

## 2018-02-26 LAB — VITAMIN B12: Vitamin B-12: 521 pg/mL (ref 211–911)

## 2018-02-26 LAB — FOLATE: Folate: 16.6 ng/mL (ref >5.9)

## 2018-02-26 MED ORDER — TELMISARTAN 40 MG PO TABS: ORAL_TABLET | ORAL | 1 refills | Status: DC

## 2018-02-26 MED ORDER — AMLODIPINE BESYLATE 5 MG PO TABS: 5.0000 mg | ORAL_TABLET | Freq: Every day | ORAL | 1 refills | Status: DC

## 2018-02-26 NOTE — Patient Instructions (Signed)

## 2018-02-26 NOTE — Progress Notes (Signed)
Subjective:  Patient ID: Brian Hunt, male    DOB: 1946/05/28  Age: 72 y.o. MRN: [id number]  CC: Hypertension and Anemia   HPI ISAIYAH FELDHAUS presents for f/up - He tells me his blood pressure has been well controlled.  He has felt well recently with no episodes of DOE, CP, shortness of breath, palpitations, edema, or fatigue.  He is not taking the oral B12 supplement.  Outpatient Medications Prior to Visit  Medication Sig Dispense Refill  . aspirin EC 81 MG tablet Take 81 mg by mouth daily.    Marland Kitchen loratadine (CLARITIN) 10 MG tablet Take 10 mg by mouth daily as needed for allergies.     . Multiple Vitamins-Minerals (PRESERVISION AREDS 2 PO) Take 1 tablet by mouth daily.    . pravastatin (PRAVACHOL) 20 MG tablet TAKE 1 TABLET BY MOUTH EVERY DAY 90 tablet 0  . Turmeric Curcumin 500 MG CAPS Take 500 mg by mouth daily.    Marland Kitchen amLODipine (NORVASC) 5 MG tablet TAKE 1 TABLET BY MOUTH EVERY DAY 90 tablet 0  . telmisartan (MICARDIS) 40 MG tablet TAKE 1 TABLET(40 MG) BY MOUTH DAILY 30 tablet 0  . aspirin EC 325 MG tablet Take 1 tablet (325 mg total) by mouth 2 (two) times daily. 30 tablet 0  . cyanocobalamin 2000 MCG tablet Take 1 tablet (2,000 mcg total) by mouth daily. (Patient taking differently: Take 2,000 mcg by mouth every other day. ) 90 tablet 3   No facility-administered medications prior to visit.     ROS Review of Systems  Constitutional: Negative.  Negative for diaphoresis, fatigue and unexpected weight change.  HENT: Negative.   Eyes: Negative for visual disturbance.  Respiratory: Negative for apnea, cough, chest tightness, shortness of breath and wheezing.   Cardiovascular: Negative for chest pain, palpitations and leg swelling.  Gastrointestinal: Negative for abdominal pain, constipation, diarrhea, nausea and vomiting.  Genitourinary: Negative.  Negative for difficulty urinating.  Musculoskeletal: Negative.  Negative for arthralgias and myalgias.  Skin: Negative.  Negative for  color change.  Neurological: Negative.  Negative for dizziness, weakness and light-headedness.  Hematological: Negative for adenopathy. Does not bruise/bleed easily.  Psychiatric/Behavioral: Negative.     Objective:  BP 124/70 (BP Location: Left Arm, Patient Position: Sitting, Cuff Size: Normal)   Pulse (!) 59   Temp 98 F (36.7 C) (Oral)   Resp 16   Ht 5\' 7"  (1.702 m)   Wt 166 lb (75.3 kg)   SpO2 96%   BMI 26.00 kg/m   BP Readings from Last 3 Encounters:  02/26/18 124/70  12/13/17 124/74  10/09/17 (!) 141/68    Wt Readings from Last 3 Encounters:  02/26/18 166 lb (75.3 kg)  12/13/17 167 lb (75.8 kg)  10/09/17 157 lb (71.2 kg)    Physical Exam  Constitutional: He is oriented to person, place, and time. No distress.  HENT:  Mouth/Throat: Oropharynx is clear and moist. No oropharyngeal exudate.  Eyes: Conjunctivae are normal. No scleral icterus.  Neck: Normal range of motion. Neck supple. No JVD present. No thyromegaly present.  Cardiovascular: Regular rhythm, S1 normal and S2 normal. Bradycardia present.  Murmur heard.  Decrescendo systolic murmur is present with a grade of 1/6.  No diastolic murmur is present. EKG ---  Sinus  Bradycardia  -Right bundle branch block.   ABNORMAL - no change compared to the prior EKG  Pulmonary/Chest: Effort normal and breath sounds normal. No respiratory distress. He has no wheezes. He has no rales.  Abdominal: Soft. Normal appearance and bowel sounds are normal. He exhibits no mass. There is no hepatosplenomegaly. There is no tenderness.  Musculoskeletal: Normal range of motion. He exhibits no edema, tenderness or deformity.  Lymphadenopathy:    He has no cervical adenopathy.  Neurological: He is alert and oriented to person, place, and time.  Skin: Skin is warm and dry. He is not diaphoretic.  Vitals reviewed.   Lab Results  Component Value Date   WBC 6.8 02/26/2018   HGB 13.8 02/26/2018   HCT 41.1 02/26/2018   PLT 310.0  02/26/2018   GLUCOSE 83 02/26/2018   CHOL 142 07/16/2017   TRIG 38.0 07/16/2017   HDL 79.80 07/16/2017   LDLCALC 54 07/16/2017   ALT 26 07/16/2017   AST 21 07/16/2017   NA 133 (L) 02/26/2018   K 4.3 02/26/2018   CL 97 02/26/2018   CREATININE 0.84 02/26/2018   BUN 15 02/26/2018   CO2 29 02/26/2018   TSH 2.08 07/16/2017   PSA 3.46 01/22/2018   INR 1.10 09/24/2017   HGBA1C 5.9 05/11/2015    No results found.  Assessment & Plan:   Aviraj was seen today for hypertension and anemia.  Diagnoses and all orders for this visit:  B12 deficiency- His H&H and B12 level are normal.  Replacement therapy is not indicated. -     CBC with Differential/Platelet; Future -     Vitamin B12; Future -     Folate; Future  Essential hypertension- His blood pressure is well controlled.  Electrolytes and renal function are normal.  We will continue the current ARB and CCB combination. -     Basic metabolic panel; Future -     telmisartan (MICARDIS) 40 MG tablet; TAKE 1 TABLET(40 MG) BY MOUTH DAILY -     amLODipine (NORVASC) 5 MG tablet; Take 1 tablet (5 mg total) by mouth daily.  Murmur- He has a new onset systolic murmur.  He is asymptomatic with this and his EKG is reassuring.  I have asked him to undergo an echocardiogram to see if he has developed aortic stenosis or mitral regurg. -     EKG 12-Lead -     ECHOCARDIOGRAM COMPLETE; Future  Bilateral carotid artery stenosis -     telmisartan (MICARDIS) 40 MG tablet; TAKE 1 TABLET(40 MG) BY MOUTH DAILY   I have discontinued Luca F. Ahr's cyanocobalamin. I have also changed his amLODipine. Additionally, I am having him maintain his loratadine, Turmeric Curcumin, Multiple Vitamins-Minerals (PRESERVISION AREDS 2 PO), pravastatin, aspirin EC, and telmisartan.  Meds ordered this encounter  Medications  . telmisartan (MICARDIS) 40 MG tablet    Sig: TAKE 1 TABLET(40 MG) BY MOUTH DAILY    Dispense:  90 tablet    Refill:  1    Pt. Needs to schedule  an appointment for further refills.  Marland Kitchen amLODipine (NORVASC) 5 MG tablet    Sig: Take 1 tablet (5 mg total) by mouth daily.    Dispense:  90 tablet    Refill:  1    **Patient requests 90 days supply**     Follow-up: Return in about 3 months (around 05/29/2018).  Scarlette Calico, MD

## 2018-03-07 ENCOUNTER — Ambulatory Visit (HOSPITAL_COMMUNITY): Payer: Medicare Other | Attending: Cardiology

## 2018-03-07 ENCOUNTER — Other Ambulatory Visit: Payer: Self-pay

## 2018-03-07 DIAGNOSIS — I10 Essential (primary) hypertension: Secondary | ICD-10-CM | POA: Diagnosis not present

## 2018-03-07 DIAGNOSIS — R011 Cardiac murmur, unspecified: Secondary | ICD-10-CM

## 2018-03-07 DIAGNOSIS — I361 Nonrheumatic tricuspid (valve) insufficiency: Secondary | ICD-10-CM | POA: Diagnosis not present

## 2018-03-07 DIAGNOSIS — I350 Nonrheumatic aortic (valve) stenosis: Secondary | ICD-10-CM | POA: Diagnosis not present

## 2018-03-09 ENCOUNTER — Encounter: Payer: Self-pay | Admitting: Internal Medicine

## 2018-03-09 DIAGNOSIS — I350 Nonrheumatic aortic (valve) stenosis: Secondary | ICD-10-CM

## 2018-03-09 HISTORY — DX: Nonrheumatic aortic (valve) stenosis: I35.0

## 2018-03-30 ENCOUNTER — Telehealth: Payer: Medicare Other | Admitting: Family

## 2018-03-30 DIAGNOSIS — J028 Acute pharyngitis due to other specified organisms: Secondary | ICD-10-CM

## 2018-03-30 DIAGNOSIS — B9689 Other specified bacterial agents as the cause of diseases classified elsewhere: Secondary | ICD-10-CM

## 2018-03-30 MED ORDER — BENZONATATE 100 MG PO CAPS: 100.0000 mg | ORAL_CAPSULE | Freq: Three times a day (TID) | ORAL | 0 refills | Status: DC | PRN

## 2018-03-30 MED ORDER — AZITHROMYCIN 250 MG PO TABS: ORAL_TABLET | ORAL | 0 refills | Status: DC

## 2018-03-30 NOTE — Progress Notes (Signed)
Thank you for the details you included in the comment boxes. Those details are very helpful in determining the best course of treatment for you and help [place] to provide the best care. This sounds like a combination sinusitis and upper respiratory infection as well. The plan below will be treating both.  We are sorry that you are not feeling well.  Here is how we plan to help!  Based on your presentation I believe you most likely have A cough due to bacteria.  When patients have a fever and a productive cough with a change in color or increased sputum production, we are concerned about bacterial bronchitis.  If left untreated it can progress to pneumonia.  If your symptoms do not improve with your treatment plan it is important that you contact your provider.   I have prescribed Azithromyin 250 mg: two tablets now and then one tablet daily for 4 additonal days    In addition you may use A non-prescription cough medication called Mucinex DM: take 2 tablets every 12 hours. and A prescription cough medication called Tessalon Perles 100mg . You may take 1-2 capsules every 8 hours as needed for your cough.   From your responses in the eVisit questionnaire you describe inflammation in the upper respiratory tract which is causing a significant cough.  This is commonly called Bronchitis and has four common causes:    Allergies  Viral Infections  Acid Reflux  Bacterial Infection Allergies, viruses and acid reflux are treated by controlling symptoms or eliminating the cause. An example might be a cough caused by taking certain blood pressure medications. You stop the cough by changing the medication. Another example might be a cough caused by acid reflux. Controlling the reflux helps control the cough.  USE OF BRONCHODILATOR ("RESCUE") INHALERS: There is a risk from using your bronchodilator too frequently.  The risk is that over-reliance on a medication which only relaxes the muscles surrounding the  breathing tubes can reduce the effectiveness of medications prescribed to reduce swelling and congestion of the tubes themselves.  Although you feel brief relief from the bronchodilator inhaler, your asthma may actually be worsening with the tubes becoming more swollen and filled with mucus.  This can delay other crucial treatments, such as oral steroid medications. If you need to use a bronchodilator inhaler daily, several times per day, you should discuss this with your provider.  There are probably better treatments that could be used to keep your asthma under control.     HOME CARE . Only take medications as instructed by your medical team. . Complete the entire course of an antibiotic. . Drink plenty of fluids and get plenty of rest. . Avoid close contacts especially the very young and the elderly . Cover your mouth if you cough or cough into your sleeve. . Always remember to wash your hands . A steam or ultrasonic humidifier can help congestion.   GET HELP RIGHT AWAY IF: . You develop worsening fever. . You become short of breath . You cough up blood. . Your symptoms persist after you have completed your treatment plan MAKE SURE YOU   Understand these instructions.  Will watch your condition.  Will get help right away if you are not doing well or get worse.  Your e-visit answers were reviewed by a board certified advanced clinical practitioner to complete your personal care plan.  Depending on the condition, your plan could have included both over the counter or prescription medications. If there is  a problem please reply  once you have received a response from your provider. Your safety is important to [place].  If you have drug allergies check your prescription carefully.    You can use MyChart to ask questions about today's visit, request a non-urgent call back, or ask for a work or school excuse for 24 hours related to this e-Visit. If it has been greater than 24 hours you will need  to follow up with your provider, or enter a new e-Visit to address those concerns. You will get an e-mail in the next two days asking about your experience.  I hope that your e-visit has been valuable and will speed your recovery. Thank you for using e-visits.

## 2018-04-30 ENCOUNTER — Other Ambulatory Visit: Payer: Self-pay | Admitting: Internal Medicine

## 2018-06-20 ENCOUNTER — Ambulatory Visit (HOSPITAL_COMMUNITY)
Admission: RE | Admit: 2018-06-20 | Discharge: 2018-06-20 | Disposition: A | Discharge status: Home / Self Care | Payer: Medicare Other | Source: Ambulatory Visit | Attending: Family | Admitting: Family

## 2018-06-20 ENCOUNTER — Ambulatory Visit (INDEPENDENT_AMBULATORY_CARE_PROVIDER_SITE_OTHER): Payer: Medicare Other | Admitting: Family

## 2018-06-20 ENCOUNTER — Other Ambulatory Visit: Payer: Self-pay

## 2018-06-20 ENCOUNTER — Encounter: Payer: Self-pay | Admitting: Family

## 2018-06-20 VITALS — BP 126/78 | HR 53 | Temp 97.0°F | Resp 16 | Ht 67.0 in | Wt 165.0 lb

## 2018-06-20 DIAGNOSIS — I6523 Occlusion and stenosis of bilateral carotid arteries: Secondary | ICD-10-CM | POA: Insufficient documentation

## 2018-06-20 DIAGNOSIS — E785 Hyperlipidemia, unspecified: Secondary | ICD-10-CM | POA: Insufficient documentation

## 2018-06-20 DIAGNOSIS — Z87891 Personal history of nicotine dependence: Secondary | ICD-10-CM

## 2018-06-20 DIAGNOSIS — I10 Essential (primary) hypertension: Secondary | ICD-10-CM | POA: Diagnosis not present

## 2018-06-20 NOTE — Patient Instructions (Signed)

## 2018-06-20 NOTE — Progress Notes (Signed)
Chief Complaint: Follow up Extracranial Carotid Artery Stenosis   History of Present Illness  USBALDO PANNONE is a 72 y.o. male whom Dr. 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   He has not had previous carotid artery intervention.  The patient denies any history of TIA or stroke symptoms.Specifically he denies any history of amaurosis fugax or monocular blindness, unilateral facial drooping, hemiparesis, or receptive or expressive aphasia.   He had a hypertensive episode October, 2014, his PCP adjusted his hypertension medication at that time, states he has had no further blood pressure issues, that is why he stopped smoking.   He denies chest pain or dyspnea. He denies feeling light headed.   He works out in a gym 2-3x/week. He denies claudication symptoms with walking.  He has had hematuria in the past and work up was negative. His prostate has been biopsied, negative result, sees Dr. Patsy Baltimore.  He had right hip replacement in January 2019, dislocated it 2 weeks later, seen in ED later in January for non surgical relocation of right hip.   Diabetic: No Tobacco use: former smoker, quit in October, 2014  Pt meds include: Statin : Yes, he attributes feeling tired and some muscle aches tostatin ASA: Yes Other anticoagulants/antiplatelets: no    Past Medical History:  Diagnosis Date  . Anemia   . Anxiety   . Arthritis   . Basal cell carcinoma    Basal Cell X2 , Dr Radford Pax  . BPH (benign prostatic hyperplasia)   . Bursitis of left hip    Dr. Ihor Gully  . Carotid artery occlusion   . Complication of anesthesia    pt. states he had vasal vagal reaction after last surgery,after he got to his room  . COPD (chronic obstructive pulmonary disease) ([place])   . Diverticulosis   . Dyspnea    with exertion  . Elevated PSA   . Heart murmur    Diagnosed 03/2018.  Marland Kitchen Hyperlipidemia   . Hypertension   . Internal hemorrhoids   . Prostatitis     Dr Terance Hart  . RBBB     Social History Social History   Tobacco Use  . Smoking status: Former Smoker    Packs/day: 0.00    Years: 40.00    Pack years: 0.00    Types: Cigarettes    Last attempt to quit: 2012    Years since quitting: 7.7  . Smokeless tobacco: Never Used  Substance Use Topics  . Alcohol use: No    Comment: 2001- states no rehab  . Drug use: No    Family History Family History  Problem Relation Age of Onset  . Arthritis Mother   . Vascular Disease Mother        Varicose Veins  . Hypertension Mother   . Hyperlipidemia Mother   . Transient ischemic attack Mother   . Heart disease Mother        After age 32  . Stroke Mother   . Heart attack Mother   . COPD Father   . Bone cancer Maternal Grandmother   . Heart attack Paternal Grandmother 75  . Asthma Neg Hx   . Diabetes Neg Hx   . Colon cancer Neg Hx   . Pancreatic cancer Neg Hx   . Esophageal cancer Neg Hx   . Stomach cancer Neg Hx   . Liver disease Neg Hx     Surgical History Past Surgical History:  Procedure Laterality Date  . COLONOSCOPY  2010   negative; [facility]  . CYSTOSCOPY  10/2014   [facility]; neg  . GREEN LIGHT LASER TURP (TRANSURETHRAL RESECTION OF PROSTATE  09/27/2012   Procedure: GREEN LIGHT LASER TURP (TRANSURETHRAL RESECTION OF PROSTATE;  Surgeon: Fredricka Bonine, MD;  Location: [facility];  Service: Urology;  Laterality: N/A;     . JOINT REPLACEMENT Left Oct. 12, 2012   left total hip replacement by Dr. Mayer Camel  . PROSTATE BIOPSY  2007 , 2009   X2 , Dr Thomasene Mohair  & Dr Terance Hart  . PROSTATE SURGERY     Laser  . TOTAL HIP ARTHROPLASTY Right 09/26/2017  . TOTAL HIP ARTHROPLASTY Right 09/26/2017   Procedure: TOTAL HIP ARTHROPLASTY ANTERIOR APPROACH;  Surgeon: Frederik Pear, MD;  Location: [facility];  Service: Orthopedics;  Laterality: Right;    Allergies  Allergen Reactions  . Lisinopril Cough  . Oxycodone     Mental confusion    Current Outpatient Medications   Medication Sig Dispense Refill  . amLODipine (NORVASC) 5 MG tablet Take 1 tablet (5 mg total) by mouth daily. 90 tablet 1  . aspirin EC 81 MG tablet Take 81 mg by mouth daily.    Marland Kitchen loratadine (CLARITIN) 10 MG tablet Take 10 mg by mouth daily as needed for allergies.     . Multiple Vitamins-Minerals (PRESERVISION AREDS 2 PO) Take 1 tablet by mouth daily.    . pravastatin (PRAVACHOL) 20 MG tablet TAKE 1 TABLET BY MOUTH EVERY DAY 90 tablet 1  . telmisartan (MICARDIS) 40 MG tablet TAKE 1 TABLET(40 MG) BY MOUTH DAILY 90 tablet 1  . Turmeric Curcumin 500 MG CAPS Take 500 mg by mouth daily.     No current facility-administered medications for this visit.     Review of Systems : See HPI for pertinent positives and negatives.  Physical Examination  Vitals:   06/20/18 1016 06/20/18 1018  BP: 132/79 126/78  Pulse: (!) 53   Resp: 16   Temp: (!) 97 F (36.1 C)   TempSrc: Oral   SpO2: 99%   Weight: 165 lb (74.8 kg)   Height: 5\' 7"  (1.702 m)    Body mass index is 25.84 kg/m.  General: WDWN male in NAD GAIT:normal HENT: No gross abnormalities  Eyes: PERRLA Pulmonary: Respirations are non-labored, CTAB. Cardiac: regular rhythm, bradycardic (not on a beta blocker), recheck of heart rate is 48/minute, no detected murmur.  VASCULAR EXAM Carotid Bruits Right Left   negative negative    Abdominal aortic pulseis not palpable. Radial pulses are 2+ palpable and equal.      LE Pulses Right Left   POPLITEAL not palpable  not palpable   POSTERIOR TIBIAL  2+palpable   2+palpable    DORSALIS PEDIS  ANTERIOR TIBIAL 1+ palpable  not palpable    Gastrointestinal: soft, nontender, BS WNL, no r/g,no palpable masses. Musculoskeletal: No muscle atrophy/wasting. M/S 5/5 throughout,  extremities without ischemic changes. Skin: No rashes, no ulcers, no cellulitis.   Neurologic:  A&O X 3; appropriate affect, sensation is normal; speech is normal, CN 2-12 intact, pain and light touch intact in extremities, motor exam as listed above. Psychiatric: Normal thought content, mood appropriate to clinical situation   Assessment: AZRIEL JAKOB is a 72 y.o. male who has no history of stroke or TIA.  His atherosclerotic risk factors include smokingup until 2014. Fortunately he does not have DM, has a normal BMI, and exercises regularly.   I discussed with Dr. Oneida Alar pt carotid duplex results  on 06-14-17 compared to previous studies, and that he remains asymptomatic of stroke or TIA. CTA of neck on 03-20-11 demonstrated no stenosis in the right ICA and 60% stenosis in the left ICA; no significant change from that time.   - asymptomatic bradycardia, not on a beta blocker:  I advised pt to notify his PCP if he develops dizziness or feeling faint.   DATA  Carotid Duplex (06-20-18): Right ICA: 1-39% stenosis Left ICA: 60-79% stenosis Bilateral vertebral artery flow is antegrade.  Left subclavian artery waveforms are normal, right are disturbed.   No change from the exam on 12-14-17 except for disturbed right subclavian artery flow.    03-20-11 CTA neck: 1. 70% stenosis of the proximal left internal carotid artery secondary to calcified and noncalcified plaque. 2. Calcified plaque at the proximal right internal carotid artery and carotid bifurcation without significant stenosis by NASCET criteria. 3. Calcified plaque at the origins of the great vessels without significant stenosis. 4. 1.9 cm dominant right thyroid nodule. Ultrasound or ultrasound guided biopsy may be useful for further evaluation. 5. Emphysema.  The left internal carotid artery was remeasured at 1.9 mm relative to the distal segment of 4.5 mm, compatible with a stenosis of just less than  60%.   Plan:  Follow-up in 62monthswith Carotid Duplex scan.  I discussed in depth with the patient the nature of atherosclerosis, and emphasized the importance of maximal medical management including strict control of blood pressure, blood glucose, and lipid levels, obtaining regular exercise, and continued cessation of smoking.  The patient is aware that without maximal medical management the underlying atherosclerotic disease process will progress, limiting the benefit of any interventions. The patient was given information about stroke prevention and what symptoms should prompt the patient to seek immediate medical care. Thank you for allowing [place] to participate in this patient's care.  Clemon Chambers, RN, MSN, FNP-C [facility] Office: [phone]  Clinic Physician: Oneida Alar  06/20/18 10:21 AM

## 2018-08-05 ENCOUNTER — Ambulatory Visit: Payer: Medicare Other | Admitting: Urology

## 2018-08-05 ENCOUNTER — Encounter: Payer: Self-pay | Admitting: Urology

## 2018-08-05 ENCOUNTER — Other Ambulatory Visit: Payer: Self-pay

## 2018-08-05 VITALS — BP 160/80 | HR 73 | Ht 67.0 in | Wt 163.9 lb

## 2018-08-05 DIAGNOSIS — N486 Induration penis plastica: Secondary | ICD-10-CM

## 2018-08-05 DIAGNOSIS — N529 Male erectile dysfunction, unspecified: Secondary | ICD-10-CM

## 2018-08-05 MED ORDER — TADALAFIL 5 MG PO TABS: 5.0000 mg | ORAL_TABLET | Freq: Every day | ORAL | 11 refills | Status: DC | PRN

## 2018-08-05 NOTE — Progress Notes (Signed)
08/05/2018 2:22 PM   [name] 03/09/1971 [id number]  Referring provider: Janith Lima, MD [address], [place] [address]  CC: Peyronies, ED  HPI: I had the pleasure of seeing Brian Hunt in urology clinic today for Peyronie's disease and erectile dysfunction in consultation from Dr. Ronnald Ramp.  He is also followed by Dr. Junious Silk at [facility]rology in [place], and previously underwent renal light photo vaporization of the prostate with Dr. Junious Silk in 2014.  He is here today to discuss possible Xiaflex.  He does report difficulty with erections that has improved previously with Viagra or Cialis.  He has not tried any of these medications recently and does not have an active prescription.  He does report a curvature of approximately 10 to 15 degrees with erections that makes intercourse difficult.  This is not increased over the last few months.  He has noted curvature for at least 3 years.  He is uncircumcised.  He has never tried anything for his curvature before.  He denies any specific traumatic event.  He denies gross hematuria or significant urinary symptoms.  He is not having any penile pain with erections at this time.   Most recent PSA was 3.46 in May 2019.   PMH: Past Medical History:  Diagnosis Date  . Anemia   . Anxiety   . Arthritis   . Basal cell carcinoma    Basal Cell X2 , Dr Radford Pax  . BPH (benign prostatic hyperplasia)   . Bursitis of left hip    Dr. Ihor Gully  . Carotid artery occlusion   . Complication of anesthesia    pt. states he had vasal vagal reaction after last surgery,after he got to his room  . COPD (chronic obstructive pulmonary disease) ([place])   . Diverticulosis   . Dyspnea    with exertion  . Elevated PSA   . Heart murmur    Diagnosed 03/2018.  Marland Kitchen Hyperlipidemia   . Hypertension   . Internal hemorrhoids   . Prostatitis    Dr Terance Hart  . RBBB     Surgical History: Past Surgical History:  Procedure Laterality Date    . COLONOSCOPY  2010   negative; [facility]  . CYSTOSCOPY  10/2014   [facility]; neg  . GREEN LIGHT LASER TURP (TRANSURETHRAL RESECTION OF PROSTATE  09/27/2012   Procedure: GREEN LIGHT LASER TURP (TRANSURETHRAL RESECTION OF PROSTATE;  Surgeon: Fredricka Bonine, MD;  [facility];  Service: Urology;  Laterality: N/A;     . JOINT REPLACEMENT Left Oct. 12, 2012   left total hip replacement by Dr. Mayer Camel  . PROSTATE BIOPSY  2007 , 2009   X2 , Dr Thomasene Mohair  & Dr Terance Hart  . PROSTATE SURGERY     Laser  . TOTAL HIP ARTHROPLASTY Right 09/26/2017  . TOTAL HIP ARTHROPLASTY Right 09/26/2017   Procedure: TOTAL HIP ARTHROPLASTY ANTERIOR APPROACH;  Surgeon: Frederik Pear, MD;  Location: [facility];  Service: Orthopedics;  Laterality: Right;    Allergies:  Allergies  Allergen Reactions  . Lisinopril Cough  . Oxycodone     Mental confusion    Family History: Family History  Problem Relation Age of Onset  . Arthritis Mother   . Vascular Disease Mother        Varicose Veins  . Hypertension Mother   . Hyperlipidemia Mother   . Transient ischemic attack Mother   . Heart disease Mother        After age  58  . Stroke Mother   . Heart attack Mother   . COPD Father   . Bone cancer Maternal Grandmother   . Heart attack Paternal Grandmother 55  . Asthma Neg Hx   . Diabetes Neg Hx   . Colon cancer Neg Hx   . Pancreatic cancer Neg Hx   . Esophageal cancer Neg Hx   . Stomach cancer Neg Hx   . Liver disease Neg Hx     Social History:  reports that he quit smoking about 7 years ago. His smoking use included cigarettes. He smoked 0.00 packs per day for 40.00 years. He has never used smokeless tobacco. He reports that he does not drink alcohol or use drugs.  ROS: Please see flowsheet from today's date for complete review of systems.  Physical Exam: BP (!) 160/80   Pulse 73   Ht 5\' 7"  (1.702 m)   Wt 163 lb 14.4 oz (74.3 kg)   BMI 25.67 kg/m    Constitutional:  Alert and oriented,  No acute distress. Cardiovascular: No clubbing, cyanosis, or edema. Respiratory: Normal respiratory effort, no increased work of breathing. GI: Abdomen is soft, nontender, nondistended, no abdominal masses GU: No CVA tenderness, uncircumcised phallus, possible subtle plaque at the left dorsal base. Lymph: No cervical or inguinal lymphadenopathy. Skin: No rashes, bruises or suspicious lesions. Neurologic: Grossly intact, no focal deficits, moving all 4 extremities. Psychiatric: Normal mood and affect.  Pertinent Imaging: None to review  Assessment & Plan:   In summary, Mr. [name] is a 72 year old male with very mild Peyronie's disease that appears to be in the stable phase.  He also is having some difficulty with erections and has not trialed PDE 5 inhibitors recently.  We had a long conversation about his Peyronie's disease and erectile dysfunction.  Regarding his erections did recommend a trial of a PDE 5 inhibitor, and tadalafil was prescribed in good Rx coupon provided.  In terms of his Peyronie's disease, his curvature is not severe enough to warrant Xiaflex at this time(<30 degrees), but I encouraged him to perform penile modeling at home.  Finally, we discussed that inflatable penile implant is a good option for patients with erectile dysfunction refractory to PDE 5 inhibitors or injections with concurrent penile curvature.  A brochure was provided about this procedure today.  RTC in 3 months to discuss erections and curvature Consider referral to Dr. Francesca Jewett at [facility] if he desires penile prosthesis  Total of 30 minutes were spent with the patient today, greater than 50% were spent in direct face-to-face patient education and counseling regarding erectile dysfunction and Peyronie's disease.  Billey Co, [facility] [address], [place], [place] [address] [phone]

## 2018-08-07 ENCOUNTER — Encounter: Payer: Medicare Other | Admitting: Internal Medicine

## 2018-08-19 ENCOUNTER — Ambulatory Visit (INDEPENDENT_AMBULATORY_CARE_PROVIDER_SITE_OTHER): Payer: Medicare Other | Admitting: Internal Medicine

## 2018-08-19 ENCOUNTER — Other Ambulatory Visit (INDEPENDENT_AMBULATORY_CARE_PROVIDER_SITE_OTHER): Payer: Medicare Other

## 2018-08-19 ENCOUNTER — Encounter: Payer: Self-pay | Admitting: Internal Medicine

## 2018-08-19 VITALS — BP 130/70 | HR 60 | Temp 98.5°F | Resp 16 | Ht 67.0 in | Wt 164.0 lb

## 2018-08-19 DIAGNOSIS — E538 Deficiency of other specified B group vitamins: Secondary | ICD-10-CM

## 2018-08-19 DIAGNOSIS — Z23 Encounter for immunization: Secondary | ICD-10-CM | POA: Diagnosis not present

## 2018-08-19 DIAGNOSIS — E871 Hypo-osmolality and hyponatremia: Secondary | ICD-10-CM

## 2018-08-19 DIAGNOSIS — I10 Essential (primary) hypertension: Secondary | ICD-10-CM

## 2018-08-19 DIAGNOSIS — Z0000 Encounter for general adult medical examination without abnormal findings: Secondary | ICD-10-CM | POA: Diagnosis not present

## 2018-08-19 DIAGNOSIS — E785 Hyperlipidemia, unspecified: Secondary | ICD-10-CM

## 2018-08-19 LAB — CBC WITH DIFFERENTIAL/PLATELET
Basophils Absolute: 0.1 10*3/uL (ref 0.0–0.1)
Basophils Relative: 1.2 % (ref 0.0–3.0)
Eosinophils Absolute: 0.6 10*3/uL (ref 0.0–0.7)
Eosinophils Relative: 7.3 % — ABNORMAL HIGH (ref 0.0–5.0)
HCT: 40.5 % (ref 39.0–52.0)
Hemoglobin: 14 g/dL (ref 13.0–17.0)
Lymphocytes Relative: 21 % (ref 12.0–46.0)
Lymphs Abs: 1.7 10*3/uL (ref 0.7–4.0)
MCHC: 34.5 g/dL (ref 30.0–36.0)
MCV: 95.1 fl (ref 78.0–100.0)
MONO ABS: 1 10*3/uL (ref 0.1–1.0)
Monocytes Relative: 12.1 % — ABNORMAL HIGH (ref 3.0–12.0)
Neutro Abs: 4.7 10*3/uL (ref 1.4–7.7)
Neutrophils Relative %: 58.4 % (ref 43.0–77.0)
Platelets: 351 10*3/uL (ref 150.0–400.0)
RBC: 4.25 Mil/uL (ref 4.22–5.81)
RDW: 13.8 % (ref 11.5–15.5)
WBC: 8 10*3/uL (ref 4.0–10.5)

## 2018-08-19 LAB — COMPREHENSIVE METABOLIC PANEL
ALBUMIN: 4 g/dL (ref 3.5–5.2)
ALT: 12 U/L (ref 0–53)
AST: 15 U/L (ref 0–37)
Alkaline Phosphatase: 64 U/L (ref 39–117)
BUN: 20 mg/dL (ref 6–23)
CO2: 25 mEq/L (ref 19–32)
Calcium: 9.4 mg/dL (ref 8.4–10.5)
Chloride: 99 mEq/L (ref 96–112)
Creatinine, Ser: 0.82 mg/dL (ref 0.40–1.50)
GFR: 98.12 mL/min (ref >60.00)
Glucose, Bld: 87 mg/dL (ref 70–99)
Potassium: 4.3 mEq/L (ref 3.5–5.1)
Sodium: 134 mEq/L — ABNORMAL LOW (ref 135–145)
Total Bilirubin: 0.4 mg/dL (ref 0.2–1.2)
Total Protein: 7.5 g/dL (ref 6.0–8.3)

## 2018-08-19 LAB — URINALYSIS, ROUTINE W REFLEX MICROSCOPIC
Bilirubin Urine: NEGATIVE
HGB URINE DIPSTICK: NEGATIVE
Ketones, ur: NEGATIVE
LEUKOCYTES UA: NEGATIVE
NITRITE: NEGATIVE
RBC / HPF: NONE SEEN (ref 0–?)
Specific Gravity, Urine: 1.015 (ref 1.000–1.030)
Total Protein, Urine: NEGATIVE
Urine Glucose: NEGATIVE
Urobilinogen, UA: 0.2 (ref 0.0–1.0)
pH: 6.5 (ref 5.0–8.0)

## 2018-08-19 LAB — LIPID PANEL
Cholesterol: 148 mg/dL (ref 0–200)
HDL: 50.2 mg/dL (ref >39.00)
LDL Cholesterol: 87 mg/dL (ref 0–99)
NONHDL: 98.09
Total CHOL/HDL Ratio: 3
Triglycerides: 56 mg/dL (ref 0.0–149.0)
VLDL: 11.2 mg/dL (ref 0.0–40.0)

## 2018-08-19 LAB — TSH: TSH: 3.65 u[IU]/mL (ref 0.35–4.50)

## 2018-08-19 LAB — FOLATE: Folate: 12.7 ng/mL (ref >5.9)

## 2018-08-19 LAB — VITAMIN B12: Vitamin B-12: 544 pg/mL (ref 211–911)

## 2018-08-19 NOTE — Progress Notes (Signed)
Subjective:  Patient ID: Brian Hunt, male    DOB: 10/02/1945  Age: 72 y.o. MRN: [id number]  CC: Hypertension; Hyperlipidemia; and Annual Exam   HPI [name] presents for a CPX.  He tells me his blood pressure has been well controlled.  He is very active and denies any recent episodes of CP/DOE/palpitations/edema/fatigue.  Past Medical History:  Diagnosis Date  . Anemia   . Anxiety   . Arthritis   . Basal cell carcinoma    Basal Cell X2 , Dr Radford Pax  . BPH (benign prostatic hyperplasia)   . Bursitis of left hip    Dr. Ihor Gully  . Carotid artery occlusion   . Complication of anesthesia    pt. states he had vasal vagal reaction after last surgery,after he got to his room  . COPD (chronic obstructive pulmonary disease) ([place])   . Diverticulosis   . Dyspnea    with exertion  . Elevated PSA   . Heart murmur    Diagnosed 03/2018.  Marland Kitchen Hyperlipidemia   . Hypertension   . Internal hemorrhoids   . Prostatitis    Dr Terance Hart  . RBBB    Past Surgical History:  Procedure Laterality Date  . COLONOSCOPY  2010   negative; [facility]  . CYSTOSCOPY  10/2014   [facility]; neg  . GREEN LIGHT LASER TURP (TRANSURETHRAL RESECTION OF PROSTATE  09/27/2012   Procedure: GREEN LIGHT LASER TURP (TRANSURETHRAL RESECTION OF PROSTATE;  Surgeon: Fredricka Bonine, MD;  [facility];  Service: Urology;  Laterality: N/A;     . JOINT REPLACEMENT Left Oct. 12, 2012   left total hip replacement by Dr. Mayer Camel  . PROSTATE BIOPSY  2007 , 2009   X2 , Dr Thomasene Mohair  & Dr Terance Hart  . PROSTATE SURGERY     Laser  . TOTAL HIP ARTHROPLASTY Right 09/26/2017  . TOTAL HIP ARTHROPLASTY Right 09/26/2017   Procedure: TOTAL HIP ARTHROPLASTY ANTERIOR APPROACH;  Surgeon: Frederik Pear, MD;  Location: [facility];  Service: Orthopedics;  Laterality: Right;    reports that he quit smoking about 7 years ago. His smoking use included cigarettes. He smoked 0.00 packs per day for 40.00 years. He has never used  smokeless tobacco. He reports that he does not drink alcohol or use drugs. family history includes Arthritis in his mother; Bone cancer in his maternal grandmother; COPD in his father; Heart attack in his mother; Heart attack (age of onset: 50) in his paternal grandmother; Heart disease in his mother; Hyperlipidemia in his mother; Hypertension in his mother; Stroke in his mother; Transient ischemic attack in his mother; Vascular Disease in his mother. Allergies  Allergen Reactions  . Lisinopril Cough  . Oxycodone     Mental confusion    Outpatient Medications Prior to Visit  Medication Sig Dispense Refill  . amLODipine (NORVASC) 5 MG tablet Take 1 tablet (5 mg total) by mouth daily. 90 tablet 1  . aspirin EC 81 MG tablet Take 81 mg by mouth daily.    Marland Kitchen loratadine (CLARITIN) 10 MG tablet Take 10 mg by mouth daily as needed for allergies.     . Multiple Vitamins-Minerals (PRESERVISION AREDS 2 PO) Take 1 tablet by mouth daily.    . pravastatin (PRAVACHOL) 20 MG tablet TAKE 1 TABLET BY MOUTH EVERY DAY 90 tablet 1  . tadalafil (CIALIS) 5 MG tablet Take 1 tablet (5 mg total) by mouth daily as needed for erectile dysfunction. 30 tablet 11  . telmisartan (  MICARDIS) 40 MG tablet TAKE 1 TABLET(40 MG) BY MOUTH DAILY 90 tablet 1  . Turmeric Curcumin 500 MG CAPS Take 500 mg by mouth daily.     No facility-administered medications prior to visit.     ROS Review of Systems  Constitutional: Negative.  Negative for diaphoresis, fatigue and unexpected weight change.  HENT: Negative.   Eyes: Negative for visual disturbance.  Respiratory: Negative for cough, chest tightness, shortness of breath and wheezing.   Cardiovascular: Negative for chest pain, palpitations and leg swelling.  Gastrointestinal: Negative.  Negative for abdominal pain, constipation, diarrhea, nausea and vomiting.  Endocrine: Negative.  Negative for cold intolerance.  Genitourinary: Negative.  Negative for difficulty urinating, scrotal  swelling, testicular pain and urgency.  Musculoskeletal: Negative.  Negative for arthralgias and myalgias.  Skin: Negative.   Neurological: Negative.  Negative for dizziness, weakness, light-headedness and headaches.  Hematological: Negative for adenopathy. Does not bruise/bleed easily.  Psychiatric/Behavioral: Negative.     Objective:  BP 130/70 (BP Location: Left Arm, Patient Position: Sitting, Cuff Size: Normal)   Pulse 60   Temp 98.5 F (36.9 C) (Oral)   Resp 16   Ht 5\' 7"  (1.702 m)   Wt 164 lb (74.4 kg)   SpO2 95%   BMI 25.69 kg/m   BP Readings from Last 3 Encounters:  08/19/18 130/70  08/05/18 (!) 160/80  06/20/18 126/78    Wt Readings from Last 3 Encounters:  08/19/18 164 lb (74.4 kg)  08/05/18 163 lb 14.4 oz (74.3 kg)  06/20/18 165 lb (74.8 kg)    Physical Exam  Constitutional: He is oriented to person, place, and time. No distress.  HENT:  Mouth/Throat: Oropharynx is clear and moist. No oropharyngeal exudate.  Eyes: Conjunctivae are normal. No scleral icterus.  Neck: Normal range of motion. Neck supple. No JVD present. No thyromegaly present.  Cardiovascular: Normal rate and regular rhythm. Exam reveals no gallop.  Murmur heard.  Systolic murmur is present with a grade of 1/6.  No diastolic murmur is present. Pulmonary/Chest: Effort normal and breath sounds normal. No respiratory distress. He has no wheezes. He has no rales.  Abdominal: Soft. Bowel sounds are normal. He exhibits no mass. There is no hepatosplenomegaly. There is no tenderness.  Genitourinary:  Genitourinary Comments: GU and rectal exams were deferred at his request since he tells me they were just done 2 weeks ago by his urologist  Musculoskeletal: Normal range of motion. He exhibits no edema, tenderness or deformity.  Lymphadenopathy:    He has no cervical adenopathy.  Neurological: He is alert and oriented to person, place, and time.  Skin: Skin is warm and dry. No rash noted. He is not  diaphoretic.  Vitals reviewed.   Lab Results  Component Value Date   WBC 8.0 08/19/2018   HGB 14.0 08/19/2018   HCT 40.5 08/19/2018   PLT 351.0 08/19/2018   GLUCOSE 87 08/19/2018   CHOL 148 08/19/2018   TRIG 56.0 08/19/2018   HDL 50.20 08/19/2018   LDLCALC 87 08/19/2018   ALT 12 08/19/2018   AST 15 08/19/2018   NA 134 (L) 08/19/2018   K 4.3 08/19/2018   CL 99 08/19/2018   CREATININE 0.82 08/19/2018   BUN 20 08/19/2018   CO2 25 08/19/2018   TSH 3.65 08/19/2018   PSA 3.46 01/22/2018   INR 1.10 09/24/2017   HGBA1C 5.9 05/11/2015    Vas US Carotid  Result Date: 06/21/2018 Carotid Arterial Duplex Study Indications:  Carotid artery disease. Risk Factors:                          Hypertension, hyperlipidemia, past                                        history of smoking. Comparison Study:                      12/13/2017                                        RT- 1-39% stenosis 74/24                                        LT - 60-79% stenosis 227/76 Pre-Surgical Evaluation & Surgical     Stenosis at ICA only. ICA is normal past Correlation:                           the stenosis. Anatomy is within normal                                        limits. Bifurcation is located near the                                         Final Interpretation: Right Carotid: Velocities in the right ICA are consistent with a 1-39% stenosis.                Non-hemodynamically significant plaque <50% noted in the CCA. The                ECA appears <50% stenosed. Higher velocities may be obscured by                calcified plaque.                Calcified plaque noted in RIGHT medial subclavian artery with                elevated velocity and turbulence. Left Carotid: Velocities in the left ICA are consistent with a 60-79% stenosis.               Non-hemodynamically significant plaque noted in the CCA. The ECA               appears <50% stenosed. Higher velocities may be obscured by                calcified plaque. Vertebrals:  Bilateral vertebral arteries demonstrate antegrade flow. Subclavians: Right subclavian artery flow was disturbed. Normal flow              hemodynamics were seen in the left subclavian artery. *See table(s) above for measurements and observations.  Electronically signed by Ruta Hinds MD on 06/21/2018 at 8:30:24 AM.    Final     Assessment & Plan:   Jerek was seen today for hypertension, hyperlipidemia and annual exam.  Diagnoses and all orders for this visit:  Routine adult health maintenance  Need for influenza vaccination -     Flu vaccine HIGH DOSE PF (Fluzone High dose)  Essential hypertension- His blood pressure is well controlled.  Electrolytes and renal function are stable.  Will continue the current combination of telmisartan and amlodipine. -     TSH; Future -     Urinalysis, Routine w reflex microscopic; Future -     Comprehensive metabolic panel; Future  B12 deficiency- His H&H, B12 level, and folate levels are all normal now. -     CBC with Differential/Platelet; Future -     Folate; Future -     Vitamin B12; Future  Hyperlipidemia with target LDL less than 70- He has achieved his LDL goal and is doing well on the statin. -     Lipid panel; Future -     TSH; Future  Hyponatremia- His sodium level is stable and he is asymptomatic with respect to this. -     Comprehensive metabolic panel; Future   I am having [name] maintain his loratadine, Turmeric Curcumin, Multiple Vitamins-Minerals (PRESERVISION AREDS 2 PO), aspirin EC, telmisartan, amLODipine, pravastatin, and tadalafil.  No orders of the defined types were placed in this encounter.  See AVS for instructions about healthy living and anticipatory guidance.  Follow-up: Return in about 6 months (around 02/18/2019).  Scarlette Calico, MD

## 2018-08-19 NOTE — Patient Instructions (Signed)

## 2018-08-20 ENCOUNTER — Encounter: Payer: Self-pay | Admitting: Internal Medicine

## 2018-08-20 NOTE — Assessment & Plan Note (Signed)

## 2018-11-12 ENCOUNTER — Ambulatory Visit: Payer: Medicare Other | Admitting: Urology

## 2018-11-28 ENCOUNTER — Other Ambulatory Visit: Payer: Self-pay | Admitting: Internal Medicine

## 2018-11-28 DIAGNOSIS — I10 Essential (primary) hypertension: Secondary | ICD-10-CM

## 2018-11-29 ENCOUNTER — Other Ambulatory Visit: Payer: Self-pay | Admitting: Internal Medicine

## 2018-11-29 DIAGNOSIS — I10 Essential (primary) hypertension: Secondary | ICD-10-CM

## 2018-11-29 DIAGNOSIS — I6523 Occlusion and stenosis of bilateral carotid arteries: Secondary | ICD-10-CM

## 2018-12-23 ENCOUNTER — Other Ambulatory Visit: Payer: Self-pay

## 2018-12-23 DIAGNOSIS — I6523 Occlusion and stenosis of bilateral carotid arteries: Secondary | ICD-10-CM

## 2018-12-23 NOTE — Progress Notes (Signed)
co

## 2018-12-31 ENCOUNTER — Ambulatory Visit: Payer: Medicare Other | Admitting: Family

## 2018-12-31 ENCOUNTER — Encounter (HOSPITAL_COMMUNITY): Payer: Medicare Other

## 2019-05-22 ENCOUNTER — Encounter: Payer: Self-pay | Admitting: Internal Medicine

## 2019-05-22 ENCOUNTER — Other Ambulatory Visit (INDEPENDENT_AMBULATORY_CARE_PROVIDER_SITE_OTHER): Payer: Medicare Other

## 2019-05-22 ENCOUNTER — Other Ambulatory Visit: Payer: Self-pay

## 2019-05-22 ENCOUNTER — Ambulatory Visit (INDEPENDENT_AMBULATORY_CARE_PROVIDER_SITE_OTHER): Payer: Medicare Other | Admitting: Internal Medicine

## 2019-05-22 VITALS — BP 138/78 | HR 66 | Temp 98.1°F | Resp 16 | Ht 67.0 in | Wt 165.0 lb

## 2019-05-22 DIAGNOSIS — I10 Essential (primary) hypertension: Secondary | ICD-10-CM

## 2019-05-22 DIAGNOSIS — N400 Enlarged prostate without lower urinary tract symptoms: Secondary | ICD-10-CM | POA: Diagnosis not present

## 2019-05-22 DIAGNOSIS — E871 Hypo-osmolality and hyponatremia: Secondary | ICD-10-CM

## 2019-05-22 DIAGNOSIS — I6523 Occlusion and stenosis of bilateral carotid arteries: Secondary | ICD-10-CM | POA: Diagnosis not present

## 2019-05-22 LAB — BASIC METABOLIC PANEL
BUN: 18 mg/dL (ref 6–23)
CO2: 26 mEq/L (ref 19–32)
Calcium: 8.8 mg/dL (ref 8.4–10.5)
Chloride: 99 mEq/L (ref 96–112)
Creatinine, Ser: 0.78 mg/dL (ref 0.40–1.50)
GFR: 97.59 mL/min (ref >60.00)
Glucose, Bld: 92 mg/dL (ref 70–99)
Potassium: 4.5 mEq/L (ref 3.5–5.1)
Sodium: 132 mEq/L — ABNORMAL LOW (ref 135–145)

## 2019-05-22 MED ORDER — AMLODIPINE BESYLATE 5 MG PO TABS: 5.0000 mg | ORAL_TABLET | Freq: Every day | ORAL | 1 refills | Status: DC

## 2019-05-22 MED ORDER — TELMISARTAN 40 MG PO TABS: ORAL_TABLET | ORAL | 1 refills | Status: DC

## 2019-05-22 NOTE — Progress Notes (Signed)
Subjective:  Patient ID: Brian Hunt, male    DOB: November 06, 1945  Age: 73 y.o. MRN: [id number]  CC: Hypertension   HPI Brian Hunt presents for f/up - He tells me his blood pressure has been well controlled.  At home his blood pressure was recently 126/78.  He has felt well recently and denies any episodes of dizziness, lightheadedness, fatigue, CP, DOE, palpitations, or edema.  Outpatient Medications Prior to Visit  Medication Sig Dispense Refill   aspirin EC 81 MG tablet Take 81 mg by mouth daily.     Multiple Vitamins-Minerals (PRESERVISION AREDS 2 PO) Take 1 tablet by mouth daily.     pravastatin (PRAVACHOL) 20 MG tablet TAKE 1 TABLET BY MOUTH EVERY DAY 90 tablet 1   tadalafil (CIALIS) 5 MG tablet Take 1 tablet (5 mg total) by mouth daily as needed for erectile dysfunction. 30 tablet 11   amLODipine (NORVASC) 5 MG tablet TAKE 1 TABLET BY MOUTH EVERY DAY 90 tablet 1   loratadine (CLARITIN) 10 MG tablet Take 10 mg by mouth daily as needed for allergies.      telmisartan (MICARDIS) 40 MG tablet TAKE 1 TABLET(40 MG) BY MOUTH DAILY 90 tablet 1   Turmeric Curcumin 500 MG CAPS Take 500 mg by mouth daily.     No facility-administered medications prior to visit.     ROS Review of Systems  Constitutional: Negative for diaphoresis and fatigue.  HENT: Negative.   Eyes: Negative for visual disturbance.  Respiratory: Negative for cough, chest tightness, shortness of breath and wheezing.   Cardiovascular: Negative for chest pain, palpitations and leg swelling.  Gastrointestinal: Negative for abdominal pain, diarrhea, nausea and vomiting.  Endocrine: Negative.   Genitourinary: Negative.  Negative for difficulty urinating.  Musculoskeletal: Negative.  Negative for arthralgias and myalgias.  Skin: Negative for color change and pallor.  Neurological: Negative for dizziness, weakness and light-headedness.  Hematological: Negative for adenopathy. Does not bruise/bleed easily.    Psychiatric/Behavioral: Negative.     Objective:  BP 138/78 (BP Location: Left Arm, Patient Position: Sitting, Cuff Size: Normal)    Pulse 66    Temp 98.1 F (36.7 C) (Oral)    Resp 16    Ht 5\' 7"  (1.702 m)    Wt 165 lb (74.8 kg)    SpO2 97%    BMI 25.84 kg/m   BP Readings from Last 3 Encounters:  05/22/19 138/78  08/19/18 130/70  08/05/18 (!) 160/80    Wt Readings from Last 3 Encounters:  05/22/19 165 lb (74.8 kg)  08/19/18 164 lb (74.4 kg)  08/05/18 163 lb 14.4 oz (74.3 kg)    Physical Exam Vitals signs reviewed.  HENT:     Nose: Nose normal.     Mouth/Throat:     Mouth: Mucous membranes are moist.     Pharynx: No oropharyngeal exudate.  Eyes:     General: No scleral icterus.    Conjunctiva/sclera: Conjunctivae normal.  Neck:     Musculoskeletal: Normal range of motion. No muscular tenderness.  Cardiovascular:     Rate and Rhythm: Normal rate and regular rhythm.     Heart sounds: Murmur present. Systolic murmur present with a grade of 2/6. No gallop.   Pulmonary:     Effort: Pulmonary effort is normal.     Breath sounds: No stridor. No wheezing, rhonchi or rales.  Abdominal:     General: Abdomen is flat. Bowel sounds are normal. There is no distension.     Palpations:  There is no hepatomegaly or splenomegaly.  Musculoskeletal: Normal range of motion.     Right lower leg: No edema.     Left lower leg: No edema.  Lymphadenopathy:     Cervical: No cervical adenopathy.  Skin:    General: Skin is warm and dry.     Coloration: Skin is not pale.  Neurological:     General: No focal deficit present.     Mental Status: He is alert.  Psychiatric:        Mood and Affect: Mood normal.        Behavior: Behavior normal.     Lab Results  Component Value Date   WBC 8.0 08/19/2018   HGB 14.0 08/19/2018   HCT 40.5 08/19/2018   PLT 351.0 08/19/2018   GLUCOSE 92 05/22/2019   CHOL 148 08/19/2018   TRIG 56.0 08/19/2018   HDL 50.20 08/19/2018   LDLCALC 87 08/19/2018    ALT 12 08/19/2018   AST 15 08/19/2018   NA 132 (L) 05/22/2019   K 4.5 05/22/2019   CL 99 05/22/2019   CREATININE 0.78 05/22/2019   BUN 18 05/22/2019   CO2 26 05/22/2019   TSH 3.65 08/19/2018   PSA 3.46 01/22/2018   INR 1.10 09/24/2017   HGBA1C 5.9 05/11/2015    Vas US Carotid  Result Date: 06/21/2018 Carotid Arterial Duplex Study Indications:                           Carotid artery disease. Risk Factors:                          Hypertension, hyperlipidemia, past                                        history of smoking. Comparison Study:                      12/13/2017                                        RT- 1-39% stenosis 74/24                                        LT - 60-79% stenosis 227/76 Pre-Surgical Evaluation & Surgical     Stenosis at ICA only. ICA is normal past Correlation:                           the stenosis. Anatomy is within normal                                        limits. 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 Performing Technologist: Caralee Ates BA, RVT, RDMS  Examination Guidelines: A complete evaluation includes B-mode imaging, spectral  Doppler, color Doppler, and power Doppler as needed of all accessible portions of each vessel. Bilateral testing is considered an integral part of a complete examination. Limited examinations for reoccurring indications may be performed as noted.  Right Carotid Findings: +----------+--------+--------+--------+-------------------------+--------+             PSV cm/s EDV cm/s Stenosis Describe                  Comments  +----------+--------+--------+--------+-------------------------+--------+  CCA Prox   67       14       <50%     heterogenous                        +----------+--------+--------+--------+-------------------------+--------+  CCA Mid    64       15       <50%     heterogenous                        +----------+--------+--------+--------+-------------------------+--------+   CCA Distal 60       14       <50%     heterogenous                        +----------+--------+--------+--------+-------------------------+--------+  ICA Prox   82       26                calcific and heterogenous           +----------+--------+--------+--------+-------------------------+--------+  ICA Mid    96       29       1-39%    calcific and heterogenous           +----------+--------+--------+--------+-------------------------+--------+  ICA Distal 94       32                                                    +----------+--------+--------+--------+-------------------------+--------+  ECA        71       9                 calcific and heterogenous <50%      +----------+--------+--------+--------+-------------------------+--------+ +----------+--------+-------+---------+-------------------+             PSV cm/s EDV cms Describe  Arm Pressure (mmHG)  +----------+--------+-------+---------+-------------------+  Subclavian 217              Turbulent                      +----------+--------+-------+---------+-------------------+ +---------+--------+--+--------+--+---------+  Vertebral PSV cm/s 37 EDV cm/s 13 Antegrade  +---------+--------+--+--------+--+---------+  Left Carotid Findings: +----------+--------+--------+--------+-------------------------+--------+             PSV cm/s EDV cm/s Stenosis Describe                  Comments  +----------+--------+--------+--------+-------------------------+--------+  CCA Prox   84       19       <50%     homogeneous                         +----------+--------+--------+--------+-------------------------+--------+  CCA Mid    74       18       <  50%     homogeneous                         +----------+--------+--------+--------+-------------------------+--------+  CCA Distal 83       21       <50%     homogeneous                         +----------+--------+--------+--------+-------------------------+--------+  ICA Prox   163      72       60-79%   heterogenous and  calcific WITH PST  +----------+--------+--------+--------+-------------------------+--------+  ICA Mid    120      28                                                    +----------+--------+--------+--------+-------------------------+--------+  ICA Distal 79       26                                                    +----------+--------+--------+--------+-------------------------+--------+  ECA        77       9                 calcific and heterogenous <50%      +----------+--------+--------+--------+-------------------------+--------+ +----------+--------+--------+----------------+-------------------+  Subclavian PSV cm/s EDV cm/s Describe         Arm Pressure (mmHG)  +----------+--------+--------+----------------+-------------------+             107               Multiphasic, WNL                      +----------+--------+--------+----------------+-------------------+ +---------+--------+--+--------+--+---------+  Vertebral PSV cm/s 69 EDV cm/s 17 Antegrade  +---------+--------+--+--------+--+---------+  Final Interpretation: Right Carotid: Velocities in the right ICA are consistent with a 1-39% stenosis.                Non-hemodynamically significant plaque <50% noted in the CCA. The                ECA appears <50% stenosed. Higher velocities may be obscured by                calcified plaque.                Calcified plaque noted in RIGHT medial subclavian artery with                elevated velocity and turbulence. Left Carotid: Velocities in the left ICA are consistent with a 60-79% stenosis.               Non-hemodynamically significant plaque noted in the CCA. The ECA               appears <50% stenosed. Higher velocities may be obscured by               calcified plaque. Vertebrals:  Bilateral vertebral arteries demonstrate antegrade flow. Subclavians: Right subclavian artery flow was disturbed. Normal flow              hemodynamics were seen in the left subclavian  artery. *See table(s) above for  measurements and observations.  Electronically signed by Ruta Hinds MD on 06/21/2018 at 8:30:24 AM.    Final     Assessment & Plan:   Abby was seen today for hypertension.  Diagnoses and all orders for this visit:  Essential hypertension- His blood pressure is adequately well controlled.  His electrolytes are stable and renal function is normal.  Will continue the combination of telmisartan and amlodipine. -     Basic metabolic panel; Future -     telmisartan (MICARDIS) 40 MG tablet; One po QD -     amLODipine (NORVASC) 5 MG tablet; Take 1 tablet (5 mg total) by mouth daily.  Benign prostatic hyperplasia without lower urinary tract symptoms- He tells me that he saw his urologist about a month or 2 ago and a PSA was done during that visit.  I do not have a copy of those results. -     Cancel: PSA; Future  Hyponatremia- This is a chronic, stable, asymptomatic finding for him.  Will continue to follow without any intervention at this time. -     Basic metabolic panel; Future  Bilateral carotid artery stenosis -     telmisartan (MICARDIS) 40 MG tablet; One po QD   I have discontinued Brodrick F. Rybolt's loratadine and Turmeric Curcumin. I have also changed his telmisartan and amLODipine. Additionally, I am having him maintain his Multiple Vitamins-Minerals (PRESERVISION AREDS 2 PO), aspirin EC, tadalafil, and pravastatin.  Meds ordered this encounter  Medications   telmisartan (MICARDIS) 40 MG tablet    Sig: One po QD    Dispense:  90 tablet    Refill:  1   amLODipine (NORVASC) 5 MG tablet    Sig: Take 1 tablet (5 mg total) by mouth daily.    Dispense:  90 tablet    Refill:  1     Follow-up: Return in about 6 months (around 11/19/2019).  Scarlette Calico, MD

## 2019-05-22 NOTE — Patient Instructions (Signed)

## 2019-05-28 ENCOUNTER — Other Ambulatory Visit: Payer: Self-pay | Admitting: Internal Medicine

## 2019-05-28 DIAGNOSIS — E785 Hyperlipidemia, unspecified: Secondary | ICD-10-CM

## 2019-05-28 DIAGNOSIS — I6523 Occlusion and stenosis of bilateral carotid arteries: Secondary | ICD-10-CM

## 2019-05-28 MED ORDER — PRAVASTATIN SODIUM 20 MG PO TABS: 20.0000 mg | ORAL_TABLET | Freq: Every day | ORAL | 1 refills | Status: DC

## 2019-06-09 ENCOUNTER — Other Ambulatory Visit: Payer: Self-pay | Admitting: Internal Medicine

## 2019-06-09 DIAGNOSIS — I10 Essential (primary) hypertension: Secondary | ICD-10-CM

## 2019-06-09 MED ORDER — AMLODIPINE BESYLATE 5 MG PO TABS: 5.0000 mg | ORAL_TABLET | Freq: Every day | ORAL | 1 refills | Status: DC

## 2019-08-11 LAB — PSA: PSA: 3.19

## 2019-08-21 ENCOUNTER — Ambulatory Visit (INDEPENDENT_AMBULATORY_CARE_PROVIDER_SITE_OTHER): Payer: Medicare Other | Admitting: Internal Medicine

## 2019-08-21 ENCOUNTER — Other Ambulatory Visit (INDEPENDENT_AMBULATORY_CARE_PROVIDER_SITE_OTHER): Payer: Medicare Other

## 2019-08-21 ENCOUNTER — Encounter: Payer: Self-pay | Admitting: Internal Medicine

## 2019-08-21 ENCOUNTER — Other Ambulatory Visit: Payer: Self-pay

## 2019-08-21 VITALS — BP 138/82 | HR 60 | Temp 98.2°F | Resp 16 | Ht 67.0 in | Wt 164.0 lb

## 2019-08-21 DIAGNOSIS — Z0000 Encounter for general adult medical examination without abnormal findings: Secondary | ICD-10-CM | POA: Diagnosis not present

## 2019-08-21 DIAGNOSIS — I350 Nonrheumatic aortic (valve) stenosis: Secondary | ICD-10-CM

## 2019-08-21 DIAGNOSIS — I10 Essential (primary) hypertension: Secondary | ICD-10-CM

## 2019-08-21 DIAGNOSIS — E871 Hypo-osmolality and hyponatremia: Secondary | ICD-10-CM

## 2019-08-21 DIAGNOSIS — E538 Deficiency of other specified B group vitamins: Secondary | ICD-10-CM | POA: Diagnosis not present

## 2019-08-21 DIAGNOSIS — R918 Other nonspecific abnormal finding of lung field: Secondary | ICD-10-CM

## 2019-08-21 DIAGNOSIS — E785 Hyperlipidemia, unspecified: Secondary | ICD-10-CM

## 2019-08-21 DIAGNOSIS — N400 Enlarged prostate without lower urinary tract symptoms: Secondary | ICD-10-CM | POA: Diagnosis not present

## 2019-08-21 DIAGNOSIS — R911 Solitary pulmonary nodule: Secondary | ICD-10-CM | POA: Insufficient documentation

## 2019-08-21 DIAGNOSIS — Z1211 Encounter for screening for malignant neoplasm of colon: Secondary | ICD-10-CM

## 2019-08-21 LAB — CBC WITH DIFFERENTIAL/PLATELET
Basophils Absolute: 0.1 10*3/uL (ref 0.0–0.1)
Basophils Relative: 1.2 % (ref 0.0–3.0)
Eosinophils Absolute: 0.7 10*3/uL (ref 0.0–0.7)
Eosinophils Relative: 9.1 % — ABNORMAL HIGH (ref 0.0–5.0)
HCT: 41.8 % (ref 39.0–52.0)
Hemoglobin: 14.2 g/dL (ref 13.0–17.0)
Lymphocytes Relative: 21.5 % (ref 12.0–46.0)
Lymphs Abs: 1.6 10*3/uL (ref 0.7–4.0)
MCHC: 34 g/dL (ref 30.0–36.0)
MCV: 96.4 fl (ref 78.0–100.0)
Monocytes Absolute: 0.9 10*3/uL (ref 0.1–1.0)
Monocytes Relative: 12.7 % — ABNORMAL HIGH (ref 3.0–12.0)
Neutro Abs: 4.1 10*3/uL (ref 1.4–7.7)
Neutrophils Relative %: 55.5 % (ref 43.0–77.0)
Platelets: 306 10*3/uL (ref 150.0–400.0)
RBC: 4.33 Mil/uL (ref 4.22–5.81)
RDW: 14 % (ref 11.5–15.5)
WBC: 7.4 10*3/uL (ref 4.0–10.5)

## 2019-08-21 LAB — HEPATIC FUNCTION PANEL
ALT: 15 U/L (ref 0–53)
AST: 17 U/L (ref 0–37)
Albumin: 4 g/dL (ref 3.5–5.2)
Alkaline Phosphatase: 62 U/L (ref 39–117)
Bilirubin, Direct: 0.2 mg/dL (ref 0.0–0.3)
Total Bilirubin: 0.6 mg/dL (ref 0.2–1.2)
Total Protein: 7.7 g/dL (ref 6.0–8.3)

## 2019-08-21 LAB — BASIC METABOLIC PANEL
BUN: 13 mg/dL (ref 6–23)
CO2: 25 mEq/L (ref 19–32)
Calcium: 9.3 mg/dL (ref 8.4–10.5)
Chloride: 96 mEq/L (ref 96–112)
Creatinine, Ser: 0.73 mg/dL (ref 0.40–1.50)
GFR: 105.27 mL/min (ref >60.00)
Glucose, Bld: 89 mg/dL (ref 70–99)
Potassium: 4.3 mEq/L (ref 3.5–5.1)
Sodium: 130 mEq/L — ABNORMAL LOW (ref 135–145)

## 2019-08-21 LAB — URINALYSIS, ROUTINE W REFLEX MICROSCOPIC
Bilirubin Urine: NEGATIVE
Hgb urine dipstick: NEGATIVE
Ketones, ur: NEGATIVE
Leukocytes,Ua: NEGATIVE
Nitrite: NEGATIVE
RBC / HPF: NONE SEEN (ref 0–?)
Specific Gravity, Urine: 1.015 (ref 1.000–1.030)
Total Protein, Urine: NEGATIVE
Urine Glucose: NEGATIVE
Urobilinogen, UA: 0.2 (ref 0.0–1.0)
pH: 7 (ref 5.0–8.0)

## 2019-08-21 LAB — LIPID PANEL
Cholesterol: 143 mg/dL (ref 0–200)
HDL: 51.7 mg/dL (ref >39.00)
LDL Cholesterol: 81 mg/dL (ref 0–99)
NonHDL: 91.43
Total CHOL/HDL Ratio: 3
Triglycerides: 51 mg/dL (ref 0.0–149.0)
VLDL: 10.2 mg/dL (ref 0.0–40.0)

## 2019-08-21 LAB — VITAMIN B12: Vitamin B-12: 311 pg/mL (ref 211–911)

## 2019-08-21 LAB — CORTISOL: Cortisol, Plasma: 8.8 ug/dL

## 2019-08-21 LAB — TSH: TSH: 2.25 u[IU]/mL (ref 0.35–4.50)

## 2019-08-21 LAB — FOLATE: Folate: 12.4 ng/mL (ref >5.9)

## 2019-08-21 NOTE — Progress Notes (Signed)
Subjective:  Patient ID: Brian Hunt, male    DOB: 1946-04-25  Age: 73 y.o. MRN: [id number]  CC: Hypertension and Annual Exam   This visit occurred during the SARS-CoV-2 public health emergency.  Safety protocols were in place, including screening questions prior to the visit, additional usage of staff PPE, and extensive cleaning of exam room while observing appropriate contact time as indicated for disinfecting solutions.    HPI Brian Hunt presents for a CPX.  He tells me he saw his urologist 10 days ago and that his GU and rectal exams were completed.  He is happy to inform me that his PSA was down to 3.19.  He tells me his 79 year old son has been diagnosed with colon cancer and is undergoing chemotherapy.  He tells me his blood pressure has been well controlled.  He denies any recent episodes of chest pain, shortness of breath, dizziness, lightheadedness, near syncope, edema, or fatigue.  Outpatient Medications Prior to Visit  Medication Sig Dispense Refill   amLODipine (NORVASC) 5 MG tablet Take 1 tablet (5 mg total) by mouth daily. 90 tablet 1   aspirin EC 81 MG tablet Take 81 mg by mouth daily.     Multiple Vitamins-Minerals (PRESERVISION AREDS 2 PO) Take 1 tablet by mouth daily.     pravastatin (PRAVACHOL) 20 MG tablet Take 1 tablet (20 mg total) by mouth daily. 90 tablet 1   tadalafil (CIALIS) 5 MG tablet Take 1 tablet (5 mg total) by mouth daily as needed for erectile dysfunction. 30 tablet 11   telmisartan (MICARDIS) 40 MG tablet One po QD 90 tablet 1   No facility-administered medications prior to visit.     ROS Review of Systems  Constitutional: Negative.  Negative for chills, diaphoresis, fatigue, fever and unexpected weight change.  HENT: Negative.   Eyes: Negative for visual disturbance.  Respiratory: Negative for cough, chest tightness, shortness of breath and wheezing.   Cardiovascular: Negative for chest pain, palpitations and leg swelling.    Gastrointestinal: Negative for abdominal pain, blood in stool, diarrhea, nausea and vomiting.  Endocrine: Negative.   Genitourinary: Negative for difficulty urinating.  Musculoskeletal: Negative.  Negative for arthralgias and myalgias.  Skin: Negative.  Negative for color change.  Neurological: Negative.  Negative for dizziness, weakness, light-headedness and numbness.  Hematological: Negative for adenopathy. Does not bruise/bleed easily.  Psychiatric/Behavioral: Negative.     Objective:  BP 138/82 (BP Location: Left Arm, Patient Position: Sitting, Cuff Size: Normal)    Pulse 60    Temp 98.2 F (36.8 C) (Oral)    Resp 16    Ht 5\' 7"  (1.702 m)    Wt 164 lb (74.4 kg)    SpO2 96%    BMI 25.69 kg/m   BP Readings from Last 3 Encounters:  08/21/19 138/82  05/22/19 138/78  08/19/18 130/70    Wt Readings from Last 3 Encounters:  08/21/19 164 lb (74.4 kg)  05/22/19 165 lb (74.8 kg)  08/19/18 164 lb (74.4 kg)    Physical Exam Vitals signs reviewed.  Constitutional:      Appearance: Normal appearance.  HENT:     Nose: Nose normal.     Mouth/Throat:     Mouth: Mucous membranes are moist.  Eyes:     General: No scleral icterus.    Conjunctiva/sclera: Conjunctivae normal.  Neck:     Musculoskeletal: Neck supple.  Cardiovascular:     Rate and Rhythm: Normal rate and regular rhythm.  Pulses:          Carotid pulses are 1+ on the right side and 1+ on the left side.      Radial pulses are 1+ on the right side and 1+ on the left side.       Femoral pulses are 1+ on the right side and 1+ on the left side.      Popliteal pulses are 1+ on the right side and 1+ on the left side.       Dorsalis pedis pulses are 1+ on the right side and 1+ on the left side.       Posterior tibial pulses are 1+ on the right side and 1+ on the left side.     Heart sounds: Murmur present. Decrescendo  systolic murmur present with a grade of 2/6. No diastolic murmur. No gallop. No S3 or S4 sounds.       Comments: EKG ---  Sinus  Rhythm  -With rate variation  cv = 10. -Right bundle branch block.   -Anteroseptal infarct -age undetermined.   ABNORMAL  Pulmonary:     Effort: Pulmonary effort is normal.     Breath sounds: No stridor. No wheezing, rhonchi or rales.  Abdominal:     General: Abdomen is flat. Bowel sounds are normal. There is no distension.     Palpations: Abdomen is soft. There is no hepatomegaly or splenomegaly.     Tenderness: There is no abdominal tenderness.  Genitourinary:    Comments: GU and rectal exams were deferred at his request. Musculoskeletal: Normal range of motion.     Right lower leg: No edema.     Left lower leg: No edema.  Lymphadenopathy:     Cervical: No cervical adenopathy.  Skin:    General: Skin is warm and dry.  Neurological:     General: No focal deficit present.     Mental Status: He is alert.  Psychiatric:        Mood and Affect: Mood normal.        Behavior: Behavior normal.     Lab Results  Component Value Date   WBC 7.4 08/21/2019   HGB 14.2 08/21/2019   HCT 41.8 08/21/2019   PLT 306.0 08/21/2019   GLUCOSE 89 08/21/2019   CHOL 143 08/21/2019   TRIG 51.0 08/21/2019   HDL 51.70 08/21/2019   LDLCALC 81 08/21/2019   ALT 15 08/21/2019   AST 17 08/21/2019   NA 130 (L) 08/21/2019   K 4.3 08/21/2019   CL 96 08/21/2019   CREATININE 0.73 08/21/2019   BUN 13 08/21/2019   CO2 25 08/21/2019   TSH 2.25 08/21/2019   PSA 3.19 08/11/2019   INR 1.10 09/24/2017   HGBA1C 5.9 05/11/2015    Vas US Carotid  Result Date: 06/21/2018 Carotid Arterial Duplex Study Indications:                           Carotid artery disease. Risk Factors:                          Hypertension, hyperlipidemia, past                                        history of smoking. Comparison Study:  12/13/2017                                        RT- 1-39% stenosis 74/24                                        LT - 60-79% stenosis 227/76  Pre-Surgical Evaluation & Surgical     Stenosis at ICA only. ICA is normal past Correlation:                           the stenosis. Anatomy is within normal                                        limits. 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 Performing Technologist: Caralee Ates BA, RVT, RDMS  Examination Guidelines: A complete evaluation includes B-mode imaging, spectral Doppler, color Doppler, and power Doppler as needed of all accessible portions of each vessel. Bilateral testing is considered an integral part of a complete examination. Limited examinations for reoccurring indications may be performed as noted.  Right Carotid Findings: +----------+--------+--------+--------+-------------------------+--------+             PSV cm/s EDV cm/s Stenosis Describe                  Comments  +----------+--------+--------+--------+-------------------------+--------+  CCA Prox   67       14       <50%     heterogenous                        +----------+--------+--------+--------+-------------------------+--------+  CCA Mid    64       15       <50%     heterogenous                        +----------+--------+--------+--------+-------------------------+--------+  CCA Distal 60       14       <50%     heterogenous                        +----------+--------+--------+--------+-------------------------+--------+  ICA Prox   82       26                calcific and heterogenous           +----------+--------+--------+--------+-------------------------+--------+  ICA Mid    96       29       1-39%    calcific and heterogenous           +----------+--------+--------+--------+-------------------------+--------+  ICA Distal 94       32                                                    +----------+--------+--------+--------+-------------------------+--------+  ECA        71       9                 calcific and heterogenous <50%       +----------+--------+--------+--------+-------------------------+--------+ +----------+--------+-------+---------+-------------------+             PSV cm/s EDV cms Describe  Arm Pressure (mmHG)  +----------+--------+-------+---------+-------------------+  Subclavian 217              Turbulent                      +----------+--------+-------+---------+-------------------+ +---------+--------+--+--------+--+---------+  Vertebral PSV cm/s 37 EDV cm/s 13 Antegrade  +---------+--------+--+--------+--+---------+  Left Carotid Findings: +----------+--------+--------+--------+-------------------------+--------+             PSV cm/s EDV cm/s Stenosis Describe                  Comments  +----------+--------+--------+--------+-------------------------+--------+  CCA Prox   84       19       <50%     homogeneous                         +----------+--------+--------+--------+-------------------------+--------+  CCA Mid    74       18       <50%     homogeneous                         +----------+--------+--------+--------+-------------------------+--------+  CCA Distal 83       21       <50%     homogeneous                         +----------+--------+--------+--------+-------------------------+--------+  ICA Prox   163      72       60-79%   heterogenous and calcific WITH PST  +----------+--------+--------+--------+-------------------------+--------+  ICA Mid    120      28                                                    +----------+--------+--------+--------+-------------------------+--------+  ICA Distal 79       26                                                    +----------+--------+--------+--------+-------------------------+--------+  ECA        77       9                 calcific and heterogenous <50%      +----------+--------+--------+--------+-------------------------+--------+ +----------+--------+--------+----------------+-------------------+  Subclavian PSV cm/s EDV cm/s Describe         Arm Pressure (mmHG)   +----------+--------+--------+----------------+-------------------+             107               Multiphasic, WNL                      +----------+--------+--------+----------------+-------------------+ +---------+--------+--+--------+--+---------+  Vertebral PSV cm/s 69 EDV cm/s 17 Antegrade  +---------+--------+--+--------+--+---------+  Final Interpretation: Right Carotid: Velocities  in the right ICA are consistent with a 1-39% stenosis.                Non-hemodynamically significant plaque <50% noted in the CCA. The                ECA appears <50% stenosed. Higher velocities may be obscured by                calcified plaque.                Calcified plaque noted in RIGHT medial subclavian artery with                elevated velocity and turbulence. Left Carotid: Velocities in the left ICA are consistent with a 60-79% stenosis.               Non-hemodynamically significant plaque noted in the CCA. The ECA               appears <50% stenosed. Higher velocities may be obscured by               calcified plaque. Vertebrals:  Bilateral vertebral arteries demonstrate antegrade flow. Subclavians: Right subclavian artery flow was disturbed. Normal flow              hemodynamics were seen in the left subclavian artery. *See table(s) above for measurements and observations.  Electronically signed by Ruta Hinds MD on 06/21/2018 at 8:30:24 AM.    Final     Assessment & Plan:   Brian Hunt was seen today for hypertension and annual exam.  Diagnoses and all orders for this visit:  Essential hypertension- His blood pressure is adequately well controlled.  His sodium was down to 130 but he is not taking a diuretic.  We will continue the combination of a calcium channel blocker and ARB. -     Basic metabolic panel; Future -     Cortisol; Future -     TSH; Future -     Urinalysis, Routine w reflex microscopic; Future -     Sodium, urine, random; Future -     Chloride, urine, random  Benign prostatic hyperplasia  without lower urinary tract symptoms -     Cancel: PSA; Future  B12 deficiency- His B12 and folate levels are normal now with no supplementation. -     CBC with Differential; Future -     B12; Future -     Folate; Future  Routine general medical examination at a health care facility- Exam completed, labs reviewed, vaccines reviewed and updated, he is referred for colon cancer screening, patient education material was given.  Hyperlipidemia with target LDL less than 70- He has achieved his LDL goal and is doing well on the statin. -     Lipid panel; Future -     Hepatic function panel; Future -     TSH; Future  Hyponatremia- This is a chronic finding and he is asymptomatic with respect to this.  His cortisol level is normal.  I recommended that he undergo imaging of his chest to evaluate for SIADH related to a pulmonary lesion. -     Cortisol; Future -     Sodium, urine, random; Future -     Chloride, urine, random -     CT Chest Wo Contrast; Future  Multiple lung nodules on CT -     CT Chest Wo Contrast; Future  Abnormal findings on diagnostic imaging of lung -  CT Chest Wo Contrast; Future  Screening for colon cancer -     Ambulatory referral to Gastroenterology  Aortic stenosis, mild- This is mild and he is asymptomatic.  His EKG is unchanged.  No further treatment or evaluation is needed at this time. -     EKG 12-Lead   I am having [name] maintain his Multiple Vitamins-Minerals (PRESERVISION AREDS 2 PO), aspirin EC, tadalafil, telmisartan, pravastatin, and amLODipine.  No orders of the defined types were placed in this encounter.    Follow-up: Return in about 6 months (around 02/19/2020).  Scarlette Calico, MD

## 2019-08-21 NOTE — Patient Instructions (Signed)

## 2019-08-22 LAB — SODIUM, URINE, RANDOM: Sodium, Ur: 50 mmol/L (ref 28–272)

## 2019-09-02 ENCOUNTER — Ambulatory Visit (INDEPENDENT_AMBULATORY_CARE_PROVIDER_SITE_OTHER)
Admission: RE | Admit: 2019-09-02 | Discharge: 2019-09-02 | Disposition: A | Discharge status: Home / Self Care | Payer: Medicare Other | Source: Ambulatory Visit | Attending: Internal Medicine | Admitting: Internal Medicine

## 2019-09-02 ENCOUNTER — Other Ambulatory Visit: Payer: Self-pay

## 2019-09-02 DIAGNOSIS — E871 Hypo-osmolality and hyponatremia: Secondary | ICD-10-CM

## 2019-09-02 DIAGNOSIS — R918 Other nonspecific abnormal finding of lung field: Secondary | ICD-10-CM | POA: Diagnosis not present

## 2019-09-03 ENCOUNTER — Encounter: Payer: Self-pay | Admitting: Internal Medicine

## 2019-09-04 ENCOUNTER — Other Ambulatory Visit: Payer: Self-pay | Admitting: Internal Medicine

## 2019-09-10 ENCOUNTER — Encounter: Payer: Self-pay | Admitting: Internal Medicine

## 2019-09-29 DIAGNOSIS — H35033 Hypertensive retinopathy, bilateral: Secondary | ICD-10-CM | POA: Diagnosis not present

## 2019-09-29 DIAGNOSIS — H353132 Nonexudative age-related macular degeneration, bilateral, intermediate dry stage: Secondary | ICD-10-CM | POA: Diagnosis not present

## 2019-09-29 DIAGNOSIS — H43393 Other vitreous opacities, bilateral: Secondary | ICD-10-CM | POA: Diagnosis not present

## 2019-09-29 DIAGNOSIS — H2513 Age-related nuclear cataract, bilateral: Secondary | ICD-10-CM | POA: Diagnosis not present

## 2019-10-06 ENCOUNTER — Encounter: Payer: Self-pay | Admitting: Internal Medicine

## 2019-10-08 ENCOUNTER — Ambulatory Visit: Payer: Medicare PPO | Attending: Internal Medicine

## 2019-10-08 DIAGNOSIS — Z23 Encounter for immunization: Secondary | ICD-10-CM

## 2019-10-08 NOTE — Progress Notes (Signed)
   Covid-19 Vaccination Clinic  Name:  Brian Hunt    MRN: [id number] DOB: 04/08/1946  10/08/2019  [name] was observed post Covid-19 immunization for 15 minutes without incidence. He was provided with Vaccine Information Sheet and instruction to access the V-Safe system.   [name] was instructed to call 911 with any severe reactions post vaccine: Marland Kitchen Difficulty breathing  . Swelling of your face and throat  . A fast heartbeat  . A bad rash all over your body  . Dizziness and weakness    Immunizations Administered    Name Date Dose VIS Date Route   Pfizer COVID-19 Vaccine 10/08/2019  6:01 PM 0.3 mL 08/29/2019 Intramuscular   Manufacturer: [place]   Lot: [id number]   [place]: [id number]

## 2019-10-29 ENCOUNTER — Ambulatory Visit: Payer: Medicare PPO | Attending: Internal Medicine

## 2019-10-29 DIAGNOSIS — Z23 Encounter for immunization: Secondary | ICD-10-CM | POA: Insufficient documentation

## 2019-10-29 NOTE — Progress Notes (Signed)
   Covid-19 Vaccination Clinic  Name:  MAKYE CENTANNI    MRN: [id number] DOB: 07-08-1946  10/29/2019  Mr. Gittens was observed post Covid-19 immunization for 15 minutes without incidence. He was provided with Vaccine Information Sheet and instruction to access the V-Safe system.   Mr. Vandersloot was instructed to call 911 with any severe reactions post vaccine: Marland Kitchen Difficulty breathing  . Swelling of your face and throat  . A fast heartbeat  . A bad rash all over your body  . Dizziness and weakness    Immunizations Administered    Name Date Dose VIS Date Route   Pfizer COVID-19 Vaccine 10/29/2019  4:58 PM 0.3 mL 08/29/2019 Intramuscular   Manufacturer: [place]   Lot: [id number]   [place]: [id number]

## 2019-11-23 ENCOUNTER — Other Ambulatory Visit: Payer: Self-pay | Admitting: Internal Medicine

## 2019-11-23 DIAGNOSIS — I6523 Occlusion and stenosis of bilateral carotid arteries: Secondary | ICD-10-CM

## 2019-11-23 DIAGNOSIS — E785 Hyperlipidemia, unspecified: Secondary | ICD-10-CM

## 2019-11-24 DIAGNOSIS — Z85828 Personal history of other malignant neoplasm of skin: Secondary | ICD-10-CM | POA: Diagnosis not present

## 2019-11-24 DIAGNOSIS — L308 Other specified dermatitis: Secondary | ICD-10-CM | POA: Diagnosis not present

## 2019-11-26 ENCOUNTER — Other Ambulatory Visit: Payer: Self-pay | Admitting: Internal Medicine

## 2019-11-26 DIAGNOSIS — I6523 Occlusion and stenosis of bilateral carotid arteries: Secondary | ICD-10-CM

## 2019-11-26 DIAGNOSIS — I10 Essential (primary) hypertension: Secondary | ICD-10-CM

## 2019-12-31 DIAGNOSIS — H6123 Impacted cerumen, bilateral: Secondary | ICD-10-CM | POA: Diagnosis not present

## 2020-02-19 ENCOUNTER — Other Ambulatory Visit: Payer: Self-pay

## 2020-02-19 ENCOUNTER — Ambulatory Visit: Payer: Medicare PPO | Admitting: Internal Medicine

## 2020-02-19 ENCOUNTER — Encounter: Payer: Self-pay | Admitting: Internal Medicine

## 2020-02-19 ENCOUNTER — Ambulatory Visit: Payer: Medicare PPO

## 2020-02-19 VITALS — BP 140/76 | HR 67 | Temp 98.1°F | Resp 16 | Ht 67.0 in | Wt 162.0 lb

## 2020-02-19 DIAGNOSIS — E538 Deficiency of other specified B group vitamins: Secondary | ICD-10-CM | POA: Diagnosis not present

## 2020-02-19 DIAGNOSIS — I10 Essential (primary) hypertension: Secondary | ICD-10-CM | POA: Diagnosis not present

## 2020-02-19 DIAGNOSIS — E785 Hyperlipidemia, unspecified: Secondary | ICD-10-CM

## 2020-02-19 DIAGNOSIS — E871 Hypo-osmolality and hyponatremia: Secondary | ICD-10-CM | POA: Diagnosis not present

## 2020-02-19 DIAGNOSIS — Z1211 Encounter for screening for malignant neoplasm of colon: Secondary | ICD-10-CM

## 2020-02-19 DIAGNOSIS — E222 Syndrome of inappropriate secretion of antidiuretic hormone: Secondary | ICD-10-CM | POA: Diagnosis not present

## 2020-02-19 LAB — CBC WITH DIFFERENTIAL/PLATELET
Basophils Absolute: 0.1 10*3/uL (ref 0.0–0.1)
Basophils Relative: 1 % (ref 0.0–3.0)
Eosinophils Absolute: 0.4 10*3/uL (ref 0.0–0.7)
Eosinophils Relative: 6.2 % — ABNORMAL HIGH (ref 0.0–5.0)
HCT: 40.6 % (ref 39.0–52.0)
Hemoglobin: 13.8 g/dL (ref 13.0–17.0)
Lymphocytes Relative: 22.4 % (ref 12.0–46.0)
Lymphs Abs: 1.5 10*3/uL (ref 0.7–4.0)
MCHC: 33.9 g/dL (ref 30.0–36.0)
MCV: 97.9 fl (ref 78.0–100.0)
Monocytes Absolute: 0.9 10*3/uL (ref 0.1–1.0)
Monocytes Relative: 13.9 % — ABNORMAL HIGH (ref 3.0–12.0)
Neutro Abs: 3.7 10*3/uL (ref 1.4–7.7)
Neutrophils Relative %: 56.5 % (ref 43.0–77.0)
Platelets: 302 10*3/uL (ref 150.0–400.0)
RBC: 4.15 Mil/uL — ABNORMAL LOW (ref 4.22–5.81)
RDW: 13.4 % (ref 11.5–15.5)
WBC: 6.5 10*3/uL (ref 4.0–10.5)

## 2020-02-19 LAB — BASIC METABOLIC PANEL
BUN: 16 mg/dL (ref 6–23)
CO2: 29 mEq/L (ref 19–32)
Calcium: 9.3 mg/dL (ref 8.4–10.5)
Chloride: 96 mEq/L (ref 96–112)
Creatinine, Ser: 0.78 mg/dL (ref 0.40–1.50)
GFR: 97.39 mL/min (ref >60.00)
Glucose, Bld: 97 mg/dL (ref 70–99)
Potassium: 4.5 mEq/L (ref 3.5–5.1)
Sodium: 129 mEq/L — ABNORMAL LOW (ref 135–145)

## 2020-02-19 LAB — CK: Total CK: 118 U/L (ref 7–232)

## 2020-02-19 NOTE — Patient Instructions (Signed)

## 2020-02-19 NOTE — Progress Notes (Addendum)
Subjective:  Patient ID: Brian Hunt, male    DOB: [birth year]  Age: 74 y.o. MRN: [id number]  CC: Hyperlipidemia and Hypertension  This visit occurred during the SARS-CoV-2 public health emergency.  Safety protocols were in place, including screening questions prior to the visit, additional usage of staff PPE, and extensive cleaning of exam room while observing appropriate contact time as indicated for disinfecting solutions.   HPI Brian Hunt presents for f/up - He complains of severe muscle cramps that occur at rest and with activity.  He tells me he is no longer willing to take a statin.  He tells me his blood pressure has been well controlled.  He is active and denies any recent episodes of headache, blurred vision, chest pain, shortness of breath, diaphoresis, dizziness, or lightheadedness.  Outpatient Medications Prior to Visit  Medication Sig Dispense Refill  . amLODipine (NORVASC) 5 MG tablet Take 1 tablet (5 mg total) by mouth daily. 90 tablet 1  . aspirin EC 81 MG tablet Take 81 mg by mouth daily.    . Multiple Vitamins-Minerals (PRESERVISION AREDS 2 PO) Take 1 tablet by mouth daily.    . tadalafil (CIALIS) 5 MG tablet Take 1 tablet (5 mg total) by mouth daily as needed for erectile dysfunction. 30 tablet 11  . telmisartan (MICARDIS) 40 MG tablet TAKE 1 TABLET BY MOUTH EVERY DAY 90 tablet 1  . pravastatin (PRAVACHOL) 20 MG tablet TAKE 1 TABLET(20 MG) BY MOUTH DAILY 90 tablet 1   No facility-administered medications prior to visit.    ROS Review of Systems  Constitutional: Negative.  Negative for chills, diaphoresis, fatigue and fever.  HENT: Negative.   Eyes: Negative for visual disturbance.  Respiratory: Negative for cough, chest tightness, shortness of breath and wheezing.   Cardiovascular: Negative for chest pain, palpitations and leg swelling.  Gastrointestinal: Negative for abdominal pain, constipation, diarrhea, nausea and vomiting.  Endocrine: Negative.     Genitourinary: Negative for difficulty urinating, dysuria and hematuria.  Musculoskeletal: Positive for myalgias. Negative for arthralgias, back pain and neck pain.  Skin: Negative.  Negative for color change.  Neurological: Negative.  Negative for dizziness, weakness, light-headedness and numbness.  Hematological: Negative for adenopathy. Does not bruise/bleed easily.  Psychiatric/Behavioral: Negative.     Objective:  BP 140/76 (BP Location: Left Arm, Patient Position: Sitting, Cuff Size: Large)   Pulse 67   Temp 98.1 F (36.7 C) (Oral)   Resp 16   Ht 5\' 7"  (1.702 m)   Wt 162 lb (73.5 kg)   SpO2 97%   BMI 25.37 kg/m   BP Readings from Last 3 Encounters:  02/19/20 140/76  08/21/19 138/82  05/22/19 138/78    Wt Readings from Last 3 Encounters:  02/19/20 162 lb (73.5 kg)  08/21/19 164 lb (74.4 kg)  05/22/19 165 lb (74.8 kg)    Physical Exam Vitals reviewed.  Constitutional:      Appearance: Normal appearance.  HENT:     Mouth/Throat:     Mouth: Mucous membranes are moist.  Eyes:     General: No scleral icterus.    Conjunctiva/sclera: Conjunctivae normal.  Cardiovascular:     Rate and Rhythm: Normal rate and regular rhythm.     Pulses:          Carotid pulses are 1+ on the right side and 1+ on the left side.      Radial pulses are 1+ on the right side and 1+ on the left side.  Femoral pulses are 1+ on the right side and 1+ on the left side.      Popliteal pulses are 1+ on the right side and 1+ on the left side.       Dorsalis pedis pulses are 1+ on the right side and 1+ on the left side.       Posterior tibial pulses are 1+ on the right side and 1+ on the left side.     Heart sounds: Murmur present. Systolic murmur present with a grade of 1/6. No diastolic murmur.  Pulmonary:     Breath sounds: No stridor. No wheezing, rhonchi or rales.  Abdominal:     General: Abdomen is flat. Bowel sounds are normal. There is no distension.     Palpations: Abdomen is  soft. There is no hepatomegaly, splenomegaly or mass.     Tenderness: There is no abdominal tenderness.  Musculoskeletal:        General: Normal range of motion.     Cervical back: Neck supple.     Right lower leg: No edema.     Left lower leg: No edema.  Lymphadenopathy:     Cervical: No cervical adenopathy.  Neurological:     General: No focal deficit present.     Mental Status: He is alert.  Psychiatric:        Mood and Affect: Mood normal.        Behavior: Behavior normal.     Lab Results  Component Value Date   WBC 6.5 02/19/2020   HGB 13.8 02/19/2020   HCT 40.6 02/19/2020   PLT 302.0 02/19/2020   GLUCOSE 97 02/19/2020   CHOL 143 08/21/2019   TRIG 51.0 08/21/2019   HDL 51.70 08/21/2019   LDLCALC 81 08/21/2019   ALT 15 08/21/2019   AST 17 08/21/2019   NA 129 (L) 02/19/2020   K 4.5 02/19/2020   CL 96 02/19/2020   CREATININE 0.78 02/19/2020   BUN 16 02/19/2020   CO2 29 02/19/2020   TSH 2.25 08/21/2019   PSA 3.19 08/11/2019   INR 1.10 09/24/2017   HGBA1C 5.9 05/11/2015    CT Chest Wo Contrast  Result Date: 09/03/2019 CLINICAL DATA:  Followup pulmonary nodules. EXAM: CT CHEST WITHOUT CONTRAST TECHNIQUE: Multidetector CT imaging of the chest was performed following the standard protocol without IV contrast. COMPARISON:  CT scan 04/07/2011 FINDINGS: Cardiovascular: The heart is normal in size. No pericardial effusion. Advanced atherosclerotic calcifications involving the thoracic aorta and branch vessels including extensive three-vessel coronary artery calcifications. There are also calcifications along the aortic valve and at the mitral valve annulus. Mediastinum/Nodes: No mediastinal or hilar mass or adenopathy. The esophagus is grossly normal. Lungs/Pleura: Advanced emphysematous changes and areas of pulmonary scarring. No acute overlying pulmonary process. Stable 4 mm right upper lobe pulmonary nodule on image 49/3. Stable nodules along both major fissures consistent  with benign lymph nodes. Few tiny subpleural pulmonary nodules are noted elsewhere. No worrisome pulmonary lesions or new pulmonary nodules. No pleural effusion or pleural nodules. Upper Abdomen: No significant upper abdominal findings. Stable mild nodularity of both adrenal glands. Stable upper abdominal aortic calcifications. Musculoskeletal: No chest wall mass, supraclavicular or axillary adenopathy the. 18 mm right thyroid nodule is unchanged since 2012 and considered benign. No significant bony findings. IMPRESSION: 1. Advanced emphysematous changes but no acute overlying pulmonary process. 2. Stable scattered subpleural and perifissural nodules consistent with benign lymph nodes. No new pulmonary nodules or worrisome pulmonary lesions. 3. Advanced atherosclerotic calcifications involving  the thoracic aorta and branch vessels including significant three-vessel coronary artery calcifications. 4. 18 mm right thyroid lobe nodule, unchanged since 2012 and considered benign. No follow-up recommended unless clinically warranted (ref: J Am Coll Radiol. 2015 Feb;12(2): 143-50). Aortic Atherosclerosis (ICD10-I70.0) and Emphysema (ICD10-J43.9). Electronically Signed   By: Marijo Sanes M.D.   On: 09/03/2019 08:52    Assessment & Plan:   Ndrew was seen today for hyperlipidemia and hypertension.  Diagnoses and all orders for this visit:  Essential hypertension- His blood pressure is adequately well controlled. -     Basic metabolic panel; Future -     Basic metabolic panel  123456 deficiency- Will continue B12 replacement therapy. -     CBC with Differential/Platelet; Future -     CBC with Differential/Platelet  Hyperlipidemia with target LDL less than 70- He complains of myalgias but his CPK is normal.  I recommended that he stop taking the statin for the next few months to see if his symptoms improve. -     CK; Future -     CK  Hyponatremia- His sodium level is down to 129 and he is experiencing muscle  cramps.  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 -     Basic metabolic panel; Future -     Potassium, urine, random; Future -     Potassium, urine, random -     Basic metabolic panel  Screening for colon cancer -     Ambulatory referral to Gastroenterology  SIADH (syndrome of inappropriate ADH production) (HCC) -     demeclocycline (DECLOMYCIN) 150 MG tablet; Take 1 tablet (150 mg total) by mouth 2 (two) times daily.   I have discontinued Armor Baum. Demartin's pravastatin. I am also having him start on demeclocycline. Additionally, I am having him maintain his Multiple Vitamins-Minerals (PRESERVISION AREDS 2 PO), aspirin EC, tadalafil, amLODipine, and telmisartan.  Meds ordered this encounter  Medications  . demeclocycline (DECLOMYCIN) 150 MG tablet    Sig: Take 1 tablet (150 mg total) by mouth 2 (two) times daily.    Dispense:  180 tablet    Refill:  0   I spent 50 minutes in preparing to see the patient by review of recent labs, imaging and procedures, obtaining and reviewing separately obtained history, communicating with the patient and family or caregiver, ordering medications, tests or procedures, and documenting clinical information in the EHR including the differential Dx, treatment, and any further evaluation and other management of 1. Essential hypertension 2. B12 deficiency 3. Hyperlipidemia with target LDL less than 70 4. Hyponatremia 5. SIADH (syndrome of inappropriate ADH production) ([place])    Follow-up: Return in about 3 months (around 05/21/2020).  Scarlette Calico, MD

## 2020-02-20 ENCOUNTER — Encounter: Payer: Self-pay | Admitting: Internal Medicine

## 2020-02-20 DIAGNOSIS — E222 Syndrome of inappropriate secretion of antidiuretic hormone: Secondary | ICD-10-CM | POA: Insufficient documentation

## 2020-02-20 LAB — POTASSIUM, URINE, RANDOM: Potassium Urine: 37 mmol/L (ref 12–129)

## 2020-02-20 MED ORDER — DEMECLOCYCLINE HCL 150 MG PO TABS: 150.0000 mg | ORAL_TABLET | Freq: Two times a day (BID) | ORAL | 0 refills | Status: DC

## 2020-02-27 ENCOUNTER — Other Ambulatory Visit: Payer: Self-pay | Admitting: Internal Medicine

## 2020-02-27 DIAGNOSIS — I10 Essential (primary) hypertension: Secondary | ICD-10-CM

## 2020-04-05 ENCOUNTER — Telehealth: Payer: Self-pay

## 2020-04-05 NOTE — Telephone Encounter (Signed)
Brian Hunt  Please review Echo from 2019.  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    Please advise-thanks.

## 2020-04-05 NOTE — Telephone Encounter (Signed)
Terri,  I have reviewed this pt's echocardigram and , as you noted. he has mild AS.  The standard for exclusion from [place] is severe AS.  He is cleared for anesthetic care at [facility].  Thanks,  Osvaldo Angst

## 2020-04-14 ENCOUNTER — Other Ambulatory Visit: Payer: Self-pay

## 2020-04-14 ENCOUNTER — Ambulatory Visit (AMBULATORY_SURGERY_CENTER): Payer: Self-pay | Admitting: *Deleted

## 2020-04-14 VITALS — Ht 67.0 in | Wt 160.0 lb

## 2020-04-14 DIAGNOSIS — Z86010 Personal history of colonic polyps: Secondary | ICD-10-CM

## 2020-04-14 MED ORDER — SUTAB 1479-225-188 MG PO TABS: 1.0000 | ORAL_TABLET | Freq: Once | ORAL | 0 refills | Status: AC

## 2020-04-14 NOTE — Progress Notes (Signed)

## 2020-04-16 ENCOUNTER — Encounter: Payer: Self-pay | Admitting: Internal Medicine

## 2020-05-12 ENCOUNTER — Encounter: Payer: Self-pay | Admitting: Internal Medicine

## 2020-05-12 ENCOUNTER — Ambulatory Visit (AMBULATORY_SURGERY_CENTER): Payer: Medicare PPO | Admitting: Internal Medicine

## 2020-05-12 ENCOUNTER — Other Ambulatory Visit: Payer: Self-pay

## 2020-05-12 VITALS — BP 143/68 | HR 56 | Temp 96.9°F | Resp 17 | Ht 67.0 in | Wt 160.0 lb

## 2020-05-12 DIAGNOSIS — Z800 Family history of malignant neoplasm of digestive organs: Secondary | ICD-10-CM | POA: Diagnosis not present

## 2020-05-12 DIAGNOSIS — D122 Benign neoplasm of ascending colon: Secondary | ICD-10-CM

## 2020-05-12 DIAGNOSIS — Z1211 Encounter for screening for malignant neoplasm of colon: Secondary | ICD-10-CM

## 2020-05-12 LAB — HM COLONOSCOPY

## 2020-05-12 MED ORDER — SODIUM CHLORIDE 0.9 % IV SOLN: 500.0000 mL | Freq: Once | INTRAVENOUS | Status: DC

## 2020-05-12 NOTE — Patient Instructions (Signed)
YOU HAD AN ENDOSCOPIC PROCEDURE TODAY AT THE [facility]:   Refer to the procedure report that was given to you for any specific questions about what was found during the examination.  If the procedure report does not answer your questions, please call your gastroenterologist to clarify.  If you requested that your care partner not be given the details of your procedure findings, then the procedure report has been included in a sealed envelope for you to review at your convenience later.  YOU SHOULD EXPECT: Some feelings of bloating in the abdomen. Passage of more gas than usual.  Walking can help get rid of the air that was put into your GI tract during the procedure and reduce the bloating. If you had a lower endoscopy (such as a colonoscopy or flexible sigmoidoscopy) you may notice spotting of blood in your stool or on the toilet paper. If you underwent a bowel prep for your procedure, you may not have a normal bowel movement for a few days.  Please Note:  You might notice some irritation and congestion in your nose or some drainage.  This is from the oxygen used during your procedure.  There is no need for concern and it should clear up in a day or so.  SYMPTOMS TO REPORT IMMEDIATELY:   Following lower endoscopy (colonoscopy or flexible sigmoidoscopy):  Excessive amounts of blood in the stool  Significant tenderness or worsening of abdominal pains  Swelling of the abdomen that is new, acute  Fever of 100F or higher   For urgent or emergent issues, a gastroenterologist can be reached at any hour by calling [phone]. Do not use MyChart messaging for urgent concerns.    DIET:  We do recommend a small meal at first, but then you may proceed to your regular diet.  Drink plenty of fluids but you should avoid alcoholic beverages for 24 hours.  MEDICATIONS: Continue present medications.  Please see handouts given to you by your recovery nurse.  ACTIVITY:  You should plan to  take it easy for the rest of today and you should NOT DRIVE or use heavy machinery until tomorrow (because of the sedation medicines used during the test).    FOLLOW UP: Our staff will call the number listed on your records 48-72 hours following your procedure to check on you and address any questions or concerns that you may have regarding the information given to you following your procedure. If we do not reach you, we will leave a message.  We will attempt to reach you two times.  During this call, we will ask if you have developed any symptoms of COVID 19. If you develop any symptoms (ie: fever, flu-like symptoms, shortness of breath, cough etc.) before then, please call [phone].  If you test positive for Covid 19 in the 2 weeks post procedure, please call and report this information to us.    If any biopsies were taken you will be contacted by phone or by letter within the next 1-3 weeks.  Please call us at [phone] if you have not heard about the biopsies in 3 weeks.   Thank you for allowing us to provide for your healthcare needs today.   SIGNATURES/CONFIDENTIALITY: You and/or your care partner have signed paperwork which will be entered into your electronic medical record.  These signatures attest to the fact that that the information above on your After Visit Summary has been reviewed and is understood.  Full responsibility of the   confidentiality of this discharge information lies with you and/or your care-partner. 

## 2020-05-12 NOTE — Progress Notes (Signed)
PT taken to PACU. Monitors in place. VSS. Report given to RN. 

## 2020-05-12 NOTE — Progress Notes (Signed)
Pt's states no medical or surgical changes since previsit or office visit.  [place] in CW - vs

## 2020-05-12 NOTE — Op Note (Signed)
[place] Patient Name: Brian Hunt Procedure Date: 05/12/2020 9:50 AM MRN: [id number] Endoscopist: Docia Chuck. Henrene Pastor , MD Age: 74 Referring MD:  Date of Birth: 05-Jul-1946 Gender: Male Account #: [id number] Procedure:                Colonoscopy with cold snare polypectomy x 1 Indications:              Screening in patient at increased risk: Colorectal                            cancer in child before age 8 (son at age 76).                            Prior examination elsewhere 2010 was negative for                            neoplasia Medicines:                Monitored Anesthesia Care Procedure:                Pre-Anesthesia Assessment:                           - Prior to the procedure, a History and Physical                            was performed, and patient medications and                            allergies were reviewed. The patient's tolerance of                            previous anesthesia was also reviewed. The risks                            and benefits of the procedure and the sedation                            options and risks were discussed with the patient.                            All questions were answered, and informed consent                            was obtained. Prior Anticoagulants: The patient has                            taken no previous anticoagulant or antiplatelet                            agents. ASA Grade Assessment: II - A patient with                            mild systemic disease. After reviewing the risks  and benefits, the patient was deemed in                            satisfactory condition to undergo the procedure.                           After obtaining informed consent, the colonoscope                            was passed under direct vision. Throughout the                            procedure, the patient's blood pressure, pulse, and                            oxygen saturations were  monitored continuously. The                            Colonoscope was introduced through the anus and                            advanced to the the cecum, identified by                            appendiceal orifice and ileocecal valve. The                            ileocecal valve, appendiceal orifice, and rectum                            were photographed. The quality of the bowel                            preparation was good. The colonoscopy was performed                            without difficulty. The patient tolerated the                            procedure well. The bowel preparation used was                            SUPREP via split dose instruction. Scope In: 10:04:13 AM Scope Out: 10:18:21 AM Scope Withdrawal Time: 0 hours 9 minutes 58 seconds  Total Procedure Duration: 0 hours 14 minutes 8 seconds  Findings:                 A 2 mm polyp was found in the ascending colon. The                            polyp was removed with a cold snare. Resection and                            retrieval were complete.  Multiple diverticula were found in the left colon                            and right colon.                           Internal hemorrhoids were found during                            retroflexion. The hemorrhoids were moderate.                           The exam was otherwise without abnormality on                            direct and retroflexion views. Complications:            No immediate complications. Estimated blood loss:                            None. Estimated Blood Loss:     Estimated blood loss: none. Impression:               - One 2 mm polyp in the ascending colon, removed                            with a cold snare. Resected and retrieved.                           - Diverticulosis in the left colon and in the right                            colon.                           - Internal hemorrhoids.                            - The examination was otherwise normal on direct                            and retroflexion views. Recommendation:           - Repeat colonoscopy in 5 years for surveillance                            (family history).                           - Patient has a contact number available for                            emergencies. The signs and symptoms of potential                            delayed complications were discussed with the  patient. Return to normal activities tomorrow.                            Written discharge instructions were provided to the                            patient.                           - Resume previous diet.                           - Continue present medications.                           - Await pathology results. Docia Chuck. Henrene Pastor, MD 05/12/2020 10:22:43 AM This report has been signed electronically.

## 2020-05-14 ENCOUNTER — Telehealth: Payer: Self-pay | Admitting: *Deleted

## 2020-05-14 NOTE — Telephone Encounter (Signed)
  Follow up Call-  Call back number 05/12/2020  Post procedure Call Back phone  # [phone] cell Brian Hunt - wife  Permission to leave phone message Yes  Some recent data might be hidden     Patient questions:  Do you have a fever, pain , or abdominal swelling? No. Pain Score  0 *  Have you tolerated food without any problems? Yes.    Have you been able to return to your normal activities? Yes.    Do you have any questions about your discharge instructions: Diet   No. Medications  No. Follow up visit  No.  Do you have questions or concerns about your Care? No.  Actions: * If pain score is 4 or above: No action needed, pain <4.  1. Have you developed a fever since your procedure? no  2.   Have you had an respiratory symptoms (SOB or cough) since your procedure? no  3.   Have you tested positive for COVID 19 since your procedure no  4.   Have you had any family members/close contacts diagnosed with the COVID 19 since your procedure?  no   If yes to any of these questions please route to Joylene John, RN and Joella Prince, RN

## 2020-05-19 ENCOUNTER — Encounter: Payer: Self-pay | Admitting: Internal Medicine

## 2020-05-24 ENCOUNTER — Encounter: Payer: Self-pay | Admitting: Internal Medicine

## 2020-05-26 ENCOUNTER — Other Ambulatory Visit: Payer: Self-pay | Admitting: Internal Medicine

## 2020-05-26 DIAGNOSIS — I6523 Occlusion and stenosis of bilateral carotid arteries: Secondary | ICD-10-CM

## 2020-05-26 DIAGNOSIS — I10 Essential (primary) hypertension: Secondary | ICD-10-CM

## 2020-05-26 MED ORDER — TELMISARTAN 40 MG PO TABS
ORAL_TABLET | ORAL | 1 refills | Status: DC
Start: 2020-05-26 — End: 2020-11-19

## 2020-06-21 ENCOUNTER — Ambulatory Visit: Payer: Medicare PPO | Admitting: Internal Medicine

## 2020-06-22 ENCOUNTER — Other Ambulatory Visit: Payer: Self-pay

## 2020-06-22 ENCOUNTER — Encounter: Payer: Self-pay | Admitting: Internal Medicine

## 2020-06-22 ENCOUNTER — Ambulatory Visit (INDEPENDENT_AMBULATORY_CARE_PROVIDER_SITE_OTHER): Payer: Medicare PPO | Admitting: Internal Medicine

## 2020-06-22 VITALS — BP 142/82 | HR 61 | Temp 97.6°F | Resp 16 | Ht 67.0 in | Wt 164.0 lb

## 2020-06-22 DIAGNOSIS — E871 Hypo-osmolality and hyponatremia: Secondary | ICD-10-CM

## 2020-06-22 DIAGNOSIS — E785 Hyperlipidemia, unspecified: Secondary | ICD-10-CM

## 2020-06-22 DIAGNOSIS — E222 Syndrome of inappropriate secretion of antidiuretic hormone: Secondary | ICD-10-CM | POA: Diagnosis not present

## 2020-06-22 DIAGNOSIS — I10 Essential (primary) hypertension: Secondary | ICD-10-CM

## 2020-06-22 LAB — BASIC METABOLIC PANEL
BUN: 17 mg/dL (ref 6–23)
CO2: 27 mEq/L (ref 19–32)
Calcium: 9.1 mg/dL (ref 8.4–10.5)
Chloride: 97 mEq/L (ref 96–112)
Creatinine, Ser: 0.87 mg/dL (ref 0.40–1.50)
GFR: 85.02 mL/min (ref >60.00)
Glucose, Bld: 89 mg/dL (ref 70–99)
Potassium: 4.4 mEq/L (ref 3.5–5.1)
Sodium: 131 mEq/L — ABNORMAL LOW (ref 135–145)

## 2020-06-22 NOTE — Patient Instructions (Signed)

## 2020-06-22 NOTE — Progress Notes (Signed)
Subjective:  Patient ID: Brian Hunt, male    DOB: 08-31-1946  Age: 74 y.o. MRN: [id number]  CC: Hypertension  This visit occurred during the SARS-CoV-2 public health emergency.  Safety protocols were in place, including screening questions prior to the visit, additional usage of staff PPE, and extensive cleaning of exam room while observing appropriate contact time as indicated for disinfecting solutions.    HPI Brian Hunt presents for f/up - He stopped taking pravastatin 3 months ago because he was experiencing muscle cramps.  Since stopping pravastatin the muscle cramps have all resolved.  He tells me his blood pressure is well controlled and he denies dizziness, lightheadedness, chest pain, shortness of breath, palpitations, edema, or fatigue.  I had prescribed demeclocycline for SIADH but he says it caused nausea and constipation so he stopped taking it after about 2 weeks.  Outpatient Medications Prior to Visit  Medication Sig Dispense Refill  . amLODipine (NORVASC) 5 MG tablet TAKE 1 TABLET(5 MG) BY MOUTH DAILY 90 tablet 1  . aspirin EC 81 MG tablet Take 81 mg by mouth daily.    . Multiple Vitamins-Minerals (PRESERVISION AREDS 2 PO) Take 1 tablet by mouth daily.     . tadalafil (CIALIS) 5 MG tablet Take 1 tablet (5 mg total) by mouth daily as needed for erectile dysfunction. 30 tablet 11  . telmisartan (MICARDIS) 40 MG tablet TAKE 1 TABLET BY MOUTH EVERY DAY 90 tablet 1  . demeclocycline (DECLOMYCIN) 150 MG tablet Take 1 tablet (150 mg total) by mouth 2 (two) times daily. 180 tablet 0  . 0.9 %  sodium chloride infusion      No facility-administered medications prior to visit.    ROS Review of Systems  Constitutional: Negative for appetite change, diaphoresis and fatigue.  HENT: Negative.   Eyes: Negative for visual disturbance.  Respiratory: Negative for cough, chest tightness, shortness of breath and wheezing.   Cardiovascular: Negative for chest pain, palpitations and  leg swelling.  Gastrointestinal: Negative for abdominal pain, diarrhea, nausea and vomiting.  Endocrine: Negative.  Negative for polyuria.  Genitourinary: Negative for difficulty urinating and frequency.  Musculoskeletal: Negative for arthralgias and myalgias.  Skin: Negative for color change.  Neurological: Negative.  Negative for dizziness, weakness and light-headedness.  Hematological: Negative for adenopathy. Does not bruise/bleed easily.  Psychiatric/Behavioral: Negative.     Objective:  BP (!) 142/82   Pulse 61   Temp 97.6 F (36.4 C) (Oral)   Resp 16   Ht 5\' 7"  (1.702 m)   Wt 164 lb (74.4 kg)   SpO2 97%   BMI 25.69 kg/m   BP Readings from Last 3 Encounters:  06/22/20 (!) 142/82  05/12/20 (!) 143/68  02/19/20 140/76    Wt Readings from Last 3 Encounters:  06/22/20 164 lb (74.4 kg)  05/12/20 160 lb (72.6 kg)  04/14/20 160 lb (72.6 kg)    Physical Exam Vitals reviewed.  HENT:     Nose: Nose normal.     Mouth/Throat:     Mouth: Mucous membranes are moist.  Eyes:     General: No scleral icterus.    Conjunctiva/sclera: Conjunctivae normal.  Cardiovascular:     Rate and Rhythm: Normal rate and regular rhythm.     Heart sounds: No murmur heard.   Pulmonary:     Effort: Pulmonary effort is normal.     Breath sounds: No stridor. No wheezing, rhonchi or rales.  Abdominal:     General: Abdomen is flat. Bowel  sounds are normal. There is no distension.     Palpations: Abdomen is soft. There is no hepatomegaly, splenomegaly or mass.     Tenderness: There is no abdominal tenderness.  Musculoskeletal:        General: Normal range of motion.     Cervical back: Neck supple.     Right lower leg: No edema.     Left lower leg: No edema.  Lymphadenopathy:     Cervical: No cervical adenopathy.  Skin:    General: Skin is warm and dry.     Coloration: Skin is not pale.  Neurological:     General: No focal deficit present.     Mental Status: He is alert.  Psychiatric:         Mood and Affect: Mood normal.        Behavior: Behavior normal.     Lab Results  Component Value Date   WBC 6.5 02/19/2020   HGB 13.8 02/19/2020   HCT 40.6 02/19/2020   PLT 302.0 02/19/2020   GLUCOSE 89 06/22/2020   CHOL 170 06/23/2020   TRIG 39.0 06/23/2020   HDL 56.30 06/23/2020   LDLCALC 105 (H) 06/23/2020   ALT 15 08/21/2019   AST 17 08/21/2019   NA 131 (L) 06/22/2020   K 4.4 06/22/2020   CL 97 06/22/2020   CREATININE 0.87 06/22/2020   BUN 17 06/22/2020   CO2 27 06/22/2020   TSH 2.25 08/21/2019   PSA 3.19 08/11/2019   INR 1.10 09/24/2017   HGBA1C 5.9 05/11/2015    CT Chest Wo Contrast  Result Date: 09/03/2019 CLINICAL DATA:  Followup pulmonary nodules. EXAM: CT CHEST WITHOUT CONTRAST TECHNIQUE: Multidetector CT imaging of the chest was performed following the standard protocol without IV contrast. COMPARISON:  CT scan 04/07/2011 FINDINGS: Cardiovascular: The heart is normal in size. No pericardial effusion. Advanced atherosclerotic calcifications involving the thoracic aorta and branch vessels including extensive three-vessel coronary artery calcifications. There are also calcifications along the aortic valve and at the mitral valve annulus. Mediastinum/Nodes: No mediastinal or hilar mass or adenopathy. The esophagus is grossly normal. Lungs/Pleura: Advanced emphysematous changes and areas of pulmonary scarring. No acute overlying pulmonary process. Stable 4 mm right upper lobe pulmonary nodule on image 49/3. Stable nodules along both major fissures consistent with benign lymph nodes. Few tiny subpleural pulmonary nodules are noted elsewhere. No worrisome pulmonary lesions or new pulmonary nodules. No pleural effusion or pleural nodules. Upper Abdomen: No significant upper abdominal findings. Stable mild nodularity of both adrenal glands. Stable upper abdominal aortic calcifications. Musculoskeletal: No chest wall mass, supraclavicular or axillary adenopathy the. 18 mm  right thyroid nodule is unchanged since 2012 and considered benign. No significant bony findings. IMPRESSION: 1. Advanced emphysematous changes but no acute overlying pulmonary process. 2. Stable scattered subpleural and perifissural nodules consistent with benign lymph nodes. No new pulmonary nodules or worrisome pulmonary lesions. 3. Advanced atherosclerotic calcifications involving the thoracic aorta and branch vessels including significant three-vessel coronary artery calcifications. 4. 18 mm right thyroid lobe nodule, unchanged since 2012 and considered benign. No follow-up recommended unless clinically warranted (ref: J Am Coll Radiol. 2015 Feb;12(2): 143-50). Aortic Atherosclerosis (ICD10-I70.0) and Emphysema (ICD10-J43.9). Electronically Signed   By: Marijo Sanes M.D.   On: 09/03/2019 08:52    Assessment & Plan:   Shalev was seen today for hypertension.  Diagnoses and all orders for this visit:  Essential hypertension- His blood pressure is adequately well controlled. -     BASIC METABOLIC PANEL  WITH GFR; Future -     Basic Metabolic Panel (BMET); Future -     Basic Metabolic Panel (BMET)  Hyponatremia- His sodium level has improved slightly and he is asymptomatic. -     BASIC METABOLIC PANEL WITH GFR; Future -     Basic Metabolic Panel (BMET); Future -     Basic Metabolic Panel (BMET)  SIADH (syndrome of inappropriate ADH production) ([place])- See above.  Hyperlipidemia with target LDL less than 70- He has an elevated ASCVD risk score.  I have asked him to try a low-dose statin with a better side effect profile. -     Lipid panel; Future -     Pitavastatin Calcium (LIVALO) 1 MG TABS; Take 1 tablet (1 mg total) by mouth daily.   I have discontinued Azim Gillingham. Puder's demeclocycline. I am also having him start on Livalo. Additionally, I am having him maintain his Multiple Vitamins-Minerals (PRESERVISION AREDS 2 PO), aspirin EC, tadalafil, amLODipine, and telmisartan. We will stop  administering sodium chloride.  Meds ordered this encounter  Medications  . Pitavastatin Calcium (LIVALO) 1 MG TABS    Sig: Take 1 tablet (1 mg total) by mouth daily.    Dispense:  90 tablet    Refill:  1     Follow-up: Return in about 6 months (around 12/21/2020).  Scarlette Calico, MD

## 2020-06-23 ENCOUNTER — Other Ambulatory Visit: Payer: Medicare PPO

## 2020-06-23 DIAGNOSIS — E785 Hyperlipidemia, unspecified: Secondary | ICD-10-CM

## 2020-06-23 LAB — LIPID PANEL
Cholesterol: 170 mg/dL (ref 0–200)
HDL: 56.3 mg/dL (ref >39.00)
LDL Cholesterol: 105 mg/dL — ABNORMAL HIGH (ref 0–99)
NonHDL: 113.2
Total CHOL/HDL Ratio: 3
Triglycerides: 39 mg/dL (ref 0.0–149.0)
VLDL: 7.8 mg/dL (ref 0.0–40.0)

## 2020-06-23 MED ORDER — LIVALO 1 MG PO TABS: 1.0000 | ORAL_TABLET | Freq: Every day | ORAL | 1 refills | Status: DC

## 2020-06-27 ENCOUNTER — Encounter: Payer: Self-pay | Admitting: Internal Medicine

## 2020-06-28 ENCOUNTER — Telehealth: Payer: Self-pay | Admitting: Pharmacist

## 2020-06-28 NOTE — Progress Notes (Signed)
Per [facility] formulary Livalo 1 mg is a Tier 3 medication requiring step therapy (failure of other preferred medications). Per chart review patient was unable to tolerate pravastatin due to muscle cramps, which resolved upon discontinuation of the drug. Livalo has the lowest incidence of myalgias among statins, and patient may be able to tolerate this over other statins.  PA for Livalo 1 mg completed via covermymeds. Awaiting response from [facility].  Key: [id number]

## 2020-06-29 ENCOUNTER — Other Ambulatory Visit: Payer: Self-pay | Admitting: Internal Medicine

## 2020-06-29 DIAGNOSIS — I6523 Occlusion and stenosis of bilateral carotid arteries: Secondary | ICD-10-CM

## 2020-06-29 DIAGNOSIS — E785 Hyperlipidemia, unspecified: Secondary | ICD-10-CM

## 2020-06-29 MED ORDER — ROSUVASTATIN CALCIUM 5 MG PO TABS: 5.0000 mg | ORAL_TABLET | Freq: Every day | ORAL | 1 refills | Status: DC

## 2020-06-29 NOTE — Progress Notes (Addendum)
Livalo PA denied per [facility] - patient must try and fail 2 statins before it can be approved. Patient has failed pravastatin. He must try and fail one of the following first: atorvastatin, lovastatin, simvastatin, or rosuvastatin    Recommendation: -Pt can try Rosuvastatin 5 mg daily. If he does not tolerate this, [employer] will cover Livalo

## 2020-06-30 DIAGNOSIS — H6123 Impacted cerumen, bilateral: Secondary | ICD-10-CM | POA: Diagnosis not present

## 2020-07-13 DIAGNOSIS — L570 Actinic keratosis: Secondary | ICD-10-CM | POA: Diagnosis not present

## 2020-07-13 DIAGNOSIS — L218 Other seborrheic dermatitis: Secondary | ICD-10-CM | POA: Diagnosis not present

## 2020-07-13 DIAGNOSIS — D485 Neoplasm of uncertain behavior of skin: Secondary | ICD-10-CM | POA: Diagnosis not present

## 2020-07-13 DIAGNOSIS — D225 Melanocytic nevi of trunk: Secondary | ICD-10-CM | POA: Diagnosis not present

## 2020-07-13 DIAGNOSIS — L821 Other seborrheic keratosis: Secondary | ICD-10-CM | POA: Diagnosis not present

## 2020-07-13 DIAGNOSIS — Z85828 Personal history of other malignant neoplasm of skin: Secondary | ICD-10-CM | POA: Diagnosis not present

## 2020-07-21 DIAGNOSIS — N401 Enlarged prostate with lower urinary tract symptoms: Secondary | ICD-10-CM | POA: Diagnosis not present

## 2020-07-21 LAB — PSA: PSA: 3.69

## 2020-07-28 DIAGNOSIS — N5201 Erectile dysfunction due to arterial insufficiency: Secondary | ICD-10-CM | POA: Diagnosis not present

## 2020-07-28 DIAGNOSIS — N401 Enlarged prostate with lower urinary tract symptoms: Secondary | ICD-10-CM | POA: Diagnosis not present

## 2020-07-28 DIAGNOSIS — R351 Nocturia: Secondary | ICD-10-CM | POA: Diagnosis not present

## 2020-08-23 ENCOUNTER — Other Ambulatory Visit: Payer: Self-pay | Admitting: Internal Medicine

## 2020-08-23 DIAGNOSIS — I10 Essential (primary) hypertension: Secondary | ICD-10-CM

## 2020-09-30 DIAGNOSIS — H25013 Cortical age-related cataract, bilateral: Secondary | ICD-10-CM | POA: Diagnosis not present

## 2020-09-30 DIAGNOSIS — H2513 Age-related nuclear cataract, bilateral: Secondary | ICD-10-CM | POA: Diagnosis not present

## 2020-09-30 DIAGNOSIS — H353132 Nonexudative age-related macular degeneration, bilateral, intermediate dry stage: Secondary | ICD-10-CM | POA: Diagnosis not present

## 2020-09-30 DIAGNOSIS — H43393 Other vitreous opacities, bilateral: Secondary | ICD-10-CM | POA: Diagnosis not present

## 2020-10-07 ENCOUNTER — Ambulatory Visit: Payer: Medicare PPO

## 2020-10-15 ENCOUNTER — Ambulatory Visit (INDEPENDENT_AMBULATORY_CARE_PROVIDER_SITE_OTHER): Payer: Medicare PPO

## 2020-10-15 ENCOUNTER — Other Ambulatory Visit: Payer: Self-pay

## 2020-10-15 VITALS — BP 130/80 | HR 68 | Temp 98.2°F | Resp 16 | Ht 67.0 in | Wt 166.6 lb

## 2020-10-15 DIAGNOSIS — Z0000 Encounter for general adult medical examination without abnormal findings: Secondary | ICD-10-CM

## 2020-10-15 NOTE — Progress Notes (Signed)
Subjective:   Brian Hunt is a 75 y.o. male who presents for Medicare Annual/Subsequent preventive examination.  Review of Systems    No ROS. Medicare Wellness Visit. Additional risk factors are reflected in social history. Cardiac Risk Factors include: advanced age (>67men, >41 women);dyslipidemia;family history of premature cardiovascular disease;hypertension;male gender     Objective:    Today's Vitals   10/15/20 0939  BP: 130/80  Pulse: 68  Resp: 16  Temp: 98.2 F (36.8 C)  SpO2: 96%  Weight: 166 lb 9.6 oz (75.6 kg)  Height: 5\' 7"  (1.702 m)  PainSc: 0-No pain   Body mass index is 26.09 kg/m.  Advanced Directives 10/15/2020 06/20/2018 12/13/2017 10/09/2017 09/26/2017 09/24/2017 06/14/2017  Does Patient Have a Medical Advance Directive? Yes Yes Yes Yes Yes Yes Yes  Type of Paramedic of [place];Living will [place];Living will [place];Living will [place];Living will [place];Living will [place];Living will [place];Living will  Does patient want to make changes to medical advance directive? No - Patient declined - - - No - Patient declined - -  Copy of [place] in Chart? No - copy requested - - - No - copy requested No - copy requested No - copy requested  Would patient like information on creating a medical advance directive? - - - - - - -    Current Medications (verified) Outpatient Encounter Medications as of 10/15/2020  Medication Sig  . amLODipine (NORVASC) 5 MG tablet TAKE 1 TABLET(5 MG) BY MOUTH DAILY  . aspirin EC 81 MG tablet Take 81 mg by mouth daily.  . Multiple Vitamins-Minerals (PRESERVISION AREDS 2 PO) Take 1 tablet by mouth daily.   . rosuvastatin (CRESTOR) 5 MG tablet Take 1 tablet (5 mg total) by mouth daily.  . tadalafil (CIALIS) 5 MG tablet Take 1 tablet (5 mg total) by mouth daily as needed  for erectile dysfunction.  Marland Kitchen telmisartan (MICARDIS) 40 MG tablet TAKE 1 TABLET BY MOUTH EVERY DAY   No facility-administered encounter medications on file as of 10/15/2020.    Allergies (verified) Demeclocycline, Pravastatin, Lisinopril, and Oxycodone   History: Past Medical History:  Diagnosis Date  . Anemia   . Anxiety   . Arthritis   . Basal cell carcinoma    Basal Cell X2 , Dr Radford Pax  . BPH (benign prostatic hyperplasia)   . Bursitis of left hip    Dr. Ihor Gully  . Carotid artery occlusion   . Complication of anesthesia    pt. states he had vasal vagal reaction after last surgery,after he got to his room  . COPD (chronic obstructive pulmonary disease) ([place])   . Diverticulosis   . Dyspnea    with exertion  . Elevated PSA   . Heart murmur    Diagnosed 03/2018.  Marland Kitchen Hyperlipidemia   . Hypertension   . Internal hemorrhoids   . Prostatitis    Dr Terance Hart  . RBBB    Past Surgical History:  Procedure Laterality Date  . COLONOSCOPY  2010   negative; [facility]  . CYSTOSCOPY  10/2014   [facility]; neg  . dislocation of right hip  2019  . GREEN LIGHT LASER TURP (TRANSURETHRAL RESECTION OF PROSTATE  09/27/2012   Procedure: GREEN LIGHT LASER TURP (TRANSURETHRAL RESECTION OF PROSTATE;  Surgeon: Fredricka Bonine, MD;  Location: [facility];  Service: Urology;  Laterality: N/A;     .  JOINT REPLACEMENT Left Oct. 12, 2012   left total hip replacement by Dr. Mayer Camel  . PROSTATE BIOPSY  2007 , 2009   X2 , Dr Thomasene Mohair  & Dr Terance Hart  . PROSTATE SURGERY     Laser  . TOTAL HIP ARTHROPLASTY Right 09/26/2017  . TOTAL HIP ARTHROPLASTY Right 09/26/2017   Procedure: TOTAL HIP ARTHROPLASTY ANTERIOR APPROACH;  Surgeon: Frederik Pear, MD;  Location: [facility];  Service: Orthopedics;  Laterality: Right;   Family History  Problem Relation Age of Onset  . Arthritis Mother   . Vascular Disease Mother        Varicose Veins  . Hypertension Mother   . Hyperlipidemia Mother   . Transient ischemic  attack Mother   . Heart disease Mother        After age 57  . Stroke Mother   . Heart attack Mother   . COPD Father   . Bone cancer Maternal Grandmother   . Heart attack Paternal Grandmother 29  . Colon cancer Other   . Asthma Neg Hx   . Diabetes Neg Hx   . Pancreatic cancer Neg Hx   . Esophageal cancer Neg Hx   . Stomach cancer Neg Hx   . Liver disease Neg Hx   . Rectal cancer Neg Hx    Social History   Socioeconomic History  . Marital status: Married    Spouse name: Not on file  . Number of children: 2  . Years of education: Not on file  . Highest education level: Not on file  Occupational History  . Occupation: retired  Tobacco Use  . Smoking status: Former Smoker    Packs/day: 0.00    Years: 40.00    Pack years: 0.00    Types: Cigarettes    Quit date: 2012    Years since quitting: 10.0  . Smokeless tobacco: Never Used  Vaping Use  . Vaping Use: Never used  Substance and Sexual Activity  . Alcohol use: No    Comment: 2001- states no rehab  . Drug use: No  . Sexual activity: Not on file  Other Topics Concern  . Not on file  Social History Narrative  . Not on file   Social Determinants of Health   Financial Resource Strain: Low Risk   . Difficulty of Paying Living Expenses: Not hard at all  Food Insecurity: No Food Insecurity  . Worried About Charity fundraiser in the Last Year: Never true  . Ran Out of Food in the Last Year: Never true  Transportation Needs: No Transportation Needs  . Lack of Transportation (Medical): No  . Lack of Transportation (Non-Medical): No  Physical Activity: Sufficiently Active  . Days of Exercise per Week: 3 days  . Minutes of Exercise per Session: 60 min  Stress: No Stress Concern Present  . Feeling of Stress : Not at all  Social Connections: Socially Integrated  . Frequency of Communication with Friends and Family: More than three times a week  . Frequency of Social Gatherings with Friends and Family: Once a week  .  Attends Religious Services: More than 4 times per year  . Active Member of Clubs or Organizations: Yes  . Attends Archivist Meetings: More than 4 times per year  . Marital Status: Married    Tobacco Counseling Counseling given: Not Answered   Clinical Intake:     Pain Score: 0-No pain     BMI - recorded: 26.09 Nutritional Status: BMI 25 -  29 Overweight Nutritional Risks: None Diabetes: No  How often do you need to have someone help you when you read instructions, pamphlets, or other written materials from your doctor or pharmacy?: 1 - Never What is the last grade level you completed in school?: Master's Degree  Diabetic? no  Interpreter Needed?: No  Information entered by :: Lisette Abu, LPN   Activities of Daily Living In your present state of health, do you have any difficulty performing the following activities: 10/15/2020  Hearing? N  Vision? N  Difficulty concentrating or making decisions? N  Walking or climbing stairs? N  Dressing or bathing? N  Doing errands, shopping? N  Preparing Food and eating ? N  Using the Toilet? N  In the past six months, have you accidently leaked urine? N  Do you have problems with loss of bowel control? N  Managing your Medications? N  Managing your Finances? N  Housekeeping or managing your Housekeeping? N  Some recent data might be hidden    Patient Care Team: Janith Lima, MD as PCP - General (Internal Medicine) Frederik Pear, MD (Orthopedic Surgery) Festus Aloe, MD as Consulting Physician (Urology) Dermatology, [facility] as Consulting Physician  Indicate any recent Medical Services you may have received from other than [facility] providers in the past year (date may be approximate).     Assessment:   This is a routine wellness examination for [place].  Hearing/Vision screen No exam data present  Dietary issues and exercise activities discussed: Current Exercise Habits: Home exercise routine, Time  (Minutes): 60, Frequency (Times/Week): 3, Weekly Exercise (Minutes/Week): 180, Intensity: Moderate, Exercise limited by: cardiac condition(s);respiratory conditions(s)  Goals    . Patient Stated     To maintain my current health status by continuing to eat healthy, stay physically active and socially active.      Depression Screen PHQ 2/9 Scores 10/15/2020 08/21/2019 08/20/2018 08/19/2018 07/17/2017 07/16/2017 06/17/2016  PHQ - 2 Score 0 0 0 1 0 0 0    Fall Risk Fall Risk  10/15/2020 08/21/2019 08/20/2018 08/19/2018 07/17/2017  Falls in the past year? 0 0 0 0 No  Number falls in past yr: 0 0 - 0 -  Injury with Fall? 0 0 - 0 -  Risk for fall due to : No Fall Risks - - - -  Follow up Falls evaluation completed Falls evaluation completed - Falls evaluation completed -    FALL RISK PREVENTION PERTAINING TO THE HOME:  Any stairs in or around the home? Yes  If so, are there any without handrails? No  Home free of loose throw rugs in walkways, pet beds, electrical cords, etc? Yes  Adequate lighting in your home to reduce risk of falls? Yes   ASSISTIVE DEVICES UTILIZED TO PREVENT FALLS:  Life alert? No  Use of a cane, walker or w/c? No  Grab bars in the bathroom? No  Shower chair or bench in shower? No  Elevated toilet seat or a handicapped toilet? Yes   TIMED UP AND GO:  Was the test performed? No .  Length of time to ambulate 10 feet: 0 sec.   Gait steady and fast without use of assistive device  Cognitive Function: Normal cognitive status assessed by direct observation by this Nurse Health Advisor. No abnormalities found.         Immunizations Immunization History  Administered Date(s) Administered  . Influenza Whole 09/19/1999, 07/24/2013  . Influenza, High Dose Seasonal PF 06/14/2016, 07/16/2017, 08/19/2018  . Influenza-Unspecified 09/02/2015, 06/13/2019,  06/21/2020  . PFIZER(Purple Top)SARS-COV-2 Vaccination 10/08/2019, 10/29/2019, 06/21/2020  . Pneumococcal Conjugate-13  02/26/2014  . Pneumococcal Polysaccharide-23 06/19/2011, 09/18/2014  . Tdap 05/11/2015    TDAP status: Up to date  Flu Vaccine status: Up to date  Pneumococcal vaccine status: Up to date  Covid-19 vaccine status: Completed vaccines  Qualifies for Shingles Vaccine? Yes   Zostavax completed No   Shingrix Completed?: No.    Education has been provided regarding the importance of this vaccine. Patient has been advised to call insurance company to determine out of pocket expense if they have not yet received this vaccine. Advised may also receive vaccine at local pharmacy or Health Dept. Verbalized acceptance and understanding.  Screening Tests Health Maintenance  Topic Date Due  . COVID-19 Vaccine (4 - Booster for Pfizer series) 12/20/2020  . TETANUS/TDAP  05/10/2025  . COLONOSCOPY (Pts 45-12yrs Insurance coverage will need to be confirmed)  05/12/2025  . INFLUENZA VACCINE  Completed  . Hepatitis C Screening  Completed  . PNA vac Low Risk Adult  Completed    Health Maintenance  There are no preventive care reminders to display for this patient.  Colorectal cancer screening: Type of screening: Colonoscopy. Completed 05/12/2020. Repeat every 5 years  Lung Cancer Screening: (Low Dose CT Chest recommended if Age 64-80 years, 30 pack-year currently smoking OR have quit w/in 15years.) does not qualify.   Lung Cancer Screening Referral: no  Additional Screening:  Hepatitis C Screening: does qualify; Completed yes  Vision Screening: Recommended annual ophthalmology exams for early detection of glaucoma and other disorders of the eye. Is the patient up to date with their annual eye exam?  Yes  Who is the provider or what is the name of the office in which the patient attends annual eye exams? Marshall Cork, MD. If pt is not established with a provider, would they like to be referred to a provider to establish care? No .   Dental Screening: Recommended annual dental exams for  proper oral hygiene  Community Resource Referral / Chronic Care Management: CRR required this visit?  No   CCM required this visit?  No      Plan:     I have personally reviewed and noted the following in the patient's chart:   . Medical and social history . Use of alcohol, tobacco or illicit drugs  . Current medications and supplements . Functional ability and status . Nutritional status . Physical activity . Advanced directives . List of other physicians . Hospitalizations, surgeries, and ER visits in previous 12 months . Vitals . Screenings to include cognitive, depression, and falls . Referrals and appointments  In addition, I have reviewed and discussed with patient certain preventive protocols, quality metrics, and best practice recommendations. A written personalized care plan for preventive services as well as general preventive health recommendations were provided to patient.     Sheral Flow, LPN   [id number]   Nurse Notes: n/a

## 2020-10-15 NOTE — Patient Instructions (Signed)
Brian Hunt , Thank you for taking time to come for your Medicare Wellness Visit. I appreciate your ongoing commitment to your health goals. Please review the following plan we discussed and let me know if I can assist you in the future.   Screening recommendations/referrals: Colonoscopy: last done on 05/12/2020; repeat every 5 years due to family history Recommended yearly ophthalmology/optometry visit for glaucoma screening and checkup Recommended yearly dental visit for hygiene and checkup  Vaccinations: Influenza vaccine: 06/21/2020 Pneumococcal vaccine: up to date Tdap vaccine: 05/11/2015; due every 10 years; due 2026 Shingles vaccine: never done   Covid-19: up to date  Advanced directives: Please bring a copy of your health care power of attorney and living will to the office at your convenience.  Conditions/risks identified: Yes; Reviewed health maintenance screenings with patient today and relevant education, vaccines, and/or referrals were provided. Please continue to do your personal lifestyle choices by: daily care of teeth and gums, regular physical activity (goal should be 5 days a week for 30 minutes), eat a healthy diet, avoid tobacco and drug use, limiting any alcohol intake, taking a low-dose aspirin (if not allergic or have been advised by your provider otherwise) and taking vitamins and minerals as recommended by your provider. Continue doing brain stimulating activities (puzzles, reading, adult coloring books, staying active) to keep memory sharp. Continue to eat heart healthy diet (full of fruits, vegetables, whole grains, lean protein, water--limit salt, fat, and sugar intake) and increase physical activity as tolerated.  Next appointment: Please schedule your next Medicare Wellness Visit with your Nurse Health Advisor in 1 year by calling [phone]. Preventive Care 75 Years and Older, Male Preventive care refers to lifestyle choices and visits with your health care  provider that can promote health and wellness. What does preventive care include?  A yearly physical exam. This is also called an annual well check.  Dental exams once or twice a year.  Routine eye exams. Ask your health care provider how often you should have your eyes checked.  Personal lifestyle choices, including:  Daily care of your teeth and gums.  Regular physical activity.  Eating a healthy diet.  Avoiding tobacco and drug use.  Limiting alcohol use.  Practicing safe sex.  Taking low doses of aspirin every day.  Taking vitamin and mineral supplements as recommended by your health care provider. What happens during an annual well check? The services and screenings done by your health care provider during your annual well check will depend on your age, overall health, lifestyle risk factors, and family history of disease. Counseling  Your health care provider may ask you questions about your:  Alcohol use.  Tobacco use.  Drug use.  Emotional well-being.  Home and relationship well-being.  Sexual activity.  Eating habits.  History of falls.  Memory and ability to understand (cognition).  Work and work Statistician. Screening  You may have the following tests or measurements:  Height, weight, and BMI.  Blood pressure.  Lipid and cholesterol levels. These may be checked every 5 years, or more frequently if you are over 75 years old.  Skin check.  Lung cancer screening. You may have this screening every year starting at age 75 if you have a 30-pack-year history of smoking and currently smoke or have quit within the past 15 years.  Fecal occult blood test (FOBT) of the stool. You may have this test every year starting at age 75.  Flexible sigmoidoscopy or colonoscopy. You may have a sigmoidoscopy every  5 years or a colonoscopy every 10 years starting at age 75.  Prostate cancer screening. Recommendations will vary depending on your family history and  other risks.  Hepatitis C blood test.  Hepatitis B blood test.  Sexually transmitted disease (STD) testing.  Diabetes screening. This is done by checking your blood sugar (glucose) after you have not eaten for a while (fasting). You may have this done every 1-3 years.  Abdominal aortic aneurysm (AAA) screening. You may need this if you are a current or former smoker.  Osteoporosis. You may be screened starting at age 75 if you are at high risk. Talk with your health care provider about your test results, treatment options, and if necessary, the need for more tests. Vaccines  Your health care provider may recommend certain vaccines, such as:  Influenza vaccine. This is recommended every year.  Tetanus, diphtheria, and acellular pertussis (Tdap, Td) vaccine. You may need a Td booster every 10 years.  Zoster vaccine. You may need this after age 75.  Pneumococcal 13-valent conjugate (PCV13) vaccine. One dose is recommended after age 75.  Pneumococcal polysaccharide (PPSV23) vaccine. One dose is recommended after age 75. Talk to your health care provider about which screenings and vaccines you need and how often you need them. This information is not intended to replace advice given to you by your health care provider. Make sure you discuss any questions you have with your health care provider. Document Released: 10/01/2015 Document Revised: 05/24/2016 Document Reviewed: 07/06/2015 Elsevier Interactive Patient Education  2017 [place] Prevention in the Home Falls can cause injuries. They can happen to people of all ages. There are many things you can do to make your home safe and to help prevent falls. What can I do on the outside of my home?  Regularly fix the edges of walkways and driveways and fix any cracks.  Remove anything that might make you trip as you walk through a door, such as a raised step or threshold.  Trim any bushes or trees on the path to your  home.  Use bright outdoor lighting.  Clear any walking paths of anything that might make someone trip, such as rocks or tools.  Regularly check to see if handrails are loose or broken. Make sure that both sides of any steps have handrails.  Any raised decks and porches should have guardrails on the edges.  Have any leaves, snow, or ice cleared regularly.  Use sand or salt on walking paths during winter.  Clean up any spills in your garage right away. This includes oil or grease spills. What can I do in the bathroom?  Use night lights.  Install grab bars by the toilet and in the tub and shower. Do not use towel bars as grab bars.  Use non-skid mats or decals in the tub or shower.  If you need to sit down in the shower, use a plastic, non-slip stool.  Keep the floor dry. Clean up any water that spills on the floor as soon as it happens.  Remove soap buildup in the tub or shower regularly.  Attach bath mats securely with double-sided non-slip rug tape.  Do not have throw rugs and other things on the floor that can make you trip. What can I do in the bedroom?  Use night lights.  Make sure that you have a light by your bed that is easy to reach.  Do not use any sheets or blankets that are too big  for your bed. They should not hang down onto the floor.  Have a firm chair that has side arms. You can use this for support while you get dressed.  Do not have throw rugs and other things on the floor that can make you trip. What can I do in the kitchen?  Clean up any spills right away.  Avoid walking on wet floors.  Keep items that you use a lot in easy-to-reach places.  If you need to reach something above you, use a strong step stool that has a grab bar.  Keep electrical cords out of the way.  Do not use floor polish or wax that makes floors slippery. If you must use wax, use non-skid floor wax.  Do not have throw rugs and other things on the floor that can make you  trip. What can I do with my stairs?  Do not leave any items on the stairs.  Make sure that there are handrails on both sides of the stairs and use them. Fix handrails that are broken or loose. Make sure that handrails are as long as the stairways.  Check any carpeting to make sure that it is firmly attached to the stairs. Fix any carpet that is loose or worn.  Avoid having throw rugs at the top or bottom of the stairs. If you do have throw rugs, attach them to the floor with carpet tape.  Make sure that you have a light switch at the top of the stairs and the bottom of the stairs. If you do not have them, ask someone to add them for you. What else can I do to help prevent falls?  Wear shoes that:  Do not have high heels.  Have rubber bottoms.  Are comfortable and fit you well.  Are closed at the toe. Do not wear sandals.  If you use a stepladder:  Make sure that it is fully opened. Do not climb a closed stepladder.  Make sure that both sides of the stepladder are locked into place.  Ask someone to hold it for you, if possible.  Clearly mark and make sure that you can see:  Any grab bars or handrails.  First and last steps.  Where the edge of each step is.  Use tools that help you move around (mobility aids) if they are needed. These include:  Canes.  Walkers.  Scooters.  Crutches.  Turn on the lights when you go into a dark area. Replace any light bulbs as soon as they burn out.  Set up your furniture so you have a clear path. Avoid moving your furniture around.  If any of your floors are uneven, fix them.  If there are any pets around you, be aware of where they are.  Review your medicines with your doctor. Some medicines can make you feel dizzy. This can increase your chance of falling. Ask your doctor what other things that you can do to help prevent falls. This information is not intended to replace advice given to you by your health care provider. Make  sure you discuss any questions you have with your health care provider. Document Released: 07/01/2009 Document Revised: 02/10/2016 Document Reviewed: 10/09/2014 Elsevier Interactive Patient Education  2017 [employer].

## 2020-11-19 ENCOUNTER — Other Ambulatory Visit: Payer: Self-pay | Admitting: Internal Medicine

## 2020-11-19 DIAGNOSIS — I10 Essential (primary) hypertension: Secondary | ICD-10-CM

## 2020-11-19 DIAGNOSIS — I6523 Occlusion and stenosis of bilateral carotid arteries: Secondary | ICD-10-CM

## 2020-11-23 ENCOUNTER — Other Ambulatory Visit: Payer: Self-pay | Admitting: Internal Medicine

## 2020-11-23 DIAGNOSIS — I6523 Occlusion and stenosis of bilateral carotid arteries: Secondary | ICD-10-CM

## 2020-11-23 DIAGNOSIS — E785 Hyperlipidemia, unspecified: Secondary | ICD-10-CM

## 2020-11-23 MED ORDER — ROSUVASTATIN CALCIUM 5 MG PO TABS: 5.0000 mg | ORAL_TABLET | Freq: Every day | ORAL | 1 refills | Status: DC

## 2020-11-24 ENCOUNTER — Other Ambulatory Visit: Payer: Self-pay | Admitting: Internal Medicine

## 2020-11-24 DIAGNOSIS — E785 Hyperlipidemia, unspecified: Secondary | ICD-10-CM

## 2020-11-24 DIAGNOSIS — I6523 Occlusion and stenosis of bilateral carotid arteries: Secondary | ICD-10-CM

## 2021-01-06 DIAGNOSIS — H6123 Impacted cerumen, bilateral: Secondary | ICD-10-CM | POA: Diagnosis not present

## 2021-02-15 ENCOUNTER — Telehealth: Payer: Self-pay | Admitting: Internal Medicine

## 2021-02-15 DIAGNOSIS — I10 Essential (primary) hypertension: Secondary | ICD-10-CM

## 2021-02-15 DIAGNOSIS — I6523 Occlusion and stenosis of bilateral carotid arteries: Secondary | ICD-10-CM

## 2021-02-15 NOTE — Telephone Encounter (Signed)
1.Medication Requested: amLODipine (NORVASC) 5 MG tablet  telmisartan (MICARDIS) 40 MG tablet    2. Pharmacy (Name, [facility]): [facility], [place] [address] AT [place]  3. On Med List: yes   4. Last Visit with PCP: 06-22-20  5. Next visit date with PCP: 03-09-21    Patient said that he is going out of town and is requesting a refill for the above medications. Please advise    Agent: Please be advised that RX refills may take up to 3 business days. We ask that you follow-up with your pharmacy.

## 2021-02-15 NOTE — Telephone Encounter (Signed)
PCP sent Rx's earlier today

## 2021-03-09 ENCOUNTER — Other Ambulatory Visit: Payer: Self-pay

## 2021-03-09 ENCOUNTER — Ambulatory Visit: Payer: Medicare PPO | Admitting: Internal Medicine

## 2021-03-09 ENCOUNTER — Encounter: Payer: Self-pay | Admitting: Internal Medicine

## 2021-03-09 VITALS — BP 136/78 | HR 60 | Temp 98.2°F | Resp 16 | Ht 67.0 in | Wt 164.0 lb

## 2021-03-09 DIAGNOSIS — I10 Essential (primary) hypertension: Secondary | ICD-10-CM | POA: Diagnosis not present

## 2021-03-09 DIAGNOSIS — E785 Hyperlipidemia, unspecified: Secondary | ICD-10-CM

## 2021-03-09 DIAGNOSIS — I350 Nonrheumatic aortic (valve) stenosis: Secondary | ICD-10-CM

## 2021-03-09 DIAGNOSIS — E222 Syndrome of inappropriate secretion of antidiuretic hormone: Secondary | ICD-10-CM | POA: Diagnosis not present

## 2021-03-09 DIAGNOSIS — N400 Enlarged prostate without lower urinary tract symptoms: Secondary | ICD-10-CM

## 2021-03-09 DIAGNOSIS — R918 Other nonspecific abnormal finding of lung field: Secondary | ICD-10-CM

## 2021-03-09 DIAGNOSIS — E538 Deficiency of other specified B group vitamins: Secondary | ICD-10-CM

## 2021-03-09 DIAGNOSIS — Z0000 Encounter for general adult medical examination without abnormal findings: Secondary | ICD-10-CM

## 2021-03-09 LAB — BASIC METABOLIC PANEL
BUN: 20 mg/dL (ref 6–23)
CO2: 26 mEq/L (ref 19–32)
Calcium: 9.1 mg/dL (ref 8.4–10.5)
Chloride: 96 mEq/L (ref 96–112)
Creatinine, Ser: 0.83 mg/dL (ref 0.40–1.50)
GFR: 86.1 mL/min (ref >60.00)
Glucose, Bld: 86 mg/dL (ref 70–99)
Potassium: 4.2 mEq/L (ref 3.5–5.1)
Sodium: 129 mEq/L — ABNORMAL LOW (ref 135–145)

## 2021-03-09 LAB — CBC WITH DIFFERENTIAL/PLATELET
Basophils Absolute: 0.1 10*3/uL (ref 0.0–0.1)
Basophils Relative: 0.8 % (ref 0.0–3.0)
Eosinophils Absolute: 0.5 10*3/uL (ref 0.0–0.7)
Eosinophils Relative: 7.2 % — ABNORMAL HIGH (ref 0.0–5.0)
HCT: 38.4 % — ABNORMAL LOW (ref 39.0–52.0)
Hemoglobin: 13.2 g/dL (ref 13.0–17.0)
Lymphocytes Relative: 23.1 % (ref 12.0–46.0)
Lymphs Abs: 1.7 10*3/uL (ref 0.7–4.0)
MCHC: 34.4 g/dL (ref 30.0–36.0)
MCV: 95.8 fl (ref 78.0–100.0)
Monocytes Absolute: 1 10*3/uL (ref 0.1–1.0)
Monocytes Relative: 13.4 % — ABNORMAL HIGH (ref 3.0–12.0)
Neutro Abs: 4.2 10*3/uL (ref 1.4–7.7)
Neutrophils Relative %: 55.5 % (ref 43.0–77.0)
Platelets: 280 10*3/uL (ref 150.0–400.0)
RBC: 4.01 Mil/uL — ABNORMAL LOW (ref 4.22–5.81)
RDW: 13.8 % (ref 11.5–15.5)
WBC: 7.5 10*3/uL (ref 4.0–10.5)

## 2021-03-09 LAB — TSH: TSH: 2.57 u[IU]/mL (ref 0.35–4.50)

## 2021-03-09 LAB — VITAMIN B12: Vitamin B-12: 532 pg/mL (ref 211–911)

## 2021-03-09 LAB — FOLATE: Folate: 23.3 ng/mL (ref >5.9)

## 2021-03-09 NOTE — Patient Instructions (Signed)

## 2021-03-09 NOTE — Progress Notes (Signed)
Subjective:  Patient ID: Brian Hunt, male    DOB: 18-Jul-1946  Age: 75 y.o. MRN: [id number]  CC: Hypertension and Hyperlipidemia  This visit occurred during the SARS-CoV-2 public health emergency.  Safety protocols were in place, including screening questions prior to the visit, additional usage of staff PPE, and extensive cleaning of exam room while observing appropriate contact time as indicated for disinfecting solutions.    HPI PRYOR GUETTLER presents for f/up -   He tells me his blood pressure has been well controlled.  He is active and denies any recent episodes of dizziness, lightheadedness, chest pain, shortness of breath, diaphoresis, or palpitations.  He would like to see Dr. Skeet Latch of cardiology.  Outpatient Medications Prior to Visit  Medication Sig Dispense Refill   amLODipine (NORVASC) 5 MG tablet TAKE 1 TABLET(5 MG) BY MOUTH DAILY 90 tablet 1   aspirin EC 81 MG tablet Take 81 mg by mouth daily.     Multiple Vitamins-Minerals (PRESERVISION AREDS 2 PO) Take 1 tablet by mouth daily.      rosuvastatin (CRESTOR) 5 MG tablet TAKE 1 TABLET EVERY DAY 90 tablet 1   tadalafil (CIALIS) 5 MG tablet Take 1 tablet (5 mg total) by mouth daily as needed for erectile dysfunction. 30 tablet 11   telmisartan (MICARDIS) 40 MG tablet TAKE 1 TABLET BY MOUTH EVERY DAY 90 tablet 0   No facility-administered medications prior to visit.    ROS Review of Systems  Constitutional:  Negative for diaphoresis and fatigue.  HENT: Negative.    Eyes: Negative.   Respiratory:  Negative for cough, chest tightness, shortness of breath and wheezing.   Cardiovascular:  Negative for chest pain, palpitations and leg swelling.  Gastrointestinal:  Negative for abdominal pain, constipation, diarrhea, nausea and vomiting.  Endocrine: Negative.  Negative for polyuria.  Genitourinary: Negative.  Negative for difficulty urinating and dysuria.  Musculoskeletal:  Negative for arthralgias and myalgias.   Skin: Negative.  Negative for color change and pallor.  Neurological:  Negative for dizziness, weakness and light-headedness.  Hematological:  Negative for adenopathy. Does not bruise/bleed easily.  Psychiatric/Behavioral: Negative.     Objective:  BP 136/78 (BP Location: Left Arm, Patient Position: Sitting, Cuff Size: Large)   Pulse 60   Temp 98.2 F (36.8 C) (Oral)   Resp 16   Ht 5\' 7"  (1.702 m)   Wt 164 lb (74.4 kg)   SpO2 98%   BMI 25.69 kg/m   BP Readings from Last 3 Encounters:  03/09/21 136/78  10/15/20 130/80  06/22/20 (!) 142/82    Wt Readings from Last 3 Encounters:  03/09/21 164 lb (74.4 kg)  10/15/20 166 lb 9.6 oz (75.6 kg)  06/22/20 164 lb (74.4 kg)    Physical Exam Vitals reviewed.  HENT:     Nose: Nose normal.     Mouth/Throat:     Mouth: Mucous membranes are moist.  Eyes:     Conjunctiva/sclera: Conjunctivae normal.  Cardiovascular:     Rate and Rhythm: Normal rate and regular rhythm.     Heart sounds: Murmur heard.  Systolic murmur is present with a grade of 1/6.  No diastolic murmur is present.    No gallop.  Pulmonary:     Effort: Pulmonary effort is normal.     Breath sounds: No stridor. No wheezing, rhonchi or rales.  Abdominal:     General: Abdomen is flat.     Palpations: There is no mass.     Tenderness:  There is no abdominal tenderness. There is no guarding.  Musculoskeletal:        General: Normal range of motion.     Cervical back: Neck supple.     Right lower leg: No edema.     Left lower leg: No edema.  Lymphadenopathy:     Cervical: No cervical adenopathy.  Skin:    General: Skin is warm and dry.  Neurological:     General: No focal deficit present.     Mental Status: He is alert.  Psychiatric:        Mood and Affect: Mood normal.        Behavior: Behavior normal.    Lab Results  Component Value Date   WBC 7.5 03/09/2021   HGB 13.2 03/09/2021   HCT 38.4 (L) 03/09/2021   PLT 280.0 03/09/2021   GLUCOSE 86  03/09/2021   CHOL 170 06/23/2020   TRIG 39.0 06/23/2020   HDL 56.30 06/23/2020   LDLCALC 105 (H) 06/23/2020   ALT 15 08/21/2019   AST 17 08/21/2019   NA 129 (L) 03/09/2021   K 4.2 03/09/2021   CL 96 03/09/2021   CREATININE 0.83 03/09/2021   BUN 20 03/09/2021   CO2 26 03/09/2021   TSH 2.57 03/09/2021   PSA 3.69 07/21/2020   INR 1.10 09/24/2017   HGBA1C 5.9 05/11/2015    CT Chest Wo Contrast  Result Date: 09/03/2019 CLINICAL DATA:  Followup pulmonary nodules. EXAM: CT CHEST WITHOUT CONTRAST TECHNIQUE: Multidetector CT imaging of the chest was performed following the standard protocol without IV contrast. COMPARISON:  CT scan 04/07/2011 FINDINGS: Cardiovascular: The heart is normal in size. No pericardial effusion. Advanced atherosclerotic calcifications involving the thoracic aorta and branch vessels including extensive three-vessel coronary artery calcifications. There are also calcifications along the aortic valve and at the mitral valve annulus. Mediastinum/Nodes: No mediastinal or hilar mass or adenopathy. The esophagus is grossly normal. Lungs/Pleura: Advanced emphysematous changes and areas of pulmonary scarring. No acute overlying pulmonary process. Stable 4 mm right upper lobe pulmonary nodule on image 49/3. Stable nodules along both major fissures consistent with benign lymph nodes. Few tiny subpleural pulmonary nodules are noted elsewhere. No worrisome pulmonary lesions or new pulmonary nodules. No pleural effusion or pleural nodules. Upper Abdomen: No significant upper abdominal findings. Stable mild nodularity of both adrenal glands. Stable upper abdominal aortic calcifications. Musculoskeletal: No chest wall mass, supraclavicular or axillary adenopathy the. 18 mm right thyroid nodule is unchanged since 2012 and considered benign. No significant bony findings. IMPRESSION: 1. Advanced emphysematous changes but no acute overlying pulmonary process. 2. Stable scattered subpleural and  perifissural nodules consistent with benign lymph nodes. No new pulmonary nodules or worrisome pulmonary lesions. 3. Advanced atherosclerotic calcifications involving the thoracic aorta and branch vessels including significant three-vessel coronary artery calcifications. 4. 18 mm right thyroid lobe nodule, unchanged since 2012 and considered benign. No follow-up recommended unless clinically warranted (ref: J Am Coll Radiol. 2015 Feb;12(2): 143-50). Aortic Atherosclerosis (ICD10-I70.0) and Emphysema (ICD10-J43.9). Electronically Signed   By: Marijo Sanes M.D.   On: 09/03/2019 08:52    Assessment & Plan:   Taj was seen today for hypertension and hyperlipidemia.  Diagnoses and all orders for this visit:  Primary hypertension- His blood pressure is adequately well controlled. -     Basic metabolic panel; Future -     TSH; Future -     TSH -     Basic metabolic panel  SIADH (syndrome of inappropriate ADH production) ([place])-  His sodium level is stable and he is asymptomatic with this. -     Basic metabolic panel; Future -     Basic metabolic panel  Benign prostatic hyperplasia without lower urinary tract symptoms  B12 deficiency-He is mildly anemic but B12 and folate are normal. -     CBC with Differential/Platelet; Future -     Vitamin B12; Future -     Folate; Future -     Folate -     Vitamin B12 -     CBC with Differential/Platelet  Hyperlipidemia with target LDL less than 70  Multiple lung nodules on CT- I have recommended a f/up non-contrast CT scan.  Routine general medical examination at a health care facility  Aortic stenosis, mild -     Ambulatory referral to Cardiology  I am having [name] maintain his Multiple Vitamins-Minerals (PRESERVISION AREDS 2 PO), aspirin EC, tadalafil, rosuvastatin, amLODipine, and telmisartan.  No orders of the defined types were placed in this encounter.    Follow-up: No follow-ups on file.  Scarlette Calico, MD

## 2021-04-12 ENCOUNTER — Ambulatory Visit (INDEPENDENT_AMBULATORY_CARE_PROVIDER_SITE_OTHER)
Admission: RE | Admit: 2021-04-12 | Discharge: 2021-04-12 | Disposition: A | Discharge status: Home / Self Care | Payer: Medicare PPO | Source: Ambulatory Visit | Attending: Internal Medicine | Admitting: Internal Medicine

## 2021-04-12 ENCOUNTER — Other Ambulatory Visit: Payer: Self-pay

## 2021-04-12 DIAGNOSIS — R918 Other nonspecific abnormal finding of lung field: Secondary | ICD-10-CM

## 2021-04-12 DIAGNOSIS — J439 Emphysema, unspecified: Secondary | ICD-10-CM | POA: Diagnosis not present

## 2021-04-12 DIAGNOSIS — R911 Solitary pulmonary nodule: Secondary | ICD-10-CM | POA: Diagnosis not present

## 2021-04-12 DIAGNOSIS — I700 Atherosclerosis of aorta: Secondary | ICD-10-CM | POA: Diagnosis not present

## 2021-04-14 DIAGNOSIS — Z85828 Personal history of other malignant neoplasm of skin: Secondary | ICD-10-CM | POA: Diagnosis not present

## 2021-04-14 DIAGNOSIS — C44311 Basal cell carcinoma of skin of nose: Secondary | ICD-10-CM | POA: Diagnosis not present

## 2021-05-12 ENCOUNTER — Other Ambulatory Visit: Payer: Self-pay | Admitting: Internal Medicine

## 2021-05-12 DIAGNOSIS — I6523 Occlusion and stenosis of bilateral carotid arteries: Secondary | ICD-10-CM

## 2021-05-12 DIAGNOSIS — I10 Essential (primary) hypertension: Secondary | ICD-10-CM

## 2021-05-17 DIAGNOSIS — Z85828 Personal history of other malignant neoplasm of skin: Secondary | ICD-10-CM | POA: Diagnosis not present

## 2021-05-17 DIAGNOSIS — C44311 Basal cell carcinoma of skin of nose: Secondary | ICD-10-CM | POA: Diagnosis not present

## 2021-06-15 ENCOUNTER — Encounter (HOSPITAL_BASED_OUTPATIENT_CLINIC_OR_DEPARTMENT_OTHER): Payer: Self-pay | Admitting: Cardiovascular Disease

## 2021-06-15 ENCOUNTER — Other Ambulatory Visit: Payer: Self-pay

## 2021-06-15 ENCOUNTER — Ambulatory Visit (HOSPITAL_BASED_OUTPATIENT_CLINIC_OR_DEPARTMENT_OTHER): Payer: Medicare PPO | Admitting: Cardiovascular Disease

## 2021-06-15 VITALS — BP 154/78 | HR 60 | Ht 67.0 in | Wt 165.1 lb

## 2021-06-15 DIAGNOSIS — M79605 Pain in left leg: Secondary | ICD-10-CM

## 2021-06-15 DIAGNOSIS — E222 Syndrome of inappropriate secretion of antidiuretic hormone: Secondary | ICD-10-CM | POA: Diagnosis not present

## 2021-06-15 DIAGNOSIS — I350 Nonrheumatic aortic (valve) stenosis: Secondary | ICD-10-CM

## 2021-06-15 DIAGNOSIS — R0789 Other chest pain: Secondary | ICD-10-CM

## 2021-06-15 DIAGNOSIS — M79606 Pain in leg, unspecified: Secondary | ICD-10-CM | POA: Insufficient documentation

## 2021-06-15 DIAGNOSIS — M79604 Pain in right leg: Secondary | ICD-10-CM

## 2021-06-15 DIAGNOSIS — E785 Hyperlipidemia, unspecified: Secondary | ICD-10-CM | POA: Diagnosis not present

## 2021-06-15 DIAGNOSIS — I6529 Occlusion and stenosis of unspecified carotid artery: Secondary | ICD-10-CM

## 2021-06-15 DIAGNOSIS — I10 Essential (primary) hypertension: Secondary | ICD-10-CM | POA: Diagnosis not present

## 2021-06-15 DIAGNOSIS — I6523 Occlusion and stenosis of bilateral carotid arteries: Secondary | ICD-10-CM

## 2021-06-15 HISTORY — DX: Occlusion and stenosis of unspecified carotid artery: I65.29

## 2021-06-15 HISTORY — DX: Pain in leg, unspecified: M79.606

## 2021-06-15 NOTE — Assessment & Plan Note (Signed)
He previously followed with vein and vascular.  He stopped going during COVID-19.  We will repeat his carotid Dopplers and refer back to vascular.  Blood pressures are asymmetric.  This will also help assess for subclavian stenosis.

## 2021-06-15 NOTE — Patient Instructions (Addendum)
Medication Instructions:  Your physician recommends that you continue on your current medications as directed. Please refer to the Current Medication list given to you today.   *If you need a refill on your cardiac medications before your next appointment, please call your pharmacy*  Lab Work: NONE   Testing/Procedures: Your physician has requested that you have a carotid duplex. This test is an ultrasound of the carotid arteries in your neck. It looks at blood flow through these arteries that supply the brain with blood. Allow one hour for this exam. There are no restrictions or special instructions.  Your physician has requested that you have an ankle brachial index (ABI). During this test an ultrasound and blood pressure cuff are used to evaluate the arteries that supply the arms and legs with blood. Allow thirty minutes for this exam. There are no restrictions or special instructions.  Your physician has requested that you have a lower or upper extremity arterial duplex. This test is an ultrasound of the arteries in the legs or arms. It looks at arterial blood flow in the legs and arms. Allow one hour for Lower and Upper Arterial scans. There are no restrictions or special instructions  Your physician has requested that you have an echocardiogram. Echocardiography is a painless test that uses sound waves to create images of your heart. It provides your doctor with information about the size and shape of your heart and how well your heart's chambers and valves are working. This procedure takes approximately one hour. There are no restrictions for this procedure.   Your physician has requested that you have en exercise stress myoview. For further information please visit [email or web address]. Please follow instruction sheet, as given. [facility] AT [address]   Follow-Up: At [facility], you and your health needs are our priority.  As part of our continuing mission to  provide you with exceptional heart care, we have created designated Provider Care Teams.  These Care Teams include your primary Cardiologist (physician) and Advanced Practice Providers (APPs -  Physician Assistants and Nurse Practitioners) who all work together to provide you with the care you need, when you need it.  We recommend signing up for the patient portal called "MyChart".  Sign up information is provided on this After Visit Summary.  MyChart is used to connect with patients for Virtual Visits (Telemedicine).  Patients are able to view lab/test results, encounter notes, upcoming appointments, etc.  Non-urgent messages can be sent to your provider as well.   To learn more about what you can do with MyChart, go to [email or web address].    Your next appointment:   4 month(s)  The format for your next appointment:   In Person  Provider:   Skeet Latch, MD  Your physician recommends that you schedule a follow-up appointment in: [address]  Other Instructions MONITOR AND LOG YOUR BLOOD PRESSURE TWICE A DAY  BRING READINGS AND MACHINE TO FOLLOW UP   HOLD ROSUVASTATIN FOR 2 WEEKS  IF SYMPTOMS RESOLVE CALL THE OFFICE IF NOT RESUME ROSUVASTATIN

## 2021-06-15 NOTE — Assessment & Plan Note (Signed)
Lipids were not well-controlled when checked last year.  He has struggled with leg pain.  He did not tolerate pravastatin in the past.  It is unclear whether this is due to myalgias, claudication, varicose veins, or hip pain.  He will try holding his statin for 2 weeks.  If his symptoms do not improve he will restart it.  Check fasting lipids and a CMP at follow-up.

## 2021-06-15 NOTE — Assessment & Plan Note (Addendum)
He has noted aching in his legs that has been ongoing for months.  He had worse symtpoms on pravastatin and has been on rosuvastatin for 6-8 months.  It is worse on the R.  Peripheral pulses are poor.  His symptoms do not sound like claudication but he does have vascular disease and multiple other territories.  We will check ABIs and Dopplers.

## 2021-06-15 NOTE — Assessment & Plan Note (Signed)
Aortic stenosis was mild when last checked in 2019.  It sounds as though it is moderate.  He is asymptomatic.  Repeat echocardiogram.

## 2021-06-15 NOTE — Assessment & Plan Note (Signed)
Blood pressure is elevated in the office today, but he reports that it is well have been controlled.  He is going to track his blood pressures and bring his machine to follow-up.  Continue amlodipine and telmisartan.

## 2021-06-15 NOTE — Progress Notes (Signed)
Cardiology Office Note:    Date:  06/15/2021   ID:  Brian Hunt, DOB [birth year], MRN [id number]  PCP:  Janith Lima, MD   [facility] Providers Cardiologist:  None     Referring MD: Janith Lima, MD   No chief complaint on file.   History of Present Illness:    Brian Hunt is a 75 y.o. male with a hx of anemia, anxiety, arthritis, basal cell carcinoma, BPH, carotid artery occlusion, complication of anesthesia (vasal vagal reaction), COPD, diverticulosis, DOE, mild aortic stenosis, hyperlipidemia, hypertension, and RBBB, here to establish care in the [facility]. He saw Dr. Ronnald Ramp 02/2021, and his blood pressure was 136/78. He reported that his blood pressure had been well controlled. Mild aortic stenosis was also noted and he was referred to cardiology. He previously had an echo 02/2018 with LVEF 69-62%, grade 1 diastolic dysfunction, and mild stenosis  with a mean gradient of 13 mm HG.  Today, he reports is feeling okay most of the time. At home he is seeing blood pressure readings of 120s/70s, and notes his blood pressure today is surprising to him. With deep inspiration he has been able to lower his blood pressure at times. Overall he is unsure of how much his symptoms are related to advanced age or underlying conditions. Generally, he has been feeling more lethargic and fatigued, and slightly depressed. His main complaint is bilateral LE pain/aches while walking. This seems to resolve if he walks long enough, but may limit walking in certain locations such as the beach. The pain is not constant, he had no pain while walking to this office from the parking lot. However, over a period of 2 weeks this would occur. The LE pain has become concerning within the past few months, and his pain is generally more severe in his right LE. Of note, two weeks after his prior right hip replacement, his hip was dislocated. With exertion such as lifting heavy objects, he may have  mild chest pain that he considers to be baseline and does not concern him. Climbing up stairs may cause shortness of breath. While lying down, he develops shortness of breath with a quick duration when he first gets into bed. At baseline, he wakes up around 5 AM regardless of his bedtime. Thus he may not feel well-rested if he stays up too late. Typically, he stays active by playing golf, or with manual labor in [place]. Regular formal exercise may consist of free weights, body weights, or resistance bands. He usually feels fine during his exercise or yard work, but is very fatigued afterwards. He states he needs to do more cardio exercises. He purposely tries to stay hydrated, and keeps water bottles with him. Every now and then he smokes (varied amount), but he is working on quitting. Previously while on pravastatin, he developed severe muscle cramps. Currently he takes rosuvastatin and denies any muscle cramps. Of note, he also reports being anemic and having a low sodium level. He denies any palpitations. No lightheadedness, headaches, syncope, or PND. Also has no lower extremity edema.  Past Medical History:  Diagnosis Date   Anemia    Anxiety    Arthritis    Basal cell carcinoma    Basal Cell X2 , Dr Radford Pax   BPH (benign prostatic hyperplasia)    Bursitis of left hip    Dr. Ihor Gully   Carotid artery occlusion    Carotid stenosis 06/15/2021  Complication of anesthesia    pt. states he had vasal vagal reaction after last surgery,after he got to his room   COPD (chronic obstructive pulmonary disease) ([place])    Diverticulosis    Dyspnea    with exertion   Elevated PSA    Heart murmur    Diagnosed 03/2018.   Hyperlipidemia    Hypertension    Internal hemorrhoids    Leg pain 06/15/2021   Prostatitis    Dr Terance Hart   RBBB     Past Surgical History:  Procedure Laterality Date   COLONOSCOPY  2010   negative; [facility]   CYSTOSCOPY  10/2014   [facility]; neg   dislocation of right hip  2019   GREEN LIGHT LASER TURP (TRANSURETHRAL RESECTION OF PROSTATE  09/27/2012   Procedure: GREEN LIGHT LASER TURP (TRANSURETHRAL RESECTION OF PROSTATE;  Surgeon: Fredricka Bonine, MD;  Location: [facility];  Service: Urology;  Laterality: N/A;      JOINT REPLACEMENT Left Oct. 12, 2012   left total hip replacement by Dr. Mayer Camel   PROSTATE BIOPSY  2007 , 2009   X2 , Dr Thomasene Mohair  & Dr Terance Hart   PROSTATE SURGERY     Laser   TOTAL HIP ARTHROPLASTY Right 09/26/2017   TOTAL HIP ARTHROPLASTY Right 09/26/2017   Procedure: TOTAL HIP ARTHROPLASTY ANTERIOR APPROACH;  Surgeon: Frederik Pear, MD;  Location: [facility];  Service: Orthopedics;  Laterality: Right;    Current Medications: Current Meds  Medication Sig   amLODipine (NORVASC) 5 MG tablet TAKE 1 TABLET(5 MG) BY MOUTH DAILY   aspirin EC 81 MG tablet Take 81 mg by mouth daily.   Multiple Vitamins-Minerals (PRESERVISION AREDS 2 PO) Take 1 tablet by mouth daily.    rosuvastatin (CRESTOR) 5 MG tablet TAKE 1 TABLET EVERY DAY   tadalafil (CIALIS) 5 MG tablet Take 1 tablet (5 mg total) by mouth daily as needed for erectile dysfunction.   telmisartan (MICARDIS) 40 MG tablet TAKE 1 TABLET BY MOUTH EVERY DAY     Allergies:   Demeclocycline, Pravastatin, Lisinopril, and Oxycodone   Social History   Socioeconomic History   Marital status: Married    Spouse name: Not on file   Number of children: 2   Years of education: Not on file   Highest education level: Not on file  Occupational History   Occupation: retired  Tobacco Use   Smoking status: Some Days    Packs/day: 0.00    Years: 40.00    Pack years: 0.00    Types: Cigarettes    Last attempt to quit: 2012    Years since quitting: 10.7   Smokeless tobacco: Never  Vaping Use   Vaping Use: Never used  Substance and Sexual Activity   Alcohol use: No    Comment: 2001- states no rehab   Drug use: No   Sexual activity: Not on file  Other Topics  Concern   Not on file  Social History Narrative   Not on file   Social Determinants of Health   Financial Resource Strain: Low Risk    Difficulty of Paying Living Expenses: Not hard at all  Food Insecurity: No Food Insecurity   Worried About Charity fundraiser in the Last Year: Never true   [place] in the Last Year: Never true  Transportation Needs: No Transportation Needs   Lack of Transportation (Medical): No   Lack of Transportation (Non-Medical): No  Physical Activity: Insufficiently Active  Days of Exercise per Week: 3 days   Minutes of Exercise per Session: 40 min  Stress: No Stress Concern Present   Feeling of Stress : Not at all  Social Connections: Socially Integrated   Frequency of Communication with Friends and Family: More than three times a week   Frequency of Social Gatherings with Friends and Family: Once a week   Attends Religious Services: More than 4 times per year   Active Member of [employer] or Organizations: Yes   Attends Music therapist: More than 4 times per year   Marital Status: Married     Family History: The patient's family history includes Arthritis in his mother; Bone cancer in his maternal grandmother; COPD in his father; Colon cancer (age of onset: 34) in his son; Heart attack (age of onset: 57) in his paternal grandmother; Heart disease in his mother; Hyperlipidemia in his mother; Hypertension in his mother; Stroke in his mother; Transient ischemic attack in his mother; Turner syndrome in his daughter; Vascular Disease in his mother. There is no history of Asthma, Diabetes, Pancreatic cancer, Esophageal cancer, Stomach cancer, Liver disease, or Rectal cancer.  ROS:   Please see the history of present illness.    (+) Bilateral LE pain with walking (R>L) (+) Shortness of breath (+) Orthopnea (+) Fatigue (+) Depression All other systems reviewed and are negative.  EKGs/Labs/Other Studies Reviewed:    The following studies  were reviewed today:  CT Chest 04/12/2021: COMPARISON:  09/02/2019   FINDINGS: Cardiovascular: Heart size is normal. Advanced coronary artery calcification. Aortic atherosclerotic calcification. No aneurysmal dilatation. No pericardial fluid.   Mediastinum/Nodes: No mediastinal mass or lymphadenopathy. Chronic thyroid nodule on the right, maximal dimension 19 mm, unchanged since 20/12 and considered benign.   Lungs/Pleura: Emphysema, upper lobe predominant. No evidence of infiltrate or collapse. No pleural effusion. 4 mm pulmonary nodule in the right upper lobe axial image 50 remains unchanged. Few tiny perifissural nodules and subpleural nodules are unchanged. No new or enlarging findings.   Upper Abdomen: Negative   Musculoskeletal: Ordinary thoracic degenerative change.   IMPRESSION: Stable examination. Background emphysema. Stable small pulmonary nodules that do not require further follow-up. See above for full discussion.   Aortic Atherosclerosis (ICD10-I70.0) and Emphysema (ICD10-J43.9). Coronary artery calcification present extensively.   19 mm right thyroid nodule, unchanged since 20/12 and considered benign. No follow-up recommended unless clinically warranted.    TTE 03/07/2018: - Left ventricle: The cavity size was normal. There was mild focal    basal hypertrophy of the septum. Systolic function was vigorous.    The estimated ejection fraction was in the range of 65% to 70%.    Wall motion was normal; there were no regional wall motion    abnormalities. Doppler parameters are consistent with abnormal    left ventricular relaxation (grade 1 diastolic dysfunction).  - Aortic valve: Valve mobility was restricted. There was mild    stenosis. Peak velocity (S): 259 cm/s. Mean gradient (S): 13 mm    Hg.  - Mitral valve: Calcified annulus. Mildly thickened leaflets .  - Left atrium: The atrium was mildly dilated. Volume/bsa, ES,    (1-plane Simpson&'s, A2C): 36.8  ml/m^2.  - Tricuspid valve: There was mild regurgitation.  - Pulmonary arteries: Systolic pressure was mildly increased. PA    peak pressure: 37 mm Hg (S).    EKG:    06/15/2021: Sinus rhythm. Rate 60 bpm. PVCs. RBBB.  Recent Labs: 03/09/2021: BUN 20; Creatinine, Ser 0.83; Hemoglobin 13.2;  Platelets 280.0; Potassium 4.2; Sodium 129; TSH 2.57   Recent Lipid Panel    Component Value Date/Time   CHOL 170 06/23/2020 1512   TRIG 39.0 06/23/2020 1512   HDL 56.30 06/23/2020 1512   CHOLHDL 3 06/23/2020 1512   VLDL 7.8 06/23/2020 1512   LDLCALC 105 (H) 06/23/2020 1512          Physical Exam:    Wt Readings from Last 3 Encounters:  06/15/21 165 lb 1.6 oz (74.9 kg)  03/09/21 164 lb (74.4 kg)  10/15/20 166 lb 9.6 oz (75.6 kg)     VS:  BP (!) 154/78 (BP Location: Left Arm, Patient Position: Sitting, Cuff Size: Normal)   Pulse 60   Ht 5\' 7"  (1.702 m)   Wt 165 lb 1.6 oz (74.9 kg)   BMI 25.86 kg/m  , BMI Body mass index is 25.86 kg/m. GENERAL:  Well appearing HEENT: Pupils equal round and reactive, fundi not visualized, oral mucosa unremarkable NECK:  No jugular venous distention, waveform within normal limits, carotid upstroke brisk and symmetric, +R carotid bruit, no thyromegaly LYMPHATICS:  No cervical adenopathy LUNGS:  Clear to auscultation bilaterally HEART:  RRR.  PMI not displaced or sustained,S1 and S2 within normal limits, no S3, no S4, no clicks, no rubs, 2/6 systolic murmur. ABD:  Flat, positive bowel sounds normal in frequency in pitch, no bruits, no rebound, no guarding, no midline pulsatile mass, no hepatomegaly, no splenomegaly EXT:  1+ DP/TP bilaterally, no edema, no cyanosis no clubbing SKIN:  Varicose veins in right LE. No rashes no nodules NEURO:  Cranial nerves II through XII grossly intact, motor grossly intact throughout PSYCH:  Cognitively intact, oriented to person place and time   ASSESSMENT:    1. Atypical chest pain   2. Pain in both lower  extremities   3. Hyperlipidemia with target LDL less than 70   4. Aortic stenosis, mild   5. Primary hypertension   6. Bilateral carotid artery stenosis    PLAN:    Leg pain He has noted aching in his legs that has been ongoing for months.  He had worse symtpoms on pravastatin and has been on rosuvastatin for 6-8 months.  It is worse on the R.  Peripheral pulses are poor.  His symptoms do not sound like claudication but he does have vascular disease and multiple other territories.  We will check ABIs and Dopplers.  Hyperlipidemia with target LDL less than 70 Lipids were not well-controlled when checked last year.  He has struggled with leg pain.  He did not tolerate pravastatin in the past.  It is unclear whether this is due to myalgias, claudication, varicose veins, or hip pain.  He will try holding his statin for 2 weeks.  If his symptoms do not improve he will restart it.  Check fasting lipids and a CMP at follow-up.  Aortic stenosis, mild Aortic stenosis was mild when last checked in 2019.  It sounds as though it is moderate.  He is asymptomatic.  Repeat echocardiogram.  HTN (hypertension) Blood pressure is elevated in the office today, but he reports that it is well have been controlled.  He is going to track his blood pressures and bring his machine to follow-up.  Continue amlodipine and telmisartan.  Carotid stenosis He previously followed with vein and vascular.  He stopped going during COVID-19.  We will repeat his carotid Dopplers and refer back to vascular.  Blood pressures are asymmetric.  This will also help assess  for subclavian stenosis.     Shared Decision Making/Informed Consent The risks [chest pain, shortness of breath, cardiac arrhythmias, dizziness, blood pressure fluctuations, myocardial infarction, stroke/transient ischemic attack, nausea, vomiting, allergic reaction, radiation exposure, metallic taste sensation and life-threatening complications (estimated to be 1  in 10,000)], benefits (risk stratification, diagnosing coronary artery disease, treatment guidance) and alternatives of a nuclear stress test were discussed in detail with Brian Hunt and he agrees to proceed.    Disposition: FU with Giovanna Kemmerer C. Oval Linsey, MD, [facility] in 4 months.  Medication Adjustments/Labs and Tests Ordered: Current medicines are reviewed at length with the patient today.  Concerns regarding medicines are outlined above.   Orders Placed This Encounter  Procedures   AMB Referral to [facility]   Cardiac Stress Test: Informed Consent Details: Physician/Practitioner Attestation; Transcribe to consent form and obtain patient signature   MYOCARDIAL PERFUSION IMAGING   EKG 12-Lead   ECHOCARDIOGRAM COMPLETE   VAS US CAROTID   VAS [place] LOWER EXTREMITY ARTERIAL DUPLEX   VAS [place] ABI WITH/WO TBI    No orders of the defined types were placed in this encounter.  Patient Instructions  Medication Instructions:  Your physician recommends that you continue on your current medications as directed. Please refer to the Current Medication list given to you today.   *If you need a refill on your cardiac medications before your next appointment, please call your pharmacy*  Lab Work: NONE   Testing/Procedures: Your physician has requested that you have a carotid duplex. This test is an ultrasound of the carotid arteries in your neck. It looks at blood flow through these arteries that supply the brain with blood. Allow one hour for this exam. There are no restrictions or special instructions.  Your physician has requested that you have an ankle brachial index (ABI). During this test an ultrasound and blood pressure cuff are used to evaluate the arteries that supply the arms and legs with blood. Allow thirty minutes for this exam. There are no restrictions or special instructions.  Your physician has requested that you have a lower or upper extremity arterial duplex. This test is an  ultrasound of the arteries in the legs or arms. It looks at arterial blood flow in the legs and arms. Allow one hour for Lower and Upper Arterial scans. There are no restrictions or special instructions  Your physician has requested that you have an echocardiogram. Echocardiography is a painless test that uses sound waves to create images of your heart. It provides your doctor with information about the size and shape of your heart and how well your heart's chambers and valves are working. This procedure takes approximately one hour. There are no restrictions for this procedure.   Your physician has requested that you have en exercise stress myoview. For further information please visit [email or web address]. Please follow instruction sheet, as given. [facility] [address]   Follow-Up: At [facility], you and your health needs are our priority.  As part of our continuing mission to provide you with exceptional heart care, we have created designated Provider Care Teams.  These Care Teams include your primary Cardiologist (physician) and Advanced Practice Providers (APPs -  Physician Assistants and Nurse Practitioners) who all work together to provide you with the care you need, when you need it.  We recommend signing up for the patient portal called "MyChart".  Sign up information is provided on this After Visit Summary.  MyChart is used to connect with patients  for Virtual Visits (Telemedicine).  Patients are able to view lab/test results, encounter notes, upcoming appointments, etc.  Non-urgent messages can be sent to your provider as well.   To learn more about what you can do with MyChart, go to [email or web address].    Your next appointment:   4 month(s)  The format for your next appointment:   In Person  Provider:   Skeet Latch, MD  Your physician recommends that you schedule a follow-up appointment in: [address]  Other Instructions MONITOR  AND LOG YOUR BLOOD PRESSURE TWICE A DAY  BRING READINGS AND MACHINE TO FOLLOW UP   HOLD ROSUVASTATIN FOR 2 WEEKS  IF SYMPTOMS RESOLVE CALL THE OFFICE IF NOT RESUME ROSUVASTATIN     I,Mathew Stumpf,acting as a scribe for Skeet Latch, MD.,have documented all relevant documentation on the behalf of Skeet Latch, MD,as directed by  Skeet Latch, MD while in the presence of Skeet Latch, MD.  I, [place] Oval Linsey, MD have reviewed all documentation for this visit.  The documentation of the exam, diagnosis, procedures, and orders on 06/15/2021 are all accurate and complete.   Signed, Skeet Latch, MD  06/15/2021 4:29 PM    [place]

## 2021-06-15 NOTE — Assessment & Plan Note (Signed)
Stable hyponatremia.

## 2021-06-20 ENCOUNTER — Encounter (HOSPITAL_BASED_OUTPATIENT_CLINIC_OR_DEPARTMENT_OTHER): Payer: Self-pay

## 2021-06-26 ENCOUNTER — Other Ambulatory Visit: Payer: Self-pay | Admitting: Internal Medicine

## 2021-06-26 DIAGNOSIS — I6523 Occlusion and stenosis of bilateral carotid arteries: Secondary | ICD-10-CM

## 2021-06-26 DIAGNOSIS — E785 Hyperlipidemia, unspecified: Secondary | ICD-10-CM

## 2021-06-30 ENCOUNTER — Ambulatory Visit (INDEPENDENT_AMBULATORY_CARE_PROVIDER_SITE_OTHER): Payer: Medicare PPO

## 2021-06-30 ENCOUNTER — Other Ambulatory Visit: Payer: Self-pay

## 2021-06-30 DIAGNOSIS — M79604 Pain in right leg: Secondary | ICD-10-CM | POA: Diagnosis not present

## 2021-06-30 DIAGNOSIS — R0789 Other chest pain: Secondary | ICD-10-CM | POA: Diagnosis not present

## 2021-06-30 DIAGNOSIS — I350 Nonrheumatic aortic (valve) stenosis: Secondary | ICD-10-CM

## 2021-06-30 DIAGNOSIS — I6523 Occlusion and stenosis of bilateral carotid arteries: Secondary | ICD-10-CM | POA: Diagnosis not present

## 2021-06-30 DIAGNOSIS — M79605 Pain in left leg: Secondary | ICD-10-CM | POA: Diagnosis not present

## 2021-06-30 DIAGNOSIS — I10 Essential (primary) hypertension: Secondary | ICD-10-CM | POA: Diagnosis not present

## 2021-06-30 LAB — ECHOCARDIOGRAM COMPLETE
AR max vel: 2.12 cm2
AV Area VTI: 2.31 cm2
AV Area mean vel: 2.06 cm2
AV Mean grad: 18.8 mmHg
AV Peak grad: 34.3 mmHg
AV Vena cont: 0.23 cm
Ao pk vel: 2.93 m/s
Area-P 1/2: 2.08 cm2
Calc EF: 67.3 %
MV VTI: 4.09 cm2
P 1/2 time: 582 msec
S' Lateral: 2.74 cm
Single Plane A2C EF: 61.7 %
Single Plane A4C EF: 71.6 %

## 2021-07-01 ENCOUNTER — Telehealth (HOSPITAL_COMMUNITY): Payer: Self-pay | Admitting: *Deleted

## 2021-07-01 NOTE — Telephone Encounter (Signed)
Close encounter 

## 2021-07-04 ENCOUNTER — Telehealth (HOSPITAL_BASED_OUTPATIENT_CLINIC_OR_DEPARTMENT_OTHER): Payer: Self-pay | Admitting: *Deleted

## 2021-07-04 DIAGNOSIS — I6523 Occlusion and stenosis of bilateral carotid arteries: Secondary | ICD-10-CM

## 2021-07-04 NOTE — Telephone Encounter (Signed)
-----   Message from Skeet Latch, MD sent at 07/04/2021  6:10 AM EDT ----- We were not able to get clear imaging on the left.  Recommend getting a CTA of the neck to better assess the degree of stenosis.

## 2021-07-04 NOTE — Telephone Encounter (Signed)
Left message to call back  

## 2021-07-04 NOTE — Telephone Encounter (Signed)
-----   Message from Skeet Latch, MD sent at 07/04/2021  6:08 AM EDT ----- Study shows that there is blockage in his legs.  The exact severity cannot be determined and may need further testing.  Recommend that he see either Dr. Gwenlyn Found or Dr. Fletcher Anon.

## 2021-07-04 NOTE — Telephone Encounter (Signed)
-----   Message from Skeet Latch, MD sent at 07/04/2021  6:07 AM EDT ----- Echo shows that his had a squeezing well.  Mild aortic stenosis.  Very slightly increased from prior but still in a range where we need to just keep monitoring it.

## 2021-07-06 ENCOUNTER — Ambulatory Visit (HOSPITAL_COMMUNITY)
Admission: RE | Admit: 2021-07-06 | Discharge: 2021-07-06 | Disposition: A | Discharge status: Home / Self Care | Payer: Medicare PPO | Source: Ambulatory Visit | Attending: Cardiology | Admitting: Cardiology

## 2021-07-06 ENCOUNTER — Other Ambulatory Visit: Payer: Self-pay

## 2021-07-06 DIAGNOSIS — R0789 Other chest pain: Secondary | ICD-10-CM | POA: Insufficient documentation

## 2021-07-06 LAB — MYOCARDIAL PERFUSION IMAGING
LV dias vol: 114 mL (ref 62–150)
LV sys vol: 36 mL
Nuc Stress EF: 68 %
Peak HR: 94 {beats}/min
Rest HR: 57 {beats}/min
Rest Nuclear Isotope Dose: 10.5 mCi
SDS: 1
SRS: 0
SSS: 1
ST Depression (mm): 0 mm
Stress Nuclear Isotope Dose: 32.6 mCi
TID: 1.01

## 2021-07-06 MED ORDER — REGADENOSON 0.4 MG/5ML IV SOLN
0.4000 mg | Freq: Once | INTRAVENOUS | Status: AC
  Administered 2021-07-06: 0.4 mg via INTRAVENOUS

## 2021-07-06 MED ORDER — TECHNETIUM TC 99M TETROFOSMIN IV KIT
10.5000 | PACK | Freq: Once | INTRAVENOUS | Status: AC | PRN
  Administered 2021-07-06: 10.5 via INTRAVENOUS
  Filled 2021-07-06: qty 11

## 2021-07-06 MED ORDER — TECHNETIUM TC 99M TETROFOSMIN IV KIT
32.6000 | PACK | Freq: Once | INTRAVENOUS | Status: AC | PRN
  Administered 2021-07-06: 32.6 via INTRAVENOUS
  Filled 2021-07-06: qty 33

## 2021-07-07 DIAGNOSIS — H6123 Impacted cerumen, bilateral: Secondary | ICD-10-CM | POA: Diagnosis not present

## 2021-07-12 ENCOUNTER — Ambulatory Visit: Payer: Medicare PPO | Admitting: Cardiovascular Disease

## 2021-07-15 ENCOUNTER — Ambulatory Visit: Payer: Medicare PPO | Admitting: Pharmacist

## 2021-07-15 ENCOUNTER — Other Ambulatory Visit: Payer: Self-pay

## 2021-07-15 VITALS — BP 166/82 | HR 68 | Resp 14 | Ht 67.0 in | Wt 164.8 lb

## 2021-07-15 DIAGNOSIS — E785 Hyperlipidemia, unspecified: Secondary | ICD-10-CM | POA: Diagnosis not present

## 2021-07-15 DIAGNOSIS — I10 Essential (primary) hypertension: Secondary | ICD-10-CM | POA: Diagnosis not present

## 2021-07-15 MED ORDER — TELMISARTAN 80 MG PO TABS
80.0000 mg | ORAL_TABLET | Freq: Every day | ORAL | 1 refills | Status: DC
Start: 2021-07-15 — End: 2022-01-11

## 2021-07-15 NOTE — Telephone Encounter (Signed)
Patient aware of results  He has appointment with Dr Gwenlyn Found 11/2 Order placed for CT and message sent to scheduling to arrange

## 2021-07-15 NOTE — Patient Instructions (Signed)
It was nice meeting you today  We would like your blood pressure to be less than 130/80  Please take your blood pressure at home and call me with your results  Continue your amlodipine 5mg  daily and your telmisartan 40mg  daily  I put in lab orders for you.  You can have them drawn any time next week.  Continue your exercise regimen  Try to cut down on your caffeine intake at home  Karren Cobble, PharmD, [place], [place], [place], [place], [place], [address] Phone: [phone], Fax: [phone]

## 2021-07-15 NOTE — Progress Notes (Signed)
Patient ID: Brian Hunt                 DOB: 08-Jun-1946                      MRN: [id number]     HPI: Brian Hunt is a 75 y.o. male referred by Dr. Oval Linsey to HTN clinic. PMH is significant for HTN, carotid stenosis, PAD, and HLD.  Patient seen by Dr Oval Linsey on 9/28 and lower extremity doppler, EKG, echo, and carotid artery ultrasound was ordered.  Patient presents today anxious regarding diagnoses and has many questions regarding PAD. Has existing referral to Dr Gwenlyn Found for consult.  Reports has a growth on his lower great toe which worries him may be related.  Brought BP log.  Many BP readings ar home at or near goal:  10/20: 120/65 10/18: 125/77, 144/79 10/17: 124/61, 124/70 10/16: 130/80, 127/65  Quit smoking about a month ago. Is physcialy active at the gym 3 days a week using resistance bands and weights.  Reports does not do many cardio exercises.  Diet is heart healthy. Typically eats salads, vegetables, proteins, and fruits.  However, drinks 10 cups of coffee every day. Quit drinking alcohol 27 years ago.   Patient reports after his stress test with dye, had severe rectal bleeding.  Possibly from hemorrhoids.  Would like to have lab work updated sooner than next follow up.  Current HTN meds:  Telmisartan 40mg   Amlodipine 5mg   BP goal: <130/80  Home BP readings: 140/74 hr 62  Wt Readings from Last 3 Encounters:  07/06/21 165 lb (74.8 kg)  06/15/21 165 lb 1.6 oz (74.9 kg)  03/09/21 164 lb (74.4 kg)   BP Readings from Last 3 Encounters:  06/15/21 (!) 154/78  03/09/21 136/78  10/15/20 130/80   Pulse Readings from Last 3 Encounters:  06/15/21 60  03/09/21 60  10/15/20 68    Renal function: CrCl cannot be calculated (Patient's most recent lab result is older than the maximum 21 days allowed.).  Past Medical History:  Diagnosis Date   Anemia    Anxiety    Arthritis    Basal cell carcinoma    Basal Cell X2 , Dr Radford Pax   BPH (benign prostatic  hyperplasia)    Bursitis of left hip    Dr. Ihor Gully   Carotid artery occlusion    Carotid stenosis 04/12/2034   Complication of anesthesia    pt. states he had vasal vagal reaction after last surgery,after he got to his room   COPD (chronic obstructive pulmonary disease) ([place])    Diverticulosis    Dyspnea    with exertion   Elevated PSA    Heart murmur    Diagnosed 03/2018.   Hyperlipidemia    Hypertension    Internal hemorrhoids    Leg pain 06/15/2021   Prostatitis    Dr Terance Hart   [facility]     Current Outpatient Medications on File Prior to Visit  Medication Sig Dispense Refill   amLODipine (NORVASC) 5 MG tablet TAKE 1 TABLET(5 MG) BY MOUTH DAILY 90 tablet 1   aspirin EC 81 MG tablet Take 81 mg by mouth daily.     Multiple Vitamins-Minerals (PRESERVISION AREDS 2 PO) Take 1 tablet by mouth daily.      rosuvastatin (CRESTOR) 5 MG tablet TAKE 1 TABLET EVERY DAY 90 tablet 1   tadalafil (CIALIS) 5 MG tablet Take 1 tablet (5 mg total) by mouth daily as  needed for erectile dysfunction. 30 tablet 11   telmisartan (MICARDIS) 40 MG tablet TAKE 1 TABLET BY MOUTH EVERY DAY 90 tablet 0   No current facility-administered medications on file prior to visit.    Allergies  Allergen Reactions   Demeclocycline Nausea Only    Nausea and constipation   Pravastatin Other (See Comments)    myopathy   Lisinopril Cough   Oxycodone     Mental confusion     Assessment/Plan:  1. Hypertension -  Patient BP in room today 166/82 however patient is anxious.  Asked patient to recheck BP at home and call with result before deciding on increasing any HTN medications.  Looked at growth on underside of patient's great toe.  Appears to look more like a wart, however recommended he follow up with his PCP or dermatologist.  Discussed pathophysiology of PAD and importance of cholesterol and HTN control.  Patient concerned to wait until December to see Dr Gwenlyn Found.  Will walk patient to front desk to see if there  are earlier openings.    Recommended patient reduce caffeine intake or switch to decaf or half caf.  Patient called back when he returned home. Home BP 140/74.  Will increase telmisartan to 80mg  once daily and continue amlodipine 10mg  daily.  Karren Cobble, PharmD, BCACP, [place], [place], [place], [place], [address] Phone: [phone], Fax: [phone]

## 2021-07-15 NOTE — Telephone Encounter (Signed)
Spoke to patient he stated he was calling [name] back.Stated he has been out of town.Brian Hunt is in clinic today.I will send message to her.

## 2021-07-15 NOTE — Telephone Encounter (Signed)
Patient is returning call.  °

## 2021-07-18 ENCOUNTER — Telehealth (HOSPITAL_BASED_OUTPATIENT_CLINIC_OR_DEPARTMENT_OTHER): Payer: Self-pay | Admitting: Cardiovascular Disease

## 2021-07-18 DIAGNOSIS — E785 Hyperlipidemia, unspecified: Secondary | ICD-10-CM | POA: Diagnosis not present

## 2021-07-18 DIAGNOSIS — I10 Essential (primary) hypertension: Secondary | ICD-10-CM | POA: Diagnosis not present

## 2021-07-18 LAB — COMPREHENSIVE METABOLIC PANEL
ALT: 15 IU/L (ref 0–44)
AST: 20 IU/L (ref 0–40)
Albumin/Globulin Ratio: 1.3 (ref 1.2–2.2)
Albumin: 4.2 g/dL (ref 3.7–4.7)
Alkaline Phosphatase: 72 IU/L (ref 44–121)
BUN/Creatinine Ratio: 17 (ref 10–24)
BUN: 15 mg/dL (ref 8–27)
Bilirubin Total: 0.5 mg/dL (ref 0.0–1.2)
CO2: 24 mmol/L (ref 20–29)
Calcium: 9.5 mg/dL (ref 8.6–10.2)
Chloride: 99 mmol/L (ref 96–106)
Creatinine, Ser: 0.9 mg/dL (ref 0.76–1.27)
Globulin, Total: 3.3 g/dL (ref 1.5–4.5)
Glucose: 89 mg/dL (ref 70–99)
Potassium: 5.4 mmol/L — ABNORMAL HIGH (ref 3.5–5.2)
Sodium: 134 mmol/L (ref 134–144)
Total Protein: 7.5 g/dL (ref 6.0–8.5)
eGFR: 89 mL/min/{1.73_m2} (ref >59)

## 2021-07-18 LAB — CBC WITH DIFFERENTIAL/PLATELET
Basophils Absolute: 0.1 10*3/uL (ref 0.0–0.2)
Basos: 1 %
EOS (ABSOLUTE): 0.3 10*3/uL (ref 0.0–0.4)
Eos: 4 %
Hematocrit: 42.6 % (ref 37.5–51.0)
Hemoglobin: 14.4 g/dL (ref 13.0–17.7)
Immature Grans (Abs): 0 10*3/uL (ref 0.0–0.1)
Immature Granulocytes: 0 %
Lymphocytes Absolute: 1.2 10*3/uL (ref 0.7–3.1)
Lymphs: 20 %
MCH: 31.9 pg (ref 26.6–33.0)
MCHC: 33.8 g/dL (ref 31.5–35.7)
MCV: 94 fL (ref 79–97)
Monocytes Absolute: 0.8 10*3/uL (ref 0.1–0.9)
Monocytes: 13 %
Neutrophils Absolute: 3.8 10*3/uL (ref 1.4–7.0)
Neutrophils: 62 %
Platelets: 329 10*3/uL (ref 150–450)
RBC: 4.52 x10E6/uL (ref 4.14–5.80)
RDW: 11.5 % — ABNORMAL LOW (ref 11.6–15.4)
WBC: 6 10*3/uL (ref 3.4–10.8)

## 2021-07-18 LAB — LIPID PANEL
Chol/HDL Ratio: 2.2 ratio (ref 0.0–5.0)
Cholesterol, Total: 134 mg/dL (ref 100–199)
HDL: 61 mg/dL (ref >39)
LDL Chol Calc (NIH): 63 mg/dL (ref 0–99)
Triglycerides: 39 mg/dL (ref 0–149)
VLDL Cholesterol Cal: 10 mg/dL (ref 5–40)

## 2021-07-18 LAB — HEMOGLOBIN A1C
Est. average glucose Bld gHb Est-mCnc: 123 mg/dL
Hgb A1c MFr Bld: 5.9 % — ABNORMAL HIGH (ref 4.8–5.6)

## 2021-07-18 NOTE — Telephone Encounter (Signed)
Spoke with patient regarding the Friday 07/22/21 11:00 am CTA neck appointment at [address]--ground floor radiology---arrival time is 10:45 am for check in and liquids only 4 hours prior to study.  Patient states he had his lab work completed today.

## 2021-07-20 ENCOUNTER — Encounter: Payer: Self-pay | Admitting: Cardiovascular Disease

## 2021-07-20 ENCOUNTER — Ambulatory Visit: Payer: Medicare PPO | Admitting: Cardiovascular Disease

## 2021-07-20 ENCOUNTER — Other Ambulatory Visit: Payer: Self-pay

## 2021-07-20 VITALS — BP 134/66 | HR 68 | Ht 67.0 in | Wt 165.0 lb

## 2021-07-20 DIAGNOSIS — M79604 Pain in right leg: Secondary | ICD-10-CM | POA: Diagnosis not present

## 2021-07-20 DIAGNOSIS — M79605 Pain in left leg: Secondary | ICD-10-CM

## 2021-07-20 MED ORDER — CILOSTAZOL 50 MG PO TABS: 50.0000 mg | ORAL_TABLET | Freq: Two times a day (BID) | ORAL | 3 refills | Status: DC

## 2021-07-20 NOTE — Progress Notes (Signed)
07/20/2021 Brian Hunt   [birth year]  [id number]  Primary Physician Janith Lima, MD Primary Cardiologist: Lorretta Harp MD Lupe Carney, [place]  HPI:  Brian Hunt is a 75 y.o. 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  He was referred by Dr. Oval Linsey, his cardiologist, for symptomatic claudication.  His risk factors include 50 pack years of tobacco abuse having quit 2 months ago, treated hypertension and hyperlipidemia.  There is no family history for heart disease.  Is never had a heart attack or stroke.  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  He works out 3 times a week doing [employer].  He says that his calfs cause him discomfort when he walks and he has to stop periodically.  He had Doppler studies performed in our office 06/30/2021 revealing occluded SFAs bilaterally with a right ABI of 0.72 and a left of 0.66.  He also has bilateral carotid bruits and potentially an occluded carotid artery.   Current Meds  Medication Sig   amLODipine (NORVASC) 5 MG tablet TAKE 1 TABLET(5 MG) BY MOUTH DAILY   aspirin EC 81 MG tablet Take 81 mg by mouth daily.   cilostazol (PLETAL) 50 MG tablet Take 1 tablet (50 mg total) by mouth 2 (two) times daily.   Multiple Vitamins-Minerals (PRESERVISION AREDS 2 PO) Take 1 tablet by mouth daily.    rosuvastatin (CRESTOR) 5 MG tablet TAKE 1 TABLET EVERY DAY   tadalafil (CIALIS) 5 MG tablet Take 1 tablet (5 mg total) by mouth daily as needed for erectile dysfunction.   telmisartan (MICARDIS) 80 MG tablet Take 1 tablet (80 mg total) by mouth daily.     Allergies  Allergen Reactions   Demeclocycline Nausea Only    Nausea and constipation   Pravastatin Other (See Comments)    myopathy   Lisinopril Cough   Oxycodone     Mental confusion    Social History    Socioeconomic History   Marital status: Married    Spouse name: Not on file   Number of children: 2   Years of education: Not on file   Highest education level: Not on file  Occupational History   Occupation: retired  Tobacco Use   Smoking status: Former    Packs/day: 0.00    Years: 40.00    Pack years: 0.00    Types: Cigarettes    Quit date: 06/14/2021    Years since quitting: 0.0   Smokeless tobacco: Never  Vaping Use   Vaping Use: Never used  Substance and Sexual Activity   Alcohol use: No    Comment: 2001- states no rehab   Drug use: No   Sexual activity: Not on file  Other Topics Concern   Not on file  Social History Narrative   Not on file   Social Determinants of Health   Financial Resource Strain: Low Risk    Difficulty of Paying Living Expenses: Not hard at all  Food Insecurity: No Food Insecurity   Worried About Charity fundraiser in the Last Year: Never true   [place] in the Last Year: Never true  Transportation Needs: No Transportation Needs   Lack of Transportation (Medical): No   Lack of Transportation (Non-Medical): No  Physical Activity: Insufficiently Active   Days of Exercise per  Week: 3 days   Minutes of Exercise per Session: 40 min  Stress: No Stress Concern Present   Feeling of Stress : Not at all  Social Connections: Socially Integrated   Frequency of Communication with Friends and Family: More than three times a week   Frequency of Social Gatherings with Friends and Family: Once a week   Attends Religious Services: More than 4 times per year   Active Member of [employer] or Organizations: Yes   Attends Music therapist: More than 4 times per year   Marital Status: Married  Human resources officer Violence: Not on file     Review of Systems: General: negative for chills, fever, night sweats or weight changes.  Cardiovascular: negative for chest pain, dyspnea on exertion, edema, orthopnea, palpitations, paroxysmal nocturnal  dyspnea or shortness of breath Dermatological: negative for rash Respiratory: negative for cough or wheezing Urologic: negative for hematuria Abdominal: negative for nausea, vomiting, diarrhea, bright red blood per rectum, melena, or hematemesis Neurologic: negative for visual changes, syncope, or dizziness All other systems reviewed and are otherwise negative except as noted above.    Blood pressure 134/66, pulse 68, height 5\' 7"  (1.702 m), weight 165 lb (74.8 kg).  General appearance: alert and no distress Neck: no adenopathy, no JVD, supple, symmetrical, trachea midline, thyroid not enlarged, symmetric, no tenderness/mass/nodules, and bilateral carotid bruits Lungs: clear to auscultation bilaterally Heart: 2/6 outflow tract murmur consistent with aortic stenosis Extremities: extremities normal, atraumatic, no cyanosis or edema Pulses: 2+ and symmetric Diminished pedal pulses Skin: Skin color, texture, turgor normal. No rashes or lesions Neurologic: Grossly normal  EKG not performed today  ASSESSMENT AND PLAN:   Leg pain Mr. Mizrahi was referred to me by Dr. Oval Linsey for evaluation of claudication.  He has had this for last several years or so.  The right leg is worse than the left leg.  He does have risk factors for PAD.  He had Doppler studies performed in our office 06/30/2021 revealing a right ABI of 0.72 and a left of 0.66.  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  He has intact femoral pulses.  I am going to get aortoiliac Doppler studies and begin him on Pletal 50 mg p.o. twice daily.  We will see him back in 3 months to assess efficacy and decide whether to proceed with angiography and intervention.     Lorretta Harp MD FACP,FACC,FAHA, [facility] 07/20/2021 11:07 AM

## 2021-07-20 NOTE — Assessment & Plan Note (Signed)
Brian Hunt was referred to me by Dr. Oval Linsey for evaluation of claudication.  He has had this for last several years or so.  The right leg is worse than the left leg.  He does have risk factors for PAD.  He had Doppler studies performed in our office 06/30/2021 revealing a right ABI of 0.72 and a left of 0.66.  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  He has intact femoral pulses.  I am going to get aortoiliac Doppler studies and begin him on Pletal 50 mg p.o. twice daily.  We will see him back in 3 months to assess efficacy and decide whether to proceed with angiography and intervention.

## 2021-07-20 NOTE — Patient Instructions (Signed)
Medication Instructions:   -Start Cilostazol (pletal) 50mg  twice daily.  *If you need a refill on your cardiac medications before your next appointment, please call your pharmacy*   Testing/Procedures: Dr. Gwenlyn Found has recommended that you have an Ultrasound of your AORTA/IVC/ILIACS.   To prepare for this test:  No food after 11PM the night before. Water is OK. (Don't drink liquids if you have been instructed not to for ANOTHER test).  Avoid foods that produce bowel gas, for 24 hours prior to exam (see below). No breakfast, no chewing gum, no smoking or carbonated beverages. Patient may take morning medications with water. Come in for test at least 15 minutes early to register.    Follow-Up: At [facility], you and your health needs are our priority.  As part of our continuing mission to provide you with exceptional heart care, we have created designated Provider Care Teams.  These Care Teams include your primary Cardiologist (physician) and Advanced Practice Providers (APPs -  Physician Assistants and Nurse Practitioners) who all work together to provide you with the care you need, when you need it.  We recommend signing up for the patient portal called "MyChart".  Sign up information is provided on this After Visit Summary.  MyChart is used to connect with patients for Virtual Visits (Telemedicine).  Patients are able to view lab/test results, encounter notes, upcoming appointments, etc.  Non-urgent messages can be sent to your provider as well.   To learn more about what you can do with MyChart, go to [email or web address].    Your next appointment:   3 month(s)  The format for your next appointment:   In Person  Provider:   Quay Burow, MD

## 2021-07-22 ENCOUNTER — Other Ambulatory Visit: Payer: Self-pay

## 2021-07-22 ENCOUNTER — Ambulatory Visit (HOSPITAL_BASED_OUTPATIENT_CLINIC_OR_DEPARTMENT_OTHER)
Admission: RE | Admit: 2021-07-22 | Discharge: 2021-07-22 | Disposition: A | Discharge status: Home / Self Care | Payer: Medicare PPO | Source: Ambulatory Visit | Attending: Cardiovascular Disease | Admitting: Cardiovascular Disease

## 2021-07-22 DIAGNOSIS — I6523 Occlusion and stenosis of bilateral carotid arteries: Secondary | ICD-10-CM | POA: Diagnosis not present

## 2021-07-22 MED ORDER — IOHEXOL 350 MG/ML SOLN
75.0000 mL | Freq: Once | INTRAVENOUS | Status: AC | PRN
  Administered 2021-07-22: 75 mL via INTRAVENOUS

## 2021-07-29 ENCOUNTER — Other Ambulatory Visit (HOSPITAL_BASED_OUTPATIENT_CLINIC_OR_DEPARTMENT_OTHER): Payer: Self-pay | Admitting: *Deleted

## 2021-07-29 DIAGNOSIS — E875 Hyperkalemia: Secondary | ICD-10-CM

## 2021-08-03 ENCOUNTER — Other Ambulatory Visit: Payer: Self-pay

## 2021-08-03 ENCOUNTER — Ambulatory Visit (HOSPITAL_COMMUNITY)
Admission: RE | Admit: 2021-08-03 | Discharge: 2021-08-03 | Disposition: A | Discharge status: Home / Self Care | Payer: Medicare PPO | Source: Ambulatory Visit | Attending: Cardiovascular Disease | Admitting: Cardiovascular Disease

## 2021-08-03 DIAGNOSIS — M79605 Pain in left leg: Secondary | ICD-10-CM

## 2021-08-03 DIAGNOSIS — M79604 Pain in right leg: Secondary | ICD-10-CM

## 2021-08-08 ENCOUNTER — Other Ambulatory Visit (HOSPITAL_BASED_OUTPATIENT_CLINIC_OR_DEPARTMENT_OTHER): Payer: Self-pay | Admitting: Cardiovascular Disease

## 2021-08-08 DIAGNOSIS — I6522 Occlusion and stenosis of left carotid artery: Secondary | ICD-10-CM

## 2021-08-23 ENCOUNTER — Ambulatory Visit: Payer: Medicare PPO | Admitting: Cardiovascular Disease

## 2021-09-08 DIAGNOSIS — E875 Hyperkalemia: Secondary | ICD-10-CM | POA: Diagnosis not present

## 2021-09-08 LAB — BASIC METABOLIC PANEL
BUN/Creatinine Ratio: 21 (ref 10–24)
BUN: 18 mg/dL (ref 8–27)
CO2: 21 mmol/L (ref 20–29)
Calcium: 9.4 mg/dL (ref 8.6–10.2)
Chloride: 96 mmol/L (ref 96–106)
Creatinine, Ser: 0.85 mg/dL (ref 0.76–1.27)
Glucose: 77 mg/dL (ref 70–99)
Potassium: 5 mmol/L (ref 3.5–5.2)
Sodium: 132 mmol/L — ABNORMAL LOW (ref 134–144)
eGFR: 91 mL/min/{1.73_m2} (ref >59)

## 2021-10-03 ENCOUNTER — Encounter: Payer: Self-pay | Admitting: Internal Medicine

## 2021-10-03 DIAGNOSIS — H02831 Dermatochalasis of right upper eyelid: Secondary | ICD-10-CM | POA: Diagnosis not present

## 2021-10-03 DIAGNOSIS — H25813 Combined forms of age-related cataract, bilateral: Secondary | ICD-10-CM | POA: Diagnosis not present

## 2021-10-03 DIAGNOSIS — H04123 Dry eye syndrome of bilateral lacrimal glands: Secondary | ICD-10-CM | POA: Diagnosis not present

## 2021-10-03 DIAGNOSIS — H524 Presbyopia: Secondary | ICD-10-CM | POA: Diagnosis not present

## 2021-10-03 DIAGNOSIS — H353132 Nonexudative age-related macular degeneration, bilateral, intermediate dry stage: Secondary | ICD-10-CM | POA: Diagnosis not present

## 2021-10-04 DIAGNOSIS — L814 Other melanin hyperpigmentation: Secondary | ICD-10-CM | POA: Diagnosis not present

## 2021-10-04 DIAGNOSIS — L84 Corns and callosities: Secondary | ICD-10-CM | POA: Diagnosis not present

## 2021-10-04 DIAGNOSIS — L821 Other seborrheic keratosis: Secondary | ICD-10-CM | POA: Diagnosis not present

## 2021-10-04 DIAGNOSIS — D1801 Hemangioma of skin and subcutaneous tissue: Secondary | ICD-10-CM | POA: Diagnosis not present

## 2021-10-04 DIAGNOSIS — L570 Actinic keratosis: Secondary | ICD-10-CM | POA: Diagnosis not present

## 2021-10-04 DIAGNOSIS — Z85828 Personal history of other malignant neoplasm of skin: Secondary | ICD-10-CM | POA: Diagnosis not present

## 2021-10-07 DIAGNOSIS — N401 Enlarged prostate with lower urinary tract symptoms: Secondary | ICD-10-CM | POA: Diagnosis not present

## 2021-10-07 DIAGNOSIS — N5201 Erectile dysfunction due to arterial insufficiency: Secondary | ICD-10-CM | POA: Diagnosis not present

## 2021-10-07 DIAGNOSIS — R351 Nocturia: Secondary | ICD-10-CM | POA: Diagnosis not present

## 2021-10-07 DIAGNOSIS — N486 Induration penis plastica: Secondary | ICD-10-CM | POA: Diagnosis not present

## 2021-10-07 LAB — PSA: PSA: 3.62

## 2021-10-14 ENCOUNTER — Telehealth: Payer: Self-pay | Admitting: Internal Medicine

## 2021-10-14 NOTE — Telephone Encounter (Signed)
N/A unable to leave a message for patient to call back to schedule Medicare Annual Wellness Visit   Last AWV  10/15/20  Please schedule at anytime with [facility] if patient calls the office back.    40 Minutes appointment   Any questions, please call me at [phone]

## 2021-10-17 ENCOUNTER — Ambulatory Visit (HOSPITAL_BASED_OUTPATIENT_CLINIC_OR_DEPARTMENT_OTHER): Payer: Medicare PPO | Admitting: Cardiovascular Disease

## 2021-10-17 ENCOUNTER — Other Ambulatory Visit: Payer: Self-pay

## 2021-10-17 ENCOUNTER — Encounter (HOSPITAL_BASED_OUTPATIENT_CLINIC_OR_DEPARTMENT_OTHER): Payer: Self-pay | Admitting: Cardiovascular Disease

## 2021-10-17 VITALS — BP 128/64 | HR 96 | Ht 67.0 in | Wt 168.0 lb

## 2021-10-17 DIAGNOSIS — E785 Hyperlipidemia, unspecified: Secondary | ICD-10-CM

## 2021-10-17 DIAGNOSIS — I350 Nonrheumatic aortic (valve) stenosis: Secondary | ICD-10-CM | POA: Diagnosis not present

## 2021-10-17 DIAGNOSIS — I739 Peripheral vascular disease, unspecified: Secondary | ICD-10-CM | POA: Diagnosis not present

## 2021-10-17 DIAGNOSIS — Z87891 Personal history of nicotine dependence: Secondary | ICD-10-CM

## 2021-10-17 HISTORY — DX: Peripheral vascular disease, unspecified: I73.9

## 2021-10-17 NOTE — Patient Instructions (Signed)
Medication Instructions:  Your physician recommends that you continue on your current medications as directed. Please refer to the Current Medication list given to you today.   *If you need a refill on your cardiac medications before your next appointment, please call your pharmacy*  Lab Work: NONE  Testing/Procedures: Your physician has requested that you have an echocardiogram. Echocardiography is a painless test that uses sound waves to create images of your heart. It provides your doctor with information about the size and shape of your heart and how well your hearts chambers and valves are working. This procedure takes approximately one hour. There are no restrictions for this procedure. TO BE DONE January 2024  Follow-Up: At [facility], you and your health needs are our priority.  As part of our continuing mission to provide you with exceptional heart care, we have created designated Provider Care Teams.  These Care Teams include your primary Cardiologist (physician) and Advanced Practice Providers (APPs -  Physician Assistants and Nurse Practitioners) who all work together to provide you with the care you need, when you need it.  We recommend signing up for the patient portal called "MyChart".  Sign up information is provided on this After Visit Summary.  MyChart is used to connect with patients for Virtual Visits (Telemedicine).  Patients are able to view lab/test results, encounter notes, upcoming appointments, etc.  Non-urgent messages can be sent to your provider as well.   To learn more about what you can do with MyChart, go to [email or web address].    Your next appointment:   6 month(s)  The format for your next appointment:   In Person  Provider:   Skeet Latch, MD

## 2021-10-17 NOTE — Assessment & Plan Note (Signed)
BP averaging 394V/20Q but diastolic has been as low as the 40s.  Given that outcomes are worse when diastolic blood pressures are less than 60 mmHg, we will not titrate his medications at this time.  We will keep working on diet and exercise.  He was congratulated on his regular efforts and smoking cessation.  Continue amlodipine and telmisartan.

## 2021-10-17 NOTE — Assessment & Plan Note (Signed)
Congratulated on smoking cessation.  

## 2021-10-17 NOTE — Assessment & Plan Note (Signed)
Mean gradient was 18 mmHg on echo 06/2021.  He is asymptomatic.  Repeat echo in a year.

## 2021-10-17 NOTE — Progress Notes (Signed)
Cardiology Office Note:    Date:  10/17/2021   ID:  Brian Hunt, DOB [birth year], MRN [id number]  PCP:  Janith Lima, MD   [facility] Providers Cardiologist:  None     Referring MD: Janith Lima, MD   No chief complaint on file.   History of Present Illness:    Brian Hunt is a 76 y.o. male with a hx of anemia, anxiety, arthritis, basal cell carcinoma, BPH, carotid artery occlusion, complication of anesthesia (vasal vagal reaction), COPD, diverticulosis, DOE, mild aortic stenosis, hyperlipidemia, hypertension, and RBBB, here to establish care in the [facility]. He saw Dr. Ronnald Ramp 02/2021, and his blood pressure was 136/78. He reported that his blood pressure had been well controlled. Mild aortic stenosis was also noted and he was referred to cardiology. He previously had an echo 02/2018 with LVEF 42-35%, grade 1 diastolic dysfunction, and mild stenosis  with a mean gradient of 13 mm HG.  At his last visit, he complained of bilateral LE pain. He had ABIs 06/2021 that revealed moderate disease bilaterally. Arterial dopplers showed 30-49% stenosis in the L common femoral and total occlusion of the L SFA. There was irregular plaque on the right that could not be quantified. Carotid dopplers showed moderate stenosis in the bilateral ICAs. Echo 06/2021 revealed LVEF 60-65% and mild aortic stenosis. He was referred to Dr. Gwenlyn Found and was started on Pletal 07/2021. They made plans to re-evaluate for angiography in 3 months. He stopped smoking 06/2021.   Today, he is doing well. He records his blood pressure at home which ranges from 361 to 443X systolic and 40 to 54M diastolic. On average, his blood pressure is about 086P systolic. He notices his blood pressure is higher in the morning. He takes his medications in the morning. He is doing well on the Pletal and has had less pain and discomfort in his bilateral LE. Due to the decreased pain, he has been exercising and walking  more. However, he endorses heartburn with Pletal which improves with Tums.  For exercise, he performs weight training and cardiovascular activities. He goes to the [facility] in [place] and uses home equipment. He reports using salt in his cooking and enjoys red meat. However, he tries to eat fruit and vegetables daily and monitor his portions. Of note, he typically goes to sleep at 10PM and wakes at [facility].  He denies any palpitations, shortness of breath, lightheadedness, headaches, syncope, orthopnea, PND, lower extremity edema, claudication, bleeding easily, or exertional symptoms.  Past Medical History:  Diagnosis Date   Anemia    Anxiety    Arthritis    Basal cell carcinoma    Basal Cell X2 , Dr Radford Pax   BPH (benign prostatic hyperplasia)    Bursitis of left hip    Dr. Ihor Gully   Carotid artery occlusion    Carotid stenosis 03/06/5092   Complication of anesthesia    pt. states he had vasal vagal reaction after last surgery,after he got to his room   COPD (chronic obstructive pulmonary disease) ([place])    Diverticulosis    Dyspnea    with exertion   Elevated PSA    Heart murmur    Diagnosed 03/2018.   Hyperlipidemia    Hypertension    Internal hemorrhoids    Leg pain 06/15/2021   PAD (peripheral artery disease) ([place]) 10/17/2021   Prostatitis    Dr Terance Hart   RBBB     Past Surgical History:  Procedure Laterality Date  COLONOSCOPY  2010   negative; [facility]   CYSTOSCOPY  10/2014   [facility]; neg   dislocation of right hip  2019   GREEN LIGHT LASER TURP (TRANSURETHRAL RESECTION OF PROSTATE  09/27/2012   Procedure: GREEN LIGHT LASER TURP (TRANSURETHRAL RESECTION OF PROSTATE;  Surgeon: Fredricka Bonine, MD;  Location: [facility];  Service: Urology;  Laterality: N/A;      JOINT REPLACEMENT Left Oct. 12, 2012   left total hip replacement by Dr. Mayer Camel   PROSTATE BIOPSY  2007 , 2009   X2 , Dr Thomasene Mohair  & Dr Terance Hart   PROSTATE SURGERY     Laser   TOTAL HIP ARTHROPLASTY Right  09/26/2017   TOTAL HIP ARTHROPLASTY Right 09/26/2017   Procedure: TOTAL HIP ARTHROPLASTY ANTERIOR APPROACH;  Surgeon: Frederik Pear, MD;  Location: [facility];  Service: Orthopedics;  Laterality: Right;    Current Medications: Current Meds  Medication Sig   amLODipine (NORVASC) 5 MG tablet TAKE 1 TABLET(5 MG) BY MOUTH DAILY   aspirin EC 81 MG tablet Take 81 mg by mouth daily.   cilostazol (PLETAL) 50 MG tablet Take 1 tablet (50 mg total) by mouth 2 (two) times daily.   Multiple Vitamins-Minerals (PRESERVISION AREDS 2 PO) Take 1 tablet by mouth daily.    rosuvastatin (CRESTOR) 5 MG tablet TAKE 1 TABLET EVERY DAY   tadalafil (CIALIS) 5 MG tablet Take 1 tablet (5 mg total) by mouth daily as needed for erectile dysfunction.   telmisartan (MICARDIS) 80 MG tablet Take 1 tablet (80 mg total) by mouth daily.     Allergies:   Demeclocycline, Pravastatin, Lisinopril, and Oxycodone   Social History   Socioeconomic History   Marital status: Married    Spouse name: Not on file   Number of children: 2   Years of education: Not on file   Highest education level: Not on file  Occupational History   Occupation: retired  Tobacco Use   Smoking status: Former    Packs/day: 0.00    Years: 40.00    Pack years: 0.00    Types: Cigarettes    Quit date: 06/14/2021    Years since quitting: 0.3   Smokeless tobacco: Never  Vaping Use   Vaping Use: Never used  Substance and Sexual Activity   Alcohol use: No    Comment: 2001- states no rehab   Drug use: No   Sexual activity: Not on file  Other Topics Concern   Not on file  Social History Narrative   Not on file   Social Determinants of Health   Financial Resource Strain: Not on file  Food Insecurity: Not on file  Transportation Needs: Not on file  Physical Activity: Insufficiently Active   Days of Exercise per Week: 3 days   Minutes of Exercise per Session: 40 min  Stress: Not on file  Social Connections: Not on file     Family History: The  patient's family history includes Arthritis in his mother; Bone cancer in his maternal grandmother; COPD in his father; Colon cancer (age of onset: 4) in his son; Heart attack (age of onset: 63) in his paternal grandmother; Heart disease in his mother; Hyperlipidemia in his mother; Hypertension in his mother; Stroke in his mother; Transient ischemic attack in his mother; Turner syndrome in his daughter; Vascular Disease in his mother. There is no history of Asthma, Diabetes, Pancreatic cancer, Esophageal cancer, Stomach cancer, Liver disease, or Rectal cancer.  ROS:   Please see the history  of present illness.    (+) Acid reflux All other systems reviewed and are negative.  EKGs/Labs/Other Studies Reviewed:    The following studies were reviewed today: CTA Neck 07/22/21 1. Chronic atherosclerosis. Progressed chronic plaque at the Left ICA origin and bulb since 2012, with stenosis there now numerically estimated at 66%. Brachiocephalic artery origin stenosis estimated at 73%. But no other hemodynamically significant stenosis in the neck.   2. Aortic Atherosclerosis (ICD10-I70.0) and Emphysema (ICD10-J43.9).  Stress Test 07/06/21   The study is normal. The study is low risk.   RBBB with repolarization changes was present at baseline with no ST changes from baseline during infusion.  Occsaional PVCs were noted.   LV perfusion is normal. There is no evidence of ischemia. There is no evidence of infarction.   Left ventricular function is normal. End diastolic cavity size is normal. End systolic cavity size is normal.   Prior study not available for comparison.  Carotid Duplex 06/30/21 Right Carotid: Velocities in the right ICA are consistent with a  stable1-39% stenosis.   Left Carotid: Velocities in the left ICA are consistent with a 1-39%  stenosis, see note above.                If clinically indicated, consider alternate imaging.   Vertebrals:  Bilateral vertebral arteries  demonstrate antegrade flow.  Subclavians: Normal flow hemodynamics were seen in bilateral subclavian arteries.   LE Duplex 06/30/21 Right: Heterogenous irregular calcific plaque throughout extremity. Areas  of shadowing plaque, can not rule out higher grade stenosis or occlusion  within. Waveform changes to monophasic proximal/mid SFA most likley  indicating stenosis more proximally.   Left: 30-49% stenosis noted in the common femoral artery. Total occlusion  noted in the mid superficial femoral artery. Heterogenous irregular  calcific plaque throughout extremity. Areas of shadowing plaque, can not  rule out higher grade stenosis or  occlusion within.   LE ABI 06/30/21 Right: Resting right ankle-brachial index indicates moderate right lower  extremity arterial disease. The right toe-brachial index is abnormal.   Left: Resting left ankle-brachial index indicates moderate left lower  extremity arterial disease. The left toe-brachial index is abnormal.   Echo 06/30/21  1. Left ventricular ejection fraction, by estimation, is 60 to 65%. The  left ventricle has normal function. The left ventricle has no regional  wall motion abnormalities. There is mild concentric left ventricular  hypertrophy. The average left ventricular   global longitudinal strain is -18.0 %.   2. Right ventricular systolic function is normal. The right ventricular  size is normal.   3. Left atrial size was moderately dilated.   4. No evidence of mitral valve regurgitation. No evidence of mitral  stenosis. Moderate mitral annular calcification.   5. No pulmonary hypertension.   6. The aortic valve is normal in structure. Aortic valve regurgitation is  mild. Mild aortic valve stenosis.   7. The inferior vena cava is normal in size with greater than 50%  respiratory variability, suggesting right atrial pressure of 3 mmHg.   Comparison(s): EF 65%, mild AS peak gradient 27 mmHg mean gradient 13  mmHg.   CT Chest  04/12/2021: COMPARISON:  09/02/2019   FINDINGS: Cardiovascular: Heart size is normal. Advanced coronary artery calcification. Aortic atherosclerotic calcification. No aneurysmal dilatation. No pericardial fluid.   Mediastinum/Nodes: No mediastinal mass or lymphadenopathy. Chronic thyroid nodule on the right, maximal dimension 19 mm, unchanged since 20/12 and considered benign.   Lungs/Pleura: Emphysema, upper lobe predominant. No evidence  of infiltrate or collapse. No pleural effusion. 4 mm pulmonary nodule in the right upper lobe axial image 50 remains unchanged. Few tiny perifissural nodules and subpleural nodules are unchanged. No new or enlarging findings.   Upper Abdomen: Negative   Musculoskeletal: Ordinary thoracic degenerative change.   IMPRESSION: Stable examination. Background emphysema. Stable small pulmonary nodules that do not require further follow-up. See above for full discussion.   Aortic Atherosclerosis (ICD10-I70.0) and Emphysema (ICD10-J43.9). Coronary artery calcification present extensively.   19 mm right thyroid nodule, unchanged since 20/12 and considered benign. No follow-up recommended unless clinically warranted.    TTE 03/07/2018: - Left ventricle: The cavity size was normal. There was mild focal    basal hypertrophy of the septum. Systolic function was vigorous.    The estimated ejection fraction was in the range of 65% to 70%.    Wall motion was normal; there were no regional wall motion    abnormalities. Doppler parameters are consistent with abnormal    left ventricular relaxation (grade 1 diastolic dysfunction).  - Aortic valve: Valve mobility was restricted. There was mild    stenosis. Peak velocity (S): 259 cm/s. Mean gradient (S): 13 mm    Hg.  - Mitral valve: Calcified annulus. Mildly thickened leaflets .  - Left atrium: The atrium was mildly dilated. Volume/bsa, ES,    (1-plane Simpson&'s, A2C): 36.8 ml/m^2.  - Tricuspid valve: There  was mild regurgitation.  - Pulmonary arteries: Systolic pressure was mildly increased. PA    peak pressure: 37 mm Hg (S).    EKG:   EKG was not ordered today 06/15/2021: Sinus rhythm. Rate 60 bpm. PVCs. RBBB.  Recent Labs: 03/09/2021: TSH 2.57 07/18/2021: ALT 15; Hemoglobin 14.4; Platelets 329 09/08/2021: BUN 18; Creatinine, Ser 0.85; Potassium 5.0; Sodium 132   Recent Lipid Panel    Component Value Date/Time   CHOL 134 07/18/2021 0832   TRIG 39 07/18/2021 0832   HDL 61 07/18/2021 0832   CHOLHDL 2.2 07/18/2021 0832   CHOLHDL 3 06/23/2020 1512   VLDL 7.8 06/23/2020 1512   LDLCALC 63 07/18/2021 0832          Physical Exam:    Wt Readings from Last 3 Encounters:  10/17/21 168 lb (76.2 kg)  07/20/21 165 lb (74.8 kg)  07/15/21 164 lb 12.8 oz (74.8 kg)    VS:  BP 128/64 (BP Location: Left Arm, Patient Position: Sitting, Cuff Size: Normal)    Pulse 96    Ht 5\' 7"  (1.702 m)    Wt 168 lb (76.2 kg)    SpO2 95%    BMI 26.31 kg/m  , BMI Body mass index is 26.31 kg/m. GENERAL:  Well appearing HEENT: Pupils equal round and reactive, fundi not visualized, oral mucosa unremarkable NECK:  No jugular venous distention, waveform within normal limits, carotid upstroke brisk and symmetric, R carotid bruit, no thyromegaly LUNGS:  Clear to auscultation bilaterally HEART:  RRR.  PMI not displaced or sustained,S1 and S2 within normal limits, no S3, no S4, no clicks, no rubs, III/VI mid-peaking systolic murmur at the LUSB ABD:  Flat, positive bowel sounds normal in frequency in pitch, no bruits, no rebound, no guarding, no midline pulsatile mass, no hepatomegaly, no splenomegaly EXT:  2 plus pulses throughout, no edema, no cyanosis no clubbing SKIN:  No rashes no nodules NEURO:  Cranial nerves II through XII grossly intact, motor grossly intact throughout PSYCH:  Cognitively intact, oriented to person place and time    ASSESSMENT:  1. Aortic stenosis, mild   2. Ex-smoker   3.  Hyperlipidemia with target LDL less than 70   4. PAD (peripheral artery disease) (HCC)     PLAN:    HTN (hypertension) BP averaging 998P/38S but diastolic has been as low as the 40s.  Given that outcomes are worse when diastolic blood pressures are less than 60 mmHg, we will not titrate his medications at this time.  We will keep working on diet and exercise.  He was congratulated on his regular efforts and smoking cessation.  Continue amlodipine and telmisartan.  Aortic stenosis, mild Mean gradient was 18 mmHg on echo 06/2021.  He is asymptomatic.  Repeat echo in a year.  Ex-smoker Congratulated on smoking cessation.  Hyperlipidemia with target LDL less than 70 Lipids are at goal.  LDL is under 70 on rosuvastatin 5 mg daily.  Continue with diet and exercise.  PAD (peripheral artery disease) (HCC) Bilateral SFA occlusion.  Also has left mild to moderate stenosis in the femoral artery.  Symptoms have been improving with addition of Pletal, though he does note acid reflux.  Continue aspirin.  He has follow-up scheduled with Dr. Gwenlyn Found to discuss.  Recommend that he continue with his regular exercise program.  Lipids are well controlled.  Disposition: FU with Dena Esperanza C. Oval Linsey, MD, [facility] in 6 months  Medication Adjustments/Labs and Tests Ordered: Current medicines are reviewed at length with the patient today.  Concerns regarding medicines are outlined above.   Orders Placed This Encounter  Procedures   ECHOCARDIOGRAM COMPLETE    No orders of the defined types were placed in this encounter.   Patient Instructions  Medication Instructions:  Your physician recommends that you continue on your current medications as directed. Please refer to the Current Medication list given to you today.   *If you need a refill on your cardiac medications before your next appointment, please call your pharmacy*  Lab Work: NONE  Testing/Procedures: Your physician has requested that you have an  echocardiogram. Echocardiography is a painless test that uses sound waves to create images of your heart. It provides your doctor with information about the size and shape of your heart and how well your hearts chambers and valves are working. This procedure takes approximately one hour. There are no restrictions for this procedure. TO BE DONE January 2024  Follow-Up: At [facility], you and your health needs are our priority.  As part of our continuing mission to provide you with exceptional heart care, we have created designated Provider Care Teams.  These Care Teams include your primary Cardiologist (physician) and Advanced Practice Providers (APPs -  Physician Assistants and Nurse Practitioners) who all work together to provide you with the care you need, when you need it.  We recommend signing up for the patient portal called "MyChart".  Sign up information is provided on this After Visit Summary.  MyChart is used to connect with patients for Virtual Visits (Telemedicine).  Patients are able to view lab/test results, encounter notes, upcoming appointments, etc.  Non-urgent messages can be sent to your provider as well.   To learn more about what you can do with MyChart, go to [email or web address].    Your next appointment:   6 month(s)  The format for your next appointment:   In Person  Provider:   Skeet Latch, MD      Wilhemina Bonito as a scribe for Skeet Latch, MD.,have documented all relevant documentation on the behalf of Skeet Latch, MD,as directed  by  Skeet Latch, MD while in the presence of Skeet Latch, MD.  I, Miner Oval Linsey, MD have reviewed all documentation for this visit.  The documentation of the exam, diagnosis, procedures, and orders on 10/17/2021 are all accurate and complete.  Time spent: 42 minutes-Greater than 50% of this time was spent in counseling, explanation of diagnosis, planning of further management, and coordination  of care.   Signed, Skeet Latch, MD  10/17/2021 9:33 AM    [place]

## 2021-10-17 NOTE — Assessment & Plan Note (Signed)
Bilateral SFA occlusion.  Also has left mild to moderate stenosis in the femoral artery.  Symptoms have been improving with addition of Pletal, though he does note acid reflux.  Continue aspirin.  He has follow-up scheduled with Dr. Gwenlyn Found to discuss.  Recommend that he continue with his regular exercise program.  Lipids are well controlled.

## 2021-10-17 NOTE — Assessment & Plan Note (Signed)
Lipids are at goal.  LDL is under 70 on rosuvastatin 5 mg daily.  Continue with diet and exercise.

## 2021-11-08 ENCOUNTER — Other Ambulatory Visit: Payer: Self-pay

## 2021-11-08 ENCOUNTER — Encounter: Payer: Self-pay | Admitting: Cardiovascular Disease

## 2021-11-08 ENCOUNTER — Ambulatory Visit: Payer: Medicare PPO | Admitting: Cardiovascular Disease

## 2021-11-08 VITALS — BP 120/60 | HR 70 | Ht 67.0 in | Wt 168.0 lb

## 2021-11-08 DIAGNOSIS — I6522 Occlusion and stenosis of left carotid artery: Secondary | ICD-10-CM | POA: Diagnosis not present

## 2021-11-08 DIAGNOSIS — I739 Peripheral vascular disease, unspecified: Secondary | ICD-10-CM | POA: Diagnosis not present

## 2021-11-08 DIAGNOSIS — E785 Hyperlipidemia, unspecified: Secondary | ICD-10-CM

## 2021-11-08 NOTE — Patient Instructions (Signed)

## 2021-11-08 NOTE — Assessment & Plan Note (Signed)
History of PAD with lifestyle limiting claudication in the past.  He had lower extremity arterial Dopplers that did not show any aortoiliac disease on 08/03/2021 but there was bilateral SFA disease with a right ABI of 0.72 and a left of 0.66.  I did begin him on Pletal twice daily which afforded him significant improvement in his claudication.  He said initially he could only walk 100 yards and now he can walk 2 to 300 yards without having to stop because of discomfort.  We talked about an invasive approach versus continued medical therapy and exercise.  He prefers the latter approach.  I am going to see him back in 6 months.  Between now and then he decides that he wishes intervention we will schedule.

## 2021-11-08 NOTE — Assessment & Plan Note (Signed)
History of carotid artery disease by duplex ultrasound 06/30/2021 with what appeared to be at least moderate left ICA stenosis.  The patient did have a CT angiogram ordered by Dr. Oval Linsey on 07/22/2021 which showed a 66% stenosis in the left ICA.  There was also a brachiocephalic stenosis at the origin.  Dr. Oval Linsey will continue to follow this.

## 2021-11-08 NOTE — Progress Notes (Signed)
11/08/2021 Brian Hunt   [birth year]  [id number]  Primary Physician Janith Lima, MD Primary Cardiologist: Lorretta Harp MD Lupe Carney, [place]  HPI:  Brian Hunt is a 76 y.o.   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  He was referred by Dr. Oval Linsey, his cardiologist, for symptomatic claudication.  I last saw him in the office 07/20/2021 his risk factors include 50 pack years of tobacco abuse having quit 2 months ago, treated hypertension and hyperlipidemia.  There is no family history for heart disease.  Is never had a heart attack or stroke.  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  He works out 3 times a week doing [employer].  He says that his calfs cause him discomfort when he walks and he has to stop periodically.  He had Doppler studies performed in our office 06/30/2021 revealing occluded SFAs bilaterally with a right ABI of 0.72 and a left of 0.66.  He also has bilateral carotid bruits and potentially an occluded carotid artery.  I began him on Pletal 50 mg p.o. twice daily which has afforded him significant improvement in his claudication symptoms.  He is not completely back to where he wants to be but is able to walk 2-3 times as long than previously before having to stop.  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   Current Meds  Medication Sig   amLODipine (NORVASC) 5 MG tablet TAKE 1 TABLET(5 MG) BY MOUTH DAILY   aspirin EC 81 MG tablet Take 81 mg by mouth daily.   cilostazol (PLETAL) 50 MG tablet Take 1 tablet (50 mg total) by mouth 2 (two) times daily.   Multiple Vitamins-Minerals (PRESERVISION AREDS 2 PO) Take 1 tablet by mouth daily.    rosuvastatin (CRESTOR) 5 MG tablet TAKE 1 TABLET EVERY DAY    tadalafil (CIALIS) 5 MG tablet Take 1 tablet (5 mg total) by mouth daily as needed for erectile dysfunction.   telmisartan (MICARDIS) 80 MG tablet Take 1 tablet (80 mg total) by mouth daily.     Allergies  Allergen Reactions   Demeclocycline Nausea Only    Nausea and constipation   Pravastatin Other (See Comments)    myopathy   Lisinopril Cough   Oxycodone     Mental confusion    Social History   Socioeconomic History   Marital status: Married    Spouse name: Not on file   Number of children: 2   Years of education: Not on file   Highest education level: Not on file  Occupational History   Occupation: retired  Tobacco Use   Smoking status: Former    Packs/day: 0.00    Years: 40.00    Pack years: 0.00    Types: Cigarettes    Quit date: 06/14/2021    Years since quitting: 0.4   Smokeless tobacco: Never  Vaping Use   Vaping Use: Never used  Substance and Sexual Activity   Alcohol use: No    Comment: 2001- states no rehab   Drug use: No   Sexual activity: Not on file  Other Topics Concern   Not on file  Social History Narrative   Not on file   Social Determinants of Health  Financial Resource Strain: Not on file  Food Insecurity: Not on file  Transportation Needs: Not on file  Physical Activity: Insufficiently Active   Days of Exercise per Week: 3 days   Minutes of Exercise per Session: 40 min  Stress: Not on file  Social Connections: Not on file  Intimate Partner Violence: Not on file     Review of Systems: General: negative for chills, fever, night sweats or weight changes.  Cardiovascular: negative for chest pain, dyspnea on exertion, edema, orthopnea, palpitations, paroxysmal nocturnal dyspnea or shortness of breath Dermatological: negative for rash Respiratory: negative for cough or wheezing Urologic: negative for hematuria Abdominal: negative for nausea, vomiting, diarrhea, bright red blood per rectum, melena, or hematemesis Neurologic: negative for  visual changes, syncope, or dizziness All other systems reviewed and are otherwise negative except as noted above.    Blood pressure 120/60, pulse 70, height 5\' 7"  (1.702 m), weight 168 lb (76.2 kg).  General appearance: alert and no distress Neck: no adenopathy, no JVD, supple, symmetrical, trachea midline, thyroid not enlarged, symmetric, no tenderness/mass/nodules, and bilateral carotid bruits Lungs: clear to auscultation bilaterally Heart: 2/6 outflow tract murmur consistent with aortic stenosis Extremities: extremities normal, atraumatic, no cyanosis or edema Pulses: Diminished pedal pulses Skin: Skin color, texture, turgor normal. No rashes or lesions Neurologic: Grossly normal  EKG sinus rhythm at 70 with right bundle branch block and occasional PVCs.  I personally reviewed this EKG.    ASSESSMENT AND PLAN:   PAD (peripheral artery disease) (HCC) History of PAD with lifestyle limiting claudication in the past.  He had lower extremity arterial Dopplers that did not show any aortoiliac disease on 08/03/2021 but there was bilateral SFA disease with a right ABI of 0.72 and a left of 0.66.  I did begin him on Pletal twice daily which afforded him significant improvement in his claudication.  He said initially he could only walk 100 yards and now he can walk 2 to 300 yards without having to stop because of discomfort.  We talked about an invasive approach versus continued medical therapy and exercise.  He prefers the latter approach.  I am going to see him back in 6 months.  Between now and then he decides that he wishes intervention we will schedule.  Carotid stenosis History of carotid artery disease by duplex ultrasound 06/30/2021 with what appeared to be at least moderate left ICA stenosis.  The patient did have a CT angiogram ordered by Dr. Oval Linsey on 07/22/2021 which showed a 66% stenosis in the left ICA.  There was also a brachiocephalic stenosis at the origin.  Dr. Oval Linsey will  continue to follow this.     Lorretta Harp MD FACP,FACC,FAHA, [facility] 11/08/2021 9:16 AM

## 2021-11-12 ENCOUNTER — Other Ambulatory Visit: Payer: Self-pay | Admitting: Internal Medicine

## 2021-11-12 DIAGNOSIS — I10 Essential (primary) hypertension: Secondary | ICD-10-CM

## 2021-11-12 MED ORDER — AMLODIPINE BESYLATE 5 MG PO TABS: ORAL_TABLET | ORAL | 0 refills | Status: DC

## 2021-11-17 ENCOUNTER — Other Ambulatory Visit: Payer: Self-pay

## 2021-11-17 ENCOUNTER — Ambulatory Visit: Payer: Medicare PPO | Admitting: Internal Medicine

## 2021-11-17 ENCOUNTER — Encounter: Payer: Self-pay | Admitting: Internal Medicine

## 2021-11-17 VITALS — BP 136/80 | HR 69 | Temp 97.9°F | Ht 67.0 in | Wt 166.0 lb

## 2021-11-17 DIAGNOSIS — E785 Hyperlipidemia, unspecified: Secondary | ICD-10-CM

## 2021-11-17 DIAGNOSIS — I739 Peripheral vascular disease, unspecified: Secondary | ICD-10-CM | POA: Diagnosis not present

## 2021-11-17 DIAGNOSIS — I6523 Occlusion and stenosis of bilateral carotid arteries: Secondary | ICD-10-CM | POA: Diagnosis not present

## 2021-11-17 DIAGNOSIS — Z0000 Encounter for general adult medical examination without abnormal findings: Secondary | ICD-10-CM

## 2021-11-17 DIAGNOSIS — Z23 Encounter for immunization: Secondary | ICD-10-CM

## 2021-11-17 DIAGNOSIS — E538 Deficiency of other specified B group vitamins: Secondary | ICD-10-CM

## 2021-11-17 LAB — CBC WITH DIFFERENTIAL/PLATELET
Basophils Absolute: 0.1 10*3/uL (ref 0.0–0.1)
Basophils Relative: 1.2 % (ref 0.0–3.0)
Eosinophils Absolute: 0.5 10*3/uL (ref 0.0–0.7)
Eosinophils Relative: 8.2 % — ABNORMAL HIGH (ref 0.0–5.0)
HCT: 40 % (ref 39.0–52.0)
Hemoglobin: 13.5 g/dL (ref 13.0–17.0)
Lymphocytes Relative: 23.9 % (ref 12.0–46.0)
Lymphs Abs: 1.5 10*3/uL (ref 0.7–4.0)
MCHC: 33.9 g/dL (ref 30.0–36.0)
MCV: 95.9 fl (ref 78.0–100.0)
Monocytes Absolute: 0.9 10*3/uL (ref 0.1–1.0)
Monocytes Relative: 14.2 % — ABNORMAL HIGH (ref 3.0–12.0)
Neutro Abs: 3.3 10*3/uL (ref 1.4–7.7)
Neutrophils Relative %: 52.5 % (ref 43.0–77.0)
Platelets: 320 10*3/uL (ref 150.0–400.0)
RBC: 4.17 Mil/uL — ABNORMAL LOW (ref 4.22–5.81)
RDW: 13.4 % (ref 11.5–15.5)
WBC: 6.3 10*3/uL (ref 4.0–10.5)

## 2021-11-17 LAB — VITAMIN B12: Vitamin B-12: 470 pg/mL (ref 211–911)

## 2021-11-17 LAB — FOLATE: Folate: 18.4 ng/mL (ref >5.9)

## 2021-11-17 MED ORDER — SHINGRIX 50 MCG/0.5ML IM SUSR
0.5000 mL | Freq: Once | INTRAMUSCULAR | 1 refills | Status: AC
Start: 2021-11-17 — End: 2021-11-17

## 2021-11-17 MED ORDER — ROSUVASTATIN CALCIUM 5 MG PO TABS: 5.0000 mg | ORAL_TABLET | Freq: Every day | ORAL | 1 refills | Status: DC

## 2021-11-17 NOTE — Progress Notes (Signed)
Subjective:  Patient ID: Brian Hunt, male    DOB: 03/24/1946  Age: 76 y.o. MRN: [id number]  CC: Annual Exam, Hypertension, and Hyperlipidemia  This visit occurred during the SARS-CoV-2 public health emergency.  Safety protocols were in place, including screening questions prior to the visit, additional usage of staff PPE, and extensive cleaning of exam room while observing appropriate contact time as indicated for disinfecting solutions.    HPI [facility] presents for a CPX and f/up -   Since I last saw him he has seen cardiology and vascular surgery.  He has been diagnosed with PAOD.  He complains of claudication and says he may soon undergo a vascular procedure.  He denies chest pain, shortness of breath, diaphoresis, dizziness, or lightheadedness.  Outpatient Medications Prior to Visit  Medication Sig Dispense Refill   amLODipine (NORVASC) 5 MG tablet TAKE 1 TABLET(5 MG) BY MOUTH DAILY 90 tablet 0   aspirin EC 81 MG tablet Take 81 mg by mouth daily.     cilostazol (PLETAL) 50 MG tablet Take 1 tablet (50 mg total) by mouth 2 (two) times daily. 180 tablet 3   Multiple Vitamins-Minerals (PRESERVISION AREDS 2 PO) Take 1 tablet by mouth daily.      tadalafil (CIALIS) 5 MG tablet Take 1 tablet (5 mg total) by mouth daily as needed for erectile dysfunction. 30 tablet 11   telmisartan (MICARDIS) 80 MG tablet Take 1 tablet (80 mg total) by mouth daily. 90 tablet 1   rosuvastatin (CRESTOR) 5 MG tablet TAKE 1 TABLET EVERY DAY 90 tablet 1   No facility-administered medications prior to visit.    ROS Review of Systems  Constitutional:  Negative for chills, diaphoresis, fatigue and fever.  HENT: Negative.    Eyes: Negative.   Respiratory:  Negative for cough, chest tightness and wheezing.   Cardiovascular:  Negative for chest pain, palpitations and leg swelling.  Gastrointestinal:  Negative for abdominal pain, constipation, diarrhea, nausea and vomiting.  Endocrine: Negative.    Genitourinary: Negative.  Negative for difficulty urinating.  Musculoskeletal:  Negative for arthralgias and myalgias.  Skin: Negative.  Negative for color change.  Neurological:  Negative for dizziness, weakness, light-headedness and headaches.  Hematological:  Negative for adenopathy. Does not bruise/bleed easily.  Psychiatric/Behavioral: Negative.     Objective:  BP 136/80 (BP Location: Right Arm, Patient Position: Sitting, Cuff Size: Large)    Pulse 69    Temp 97.9 F (36.6 C) (Oral)    Ht 5\' 7"  (1.702 m)    Wt 166 lb (75.3 kg)    SpO2 97%    BMI 26.00 kg/m   BP Readings from Last 3 Encounters:  11/17/21 136/80  11/08/21 120/60  10/17/21 128/64    Wt Readings from Last 3 Encounters:  11/17/21 166 lb (75.3 kg)  11/08/21 168 lb (76.2 kg)  10/17/21 168 lb (76.2 kg)    Physical Exam Vitals reviewed.  Constitutional:      Appearance: Normal appearance.  HENT:     Nose: Nose normal.     Mouth/Throat:     Mouth: Mucous membranes are moist.  Eyes:     General: No scleral icterus.    Conjunctiva/sclera: Conjunctivae normal.  Cardiovascular:     Rate and Rhythm: Normal rate and regular rhythm.     Heart sounds: Murmur heard.  Systolic murmur is present with a grade of 2/6.  No diastolic murmur is present.    No gallop.  Pulmonary:  Effort: Pulmonary effort is normal.     Breath sounds: No stridor. No wheezing, rhonchi or rales.  Abdominal:     General: Abdomen is flat.     Palpations: There is no mass.     Tenderness: There is no abdominal tenderness. There is no guarding.     Hernia: No hernia is present.  Musculoskeletal:        General: No swelling.     Cervical back: Neck supple.     Right lower leg: No edema.     Left lower leg: No edema.  Lymphadenopathy:     Cervical: No cervical adenopathy.  Skin:    General: Skin is warm and dry.     Findings: No rash.  Neurological:     General: No focal deficit present.     Mental Status: He is alert.   Psychiatric:        Mood and Affect: Mood normal.        Behavior: Behavior normal.    Lab Results  Component Value Date   WBC 6.3 11/17/2021   HGB 13.5 11/17/2021   HCT 40.0 11/17/2021   PLT 320.0 11/17/2021   GLUCOSE 77 09/08/2021   CHOL 134 07/18/2021   TRIG 39 07/18/2021   HDL 61 07/18/2021   LDLCALC 63 07/18/2021   ALT 15 07/18/2021   AST 20 07/18/2021   NA 132 (L) 09/08/2021   K 5.0 09/08/2021   CL 96 09/08/2021   CREATININE 0.85 09/08/2021   BUN 18 09/08/2021   CO2 21 09/08/2021   TSH 2.57 03/09/2021   PSA 3.62 10/07/2021   INR 1.10 09/24/2017   HGBA1C 5.9 (H) 07/18/2021    VAS US AORTA/IVC/ILIACS  Result Date: 08/04/2021 ABDOMINAL AORTA STUDY Patient Name:  Brian Hunt  Date of Exam:   08/03/2021 Medical Rec #: [id number]       Accession #:    [id number] Date of Birth: Jul 09, 1946       Patient Gender: M Patient Age:   76 years Exam Location:  [facility] Procedure:      VAS US AORTA/IVC/ILIACS Referring Phys: Roderic Palau BERRY --------------------------------------------------------------------------------  Risk Factors: Hypertension, hyperlipidemia, past history of smoking. Other Factors: Patient has had bilateral calf fatigue after exercising for about                15 minutes. This occurs intermittently over the last 2 years,                right > left. Patient has had both hips replaced and does have                right hip painoccassionally. He denies rest pain. Limitations: Air/bowel gas.  Performing Technologist: Wilkie Aye RVT  Examination Guidelines: A complete evaluation includes B-mode imaging, spectral Doppler, color Doppler, and power Doppler as needed of all accessible portions of each vessel. Bilateral testing is considered an integral part of a complete examination. Limited examinations for reoccurring indications may be performed as noted.  Abdominal Aorta Findings: +-------------+-------+----------+----------+--------+--------+--------+  Location      AP  (cm) Trans (cm) PSV (cm/s) Waveform Thrombus Comments  +-------------+-------+----------+----------+--------+--------+--------+  Proximal                         63                                     +-------------+-------+----------+----------+--------+--------+--------+  Mid                              89                                     +-------------+-------+----------+----------+--------+--------+--------+  Distal        1.40    1.80       103                                    +-------------+-------+----------+----------+--------+--------+--------+  RT CIA Prox                      108        biphasic                    +-------------+-------+----------+----------+--------+--------+--------+  RT CIA Mid                       116        biphasic                    +-------------+-------+----------+----------+--------+--------+--------+  RT CIA Distal                    147        biphasic                    +-------------+-------+----------+----------+--------+--------+--------+  RT EIA Mid                       91         biphasic                    +-------------+-------+----------+----------+--------+--------+--------+  RT EIA Distal                    157        biphasic                    +-------------+-------+----------+----------+--------+--------+--------+  LT CIA Prox                      109        biphasic                    +-------------+-------+----------+----------+--------+--------+--------+  LT CIA Mid                       86         biphasic                    +-------------+-------+----------+----------+--------+--------+--------+  LT CIA Distal                    121        biphasic                    +-------------+-------+----------+----------+--------+--------+--------+  LT EIA Mid                       134        biphasic                    +-------------+-------+----------+----------+--------+--------+--------+  LT EIA Distal                    118        biphasic                     +-------------+-------+----------+----------+--------+--------+--------+ Visualization of the Right EIA Proximal artery, Left CIA Mid artery, Left CIA Proximal artery, Right EIA Mid artery and Proximal Abdominal Aorta was limited.  Summary: Abdominal Aorta: The largest aortic measurement is 1.8 cm. 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 Technically challenging due to flash artifact and bowel gas. IVC/Iliac: There is no evidence of thrombus involving the IVC.  *See table(s) above for measurements and observations.  Electronically signed by Carlyle Dolly MD on 08/04/2021 at 2:03:56 PM.    Final     Assessment & Plan:   Jaire was seen today for annual exam, hypertension and hyperlipidemia.  Diagnoses and all orders for this visit:  PAD (peripheral artery disease) (HCC) -     rosuvastatin (CRESTOR) 5 MG tablet; Take 1 tablet (5 mg total) by mouth daily.  Hyperlipidemia with target LDL less than 70- LDL goal achieved. Doing well on the statin  -     rosuvastatin (CRESTOR) 5 MG tablet; Take 1 tablet (5 mg total) by mouth daily.  Bilateral carotid artery stenosis -     rosuvastatin (CRESTOR) 5 MG tablet; Take 1 tablet (5 mg total) by mouth daily.  Routine general medical examination at a health care facility- Exam completed, labs reviewed, vaccines reviewed and updated, cancer screenings are up-to-date, patient education was given.  B12 deficiency- H&H, B12, and folate are normal. -     CBC with Differential/Platelet; Future -     Vitamin B12; Future -     Folate; Future -     Folate -     Vitamin B12 -     CBC with Differential/Platelet  Need for shingles vaccine -     Zoster Vaccine Adjuvanted [facility]) injection; Inject 0.5 mLs into the muscle once for 1 dose.  Other orders -     Pneumococcal polysaccharide vaccine 23-valent greater than or equal to 2yo subcutaneous/IM   I have changed Jkai F. Clavijo's rosuvastatin. I am also having him  start on Shingrix. Additionally, I am having him maintain his Multiple Vitamins-Minerals (PRESERVISION AREDS 2 PO), aspirin EC, tadalafil, telmisartan, cilostazol, and amLODipine.  Meds ordered this encounter  Medications   rosuvastatin (CRESTOR) 5 MG tablet    Sig: Take 1 tablet (5 mg total) by mouth daily.    Dispense:  90 tablet    Refill:  1   Zoster Vaccine Adjuvanted [facility]) injection    Sig: Inject 0.5 mLs into the muscle once for 1 dose.    Dispense:  0.5 mL    Refill:  1     Follow-up: Return in about 6 months (around 05/20/2022).  Scarlette Calico, MD

## 2021-11-17 NOTE — Patient Instructions (Signed)

## 2021-11-21 ENCOUNTER — Encounter: Payer: Self-pay | Admitting: Internal Medicine

## 2021-11-24 DIAGNOSIS — M19011 Primary osteoarthritis, right shoulder: Secondary | ICD-10-CM | POA: Diagnosis not present

## 2021-12-09 DIAGNOSIS — H6123 Impacted cerumen, bilateral: Secondary | ICD-10-CM | POA: Diagnosis not present

## 2021-12-09 DIAGNOSIS — H903 Sensorineural hearing loss, bilateral: Secondary | ICD-10-CM | POA: Diagnosis not present

## 2022-01-11 ENCOUNTER — Other Ambulatory Visit: Payer: Self-pay | Admitting: Cardiovascular Disease

## 2022-01-11 DIAGNOSIS — I10 Essential (primary) hypertension: Secondary | ICD-10-CM

## 2022-01-14 IMAGING — CT CT CHEST W/O CM
2 of 4 series · 15 of 36 positions shown, 18 images · non-contrast
Comparison: 09/02/2019

CLINICAL DATA: Routine follow-up of multiple lung nodules. Lung
nodule, less than 6 mm, low cancer risk.

EXAM:
CT CHEST WITHOUT CONTRAST
TECHNIQUE: Multidetector CT imaging of the chest was performed following the
standard protocol without IV contrast.

[Series 2: thorax · axial · 0.77mm/px · z∈[-310,-62]mm · 12 of 148 slices shown, 15 images]
[im 12/148  mediastinal]
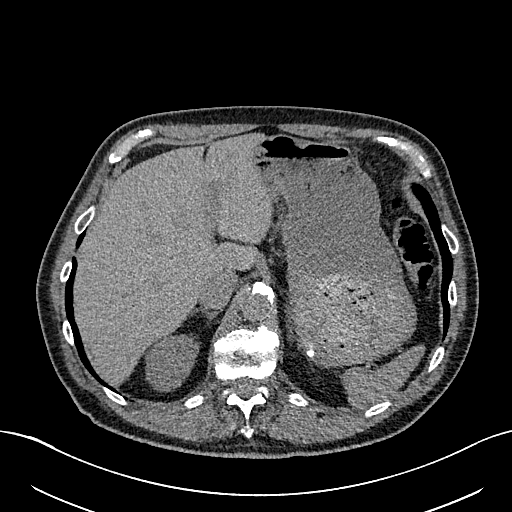
[im 12/148  lung]
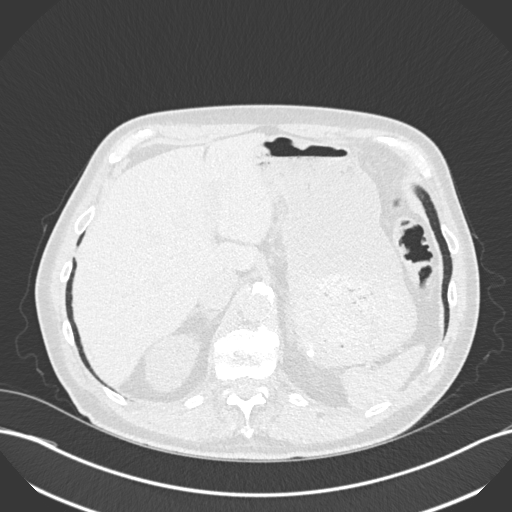
[im 23/148  lung]
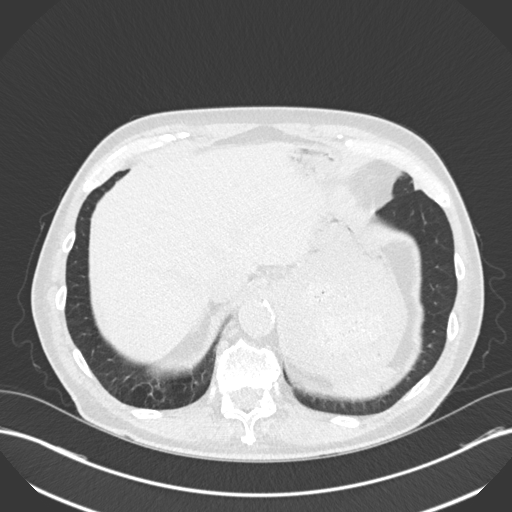
[im 34/148  lung]
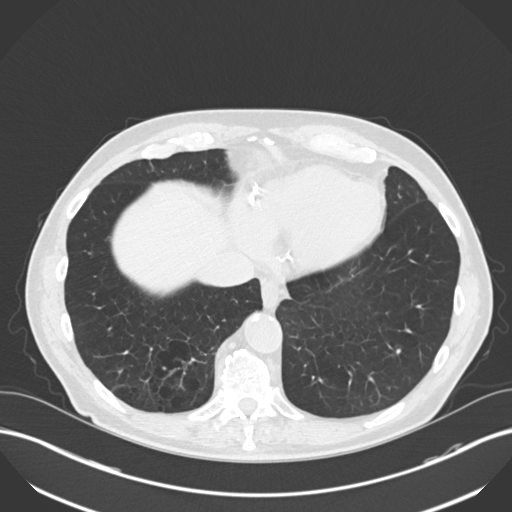
[im 46/148  lung]
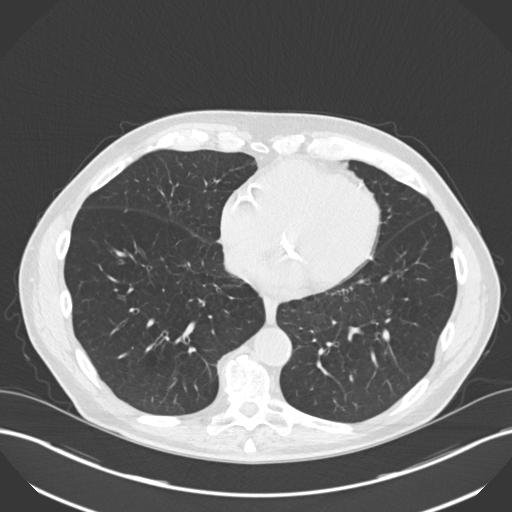
[im 57/148  mediastinal]
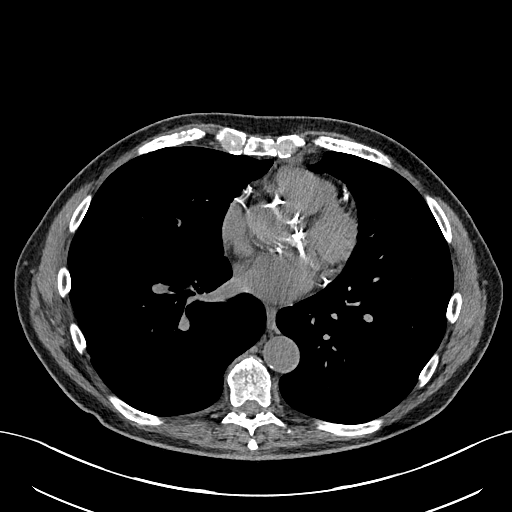
[im 57/148  lung]
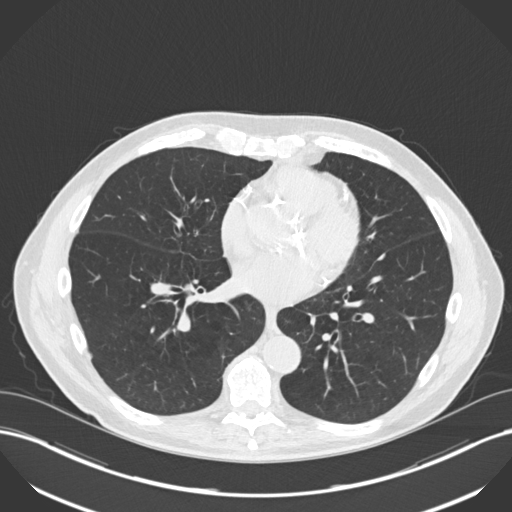
[im 68/148  lung]
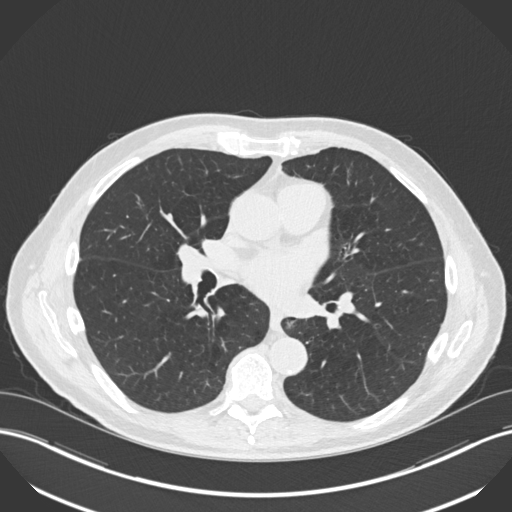
[im 80/148  lung]
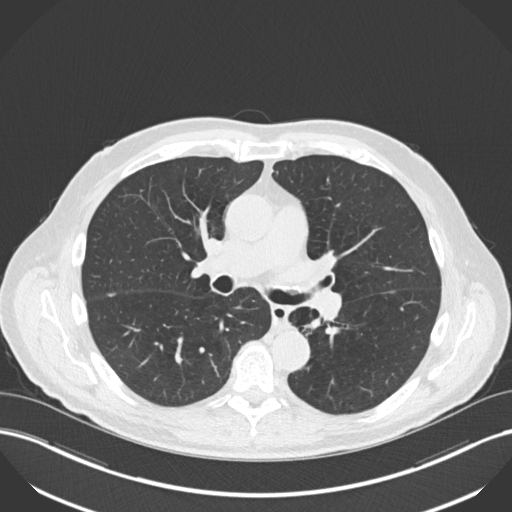
[im 91/148  lung]
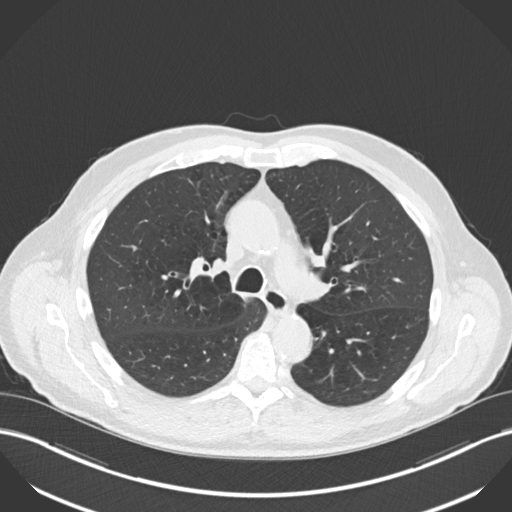
[im 102/148  mediastinal]
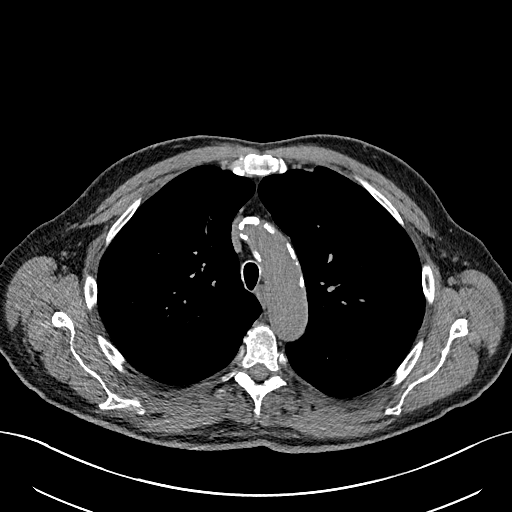
[im 102/148  lung]
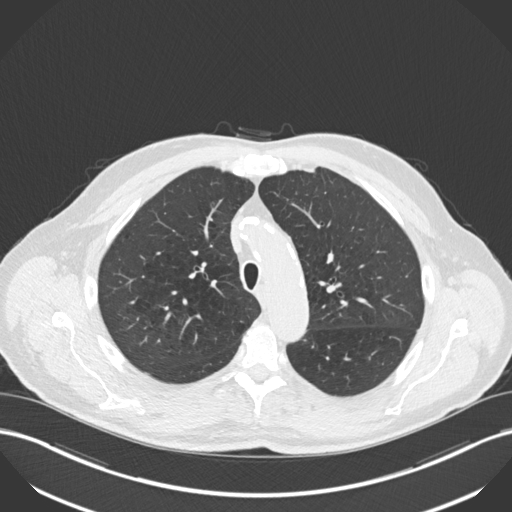
[im 114/148  lung]
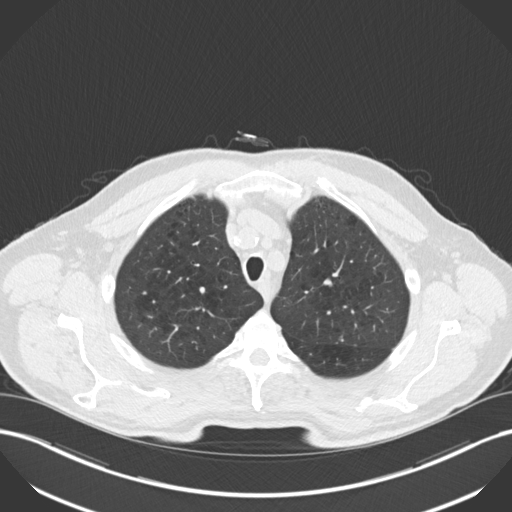
[im 125/148  lung]
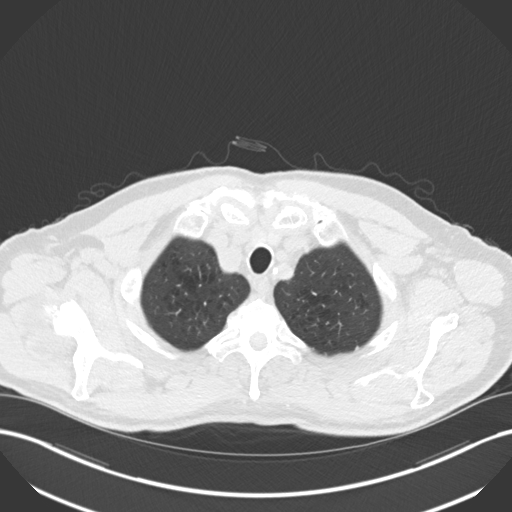
[im 136/148  lung]
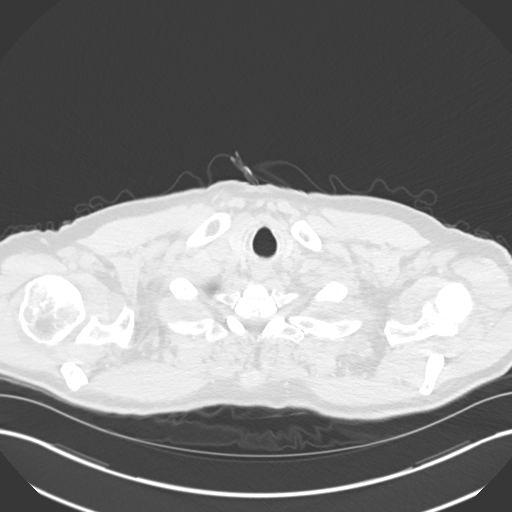

[Series 5: coronal · coronal · 0.59mm/px · 3 of 129 slices shown]
[im 26/129  lung]
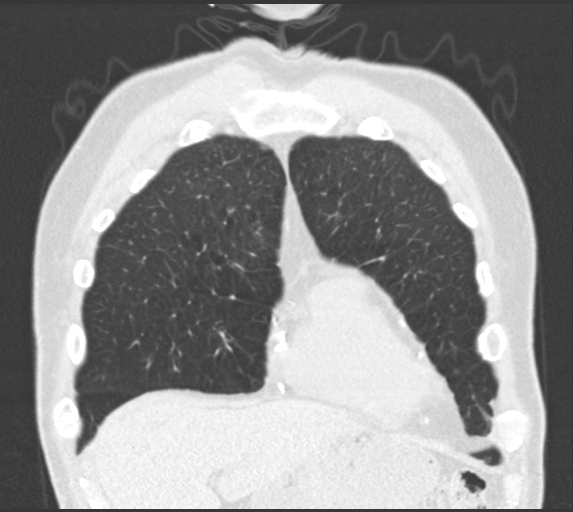
[im 52/129  lung]
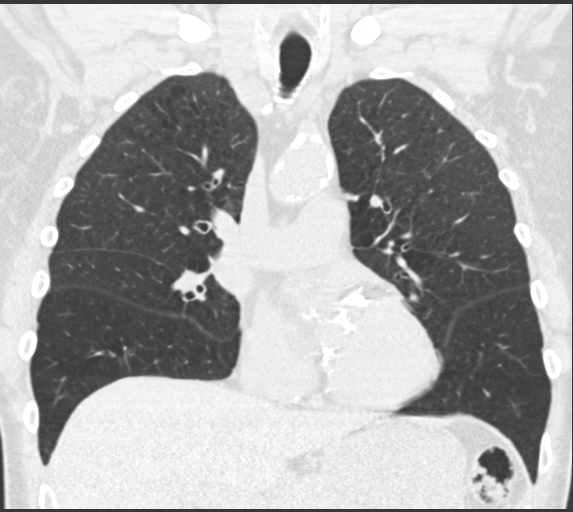
[im 77/129  lung]
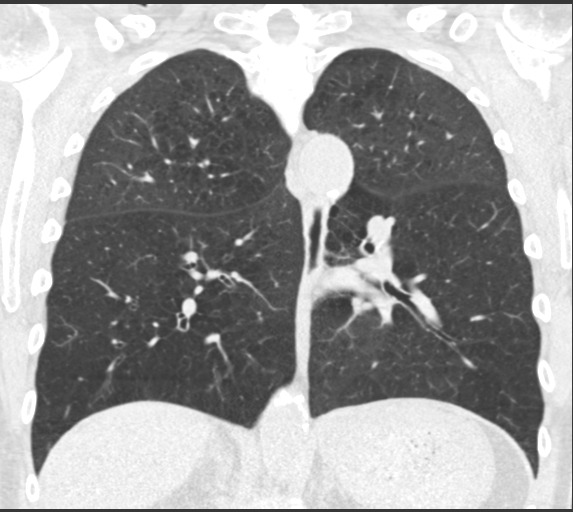

[15 of 36 positions shown; findings below may reference images not displayed]

FINDINGS: Cardiovascular: Heart size is normal. Advanced coronary artery
calcification. Aortic atherosclerotic calcification. No aneurysmal
dilatation. No pericardial fluid.

Mediastinum/Nodes: No mediastinal mass or lymphadenopathy. Chronic
thyroid nodule on the right, maximal dimension 19 mm, unchanged
since [DATE] and considered benign.

Lungs/Pleura: Emphysema, upper lobe predominant. No evidence of
infiltrate or collapse. No pleural effusion. 4 mm pulmonary nodule
in the right upper lobe axial image 50 remains unchanged. Few tiny
perifissural nodules and subpleural nodules are unchanged. No new or
enlarging findings.

Upper Abdomen: Negative

Musculoskeletal: Ordinary thoracic degenerative change.
IMPRESSION: Stable examination. Background emphysema. Stable small pulmonary
nodules that do not require further follow-up. See above for full
discussion.

Aortic Atherosclerosis (6ZVCU-MXX.X) and Emphysema (6ZVCU-S98.2).
Coronary artery calcification present extensively.

19 mm right thyroid nodule, unchanged since [DATE] and considered
benign. No follow-up recommended unless clinically warranted.

## 2022-02-10 ENCOUNTER — Other Ambulatory Visit: Payer: Self-pay | Admitting: Internal Medicine

## 2022-02-10 DIAGNOSIS — I10 Essential (primary) hypertension: Secondary | ICD-10-CM

## 2022-05-01 ENCOUNTER — Ambulatory Visit: Payer: Medicare PPO | Admitting: Cardiovascular Disease

## 2022-05-01 ENCOUNTER — Encounter: Payer: Self-pay | Admitting: Cardiovascular Disease

## 2022-05-01 VITALS — BP 126/60 | HR 79 | Ht 67.0 in | Wt 164.6 lb

## 2022-05-01 DIAGNOSIS — I739 Peripheral vascular disease, unspecified: Secondary | ICD-10-CM

## 2022-05-01 NOTE — Progress Notes (Signed)
05/01/2022 YAHSIR WICKENS   Nov 25, 1945  [id number]  Primary Physician Janith Lima, MD Primary Cardiologist: Lorretta Harp MD Lupe Carney, [place]  HPI:  Brian Hunt is a 76 y.o.   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  He was referred by Dr. Oval Linsey, his cardiologist, for symptomatic claudication.  I last saw him in the office 11/08/2021 his risk factors include 50 pack years of tobacco abuse having quit 2 months ago, treated hypertension and hyperlipidemia.  There is no family history for heart disease.  Is never had a heart attack or stroke.  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  He works out 3 times a week doing [employer].  He says that his calfs cause him discomfort when he walks and he has to stop periodically.  He had Doppler studies performed in our office 06/30/2021 revealing occluded SFAs bilaterally with a right ABI of 0.72 and a left of 0.66.  He also has bilateral carotid bruits and potentially an occluded carotid artery.  I began him on Pletal 50 mg p.o. twice daily which has afforded him significant improvement in his claudication symptoms.  He is not completely back to where he wants to be but is able to walk 2-3 times as long than previously before having to stop.  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  Since I saw him 6 months ago he continues to do well.  For the most part his claudication is significantly improved proved on Pletal 50 mg p.o. twice daily although he still has mild claudication.  He comes back today to discuss the potential for endovascular therapy.  I thoroughly explained to him the procedure, risks and benefits which she will carefully consider.     Current Meds  Medication  Sig   amLODipine (NORVASC) 5 MG tablet TAKE 1 TABLET(5 MG) BY MOUTH DAILY   aspirin EC 81 MG tablet Take 81 mg by mouth daily.   cilostazol (PLETAL) 50 MG tablet Take 1 tablet (50 mg total) by mouth 2 (two) times daily.   Multiple Vitamins-Minerals (PRESERVISION AREDS 2 PO) Take 1 tablet by mouth daily.    rosuvastatin (CRESTOR) 5 MG tablet Take 1 tablet (5 mg total) by mouth daily.   tadalafil (CIALIS) 5 MG tablet Take 1 tablet (5 mg total) by mouth daily as needed for erectile dysfunction.   telmisartan (MICARDIS) 80 MG tablet TAKE 1 TABLET(80 MG) BY MOUTH DAILY     Allergies  Allergen Reactions   Demeclocycline Nausea Only    Nausea and constipation   Pravastatin Other (See Comments)    myopathy   Lisinopril Cough   Oxycodone     Mental confusion    Social History   Socioeconomic History   Marital status: Married    Spouse name: Not on file   Number of children: 2   Years of education: Not on file   Highest education level: Not on file  Occupational History   Occupation: retired  Tobacco Use   Smoking status: Former    Packs/day: 0.00    Years: 40.00    Total pack years: 0.00    Types: Cigarettes    Quit date: 06/14/2021    Years since quitting: 0.8  Smokeless tobacco: Never  Vaping Use   Vaping Use: Never used  Substance and Sexual Activity   Alcohol use: No    Comment: 2001- states no rehab   Drug use: No   Sexual activity: Not on file  Other Topics Concern   Not on file  Social History Narrative   Not on file   Social Determinants of Health   Financial Resource Strain: Low Risk  (10/15/2020)   Overall Financial Resource Strain (CARDIA)    Difficulty of Paying Living Expenses: Not hard at all  Food Insecurity: No Food Insecurity (10/15/2020)   Hunger Vital Sign    Worried About Running Out of Food in the Last Year: Never true    Ran Out of Food in the Last Year: Never true  Transportation Needs: No Transportation Needs (10/15/2020)   PRAPARE -  Hydrologist (Medical): No    Lack of Transportation (Non-Medical): No  Physical Activity: Insufficiently Active (06/15/2021)   Exercise Vital Sign    Days of Exercise per Week: 3 days    Minutes of Exercise per Session: 40 min  Stress: No Stress Concern Present (10/15/2020)   [place]    Feeling of Stress : Not at all  Social Connections: [place] (10/15/2020)   Social Connection and Isolation Panel [NHANES]    Frequency of Communication with Friends and Family: More than three times a week    Frequency of Social Gatherings with Friends and Family: Once a week    Attends Religious Services: More than 4 times per year    Active Member of [employer] or Organizations: Yes    Attends Music therapist: More than 4 times per year    Marital Status: Married  Human resources officer Violence: Not on file     Review of Systems: General: negative for chills, fever, night sweats or weight changes.  Cardiovascular: negative for chest pain, dyspnea on exertion, edema, orthopnea, palpitations, paroxysmal nocturnal dyspnea or shortness of breath Dermatological: negative for rash Respiratory: negative for cough or wheezing Urologic: negative for hematuria Abdominal: negative for nausea, vomiting, diarrhea, bright red blood per rectum, melena, or hematemesis Neurologic: negative for visual changes, syncope, or dizziness All other systems reviewed and are otherwise negative except as noted above.    Blood pressure 126/60, pulse 79, height '5\' 7"'$  (1.702 m), weight 164 lb 9.6 oz (74.7 kg), SpO2 94 %.  General appearance: alert and no distress Neck: no adenopathy, no JVD, supple, symmetrical, trachea midline, thyroid not enlarged, symmetric, no tenderness/mass/nodules, and soft right carotid bruit Lungs: clear to auscultation bilaterally Heart: regular rate and rhythm, S1, S2 normal, no murmur,  click, rub or gallop Extremities: extremities normal, atraumatic, no cyanosis or edema Pulses: 2+ and symmetric Diminished pedal pulses Skin: Skin color, texture, turgor normal. No rashes or lesions Neurologic: Grossly normal  EKG sinus rhythm at 79 with right bundle branch block.  I personally reviewed this EKG.  ASSESSMENT AND PLAN:   PAD (peripheral artery disease) (HCC) History of peripheral arterial disease with Dopplers that suggest SFA occlusions bilaterally performed 06/30/2021 with a right ABI of 0.72 and a left of 0.66.  His iliac arteries appear patent.  I did place him on Pletal which afforded him significant clinical benefit although he says he still has some mild claudication which is lifestyle limiting.  We talked about the possibility of doing angiography and endovascular therapy which she wishes to consider.  He will get back to me with his final decision.     Lorretta Harp MD FACP,FACC,FAHA, [facility] 05/01/2022 10:07 AM

## 2022-05-01 NOTE — Patient Instructions (Signed)
Medication Instructions:  No changes *If you need a refill on your cardiac medications before your next appointment, please call your pharmacy*   Lab Work: None ordered If you have labs (blood work) drawn today and your tests are completely normal, you will receive your results only by: [place] (if you have MyChart) OR A paper copy in the mail If you have any lab test that is abnormal or we need to change your treatment, we will call you to review the results.   Testing/Procedures: None ordered   Follow-Up: At [facility], you and your health needs are our priority.  As part of our continuing mission to provide you with exceptional heart care, we have created designated Provider Care Teams.  These Care Teams include your primary Cardiologist (physician) and Advanced Practice Providers (APPs -  Physician Assistants and Nurse Practitioners) who all work together to provide you with the care you need, when you need it.  We recommend signing up for the patient portal called "MyChart".  Sign up information is provided on this After Visit Summary.  MyChart is used to connect with patients for Virtual Visits (Telemedicine).  Patients are able to view lab/test results, encounter notes, upcoming appointments, etc.  Non-urgent messages can be sent to your provider as well.   To learn more about what you can do with MyChart, go to [email or web address].    Your next appointment:   6 month(s)  The format for your next appointment:   In Person  Provider:   Dr. Gwenlyn Found

## 2022-05-01 NOTE — Assessment & Plan Note (Signed)
History of peripheral arterial disease with Dopplers that suggest SFA occlusions bilaterally performed 06/30/2021 with a right ABI of 0.72 and a left of 0.66.  His iliac arteries appear patent.  I did place him on Pletal which afforded him significant clinical benefit although he says he still has some mild claudication which is lifestyle limiting.  We talked about the possibility of doing angiography and endovascular therapy which she wishes to consider.  He will get back to me with his final decision.

## 2022-05-11 ENCOUNTER — Other Ambulatory Visit: Payer: Self-pay | Admitting: Internal Medicine

## 2022-05-11 DIAGNOSIS — I10 Essential (primary) hypertension: Secondary | ICD-10-CM

## 2022-05-17 ENCOUNTER — Ambulatory Visit: Payer: Medicare PPO | Admitting: Cardiovascular Disease

## 2022-05-25 ENCOUNTER — Encounter: Payer: Self-pay | Admitting: Internal Medicine

## 2022-05-25 ENCOUNTER — Ambulatory Visit: Payer: Medicare PPO | Admitting: Internal Medicine

## 2022-05-25 VITALS — BP 116/64 | HR 81 | Temp 97.9°F | Resp 16 | Ht 67.0 in | Wt 163.0 lb

## 2022-05-25 DIAGNOSIS — D539 Nutritional anemia, unspecified: Secondary | ICD-10-CM | POA: Diagnosis not present

## 2022-05-25 DIAGNOSIS — E222 Syndrome of inappropriate secretion of antidiuretic hormone: Secondary | ICD-10-CM

## 2022-05-25 DIAGNOSIS — I10 Essential (primary) hypertension: Secondary | ICD-10-CM

## 2022-05-25 DIAGNOSIS — E785 Hyperlipidemia, unspecified: Secondary | ICD-10-CM | POA: Diagnosis not present

## 2022-05-25 LAB — CBC WITH DIFFERENTIAL/PLATELET
Basophils Absolute: 0.1 10*3/uL (ref 0.0–0.1)
Basophils Relative: 0.9 % (ref 0.0–3.0)
Eosinophils Absolute: 0.4 10*3/uL (ref 0.0–0.7)
Eosinophils Relative: 5.7 % — ABNORMAL HIGH (ref 0.0–5.0)
HCT: 37.9 % — ABNORMAL LOW (ref 39.0–52.0)
Hemoglobin: 12.8 g/dL — ABNORMAL LOW (ref 13.0–17.0)
Lymphocytes Relative: 16.9 % (ref 12.0–46.0)
Lymphs Abs: 1.3 10*3/uL (ref 0.7–4.0)
MCHC: 33.7 g/dL (ref 30.0–36.0)
MCV: 96.6 fl (ref 78.0–100.0)
Monocytes Absolute: 1 10*3/uL (ref 0.1–1.0)
Monocytes Relative: 13.4 % — ABNORMAL HIGH (ref 3.0–12.0)
Neutro Abs: 4.7 10*3/uL (ref 1.4–7.7)
Neutrophils Relative %: 63.1 % (ref 43.0–77.0)
Platelets: 294 10*3/uL (ref 150.0–400.0)
RBC: 3.93 Mil/uL — ABNORMAL LOW (ref 4.22–5.81)
RDW: 13.7 % (ref 11.5–15.5)
WBC: 7.5 10*3/uL (ref 4.0–10.5)

## 2022-05-25 LAB — BASIC METABOLIC PANEL
BUN: 17 mg/dL (ref 6–23)
CO2: 26 mEq/L (ref 19–32)
Calcium: 9 mg/dL (ref 8.4–10.5)
Chloride: 98 mEq/L (ref 96–112)
Creatinine, Ser: 0.89 mg/dL (ref 0.40–1.50)
GFR: 83.59 mL/min (ref >60.00)
Glucose, Bld: 94 mg/dL (ref 70–99)
Potassium: 4.3 mEq/L (ref 3.5–5.1)
Sodium: 131 mEq/L — ABNORMAL LOW (ref 135–145)

## 2022-05-25 LAB — HEPATIC FUNCTION PANEL
ALT: 14 U/L (ref 0–53)
AST: 17 U/L (ref 0–37)
Albumin: 3.6 g/dL (ref 3.5–5.2)
Alkaline Phosphatase: 60 U/L (ref 39–117)
Bilirubin, Direct: 0.3 mg/dL (ref 0.0–0.3)
Total Bilirubin: 0.4 mg/dL (ref 0.2–1.2)
Total Protein: 7.2 g/dL (ref 6.0–8.3)

## 2022-05-25 LAB — LIPID PANEL
Cholesterol: 116 mg/dL (ref 0–200)
HDL: 52.4 mg/dL (ref >39.00)
LDL Cholesterol: 54 mg/dL (ref 0–99)
NonHDL: 63.9
Total CHOL/HDL Ratio: 2
Triglycerides: 52 mg/dL (ref 0.0–149.0)
VLDL: 10.4 mg/dL (ref 0.0–40.0)

## 2022-05-25 LAB — TSH: TSH: 2.55 u[IU]/mL (ref 0.35–5.50)

## 2022-05-25 NOTE — Patient Instructions (Signed)
Hypertension, Adult High blood pressure (hypertension) is when the force of blood pumping through the arteries is too strong. The arteries are the blood vessels that carry blood from the heart throughout the body. Hypertension forces the heart to work harder to pump blood and may cause arteries to become narrow or stiff. Untreated or uncontrolled hypertension can lead to a heart attack, heart failure, a stroke, kidney disease, and other problems. A blood pressure reading consists of a higher number over a lower number. Ideally, your blood pressure should be below 120/80. The first ("top") number is called the systolic pressure. It is a measure of the pressure in your arteries as your heart beats. The second ("bottom") number is called the diastolic pressure. It is a measure of the pressure in your arteries as the heart relaxes. What are the causes? The exact cause of this condition is not known. There are some conditions that result in high blood pressure. What increases the risk? Certain factors may make you more likely to develop high blood pressure. Some of these risk factors are under your control, including: Smoking. Not getting enough exercise or physical activity. Being overweight. Having too much fat, sugar, calories, or salt (sodium) in your diet. Drinking too much alcohol. Other risk factors include: Having a personal history of heart disease, diabetes, high cholesterol, or kidney disease. Stress. Having a family history of high blood pressure and high cholesterol. Having obstructive sleep apnea. Age. The risk increases with age. What are the signs or symptoms? High blood pressure may not cause symptoms. Very high blood pressure (hypertensive crisis) may cause: Headache. Fast or irregular heartbeats (palpitations). Shortness of breath. Nosebleed. Nausea and vomiting. Vision changes. Severe chest pain, dizziness, and seizures. How is this diagnosed? This condition is diagnosed by  measuring your blood pressure while you are seated, with your arm resting on a flat surface, your legs uncrossed, and your feet flat on the floor. The cuff of the blood pressure monitor will be placed directly against the skin of your upper arm at the level of your heart. Blood pressure should be measured at least twice using the same arm. Certain conditions can cause a difference in blood pressure between your right and left arms. If you have a high blood pressure reading during one visit or you have normal blood pressure with other risk factors, you may be asked to: Return on a different day to have your blood pressure checked again. Monitor your blood pressure at home for 1 week or longer. If you are diagnosed with hypertension, you may have other blood or imaging tests to help your health care provider understand your overall risk for other conditions. How is this treated? This condition is treated by making healthy lifestyle changes, such as eating healthy foods, exercising more, and reducing your alcohol intake. You may be referred for counseling on a healthy diet and physical activity. Your health care provider may prescribe medicine if lifestyle changes are not enough to get your blood pressure under control and if: Your systolic blood pressure is above 130. Your diastolic blood pressure is above 80. Your personal target blood pressure may vary depending on your medical conditions, your age, and other factors. Follow these instructions at home: Eating and drinking  Eat a diet that is high in fiber and potassium, and low in sodium, added sugar, and fat. An example of this eating plan is called the DASH diet. DASH stands for Dietary Approaches to Stop Hypertension. To eat this way: Eat   plenty of fresh fruits and vegetables. Try to fill one half of your plate at each meal with fruits and vegetables. Eat whole grains, such as whole-wheat pasta, brown rice, or whole-grain bread. Fill about one  fourth of your plate with whole grains. Eat or drink low-fat dairy products, such as skim milk or low-fat yogurt. Avoid fatty cuts of meat, processed or cured meats, and poultry with skin. Fill about one fourth of your plate with lean proteins, such as fish, chicken without skin, beans, eggs, or tofu. Avoid pre-made and processed foods. These tend to be higher in sodium, added sugar, and fat. Reduce your daily sodium intake. Many people with hypertension should eat less than 1,500 mg of sodium a day. Do not drink alcohol if: Your health care provider tells you not to drink. You are pregnant, may be pregnant, or are planning to become pregnant. If you drink alcohol: Limit how much you have to: 0-1 drink a day for women. 0-2 drinks a day for men. Know how much alcohol is in your drink. In the [place] one drink equals one 12 oz bottle of beer (355 mL), one 5 oz glass of wine (148 mL), or one 1 oz glass of hard liquor (44 mL). Lifestyle  Work with your health care provider to maintain a healthy body weight or to lose weight. Ask what an ideal weight is for you. Get at least 30 minutes of exercise that causes your heart to beat faster (aerobic exercise) most days of the week. Activities may include walking, swimming, or biking. Include exercise to strengthen your muscles (resistance exercise), such as Pilates or lifting weights, as part of your weekly exercise routine. Try to do these types of exercises for 30 minutes at least 3 days a week. Do not use any products that contain nicotine or tobacco. These products include cigarettes, chewing tobacco, and vaping devices, such as e-cigarettes. If you need help quitting, ask your health care provider. Monitor your blood pressure at home as told by your health care provider. Keep all follow-up visits. This is important. Medicines Take over-the-counter and prescription medicines only as told by your health care provider. Follow directions carefully. Blood  pressure medicines must be taken as prescribed. Do not skip doses of blood pressure medicine. Doing this puts you at risk for problems and can make the medicine less effective. Ask your health care provider about side effects or reactions to medicines that you should watch for. Contact a health care provider if you: Think you are having a reaction to a medicine you are taking. Have headaches that keep coming back (recurring). Feel dizzy. Have swelling in your ankles. Have trouble with your vision. Get help right away if you: Develop a severe headache or confusion. Have unusual weakness or numbness. Feel faint. Have severe pain in your chest or abdomen. Vomit repeatedly. Have trouble breathing. These symptoms may be an emergency. Get help right away. Call 911. Do not wait to see if the symptoms will go away. Do not drive yourself to the hospital. Summary Hypertension is when the force of blood pumping through your arteries is too strong. If this condition is not controlled, it may put you at risk for serious complications. Your personal target blood pressure may vary depending on your medical conditions, your age, and other factors. For most people, a normal blood pressure is less than 120/80. Hypertension is treated with lifestyle changes, medicines, or a combination of both. Lifestyle changes include losing weight, eating a healthy,   low-sodium diet, exercising more, and limiting alcohol. This information is not intended to replace advice given to you by your health care provider. Make sure you discuss any questions you have with your health care provider. Document Revised: 07/12/2021 Document Reviewed: 07/12/2021 Elsevier Patient Education  2023 [employer]  

## 2022-05-25 NOTE — Progress Notes (Unsigned)
Subjective:  Patient ID: Brian Hunt, male    DOB: [birth year]  Age: 76 y.o. MRN: [id number]  CC: Hypertension and Anemia   HPI Brian Hunt presents for f/up -  He complains of a 3 month history of fatigue.  Outpatient Medications Prior to Visit  Medication Sig Dispense Refill   aspirin EC 81 MG tablet Take 81 mg by mouth daily.     cilostazol (PLETAL) 50 MG tablet Take 1 tablet (50 mg total) by mouth 2 (two) times daily. 180 tablet 3   Multiple Vitamins-Minerals (PRESERVISION AREDS 2 PO) Take 1 tablet by mouth daily.      rosuvastatin (CRESTOR) 5 MG tablet Take 1 tablet (5 mg total) by mouth daily. 90 tablet 1   telmisartan (MICARDIS) 80 MG tablet TAKE 1 TABLET(80 MG) BY MOUTH DAILY 90 tablet 3   amLODipine (NORVASC) 5 MG tablet TAKE 1 TABLET(5 MG) BY MOUTH DAILY 90 tablet 0   tadalafil (CIALIS) 5 MG tablet Take 1 tablet (5 mg total) by mouth daily as needed for erectile dysfunction. 30 tablet 11   No facility-administered medications prior to visit.    ROS Review of Systems  Constitutional:  Positive for fatigue. Negative for chills, diaphoresis and unexpected weight change.  HENT: Negative.    Eyes: Negative.   Respiratory:  Negative for cough, chest tightness, shortness of breath and wheezing.   Cardiovascular: Negative.  Negative for chest pain, palpitations and leg swelling.  Gastrointestinal: Negative.  Negative for abdominal pain, constipation, diarrhea, nausea and vomiting.  Endocrine: Negative.   Genitourinary: Negative.  Negative for difficulty urinating.  Musculoskeletal: Negative.   Skin: Negative.   Neurological:  Negative for dizziness, weakness and headaches.  Hematological:  Negative for adenopathy. Does not bruise/bleed easily.  Psychiatric/Behavioral: Negative.      Objective:  BP 116/64 (BP Location: Left Arm, Patient Position: Sitting, Cuff Size: Large)   Pulse 81   Temp 97.9 F (36.6 C) (Oral)   Resp 16   Ht '5\' 7"'$  (1.702 m)   Wt 163 lb  (73.9 kg)   SpO2 95%   BMI 25.53 kg/m   BP Readings from Last 3 Encounters:  05/25/22 116/64  05/01/22 126/60  11/17/21 136/80    Wt Readings from Last 3 Encounters:  05/25/22 163 lb (73.9 kg)  05/01/22 164 lb 9.6 oz (74.7 kg)  11/17/21 166 lb (75.3 kg)    Physical Exam Vitals reviewed.  HENT:     Nose: Nose normal.     Mouth/Throat:     Mouth: Mucous membranes are moist.  Eyes:     General: No scleral icterus.    Conjunctiva/sclera: Conjunctivae normal.  Cardiovascular:     Rate and Rhythm: Normal rate and regular rhythm.     Heart sounds: Murmur heard.     Systolic murmur is present with a grade of 1/6.     No diastolic murmur is present.     No gallop.  Pulmonary:     Effort: Pulmonary effort is normal. No respiratory distress.     Breath sounds: Normal breath sounds. No stridor. No wheezing, rhonchi or rales.  Chest:     Chest wall: No tenderness.  Abdominal:     General: Abdomen is flat.     Palpations: There is no mass.     Tenderness: There is no abdominal tenderness. There is no guarding or rebound.  Musculoskeletal:        General: Normal range of motion.  Cervical back: Neck supple.     Right lower leg: No edema.     Left lower leg: No edema.  Lymphadenopathy:     Cervical: No cervical adenopathy.  Skin:    General: Skin is warm and dry.  Neurological:     General: No focal deficit present.     Mental Status: He is alert. Mental status is at baseline.  Psychiatric:        Mood and Affect: Mood normal.        Behavior: Behavior normal.     Lab Results  Component Value Date   WBC 7.5 05/25/2022   HGB 12.8 (L) 05/25/2022   HCT 37.9 (L) 05/25/2022   PLT 294.0 05/25/2022   GLUCOSE 94 05/25/2022   CHOL 116 05/25/2022   TRIG 52.0 05/25/2022   HDL 52.40 05/25/2022   LDLCALC 54 05/25/2022   ALT 14 05/25/2022   AST 17 05/25/2022   NA 131 (L) 05/25/2022   K 4.3 05/25/2022   CL 98 05/25/2022   CREATININE 0.89 05/25/2022   BUN 17 05/25/2022    CO2 26 05/25/2022   TSH 2.55 05/25/2022   PSA 3.62 10/07/2021   INR 1.10 09/24/2017   HGBA1C 5.9 (H) 07/18/2021    VAS US AORTA/IVC/ILIACS  Result Date: 08/04/2021 ABDOMINAL AORTA STUDY Patient Name:  Brian Hunt  Date of Exam:   08/03/2021 Medical Rec #: [id number]       Accession #:    [id number] Date of Birth: Oct 26, 1945       Patient Gender: M Patient Age:   56 years Exam Location:  [facility] Procedure:      VAS US AORTA/IVC/ILIACS Referring Phys: Roderic Palau BERRY --------------------------------------------------------------------------------  Risk Factors: Hypertension, hyperlipidemia, past history of smoking. Other Factors: Patient has had bilateral calf fatigue after exercising for about                15 minutes. This occurs intermittently over the last 2 years,                right > left. Patient has had both hips replaced and does have                right hip painoccassionally. He denies rest pain. Limitations: Air/bowel gas.  Performing Technologist: Wilkie Aye RVT  Examination Guidelines: A complete evaluation includes B-mode imaging, spectral Doppler, color Doppler, and power Doppler as needed of all accessible portions of each vessel. Bilateral testing is considered an integral part of a complete examination. Limited examinations for reoccurring indications may be performed as noted.  Abdominal Aorta Findings: +-------------+-------+----------+----------+--------+--------+--------+ Location     AP (cm)Trans (cm)PSV (cm/s)WaveformThrombusComments +-------------+-------+----------+----------+--------+--------+--------+ Proximal                      63                                 +-------------+-------+----------+----------+--------+--------+--------+ Mid                           89                                 +-------------+-------+----------+----------+--------+--------+--------+ Distal       1.40   1.80      103                                 +-------------+-------+----------+----------+--------+--------+--------+  RT CIA Prox                   108       biphasic                 +-------------+-------+----------+----------+--------+--------+--------+ RT CIA Mid                    116       biphasic                 +-------------+-------+----------+----------+--------+--------+--------+ RT CIA Distal                 147       biphasic                 +-------------+-------+----------+----------+--------+--------+--------+ RT EIA Mid                    91        biphasic                 +-------------+-------+----------+----------+--------+--------+--------+ RT EIA Distal                 157       biphasic                 +-------------+-------+----------+----------+--------+--------+--------+ LT CIA Prox                   109       biphasic                 +-------------+-------+----------+----------+--------+--------+--------+ LT CIA Mid                    86        biphasic                 +-------------+-------+----------+----------+--------+--------+--------+ LT CIA Distal                 121       biphasic                 +-------------+-------+----------+----------+--------+--------+--------+ LT EIA Mid                    134       biphasic                 +-------------+-------+----------+----------+--------+--------+--------+ LT EIA Distal                 118       biphasic                 +-------------+-------+----------+----------+--------+--------+--------+ Visualization of the Right EIA Proximal artery, Left CIA Mid artery, Left CIA Proximal artery, Right EIA Mid artery and Proximal Abdominal Aorta was limited.  Summary: Abdominal Aorta: The largest aortic measurement is 1.8 cm. 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 Technically challenging due to flash artifact and bowel gas. IVC/Iliac: There is no evidence of thrombus  involving the IVC.  *See table(s) above for measurements and observations.  Electronically signed by Carlyle Dolly MD on 08/04/2021 at 2:03:56 PM.    Final     Assessment & Plan:   Jayce was seen today for hypertension and anemia.  Diagnoses and all orders for this visit:  Primary hypertension- His BP is well controlled. -     Basic metabolic panel; Future -     CBC with Differential/Platelet;  Future -     TSH; Future -     TSH -     CBC with Differential/Platelet -     Basic metabolic panel  SIADH (syndrome of inappropriate ADH production) ([place])- Stable. -     Basic metabolic panel; Future -     Basic metabolic panel  Hyperlipidemia with target LDL less than 70 - LDL goal achieved. 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 -     Reticulocytes; Future -     IBC + Ferritin; Future -     Vitamin B12; Future -     Folate; Future -     Zinc; Future -     Vitamin B1; Future   I have discontinued [facility] F. Mas's tadalafil and amLODipine. I am also having him maintain his Multiple Vitamins-Minerals (PRESERVISION AREDS 2 PO), aspirin EC, cilostazol, rosuvastatin, and telmisartan.  No orders of the defined types were placed in this encounter.    Follow-up: Return in about 3 months (around 08/24/2022).  Scarlette Calico, MD

## 2022-05-26 ENCOUNTER — Other Ambulatory Visit (INDEPENDENT_AMBULATORY_CARE_PROVIDER_SITE_OTHER): Payer: Medicare PPO

## 2022-05-26 DIAGNOSIS — D539 Nutritional anemia, unspecified: Secondary | ICD-10-CM

## 2022-05-26 LAB — IBC + FERRITIN
Ferritin: 54.2 ng/mL (ref 22.0–322.0)
Iron: 68 ug/dL (ref 42–165)
Saturation Ratios: 21.4 % (ref 20.0–50.0)
TIBC: 317.8 ug/dL (ref 250.0–450.0)
Transferrin: 227 mg/dL (ref 212.0–360.0)

## 2022-05-26 LAB — VITAMIN B12: Vitamin B-12: 446 pg/mL (ref 211–911)

## 2022-05-26 LAB — FOLATE: Folate: 22.2 ng/mL (ref >5.9)

## 2022-05-29 ENCOUNTER — Other Ambulatory Visit: Payer: Self-pay | Admitting: Internal Medicine

## 2022-05-29 DIAGNOSIS — I739 Peripheral vascular disease, unspecified: Secondary | ICD-10-CM

## 2022-05-29 DIAGNOSIS — I6523 Occlusion and stenosis of bilateral carotid arteries: Secondary | ICD-10-CM

## 2022-05-29 DIAGNOSIS — E785 Hyperlipidemia, unspecified: Secondary | ICD-10-CM

## 2022-05-30 ENCOUNTER — Other Ambulatory Visit: Payer: Self-pay | Admitting: Internal Medicine

## 2022-05-30 DIAGNOSIS — E60 Dietary zinc deficiency: Secondary | ICD-10-CM | POA: Insufficient documentation

## 2022-05-30 DIAGNOSIS — D538 Other specified nutritional anemias: Secondary | ICD-10-CM | POA: Insufficient documentation

## 2022-05-30 LAB — ZINC: Zinc: 57 ug/dL — ABNORMAL LOW (ref 60–130)

## 2022-05-30 LAB — VITAMIN B1: Vitamin B1 (Thiamine): 31 nmol/L — ABNORMAL HIGH (ref 8–30)

## 2022-05-30 LAB — RETICULOCYTES
ABS Retic: 48840 cells/uL (ref 25000–90000)
Retic Ct Pct: 1.2 %

## 2022-05-30 MED ORDER — ZINC GLUCONATE 50 MG PO TABS: 50.0000 mg | ORAL_TABLET | Freq: Every day | ORAL | 1 refills | Status: DC

## 2022-06-28 DIAGNOSIS — H6123 Impacted cerumen, bilateral: Secondary | ICD-10-CM | POA: Diagnosis not present

## 2022-07-05 DIAGNOSIS — F17210 Nicotine dependence, cigarettes, uncomplicated: Secondary | ICD-10-CM | POA: Diagnosis not present

## 2022-07-05 DIAGNOSIS — M19011 Primary osteoarthritis, right shoulder: Secondary | ICD-10-CM | POA: Diagnosis not present

## 2022-07-05 DIAGNOSIS — Z716 Tobacco abuse counseling: Secondary | ICD-10-CM | POA: Diagnosis not present

## 2022-07-06 ENCOUNTER — Other Ambulatory Visit: Payer: Self-pay | Admitting: Orthopedic Surgery

## 2022-07-06 DIAGNOSIS — M19011 Primary osteoarthritis, right shoulder: Secondary | ICD-10-CM

## 2022-07-11 ENCOUNTER — Ambulatory Visit
Admission: RE | Admit: 2022-07-11 | Discharge: 2022-07-11 | Disposition: A | Discharge status: Home / Self Care | Payer: Medicare PPO | Source: Ambulatory Visit | Attending: Orthopedic Surgery | Admitting: Orthopedic Surgery

## 2022-07-11 DIAGNOSIS — M25411 Effusion, right shoulder: Secondary | ICD-10-CM | POA: Diagnosis not present

## 2022-07-11 DIAGNOSIS — M19011 Primary osteoarthritis, right shoulder: Secondary | ICD-10-CM

## 2022-07-11 DIAGNOSIS — Z01818 Encounter for other preprocedural examination: Secondary | ICD-10-CM | POA: Diagnosis not present

## 2022-07-12 ENCOUNTER — Ambulatory Visit (INDEPENDENT_AMBULATORY_CARE_PROVIDER_SITE_OTHER): Payer: Medicare PPO

## 2022-07-12 VITALS — Ht 67.0 in | Wt 163.0 lb

## 2022-07-12 DIAGNOSIS — Z0000 Encounter for general adult medical examination without abnormal findings: Secondary | ICD-10-CM

## 2022-07-12 NOTE — Patient Instructions (Signed)
[name] , Thank you for taking time to come for your Medicare Wellness Visit. I appreciate your ongoing commitment to your health goals. Please review the following plan we discussed and let me know if I can assist you in the future.   These are the goals we discussed:  Goals      Patient Stated     My goal is to have shoulder replacement surgery, continue to exercise, and pay more attention to my diet.        This is a list of the screening recommended for you and due dates:  Health Maintenance  Topic Date Due   Flu Shot  12/17/2022*   Medicare Annual Wellness Visit  08/12/2023   Tetanus Vaccine  05/10/2025   Colon Cancer Screening  05/12/2025   Pneumonia Vaccine  Completed   Hepatitis C Screening: USPSTF Recommendation to screen - Ages 18-79 yo.  Completed   Zoster (Shingles) Vaccine  Completed   HPV Vaccine  Aged Out   COVID-19 Vaccine  Discontinued  *Topic was postponed. The date shown is not the original due date.    Advanced directives: Yes; Please bring a copy of your health care power of attorney and living will to the office at your convenience.  Conditions/risks identified: Yes  Next appointment: Follow up in one year for your annual wellness visit.   Preventive Care 76 Years and Older, Male  Preventive care refers to lifestyle choices and visits with your health care provider that can promote health and wellness. What does preventive care include? A yearly physical exam. This is also called an annual well check. Dental exams once or twice a year. Routine eye exams. Ask your health care provider how often you should have your eyes checked. Personal lifestyle choices, including: Daily care of your teeth and gums. Regular physical activity. Eating a healthy diet. Avoiding tobacco and drug use. Limiting alcohol use. Practicing safe sex. Taking low doses of aspirin every day. Taking vitamin and mineral supplements as recommended by your health care  provider. What happens during an annual well check? The services and screenings done by your health care provider during your annual well check will depend on your age, overall health, lifestyle risk factors, and family history of disease. Counseling  Your health care provider may ask you questions about your: Alcohol use. Tobacco use. Drug use. Emotional well-being. Home and relationship well-being. Sexual activity. Eating habits. History of falls. Memory and ability to understand (cognition). Work and work Statistician. Screening  You may have the following tests or measurements: Height, weight, and BMI. Blood pressure. Lipid and cholesterol levels. These may be checked every 5 years, or more frequently if you are over 60 years old. Skin check. Lung cancer screening. You may have this screening every year starting at age 76 if you have a 30-pack-year history of smoking and currently smoke or have quit within the past 15 years. Fecal occult blood test (FOBT) of the stool. You may have this test every year starting at age 76. Flexible sigmoidoscopy or colonoscopy. You may have a sigmoidoscopy every 5 years or a colonoscopy every 10 years starting at age 76. Prostate cancer screening. Recommendations will vary depending on your family history and other risks. Hepatitis C blood test. Hepatitis B blood test. Sexually transmitted disease (STD) testing. Diabetes screening. This is done by checking your blood sugar (glucose) after you have not eaten for a while (fasting). You may have this done every 1-3 years. Abdominal aortic aneurysm (  AAA) screening. You may need this if you are a current or former smoker. Osteoporosis. You may be screened starting at age 76 if you are at high risk. Talk with your health care provider about your test results, treatment options, and if necessary, the need for more tests. Vaccines  Your health care provider may recommend certain vaccines, such  as: Influenza vaccine. This is recommended every year. Tetanus, diphtheria, and acellular pertussis (Tdap, Td) vaccine. You may need a Td booster every 10 years. Zoster vaccine. You may need this after age 5. Pneumococcal 13-valent conjugate (PCV13) vaccine. One dose is recommended after age 4. Pneumococcal polysaccharide (PPSV23) vaccine. One dose is recommended after age 19. Talk to your health care provider about which screenings and vaccines you need and how often you need them. This information is not intended to replace advice given to you by your health care provider. Make sure you discuss any questions you have with your health care provider. Document Released: 10/01/2015 Document Revised: 05/24/2016 Document Reviewed: 07/06/2015 Elsevier Interactive Patient Education  2017 [place] Prevention in the Home Falls can cause injuries. They can happen to people of all ages. There are many things you can do to make your home safe and to help prevent falls. What can I do on the outside of my home? Regularly fix the edges of walkways and driveways and fix any cracks. Remove anything that might make you trip as you walk through a door, such as a raised step or threshold. Trim any bushes or trees on the path to your home. Use bright outdoor lighting. Clear any walking paths of anything that might make someone trip, such as rocks or tools. Regularly check to see if handrails are loose or broken. Make sure that both sides of any steps have handrails. Any raised decks and porches should have guardrails on the edges. Have any leaves, snow, or ice cleared regularly. Use sand or salt on walking paths during winter. Clean up any spills in your garage right away. This includes oil or grease spills. What can I do in the bathroom? Use night lights. Install grab bars by the toilet and in the tub and shower. Do not use towel bars as grab bars. Use non-skid mats or decals in the tub or  shower. If you need to sit down in the shower, use a plastic, non-slip stool. Keep the floor dry. Clean up any water that spills on the floor as soon as it happens. Remove soap buildup in the tub or shower regularly. Attach bath mats securely with double-sided non-slip rug tape. Do not have throw rugs and other things on the floor that can make you trip. What can I do in the bedroom? Use night lights. Make sure that you have a light by your bed that is easy to reach. Do not use any sheets or blankets that are too big for your bed. They should not hang down onto the floor. Have a firm chair that has side arms. You can use this for support while you get dressed. Do not have throw rugs and other things on the floor that can make you trip. What can I do in the kitchen? Clean up any spills right away. Avoid walking on wet floors. Keep items that you use a lot in easy-to-reach places. If you need to reach something above you, use a strong step stool that has a grab bar. Keep electrical cords out of the way. Do not use floor polish  or wax that makes floors slippery. If you must use wax, use non-skid floor wax. Do not have throw rugs and other things on the floor that can make you trip. What can I do with my stairs? Do not leave any items on the stairs. Make sure that there are handrails on both sides of the stairs and use them. Fix handrails that are broken or loose. Make sure that handrails are as [place] as the stairways. Check any carpeting to make sure that it is firmly attached to the stairs. Fix any carpet that is loose or worn. Avoid having throw rugs at the top or bottom of the stairs. If you do have throw rugs, attach them to the floor with carpet tape. Make sure that you have a light switch at the top of the stairs and the bottom of the stairs. If you do not have them, ask someone to add them for you. What else can I do to help prevent falls? Wear shoes that: Do not have high heels. Have  rubber bottoms. Are comfortable and fit you well. Are closed at the toe. Do not wear sandals. If you use a stepladder: Make sure that it is fully opened. Do not climb a closed stepladder. Make sure that both sides of the stepladder are locked into place. Ask someone to hold it for you, if possible. Clearly mark and make sure that you can see: Any grab bars or handrails. First and last steps. Where the edge of each step is. Use tools that help you move around (mobility aids) if they are needed. These include: Canes. Walkers. Scooters. Crutches. Turn on the lights when you go into a dark area. Replace any light bulbs as soon as they burn out. Set up your furniture so you have a clear path. Avoid moving your furniture around. If any of your floors are uneven, fix them. If there are any pets around you, be aware of where they are. Review your medicines with your doctor. Some medicines can make you feel dizzy. This can increase your chance of falling. Ask your doctor what other things that you can do to help prevent falls. This information is not intended to replace advice given to you by your health care provider. Make sure you discuss any questions you have with your health care provider. Document Released: 07/01/2009 Document Revised: 02/10/2016 Document Reviewed: 10/09/2014 Elsevier Interactive Patient Education  2017 [employer].

## 2022-07-12 NOTE — Progress Notes (Signed)
Virtual Visit via Telephone Note  I connected with  Brian Hunt on 07/12/22 at  3:45 PM EDT by telephone and verified that I am speaking with the correct person using two identifiers.  Location: Patient: Home Provider: [place] Persons participating in the virtual visit: [place]   I discussed the limitations, risks, security and privacy concerns of performing an evaluation and management service by telephone and the availability of in person appointments. The patient expressed understanding and agreed to proceed.  Interactive audio and video telecommunications were attempted between this nurse and patient, however failed, due to patient having technical difficulties OR patient did not have access to video capability.  We continued and completed visit with audio only.  Some vital signs may be absent or patient reported.   Sheral Flow, LPN  Subjective:   Brian Hunt is a 76 y.o. male who presents for Medicare Annual/Subsequent preventive examination.  Review of Systems     Cardiac Risk Factors include: advanced age (>51mn, >>9women);dyslipidemia;family history of premature cardiovascular disease;hypertension;male gender     Objective:    Today's Vitals   07/12/22 1601  Weight: 163 lb (73.9 kg)  Height: '5\' 7"'$  (1.702 m)  PainSc: 0-No pain   Body mass index is 25.53 kg/m.     07/12/2022    3:49 PM 10/15/2020   10:08 AM 06/20/2018   10:15 AM 12/13/2017   10:52 AM 10/09/2017    1:56 AM 09/26/2017    5:50 PM 09/24/2017   10:24 AM  Advanced Directives  Does Patient Have a Medical Advance Directive? Yes Yes Yes Yes Yes Yes Yes  Type of AParamedicof AFive PointsLiving will HCottage LakeLiving will HBlevinsLiving will HF[place]Living will HNahuntaLiving will HLimestone CreekLiving will HNorth ForkLiving will  Does  patient want to make changes to medical advance directive?  No - Patient declined    No - Patient declined   Copy of HM[place]in Chart? No - copy requested No - copy requested    No - copy requested No - copy requested    Current Medications (verified) Outpatient Encounter Medications as of 07/12/2022  Medication Sig   aspirin EC 81 MG tablet Take 81 mg by mouth daily.   cilostazol (PLETAL) 50 MG tablet Take 1 tablet (50 mg total) by mouth 2 (two) times daily.   Multiple Vitamins-Minerals (PRESERVISION AREDS 2 PO) Take 1 tablet by mouth daily.    rosuvastatin (CRESTOR) 5 MG tablet TAKE 1 TABLET EVERY DAY   telmisartan (MICARDIS) 80 MG tablet TAKE 1 TABLET(80 MG) BY MOUTH DAILY   zinc gluconate 50 MG tablet Take 1 tablet (50 mg total) by mouth daily.   No facility-administered encounter medications on file as of 07/12/2022.    Allergies (verified) Demeclocycline, Pravastatin, Lisinopril, and Oxycodone   History: Past Medical History:  Diagnosis Date   Anemia    Anxiety    Arthritis    Basal cell carcinoma    Basal Cell X2 , Dr TRadford Pax  BPH (benign prostatic hyperplasia)    Bursitis of left hip    Dr. OIhor Gully  Carotid artery occlusion    Carotid stenosis 97/65/4650  Complication of anesthesia    pt. states he had vasal vagal reaction after last surgery,after he got to his room   COPD (chronic obstructive pulmonary disease) ([place]    Diverticulosis  Dyspnea    with exertion   Elevated PSA    Heart murmur    Diagnosed 03/2018.   Hyperlipidemia    Hypertension    Internal hemorrhoids    Leg pain 06/15/2021   PAD (peripheral artery disease) ([place]) 10/17/2021   Prostatitis    Dr Terance Hart   RBBB    Past Surgical History:  Procedure Laterality Date   COLONOSCOPY  2010   negative; [facility]   CYSTOSCOPY  10/2014   [facility]; neg   dislocation of right hip  2019   GREEN LIGHT LASER TURP (TRANSURETHRAL RESECTION OF PROSTATE  09/27/2012    Procedure: GREEN LIGHT LASER TURP (TRANSURETHRAL RESECTION OF PROSTATE;  Surgeon: Fredricka Bonine, MD;  Location: [facility];  Service: Urology;  Laterality: N/A;      JOINT REPLACEMENT Left Oct. 12, 2012   left total hip replacement by Dr. Mayer Camel   PROSTATE BIOPSY  2007 , 2009   X2 , Dr Thomasene Mohair  & Dr Terance Hart   PROSTATE SURGERY     Laser   TOTAL HIP ARTHROPLASTY Right 09/26/2017   TOTAL HIP ARTHROPLASTY Right 09/26/2017   Procedure: TOTAL HIP ARTHROPLASTY ANTERIOR APPROACH;  Surgeon: Frederik Pear, MD;  Location: [facility];  Service: Orthopedics;  Laterality: Right;   Family History  Problem Relation Age of Onset   Arthritis Mother    Vascular Disease Mother        Varicose Veins   Hypertension Mother    Hyperlipidemia Mother    Transient ischemic attack Mother    Heart disease Mother        After age 70   Stroke Mother    COPD Father    Bone cancer Maternal Grandmother    Heart attack Paternal Grandmother 49   Colon cancer Son 52   Turner syndrome Daughter    Asthma Neg Hx    Diabetes Neg Hx    Pancreatic cancer Neg Hx    Esophageal cancer Neg Hx    Stomach cancer Neg Hx    Liver disease Neg Hx    Rectal cancer Neg Hx    Social History   Socioeconomic History   Marital status: Married    Spouse name: Not on file   Number of children: 2   Years of education: Not on file   Highest education level: Not on file  Occupational History   Occupation: retired  Tobacco Use   Smoking status: Former    Packs/day: 0.00    Years: 40.00    Total pack years: 0.00    Types: Cigarettes    Quit date: 06/14/2021    Years since quitting: 1.0   Smokeless tobacco: Never  Vaping Use   Vaping Use: Never used  Substance and Sexual Activity   Alcohol use: No    Comment: 2001- states no rehab   Drug use: No   Sexual activity: Not on file  Other Topics Concern   Not on file  Social History Narrative   Not on file   Social Determinants of Health   Financial Resource Strain: Low  Risk  (07/12/2022)   Overall Financial Resource Strain (CARDIA)    Difficulty of Paying Living Expenses: Not hard at all  Food Insecurity: No Food Insecurity (07/12/2022)   Hunger Vital Sign    Worried About Running Out of Food in the Last Year: Never true    Ran Out of Food in the Last Year: Never true  Transportation Needs: No Transportation Needs (07/12/2022)  PRAPARE - Hydrologist (Medical): No    Lack of Transportation (Non-Medical): No  Physical Activity: Sufficiently Active (07/12/2022)   Exercise Vital Sign    Days of Exercise per Week: 3 days    Minutes of Exercise per Session: 60 min  Stress: No Stress Concern Present (07/12/2022)   [place]    Feeling of Stress : Not at all  Social Connections: [place] (07/12/2022)   Social Connection and Isolation Panel [NHANES]    Frequency of Communication with Friends and Family: More than three times a week    Frequency of Social Gatherings with Friends and Family: Once a week    Attends Religious Services: More than 4 times per year    Active Member of [employer] or Organizations: Yes    Attends Music therapist: More than 4 times per year    Marital Status: Married    Tobacco Counseling Counseling given: Not Answered   Clinical Intake:  Pre-visit preparation completed: Yes  Pain : No/denies pain Pain Score: 0-No pain     BMI - recorded: 25.53 (05/25/2022) Nutritional Status: BMI 25 -29 Overweight Nutritional Risks: None Diabetes: No  How often do you need to have someone help you when you read instructions, pamphlets, or other written materials from your doctor or pharmacy?: 1 - Never What is the last grade level you completed in school?: [employer]; Master's Degree  Diabetic? No  Interpreter Needed?: No  Information entered by :: Lisette Abu, LPN.   Activities of Daily Living    07/12/2022     4:06 PM  In your present state of health, do you have any difficulty performing the following activities:  Hearing? 0  Vision? 0  Difficulty concentrating or making decisions? 0  Walking or climbing stairs? 0  Dressing or bathing? 0  Doing errands, shopping? 0  Preparing Food and eating ? N  Using the Toilet? N  In the past six months, have you accidently leaked urine? N  Do you have problems with loss of bowel control? N  Managing your Medications? N  Managing your Finances? N  Housekeeping or managing your Housekeeping? N    Patient Care Team: Janith Lima, MD as PCP - General (Internal Medicine) Frederik Pear, MD (Orthopedic Surgery) Festus Aloe, MD as Consulting Physician (Urology) Dermatology, [facility] as Consulting Physician Hortencia Pilar, MD as Consulting Physician (Ophthalmology)  Indicate any recent Medical Services you may have received from other than [facility] providers in the past year (date may be approximate).     Assessment:   This is a routine wellness examination for [place].  Hearing/Vision screen Hearing Screening - Comments:: Denies hearing difficulties   Vision Screening - Comments:: Wears rx glasses - up to date with routine eye exams with [facility].   Dietary issues and exercise activities discussed: Current Exercise Habits: Home exercise routine;Structured exercise class, Type of exercise: walking;treadmill;stretching;strength training/weights;exercise ball;calisthenics, Time (Minutes): 60, Frequency (Times/Week): 3, Weekly Exercise (Minutes/Week): 180, Intensity: Moderate   Goals Addressed             This Visit's Progress    Patient Stated       My goal is to have shoulder replacement surgery, continue to exercise, and pay more attention to my diet.      Depression Screen    07/12/2022    3:53 PM 11/17/2021    1:06 PM 10/15/2020   10:06 AM 08/21/2019  8:58 AM 08/20/2018    7:52 AM 08/19/2018    2:22 PM 07/17/2017    11:46 AM  PHQ 2/9 Scores  PHQ - 2 Score 0 2 0 0 0 1 0    Fall Risk    07/12/2022    3:50 PM 11/17/2021    1:06 PM 10/15/2020   10:08 AM 08/21/2019    8:58 AM 08/20/2018    7:53 AM  Fall Risk   Falls in the past year? 0 0 0 0 0  Number falls in past yr: 0  0 0   Injury with Fall? 0  0 0   Risk for fall due to : No Fall Risks  No Fall Risks    Follow up Falls prevention discussed  Falls evaluation completed Falls evaluation completed     [place]:  Any stairs in or around the home? Yes  If so, are there any without handrails? No  Home free of loose throw rugs in walkways, pet beds, electrical cords, etc? Yes  Adequate lighting in your home to reduce risk of falls? Yes   ASSISTIVE DEVICES UTILIZED TO PREVENT FALLS:  Life alert? No  Use of a cane, walker or w/c? No  Grab bars in the bathroom? No  Shower chair or bench in shower? No  Elevated toilet seat or a handicapped toilet? Yes   TIMED UP AND GO:  Was the test performed? No . Phone Visit   Cognitive Function:        07/12/2022    4:06 PM  6CIT Screen  What Year? 0 points  What month? 0 points  What time? 0 points  Count back from 20 0 points  Months in reverse 0 points  Repeat phrase 0 points  Total Score 0 points    Immunizations Immunization History  Administered Date(s) Administered   Influenza Whole 09/19/1999, 07/24/2013   Influenza, High Dose Seasonal PF 06/14/2016, 07/16/2017, 08/19/2018   Influenza-Unspecified 06/13/2019, 06/21/2020, 07/18/2021   PFIZER(Purple Top)SARS-COV-2 Vaccination 10/08/2019, 10/29/2019, 06/21/2020   Pneumococcal Conjugate-13 02/26/2014   Pneumococcal Polysaccharide-23 06/19/2011, 09/18/2014, 11/17/2021   Tdap 05/11/2015   Zoster Recombinat (Shingrix) 11/30/2021, 03/30/2022    TDAP status: Up to date  Flu Vaccine status: Due, Education has been provided regarding the importance of this vaccine. Advised may receive this vaccine at  local pharmacy or Health Dept. Aware to provide a copy of the vaccination record if obtained from local pharmacy or Health Dept. Verbalized acceptance and understanding.  Pneumococcal vaccine status: Up to date  Covid-19 vaccine status: Completed vaccines  Qualifies for Shingles Vaccine? Yes   Zostavax completed No   Shingrix Completed?: Yes  Screening Tests Health Maintenance  Topic Date Due   INFLUENZA VACCINE  12/17/2022 (Originally 04/18/2022)   Medicare Annual Wellness (AWV)  08/12/2023   TETANUS/TDAP  05/10/2025   COLONOSCOPY (Pts 45-35yr Insurance coverage will need to be confirmed)  05/12/2025   Pneumonia Vaccine 76 Years old  Completed   Hepatitis C Screening  Completed   Zoster Vaccines- Shingrix  Completed   HPV VACCINES  Aged Out   COVID-19 Vaccine  Discontinued    Health Maintenance  There are no preventive care reminders to display for this patient.   Colorectal cancer screening: Type of screening: Colonoscopy. Completed 05/12/2020. Repeat every 5 years  Lung Cancer Screening: (Low Dose CT Chest recommended if Age 76-80years, 30 pack-year currently smoking OR have quit w/in 15years.) does not qualify.   Lung Cancer  Screening Referral: no  Additional Screening:  Hepatitis C Screening: does qualify; Completed 06/14/2016  Vision Screening: Recommended annual ophthalmology exams for early detection of glaucoma and other disorders of the eye. Is the patient up to date with their annual eye exam?  Yes  Who is the provider or what is the name of the office in which the patient attends annual eye exams? [facility] If pt is not established with a provider, would they like to be referred to a provider to establish care? No .   Dental Screening: Recommended annual dental exams for proper oral hygiene  Community Resource Referral / Chronic Care Management: CRR required this visit?  No   CCM required this visit?  No      Plan:     I have personally  reviewed and noted the following in the patient's chart:   Medical and social history Use of alcohol, tobacco or illicit drugs  Current medications and supplements including opioid prescriptions. Patient is not currently taking opioid prescriptions.. Functional ability and status Nutritional status Physical activity Advanced directives List of other physicians Hospitalizations, surgeries, and ER visits in previous 12 months Vitals Screenings to include cognitive, depression, and falls Referrals and appointments  In addition, I have reviewed and discussed with patient certain preventive protocols, quality metrics, and best practice recommendations. A written personalized care plan for preventive services as well as general preventive health recommendations were provided to patient.     Sheral Flow, LPN   58/83/2549   Nurse Notes: N/A

## 2022-07-13 DIAGNOSIS — M25511 Pain in right shoulder: Secondary | ICD-10-CM | POA: Diagnosis not present

## 2022-07-14 ENCOUNTER — Telehealth: Payer: Self-pay | Admitting: *Deleted

## 2022-07-14 ENCOUNTER — Telehealth: Payer: Self-pay | Admitting: Cardiovascular Disease

## 2022-07-14 NOTE — Telephone Encounter (Signed)
Pt agreeable to plan of care for tele pre op appt 07/19/22 @ 2:40. Med rec and consent are done.     Patient Consent for Virtual Visit        Brian Hunt has provided verbal consent on 07/14/2022 for a virtual visit (video or telephone).   CONSENT FOR VIRTUAL VISIT FOR:  Brian Hunt  By participating in this virtual visit I agree to the following:  I hereby voluntarily request, consent and authorize [facility] and its employed or contracted physicians, physician assistants, nurse practitioners or other licensed health care professionals (the Practitioner), to provide me with telemedicine health care services (the "Services") as deemed necessary by the treating Practitioner. I acknowledge and consent to receive the Services by the Practitioner via telemedicine. I understand that the telemedicine visit will involve communicating with the Practitioner through live audiovisual communication technology and the disclosure of certain medical information by electronic transmission. I acknowledge that I have been given the opportunity to request an in-person assessment or other available alternative prior to the telemedicine visit and am voluntarily participating in the telemedicine visit.  I understand that I have the right to withhold or withdraw my consent to the use of telemedicine in the course of my care at any time, without affecting my right to future care or treatment, and that the Practitioner or I may terminate the telemedicine visit at any time. I understand that I have the right to inspect all information obtained and/or recorded in the course of the telemedicine visit and may receive copies of available information for a reasonable fee.  I understand that some of the potential risks of receiving the Services via telemedicine include:  Delay or interruption in medical evaluation due to technological equipment failure or disruption; Information transmitted may not be sufficient  (e.g. poor resolution of images) to allow for appropriate medical decision making by the Practitioner; and/or  In rare instances, security protocols could fail, causing a breach of personal health information.  Furthermore, I acknowledge that it is my responsibility to provide information about my medical history, conditions and care that is complete and accurate to the best of my ability. I acknowledge that Practitioner's advice, recommendations, and/or decision may be based on factors not within their control, such as incomplete or inaccurate data provided by me or distortions of diagnostic images or specimens that may result from electronic transmissions. I understand that the practice of medicine is not an exact science and that Practitioner makes no warranties or guarantees regarding treatment outcomes. I acknowledge that a copy of this consent can be made available to me via my patient portal ([facility]), or I can request a printed copy by calling the office of [facility].    I understand that my insurance will be billed for this visit.   I have read or had this consent read to me. I understand the contents of this consent, which adequately explains the benefits and risks of the Services being provided via telemedicine.  I have been provided ample opportunity to ask questions regarding this consent and the Services and have had my questions answered to my satisfaction. I give my informed consent for the services to be provided through the use of telemedicine in my medical care

## 2022-07-14 NOTE — Telephone Encounter (Signed)
Pt agreeable to plan of care for tele pre op appt 07/19/22 @ 2:40. Med rec and consent are done.

## 2022-07-14 NOTE — Telephone Encounter (Signed)
Per Dr. Gwenlyn Found, permissible to hold Pletal prior to procedure. These instructions will be relayed to patient at time of 07/19/22 phone preop appointment.   Will remove from preop pool.  Loel Dubonnet, NP

## 2022-07-14 NOTE — Telephone Encounter (Signed)
     Pre-operative Risk Assessment    Patient Name: Brian Hunt  DOB: [birth year] MRN: [id number]      Request for Surgical Clearance    Procedure:   right shoulder replacement   Date of Surgery:  Clearance TBD                                 Surgeon:  Dr. Georgeanna Harrison Surgeon's Group or Practice Name:  [facility]  Phone number:  [phone] Fax number: [phone]   Type of Clearance Requested:   - Medical  - Pharmacy:  Hold defer to [facility]      Type of Anesthesia:   CHOICE with Block   Additional requests/questions:    Signed, Selinda Orion   07/14/2022, 10:59 AM

## 2022-07-14 NOTE — Telephone Encounter (Signed)
   Name: MELANIE PELLOT  DOB: 1946-01-31  MRN: [id number]  Primary Cardiologist: None  Chart reviewed as part of pre-operative protocol coverage. Because of JAKEB LAMPING past medical history and time since last visit, he will require a follow-up phone visit in order to better assess preoperative cardiovascular risk.  Hx of PAD, aortic atherosclerosis and advanced coronary calcification per CT 04/12/21.  Will route to Dr. Gwenlyn Found for input on Pletal prior to procedure.   Given coronary calcification, ideally would continue aspirin throughout the procedural period.   Loel Dubonnet, NP  07/14/2022, 12:33 PM

## 2022-07-19 ENCOUNTER — Other Ambulatory Visit: Payer: Self-pay | Admitting: Cardiovascular Disease

## 2022-07-19 ENCOUNTER — Ambulatory Visit: Payer: Medicare PPO | Attending: Cardiology | Admitting: Physician Assistant

## 2022-07-19 DIAGNOSIS — Z01810 Encounter for preprocedural cardiovascular examination: Secondary | ICD-10-CM

## 2022-07-19 NOTE — Progress Notes (Signed)
Virtual Visit via Telephone Note   Because of Brian Hunt's co-morbid illnesses, he is at least at moderate risk for complications without adequate follow up.  This format is felt to be most appropriate for this patient at this time.  The patient did not have access to video technology/had technical difficulties with video requiring transitioning to audio format only (telephone).  All issues noted in this document were discussed and addressed.  No physical exam could be performed with this format.  Please refer to the patient's chart for his consent to telehealth for [facility].  Evaluation Performed:  Preoperative cardiovascular risk assessment _____________   Date:  07/19/2022   Patient ID:  Brian Hunt, DOB July 04, 1946, MRN [id number] Patient Location:  Home Provider location:   Office  Primary Care Provider:  Janith Lima, MD Primary Cardiologist:  None  Chief Complaint / Patient Profile   76 y.o. y/o male with a h/o carotid artery stenosis, PAD, COPD, hyperlipidemia, hypertension, dyspnea with exertion, and RBBB who is pending right shoulder replacement and presents today for telephonic preoperative cardiovascular risk assessment.  Past Medical History    Past Medical History:  Diagnosis Date   Anemia    Anxiety    Arthritis    Basal cell carcinoma    Basal Cell X2 , Dr Radford Pax   BPH (benign prostatic hyperplasia)    Bursitis of left hip    Dr. Ihor Gully   Carotid artery occlusion    Carotid stenosis 10/02/7260   Complication of anesthesia    pt. states he had vasal vagal reaction after last surgery,after he got to his room   COPD (chronic obstructive pulmonary disease) ([place])    Diverticulosis    Dyspnea    with exertion   Elevated PSA    Heart murmur    Diagnosed 03/2018.   Hyperlipidemia    Hypertension    Internal hemorrhoids    Leg pain 06/15/2021   PAD (peripheral artery disease) ([place]) 10/17/2021   Prostatitis    Dr Terance Hart   RBBB    Past  Surgical History:  Procedure Laterality Date   COLONOSCOPY  2010   negative; [facility]   CYSTOSCOPY  10/2014   [facility]; neg   dislocation of right hip  2019   GREEN LIGHT LASER TURP (TRANSURETHRAL RESECTION OF PROSTATE  09/27/2012   Procedure: GREEN LIGHT LASER TURP (TRANSURETHRAL RESECTION OF PROSTATE;  Surgeon: Fredricka Bonine, MD;  Location: [facility];  Service: Urology;  Laterality: N/A;      JOINT REPLACEMENT Left Oct. 12, 2012   left total hip replacement by Dr. Mayer Camel   PROSTATE BIOPSY  2007 , 2009   X2 , Dr Thomasene Mohair  & Dr Terance Hart   PROSTATE SURGERY     Laser   TOTAL HIP ARTHROPLASTY Right 09/26/2017   TOTAL HIP ARTHROPLASTY Right 09/26/2017   Procedure: TOTAL HIP ARTHROPLASTY ANTERIOR APPROACH;  Surgeon: Frederik Pear, MD;  Location: [place];  Service: Orthopedics;  Laterality: Right;    Allergies  Allergies  Allergen Reactions   Demeclocycline Nausea Only    Nausea and constipation   Pravastatin Other (See Comments)    myopathy   Lisinopril Cough   Oxycodone     Mental confusion    History of Present Illness    Brian Hunt is a 76 y.o. male who presents via audio/video conferencing for a telehealth visit today.  Pt was last seen in cardiology clinic on 05/25/22 by Dr. Gwenlyn Found.  At that time Brian Hunt was doing well .  The patient is now pending procedure as outlined above. Since his last visit, he feels okay.  His claudication symptoms are well controlled on Pletal.  He tells me that his blood pressure did spike 2 to 3 weeks ago but he was not taking his amlodipine at that time.  He is now back on amlodipine 5 mg dail and his most recent blood pressure was 109/66.  He tells me that he exercises 3 days a week with resistance training, weightlifting, and riding a stationary bike.  He also enjoys fishing and was recently fishing in the [place].  He also plays guitar.  He has no issues with yard work or household tasks.  For this reason he scored a 5.62 METS  on the DASI.  This exceeds the 4 METS minimum requirement.  Per Dr. Gwenlyn Found, permissible to hold Pletal x 48 hours prior to procedure. Continue asa 81 mg daily.    Home Medications    Prior to Admission medications   Medication Sig Start Date End Date Taking? Authorizing Provider  aspirin EC 81 MG tablet Take 81 mg by mouth daily.    [provider]  cilostazol (PLETAL) 50 MG tablet Take 1 tablet (50 mg total) by mouth 2 (two) times daily. 07/20/21   Lorretta Harp, MD  Multiple Vitamins-Minerals (PRESERVISION AREDS 2 PO) Take 1 tablet by mouth daily.     [provider]  rosuvastatin (CRESTOR) 5 MG tablet TAKE 1 TABLET EVERY DAY 05/29/22   Janith Lima, MD  telmisartan (MICARDIS) 80 MG tablet TAKE 1 TABLET(80 MG) BY MOUTH DAILY 01/11/22   Skeet Latch, MD  zinc gluconate 50 MG tablet Take 1 tablet (50 mg total) by mouth daily. 05/30/22   Janith Lima, MD    Physical Exam    Vital Signs:  Brian Hunt does not have vital signs available for review today.  BP: 109/66  Given telephonic nature of communication, physical exam is limited. AAOx3. NAD. Normal affect.  Speech and respirations are unlabored.  Accessory Clinical Findings    None  Assessment & Plan    1.  Preoperative Cardiovascular Risk Assessment:  Brian Hunt perioperative risk of a major cardiac event is 0.4% according to the Revised Cardiac Risk Index (RCRI).  Therefore, he is at low risk for perioperative complications.   His functional capacity is good at 5.62 METs according to the [facility] Activity Status Index (DASI). Recommendations: According to ACC/AHA guidelines, no further cardiovascular testing needed.  The patient may proceed to surgery at acceptable risk.   Antiplatelet and/or Anticoagulation Recommendations: Patient can hold pletal x 48 hours prior to his surgery. Please resume when medically safe to do so.  Continue ASA 81 mg daily throughout the operative period.   A copy of  this note will be routed to requesting surgeon.  Time:   Today, I have spent 15 minutes with the patient with telehealth technology discussing medical history, symptoms, and management plan.     Elgie Collard, PA-C  07/19/2022, 2:12 PM

## 2022-07-20 ENCOUNTER — Telehealth: Payer: Self-pay | Admitting: Internal Medicine

## 2022-07-20 NOTE — Telephone Encounter (Signed)
Form given to PCP to review and sign.  

## 2022-07-20 NOTE — Telephone Encounter (Signed)
Received Pre-Operative Risk Assessment form from [facility]. Forms were printed and placed in Dr. Ronnald Ramp box at the front.

## 2022-07-21 ENCOUNTER — Telehealth (HOSPITAL_BASED_OUTPATIENT_CLINIC_OR_DEPARTMENT_OTHER): Payer: Self-pay | Admitting: Cardiovascular Disease

## 2022-07-21 NOTE — Telephone Encounter (Signed)
Called patient to discuss scheduling the follow up carotid duplex ordered by Dr. Oval Linsey for November 2023---pt states he is waiting for his shoulder surgery to be scheduled and will call back once he has that date.

## 2022-07-26 ENCOUNTER — Telehealth: Payer: Self-pay | Admitting: Internal Medicine

## 2022-07-26 DIAGNOSIS — Z716 Tobacco abuse counseling: Secondary | ICD-10-CM | POA: Diagnosis not present

## 2022-07-26 DIAGNOSIS — M19011 Primary osteoarthritis, right shoulder: Secondary | ICD-10-CM | POA: Diagnosis not present

## 2022-07-26 DIAGNOSIS — F17210 Nicotine dependence, cigarettes, uncomplicated: Secondary | ICD-10-CM | POA: Diagnosis not present

## 2022-07-26 NOTE — Telephone Encounter (Signed)
For our records:   We have received Pre-Op PW from [facility] to be signed by Dr. Ronnald Ramp and it has been placed in his box.  Upon completion please fax to: [phone]

## 2022-07-26 NOTE — Telephone Encounter (Signed)
Surgical clearance form has been signed and faxed back.

## 2022-07-26 NOTE — Telephone Encounter (Signed)
[facility] called and wants to know if Dr. Ronnald Ramp has received a surgical clearance form on this patient and if he has they would like to know the turn around time to have this completed.  Please advise.

## 2022-07-31 ENCOUNTER — Other Ambulatory Visit: Payer: Self-pay | Admitting: Orthopedic Surgery

## 2022-07-31 DIAGNOSIS — M25611 Stiffness of right shoulder, not elsewhere classified: Secondary | ICD-10-CM | POA: Diagnosis not present

## 2022-07-31 DIAGNOSIS — M19011 Primary osteoarthritis, right shoulder: Secondary | ICD-10-CM | POA: Diagnosis not present

## 2022-07-31 NOTE — Pre-Procedure Instructions (Addendum)
Surgical Instructions    Your procedure is scheduled on Tuesday, November 21.  Report to [facility] Main Entrance "A" at 12:00 P.M., then check in with the Admitting office.  Call this number if you have problems the morning of surgery:  [phone]   If you have any questions prior to your surgery date call [phone]: Open Monday-Friday 8am-4pm If you experience any cold or flu symptoms such as cough, fever, chills, shortness of breath, etc. between now and your scheduled surgery, please notify us at the above number     Remember:  Do not eat after midnight the night before your surgery  You may drink clear liquids until 11:00AM the morning of your surgery.   Clear liquids allowed are: Water, Non-Citrus Juices (without pulp), Carbonated Beverages, Clear Tea, Black Coffee ONLY (NO MILK, CREAM OR POWDERED CREAMER of any kind), and Gatorade    Take these medicines the morning of surgery with A SIP OF WATER:  amLODipine (NORVASC)  rosuvastatin (CRESTOR)   Follow your surgeon's instructions on when to stop Aspirin and Cilostazol (Pletal).  If no instructions were given by your surgeon then you will need to call the office to get those instructions.      As of today, STOP taking any Aleve, Naproxen, Ibuprofen, Motrin, Advil, Goody's, BC's, all herbal medications, fish oil, and all vitamins.     [facility] is not responsible for any belongings or valuables.    Do NOT Smoke (Tobacco/Vaping)  24 hours prior to your procedure  If you use a CPAP at night, you may bring your mask for your overnight stay.   Contacts, glasses, hearing aids, dentures or partials may not be worn into surgery, please bring cases for these belongings   For patients admitted to the hospital, discharge time will be determined by your treatment team.   Patients discharged the day of surgery will not be allowed to drive home, and someone needs to stay with them for 24 hours.   SURGICAL WAITING ROOM  VISITATION Patients having surgery or a procedure may have no more than 2 support people in the waiting area - these visitors may rotate.   Children under the age of 83 must have an adult with them who is not the patient. If the patient needs to stay at the hospital during part of their recovery, the visitor guidelines for inpatient rooms apply. Pre-op nurse will coordinate an appropriate time for 1 support person to accompany patient in pre-op.  This support person may not rotate.   Please refer to [email or web address] for the visitor guidelines for Inpatients (after your surgery is over and you are in a regular room).    Special instructions:     Oral Hygiene is also important to reduce your risk of infection.  Remember - BRUSH YOUR TEETH THE MORNING OF SURGERY WITH YOUR REGULAR TOOTHPASTE  [place]- Preparing for Total Shoulder Arthroplasty  Before surgery, you can play an important role. Because skin is not sterile, your skin needs to be as free of germs as possible. You can reduce the number of germs on your skin by using the following products.   Benzoyl Peroxide Gel  o Reduces the number of germs present on the skin  o Applied twice a day to shoulder area starting two days before surgery   Chlorhexidine Gluconate (CHG) Soap (instructions listed above on how to wash with CHG Soap)  o An antiseptic cleaner that kills germs and bonds with the skin to continue  killing germs even after washing  o Used for showering the night before surgery and morning of surgery   ==================================================================  Please follow these instructions carefully:  BENZOYL PEROXIDE 5% GEL  Please do not use if you have an allergy to benzoyl peroxide. If your skin becomes reddened/irritated stop using the benzoyl peroxide.  Starting two days before surgery, apply as follows:  1. Apply benzoyl peroxide in the  morning and at night. Apply after taking a shower. If you are not taking a shower clean entire shoulder front, back, and side along with the armpit with a clean wet washcloth.  2. Place a quarter-sized dollop on your SHOULDER and rub in thoroughly, making sure to cover the front, back, and side of your shoulder, along with the armpit.   2 Days prior to Surgery First Dose on ___11/19/23___ Morning Second Dose on __11/19/23_______ Night  Day Before Surgery First Dose on ___11/20/23____ Morning Night before surgery wash (entire body except face and private areas) with CHG Soap THEN Second Dose on __11/20/23____ Night   Morning of Surgery  wash BODY AGAIN with CHG Soap   4. Do NOT apply benzoyl peroxide gel on the day of surgery   [place]- Preparing For Surgery  Before surgery, you can play an important role. Because skin is not sterile, your skin needs to be as free of germs as possible. You can reduce the number of germs on your skin by washing with CHG (chlorahexidine gluconate) Soap before surgery.  CHG is an antiseptic cleaner which kills germs and bonds with the skin to continue killing germs even after washing.     Please do not use if you have an allergy to CHG or antibacterial soaps. If your skin becomes reddened/irritated stop using the CHG.  Do not shave (including legs and underarms) for at least 48 hours prior to first CHG shower. It is OK to shave your face.  Please follow these instructions carefully.     Shower the NIGHT BEFORE SURGERY and the MORNING OF SURGERY with CHG Soap.   If you chose to wash your hair, wash your hair first as usual with your normal shampoo. After you shampoo, rinse your hair and body thoroughly to remove the shampoo.  Then [employer] and genitals (private parts) with your normal soap and rinse thoroughly to remove soap.  After that Use CHG Soap as you would any other liquid soap. You can apply CHG directly to the skin and wash gently with a  scrungie or a clean washcloth.   Apply the CHG Soap to your body ONLY FROM THE NECK DOWN.  Do not use on open wounds or open sores. Avoid contact with your eyes, ears, mouth and genitals (private parts). Wash Face and genitals (private parts)  with your normal soap.   Wash thoroughly, paying special attention to the area where your surgery will be performed.  Thoroughly rinse your body with warm water from the neck down.  DO NOT shower/wash with your normal soap after using and rinsing off the CHG Soap.  Pat yourself dry with a CLEAN TOWEL.  8. Apply the Benzoyl Peroxide only the night before surgery.  Do Not use it the morning of surgery.  Wear CLEAN PAJAMAS to bed the night before surgery  Place CLEAN SHEETS on your bed the night before your surgery  DO NOT SLEEP WITH PETS.     Day of Surgery:  Take a shower with CHG soap. Wear Clean/Comfortable clothing the morning of  surgery Do not wear jewelry or makeup. Do not wear lotions, powders, perfumes/cologne or deodorant. Do not shave 48 hours prior to surgery.  Men may shave face and neck. Do not bring valuables to the hospital. Do not wear nail polish, gel polish, artificial nails, or any other type of covering on natural nails (fingers and toes) If you have artificial nails or gel coating that need to be removed by a nail salon, please have this removed prior to surgery. Artificial nails or gel coating may interfere with anesthesia's ability to adequately monitor your vital signs. Remember to brush your teeth WITH YOUR REGULAR TOOTHPASTE.    If you received a COVID test during your pre-op visit, it is requested that you wear a mask when out in public, stay away from anyone that may not be feeling well, and notify your surgeon if you develop symptoms. If you have been in contact with anyone that has tested positive in the last 10 days, please notify your surgeon.    Please read over the following fact sheets that you were  given.

## 2022-08-01 ENCOUNTER — Encounter (HOSPITAL_COMMUNITY)
Admission: RE | Admit: 2022-08-01 | Discharge: 2022-08-01 | Disposition: A | Discharge status: Home / Self Care | Payer: Medicare PPO | Source: Ambulatory Visit | Attending: Orthopedic Surgery | Admitting: Orthopedic Surgery

## 2022-08-01 ENCOUNTER — Encounter (HOSPITAL_COMMUNITY): Payer: Self-pay

## 2022-08-01 ENCOUNTER — Other Ambulatory Visit (HOSPITAL_BASED_OUTPATIENT_CLINIC_OR_DEPARTMENT_OTHER): Payer: Self-pay | Admitting: *Deleted

## 2022-08-01 ENCOUNTER — Ambulatory Visit (HOSPITAL_COMMUNITY)
Admission: RE | Admit: 2022-08-01 | Discharge: 2022-08-01 | Disposition: A | Discharge status: Home / Self Care | Payer: Medicare PPO | Source: Ambulatory Visit | Attending: Orthopedic Surgery | Admitting: Orthopedic Surgery

## 2022-08-01 ENCOUNTER — Other Ambulatory Visit: Payer: Self-pay

## 2022-08-01 VITALS — BP 135/79 | HR 88 | Temp 97.5°F | Resp 18 | Ht 67.0 in | Wt 162.9 lb

## 2022-08-01 DIAGNOSIS — Z01818 Encounter for other preprocedural examination: Secondary | ICD-10-CM | POA: Diagnosis not present

## 2022-08-01 DIAGNOSIS — I6522 Occlusion and stenosis of left carotid artery: Secondary | ICD-10-CM

## 2022-08-01 DIAGNOSIS — I10 Essential (primary) hypertension: Secondary | ICD-10-CM | POA: Insufficient documentation

## 2022-08-01 LAB — BASIC METABOLIC PANEL
Anion gap: 10 (ref 5–15)
BUN: 16 mg/dL (ref 8–23)
CO2: 26 mmol/L (ref 22–32)
Calcium: 9.4 mg/dL (ref 8.9–10.3)
Chloride: 95 mmol/L — ABNORMAL LOW (ref 98–111)
Creatinine, Ser: 0.9 mg/dL (ref 0.61–1.24)
GFR, Estimated: >60 mL/min (ref >60)
Glucose, Bld: 112 mg/dL — ABNORMAL HIGH (ref 70–99)
Potassium: 4.5 mmol/L (ref 3.5–5.1)
Sodium: 131 mmol/L — ABNORMAL LOW (ref 135–145)

## 2022-08-01 LAB — SURGICAL PCR SCREEN
MRSA, PCR: NEGATIVE
Staphylococcus aureus: NEGATIVE

## 2022-08-01 LAB — CBC
HCT: 41.4 % (ref 39.0–52.0)
Hemoglobin: 13.8 g/dL (ref 13.0–17.0)
MCH: 32.5 pg (ref 26.0–34.0)
MCHC: 33.3 g/dL (ref 30.0–36.0)
MCV: 97.4 fL (ref 80.0–100.0)
Platelets: 298 10*3/uL (ref 150–400)
RBC: 4.25 MIL/uL (ref 4.22–5.81)
RDW: 13.3 % (ref 11.5–15.5)
WBC: 8.6 10*3/uL (ref 4.0–10.5)
nRBC: 0 % (ref 0.0–0.2)

## 2022-08-01 NOTE — Progress Notes (Signed)
Surgical Instructions                 Your procedure is scheduled on Tuesday, November 21.             Report to [facility] Main Entrance "A" at 12:00 P.M., then check in with the Admitting office.             Call this number if you have problems the morning of surgery:             [phone]    If you have any questions prior to your surgery date call [phone]: Open Monday-Friday 8am-4pm If you experience any cold or flu symptoms such as cough, fever, chills, shortness of breath, etc. between now and your scheduled surgery, please notify us at the above number                  Remember:             Do not eat after midnight the night before your surgery   You may drink clear liquids until 11:00AM the morning of your surgery.   Clear liquids allowed are: Water, Non-Citrus Juices (without pulp), Carbonated Beverages, Clear Tea, Black Coffee ONLY (NO MILK, CREAM OR POWDERED CREAMER of any kind), and Gatorade      Enhanced Recovery after Surgery for Orthopedics Enhanced Recovery after Surgery is a protocol used to improve the stress on your body and your recovery after surgery.  Patient Instructions  The day of surgery (if you do NOT have diabetes):  Drink ONE (1) Pre-Surgery Clear Ensure by __11___ am the morning of surgery   This drink was given to you during your hospital  pre-op appointment visit. Nothing else to drink after completing the  Pre-Surgery Clear Ensure.          If you have questions, please contact your surgeon's office.                      Take these medicines the morning of surgery with A SIP OF WATER:  amLODipine (NORVASC)  rosuvastatin (CRESTOR)    Follow your surgeon's instructions on when to stop Aspirin and Cilostazol (Pletal).  If no instructions were given by your surgeon then you will need to call the office to get those instructions.         As of today, STOP taking any Aleve, Naproxen, Ibuprofen, Motrin, Advil, Goody's, BC's, all herbal  medications, fish oil, and all vitamins.       [place] is not responsible for any belongings or valuables.     Do NOT Smoke (Tobacco/Vaping)  24 hours prior to your procedure   If you use a CPAP at night, you may bring your mask for your overnight stay.   Contacts, glasses, hearing aids, dentures or partials may not be worn into surgery, please bring cases for these belongings   For patients admitted to the hospital, discharge time will be determined by your treatment team.   Patients discharged the day of surgery will not be allowed to drive home, and someone needs to stay with them for 24 hours.     SURGICAL WAITING ROOM VISITATION Patients having surgery or a procedure may have no more than 2 support people in the waiting area - these visitors may rotate.   Children under the age of 18 must have an adult with them who is not the patient. If the patient needs to stay at  the hospital during part of their recovery, the visitor guidelines for inpatient rooms apply. Pre-op nurse will coordinate an appropriate time for 1 support person to accompany patient in pre-op.  This support person may not rotate.    Please refer to [email or web address] for the visitor guidelines for Inpatients (after your surgery is over and you are in a regular room).      Special instructions:       Oral Hygiene is also important to reduce your risk of infection.  Remember - BRUSH YOUR TEETH THE MORNING OF SURGERY WITH YOUR REGULAR TOOTHPASTE   [place]- Preparing for Total Shoulder Arthroplasty   Before surgery, you can play an important role. Because skin is not sterile, your skin needs to be as free of germs as possible. You can reduce the number of germs on your skin by using the following products.    Benzoyl Peroxide Gel   o Reduces the number of germs present on the skin   o Applied twice a day to shoulder area starting two days before  surgery    Chlorhexidine Gluconate (CHG) Soap (instructions listed above on how to wash with CHG Soap)   o An antiseptic cleaner that kills germs and bonds with the skin to continue killing germs even after washing   o Used for showering the night before surgery and morning of surgery     ==================================================================   Please follow these instructions carefully:   BENZOYL PEROXIDE 5% GEL   Please do not use if you have an allergy to benzoyl peroxide. If your skin becomes reddened/irritated stop using the benzoyl peroxide.   Starting two days before surgery, apply as follows:   1. Apply benzoyl peroxide in the morning and at night. Apply after taking a shower. If you are not taking a shower clean entire shoulder front, back, and side along with the armpit with a clean wet washcloth.   2. Place a quarter-sized dollop on your SHOULDER and rub in thoroughly, making sure to cover the front, back, and side of your shoulder, along with the armpit.               2 Days prior to Surgery First Dose on ___11/19/23___ Morning Second Dose on __11/19/23_______ Night   Day Before Surgery First Dose on ___11/20/23____ Morning Night before surgery wash (entire body except face and private areas) with CHG Soap THEN Second Dose on __11/20/23____ Night    Morning of Surgery  wash BODY AGAIN with CHG Soap     4. Do NOT apply benzoyl peroxide gel on the day of surgery     [place]- Preparing For Surgery   Before surgery, you can play an important role. Because skin is not sterile, your skin needs to be as free of germs as possible. You can reduce the number of germs on your skin by washing with CHG (chlorahexidine gluconate) Soap before surgery.  CHG is an antiseptic cleaner which kills germs and bonds with the skin to continue killing germs even after washing.       Please do not use if you have an allergy to CHG or antibacterial soaps. If your skin  becomes reddened/irritated stop using the CHG.  Do not shave (including legs and underarms) for at least 48 hours prior to first CHG shower. It is OK to shave your face.   Please follow these instructions carefully.                 Shower  the NIGHT BEFORE SURGERY and the MORNING OF SURGERY with CHG Soap.              If you chose to wash your hair, wash your hair first as usual with your normal shampoo. After you shampoo, rinse your hair and body thoroughly to remove the shampoo.  Then [employer] and genitals (private parts) with your normal soap and rinse thoroughly to remove soap.   After that Use CHG Soap as you would any other liquid soap. You can apply CHG directly to the skin and wash gently with a scrungie or a clean washcloth.    Apply the CHG Soap to your body ONLY FROM THE NECK DOWN.  Do not use on open wounds or open sores. Avoid contact with your eyes, ears, mouth and genitals (private parts). Wash Face and genitals (private parts)  with your normal soap.    Wash thoroughly, paying special attention to the area where your surgery will be performed.   Thoroughly rinse your body with warm water from the neck down.   DO NOT shower/wash with your normal soap after using and rinsing off the CHG Soap.   Pat yourself dry with a CLEAN TOWEL.   8. Apply the Benzoyl Peroxide only the night before surgery.  Do Not use it the morning of surgery.   Wear CLEAN PAJAMAS to bed the night before surgery   Place CLEAN SHEETS on your bed the night before your surgery   DO NOT SLEEP WITH PETS.         Day of Surgery:   Take a shower with CHG soap. Wear Clean/Comfortable clothing the morning of surgery Do not wear jewelry or makeup. Do not wear lotions, powders, perfumes/cologne or deodorant. Do not shave 48 hours prior to surgery.  Men may shave face and neck. Do not bring valuables to the hospital. Do not wear nail polish, gel polish, artificial nails, or any other type of covering on  natural nails (fingers and toes) If you have artificial nails or gel coating that need to be removed by a nail salon, please have this removed prior to surgery. Artificial nails or gel coating may interfere with anesthesia's ability to adequately monitor your vital signs. Remember to brush your teeth WITH YOUR REGULAR TOOTHPASTE.       If you received a COVID test during your pre-op visit, it is requested that you wear a mask when out in public, stay away from anyone that may not be feeling well, and notify your surgeon if you develop symptoms. If you have been in contact with anyone that has tested positive in the last 10 days, please notify your surgeon.     Please read over the following fact sheets that you were given.              Revision History

## 2022-08-01 NOTE — Progress Notes (Addendum)
PCP - Scarlette Calico Cardiologist - Dr. Skeet Latch  PPM/ICD - Denies  Chest x-ray - 08/01/22 EKG - 08/01/22 Stress Test - 07/06/21 ECHO - 06/30/21 Cardiac Cath - Denies  Sleep Study - Denies  DM- Denies  Blood Thinner Instructions:Requested patient to get clarification on when to stop aspirin and pletal.   ERAS Protcol -Yes PRE-SURGERY Ensure    COVID TEST- NI   Anesthesia review: Yes see cardiology  Patient denies shortness of breath, fever, cough and chest pain at PAT appointment   All instructions explained to the patient, with a verbal understanding of the material. Patient agrees to go over the instructions while at home for a better understanding.  The opportunity to ask questions was provided.  Patient stated he currently has blood in his urine, which occurs about three times a year.  Just wanted to make someone aware. This nurse ask the patient to let his surgeon's office know as well.  The patient was going to call them for blood thinner instructions later today.

## 2022-08-02 ENCOUNTER — Encounter (HOSPITAL_COMMUNITY): Payer: Self-pay

## 2022-08-02 NOTE — Anesthesia Preprocedure Evaluation (Addendum)
Anesthesia Evaluation  Patient identified by MRN, date of birth, ID band Patient awake    Reviewed: Allergy & Precautions, NPO status , Patient's Chart, lab work & pertinent test results  Airway Mallampati: II  TM Distance: >3 FB Neck ROM: Full    Dental no notable dental hx. (+) Teeth Intact, Dental Advisory Given   Pulmonary COPD,  COPD inhaler, Patient abstained from smoking., former smoker   Pulmonary exam normal breath sounds clear to auscultation       Cardiovascular hypertension, Pt. on medications + Peripheral Vascular Disease  Normal cardiovascular exam+ Valvular Problems/Murmurs AS  Rhythm:Regular Rate:Normal  EKG RBBB  TTE 2022  1. Left ventricular ejection fraction, by estimation, is 60 to 65%. The  left ventricle has normal function. The left ventricle has no regional  wall motion abnormalities. There is mild concentric left ventricular  hypertrophy. The average left ventricular   global longitudinal strain is -18.0 %.   2. Right ventricular systolic function is normal. The right ventricular  size is normal.   3. Left atrial size was moderately dilated.   4. No evidence of mitral valve regurgitation. No evidence of mitral  stenosis. Moderate mitral annular calcification.   5. No pulmonary hypertension.   6. The aortic valve is normal in structure. Aortic valve regurgitation is  mild. Mild aortic valve stenosis.   7. The inferior vena cava is normal in size with greater than 50%  respiratory variability, suggesting right atrial pressure of 3 mmHg.   Stress Test 2022   The study is normal. The study is low risk.   RBBB with repolarization changes was present at baseline with no ST changes from baseline during infusion.  Occsaional PVCs were noted.   LV perfusion is normal. There is no evidence of ischemia. There is no evidence of infarction.   Left ventricular function is normal. End diastolic cavity size is  normal. End systolic cavity size is normal.   Prior study not available for comparison.     Neuro/Psych  PSYCHIATRIC DISORDERS Anxiety     negative neurological ROS     GI/Hepatic negative GI ROS, Neg liver ROS,,,  Endo/Other  negative endocrine ROS    Renal/GU negative Renal ROS  negative genitourinary   Musculoskeletal  (+) Arthritis ,    Abdominal   Peds  Hematology negative hematology ROS (+)   Anesthesia Other Findings History includes former smoker (quit 06/14/21), COPD, HTN, HLD, carotid artery disease, exertional dyspnea, RBBB, murmur (mild AI/AS 06/30/21 echo), PAD, skin cancer (BCC), BPH/prostatitis (s/p laser TURP 09/27/12), osteoarthritis (left THA 05/26/11; right THA 09/26/17).  Reproductive/Obstetrics                             Anesthesia Physical Anesthesia Plan  ASA: 3  Anesthesia Plan: General and Regional   Post-op Pain Management: Regional block* and Tylenol PO (pre-op)*   Induction: Intravenous  PONV Risk Score and Plan: 2 and Dexamethasone, Ondansetron and Treatment may vary due to age or medical condition  Airway Management Planned: Oral ETT  Additional Equipment:   Intra-op Plan:   Post-operative Plan: Extubation in OR  Informed Consent: I have reviewed the patients History and Physical, chart, labs and discussed the procedure including the risks, benefits and alternatives for the proposed anesthesia with the patient or authorized representative who has indicated his/her understanding and acceptance.     Dental advisory given  Plan Discussed with: CRNA  Anesthesia Plan Comments: (  )  Anesthesia Quick Evaluation  

## 2022-08-02 NOTE — Progress Notes (Signed)
Anesthesia Chart Review: Brian Hunt  Case: [id number] Date/Time: 08/08/22 1345   Procedure: TOTAL SHOULDER ARTHROPLASTY VS REVERSE SHOULDER ARTHROPLASTY (Right: Shoulder)   Anesthesia type: Choice   Pre-op diagnosis: RIGTH SHOULDER OSTEOARTHRITIS   Location: [place] ROOM 05 / [place] [facility]   Surgeons: Georgeanna Harrison, MD       DISCUSSION: Patient is a 76 year old male scheduled for the above procedure.  History includes former smoker (quit 06/14/21), COPD, HTN, HLD, carotid artery disease, exertional dyspnea, RBBB, murmur (mild AI/AS 06/30/21 echo), PAD, skin cancer (BCC), BPH/prostatitis (s/p laser TURP 09/27/12), osteoarthritis (left THA 05/26/11; right THA 09/26/17). For anesthesia history, he reported that after a surgery (occurring prior to 09/2017), he had a postoperative vagal reaction after transferring out of PACU.  Preoperative cardiology input outlined by Nicholes Rough, PA-C on 07/19/22. She wrote, "Preoperative Cardiovascular Risk Assessment:   Mr. Engelmann perioperative risk of a major cardiac event is 0.4% according to the Revised Cardiac Risk Index (RCRI).  Therefore, he is at low risk for perioperative complications.   His functional capacity is good at 5.62 METs according to the Duke Activity Status Index (DASI). Recommendations: According to ACC/AHA guidelines, no further cardiovascular testing needed.  The patient may proceed to surgery at acceptable risk.   Antiplatelet and/or Anticoagulation Recommendations: Patient can hold pletal x 48 hours prior to his surgery. Please resume when medically safe to do so.  Continue ASA 81 mg daily throughout the operative period."    PAT RN notes suggests he was unsure regarding perioperative ASA and Pletal instructions, and he was advised to clarify with surgeon. I also left a message for Wells Guiles at Dr. Dierdre Highman office to follow-up with Mr. Kargbo if not done so already.    08/01/22 CXR report is still in process. Anesthesia team to evaluate on the  day of surgery.    VS: BP 135/79   Pulse 88   Temp (!) 36.4 C (Oral)   Resp 18   Ht '5\' 7"'$  (1.702 m)   Wt 73.9 kg   SpO2 97%   BMI 25.51 kg/m    PROVIDERS: Janith Lima, MD is PCP  Skeet Latch, MD is cardiologist Quay Burow, MD is [facility] cardiologist    LABS: Labs reviewed: Acceptable for surgery. LFTs normal 05/25/22.  (all labs ordered are listed, but only abnormal results are displayed)  Labs Reviewed  BASIC METABOLIC PANEL - Abnormal; Notable for the following components:      Result Value   Sodium 131 (*)    Chloride 95 (*)    Glucose, Bld 112 (*)    All other components within normal limits  SURGICAL PCR SCREEN  CBC    IMAGES: CXR 08/01/22 (ordered by Dr. Mable Fill): In process.  CT Right shoulder 07/11/22: IMPRESSION: 1. Severe osteoarthritis of the glenohumeral joint. 2. Tendinosis of the supraspinatus and infraspinatus tendons. 3. Aortic Atherosclerosis (ICD10-I70.0) and Emphysema (ICD10-J43.9).  CTA Neck 07/22/21: IMPRESSION: 1. Chronic atherosclerosis. Progressed chronic plaque at the Left ICA origin and bulb since 2012, with stenosis there now numerically estimated at 66%. Brachiocephalic artery origin stenosis estimated at 73%. But no other hemodynamically significant stenosis in the neck. 2. Aortic Atherosclerosis (ICD10-I70.0) and Emphysema (ICD10-J43.9).   EKG: 08/01/22: Sinus rhythm with occasional Premature ventricular complexes Right bundle branch block Abnormal ECG When compared with ECG of 24-Sep-2017 10:51, PREVIOUS ECG IS PRESENT Confirmed by Dixie Dials ([id number]) on 08/01/2022 8:31:57 PM   CV: [place] Abd Aorta/Iliac 08/03/21: Summary:  - Abdominal Aorta:  The largest aortic measurement is 1.8 cm.  - Stenosis:  Atherosclerosis in the aorta and iliac arteries with no focal stensosis.  Technically challenging due to flash artifact and bowel gas.  - IVC/Iliac: There is no evidence of thrombus involving the IVC.    Nuclear  stress test 07/06/21:   The study is normal. The study is low risk.   RBBB with repolarization changes was present at baseline with no ST changes from baseline during infusion.  Occsaional PVCs were noted.   LV perfusion is normal. There is no evidence of ischemia. There is no evidence of infarction.   Left ventricular function is normal. End diastolic cavity size is normal. End systolic cavity size is normal.   Prior study not available for comparison.   Echo 06/30/21: IMPRESSIONS   1. Left ventricular ejection fraction, by estimation, is 60 to 65%. The  left ventricle has normal function. The left ventricle has no regional  wall motion abnormalities. There is mild concentric left ventricular  hypertrophy. The average left ventricular   global longitudinal strain is -18.0 %.   2. Right ventricular systolic function is normal. The right ventricular  size is normal.   3. Left atrial size was moderately dilated.   4. No evidence of mitral valve regurgitation. No evidence of mitral  stenosis. Moderate mitral annular calcification.   5. No pulmonary hypertension.   6. The aortic valve is normal in structure. Aortic valve regurgitation is  mild. Mild aortic valve stenosis.   7. The inferior vena cava is normal in size with greater than 50%  respiratory variability, suggesting right atrial pressure of 3 mmHg.  - Comparison(s): EF 65%, mild AS peak gradient 27 mmHg mean gradient 13  mmHg.  - Conclusion(s)/Recommendation(s): Normal LV function with mild AS otherwise  no significant abnormalities. Compared to prior study 03/07/2018 no  significant changes.    US Carotid 06/30/21: Summary:  - Right Carotid: Velocities in the right ICA are consistent with a stable 1-39% stenosis.  - Left Carotid: Velocities in the left ICA are consistent with a 1-39% stenosis, see note above. If clinically indicated, consider alternate imaging.  - Vertebrals:  Bilateral vertebral arteries demonstrate  antegrade flow.  - Subclavians: Normal flow hemodynamics were seen in bilateral subclavian arteries.  - CTA neck 07/22/21 showed: Bulky calcified plaque at the left ICA origin and bulb appears complex, and progressed since 2012 with bulb stenosis now numerically estimated up to 66 % with respect to the distal vessel.   Past Medical History:  Diagnosis Date   Anemia    Anxiety    Arthritis    Basal cell carcinoma    Basal Cell X2 , Dr Radford Pax   BPH (benign prostatic hyperplasia)    Bursitis of left hip    Dr. Ihor Gully   Carotid artery occlusion    Carotid stenosis 62/13/0865   Complication of anesthesia    pt. states he had vasal vagal reaction after last surgery,after he got to his room (entered 10/04/17)   COPD (chronic obstructive pulmonary disease) ([place])    Diverticulosis    Dyspnea    with exertion   Elevated PSA    Heart murmur    mild AI/AS 06/30/21 echo   Hyperlipidemia    Hypertension    Internal hemorrhoids    Leg pain 06/15/2021   PAD (peripheral artery disease) ([place]) 10/17/2021   Prostatitis    Dr Terance Hart   RBBB     Past Surgical History:  Procedure Laterality  Date   COLONOSCOPY  2010   negative; [facility]   CYSTOSCOPY  10/2014   [facility]; neg   dislocation of right hip  2019   GREEN LIGHT LASER TURP (TRANSURETHRAL RESECTION OF PROSTATE  09/27/2012   Procedure: GREEN LIGHT LASER TURP (TRANSURETHRAL RESECTION OF PROSTATE;  Surgeon: Fredricka Bonine, MD;  Location: [facility];  Service: Urology;  Laterality: N/A;      JOINT REPLACEMENT Left Oct. 12, 2012   left total hip replacement by Dr. Mayer Camel   PROSTATE BIOPSY  2007 , 2009   X2 , Dr Thomasene Mohair  & Dr Terance Hart   PROSTATE SURGERY     Laser   TOTAL HIP ARTHROPLASTY Right 09/26/2017   TOTAL HIP ARTHROPLASTY Right 09/26/2017   Procedure: TOTAL HIP ARTHROPLASTY ANTERIOR APPROACH;  Surgeon: Frederik Pear, MD;  Location: [facility];  Service: Orthopedics;  Laterality: Right;    MEDICATIONS:  amLODipine  (NORVASC) 5 MG tablet   aspirin EC 81 MG tablet   cilostazol (PLETAL) 50 MG tablet   hydrocortisone cream 1 %   rosuvastatin (CRESTOR) 5 MG tablet   telmisartan (MICARDIS) 80 MG tablet   zinc gluconate 50 MG tablet   No current facility-administered medications for this encounter.    Myra Gianotti, PA-C Surgical Short Stay/Anesthesiology [facility] Phone [phone] [facility] Phone [phone] 08/02/2022 4:25 PM

## 2022-08-03 ENCOUNTER — Encounter (HOSPITAL_COMMUNITY): Payer: Self-pay

## 2022-08-09 ENCOUNTER — Other Ambulatory Visit: Payer: Self-pay | Admitting: Orthopedic Surgery

## 2022-08-09 ENCOUNTER — Other Ambulatory Visit: Payer: Self-pay | Admitting: Internal Medicine

## 2022-08-09 DIAGNOSIS — M19011 Primary osteoarthritis, right shoulder: Secondary | ICD-10-CM | POA: Diagnosis not present

## 2022-08-14 NOTE — Progress Notes (Signed)
Spoke to patient and gave him updated location and arrival time for surgery on 08-17-22.  Patient was advised that surgery was now at [facility] and that he needed to arrive at 8:45 and check in at admitting.  Patient reminded no solid food after midnight and that he can have clear liquids until 8:00 am day of surgery.  Patient aware to hold Pletal 48 hours prior to surgery.  All questions answered and patient stated understanding.

## 2022-08-17 ENCOUNTER — Other Ambulatory Visit: Payer: Self-pay

## 2022-08-17 ENCOUNTER — Encounter (HOSPITAL_COMMUNITY): Payer: Self-pay | Admitting: Orthopedic Surgery

## 2022-08-17 ENCOUNTER — Ambulatory Visit (HOSPITAL_BASED_OUTPATIENT_CLINIC_OR_DEPARTMENT_OTHER): Payer: Medicare PPO | Admitting: Anesthesiology

## 2022-08-17 ENCOUNTER — Ambulatory Visit (HOSPITAL_COMMUNITY)
Admission: RE | Admit: 2022-08-17 | Discharge: 2022-08-17 | Disposition: A | Discharge status: Home / Self Care | Payer: Medicare PPO | Source: Ambulatory Visit | Attending: Orthopedic Surgery | Admitting: Orthopedic Surgery

## 2022-08-17 ENCOUNTER — Encounter (HOSPITAL_COMMUNITY)
Admission: RE | Disposition: A | Discharge status: Home / Self Care | Payer: Self-pay | Source: Ambulatory Visit | Attending: Orthopedic Surgery

## 2022-08-17 ENCOUNTER — Ambulatory Visit (HOSPITAL_COMMUNITY): Payer: Medicare PPO | Admitting: Vascular Surgery

## 2022-08-17 DIAGNOSIS — F419 Anxiety disorder, unspecified: Secondary | ICD-10-CM | POA: Diagnosis not present

## 2022-08-17 DIAGNOSIS — Z85828 Personal history of other malignant neoplasm of skin: Secondary | ICD-10-CM | POA: Diagnosis not present

## 2022-08-17 DIAGNOSIS — Z96643 Presence of artificial hip joint, bilateral: Secondary | ICD-10-CM | POA: Insufficient documentation

## 2022-08-17 DIAGNOSIS — J449 Chronic obstructive pulmonary disease, unspecified: Secondary | ICD-10-CM | POA: Insufficient documentation

## 2022-08-17 DIAGNOSIS — M19011 Primary osteoarthritis, right shoulder: Secondary | ICD-10-CM | POA: Insufficient documentation

## 2022-08-17 DIAGNOSIS — I10 Essential (primary) hypertension: Secondary | ICD-10-CM

## 2022-08-17 DIAGNOSIS — S43431A Superior glenoid labrum lesion of right shoulder, initial encounter: Secondary | ICD-10-CM

## 2022-08-17 DIAGNOSIS — Z87891 Personal history of nicotine dependence: Secondary | ICD-10-CM

## 2022-08-17 DIAGNOSIS — Z96611 Presence of right artificial shoulder joint: Secondary | ICD-10-CM | POA: Diagnosis not present

## 2022-08-17 DIAGNOSIS — E785 Hyperlipidemia, unspecified: Secondary | ICD-10-CM | POA: Diagnosis not present

## 2022-08-17 DIAGNOSIS — I739 Peripheral vascular disease, unspecified: Secondary | ICD-10-CM | POA: Insufficient documentation

## 2022-08-17 DIAGNOSIS — Z79899 Other long term (current) drug therapy: Secondary | ICD-10-CM | POA: Insufficient documentation

## 2022-08-17 DIAGNOSIS — G8918 Other acute postprocedural pain: Secondary | ICD-10-CM | POA: Diagnosis not present

## 2022-08-17 HISTORY — PX: TOTAL SHOULDER ARTHROPLASTY: SHX126

## 2022-08-17 SURGERY — ARTHROPLASTY, SHOULDER, TOTAL
Anesthesia: Regional | Site: Shoulder | Laterality: Right

## 2022-08-17 MED ORDER — FENTANYL CITRATE PF 50 MCG/ML IJ SOSY
50.0000 ug | PREFILLED_SYRINGE | INTRAMUSCULAR | Status: DC
  Administered 2022-08-17: 100 ug via INTRAVENOUS
  Filled 2022-08-17: qty 2

## 2022-08-17 MED ORDER — PHENYLEPHRINE HCL (PRESSORS) 10 MG/ML IV SOLN
INTRAVENOUS | Status: AC
  Filled 2022-08-17: qty 1

## 2022-08-17 MED ORDER — ORAL CARE MOUTH RINSE: 15.0000 mL | Freq: Once | OROMUCOSAL | Status: AC

## 2022-08-17 MED ORDER — BUPIVACAINE LIPOSOME 1.3 % IJ SUSP
INTRAMUSCULAR | Status: DC | PRN
  Administered 2022-08-17: 10 mL via PERINEURAL

## 2022-08-17 MED ORDER — LIDOCAINE HCL (CARDIAC) PF 100 MG/5ML IV SOSY
PREFILLED_SYRINGE | INTRAVENOUS | Status: DC | PRN
  Administered 2022-08-17: 20 mg via INTRATRACHEAL

## 2022-08-17 MED ORDER — ROCURONIUM BROMIDE 10 MG/ML (PF) SYRINGE
PREFILLED_SYRINGE | INTRAVENOUS | Status: DC | PRN
  Administered 2022-08-17: 100 mg via INTRAVENOUS

## 2022-08-17 MED ORDER — ONDANSETRON HCL 4 MG/2ML IJ SOLN
INTRAMUSCULAR | Status: AC
  Filled 2022-08-17: qty 2

## 2022-08-17 MED ORDER — CHLORHEXIDINE GLUCONATE 0.12 % MT SOLN
15.0000 mL | Freq: Once | OROMUCOSAL | Status: AC
  Administered 2022-08-17: 15 mL via OROMUCOSAL

## 2022-08-17 MED ORDER — FENTANYL CITRATE (PF) 100 MCG/2ML IJ SOLN
INTRAMUSCULAR | Status: DC | PRN
  Administered 2022-08-17: 50 ug via INTRAVENOUS

## 2022-08-17 MED ORDER — SUGAMMADEX SODIUM 200 MG/2ML IV SOLN
INTRAVENOUS | Status: DC | PRN
  Administered 2022-08-17: 200 mg via INTRAVENOUS

## 2022-08-17 MED ORDER — LACTATED RINGERS IV SOLN: INTRAVENOUS | Status: DC

## 2022-08-17 MED ORDER — FENTANYL CITRATE PF 50 MCG/ML IJ SOSY: 25.0000 ug | PREFILLED_SYRINGE | INTRAMUSCULAR | Status: DC | PRN

## 2022-08-17 MED ORDER — BUPIVACAINE HCL (PF) 0.5 % IJ SOLN
INTRAMUSCULAR | Status: DC | PRN
  Administered 2022-08-17: 15 mL via PERINEURAL

## 2022-08-17 MED ORDER — TRANEXAMIC ACID-NACL 1000-0.7 MG/100ML-% IV SOLN
1000.0000 mg | INTRAVENOUS | Status: AC
  Administered 2022-08-17: 1000 mg via INTRAVENOUS
  Filled 2022-08-17: qty 100

## 2022-08-17 MED ORDER — HYDROMORPHONE HCL 2 MG PO TABS: 2.0000 mg | ORAL_TABLET | ORAL | 0 refills | Status: AC | PRN

## 2022-08-17 MED ORDER — ESMOLOL HCL 100 MG/10ML IV SOLN
INTRAVENOUS | Status: DC | PRN
  Administered 2022-08-17: 20 mg via INTRAVENOUS

## 2022-08-17 MED ORDER — DEXAMETHASONE SODIUM PHOSPHATE 10 MG/ML IJ SOLN
INTRAMUSCULAR | Status: AC
  Filled 2022-08-17: qty 1

## 2022-08-17 MED ORDER — CEFAZOLIN SODIUM-DEXTROSE 2-4 GM/100ML-% IV SOLN: 2.0000 g | INTRAVENOUS | Status: DC

## 2022-08-17 MED ORDER — PHENYLEPHRINE 80 MCG/ML (10ML) SYRINGE FOR IV PUSH (FOR BLOOD PRESSURE SUPPORT)
PREFILLED_SYRINGE | INTRAVENOUS | Status: AC
  Filled 2022-08-17: qty 10

## 2022-08-17 MED ORDER — DEXAMETHASONE SODIUM PHOSPHATE 10 MG/ML IJ SOLN
INTRAMUSCULAR | Status: DC | PRN
  Administered 2022-08-17: 10 mg via INTRAVENOUS

## 2022-08-17 MED ORDER — FENTANYL CITRATE (PF) 100 MCG/2ML IJ SOLN
INTRAMUSCULAR | Status: AC
  Filled 2022-08-17: qty 2

## 2022-08-17 MED ORDER — PHENYLEPHRINE 80 MCG/ML (10ML) SYRINGE FOR IV PUSH (FOR BLOOD PRESSURE SUPPORT)
PREFILLED_SYRINGE | INTRAVENOUS | Status: DC | PRN
  Administered 2022-08-17: 160 ug via INTRAVENOUS

## 2022-08-17 MED ORDER — 0.9 % SODIUM CHLORIDE (POUR BTL) OPTIME
TOPICAL | Status: DC | PRN
  Administered 2022-08-17: 1000 mL

## 2022-08-17 MED ORDER — PHENYLEPHRINE HCL-NACL 20-0.9 MG/250ML-% IV SOLN
INTRAVENOUS | Status: DC | PRN
  Administered 2022-08-17: 25 ug/min via INTRAVENOUS

## 2022-08-17 MED ORDER — LIDOCAINE HCL (PF) 2 % IJ SOLN
INTRAMUSCULAR | Status: AC
  Filled 2022-08-17: qty 5

## 2022-08-17 MED ORDER — CEFAZOLIN SODIUM-DEXTROSE 2-4 GM/100ML-% IV SOLN
2.0000 g | INTRAVENOUS | Status: AC
  Administered 2022-08-17: 2 g via INTRAVENOUS
  Filled 2022-08-17: qty 100

## 2022-08-17 MED ORDER — PROPOFOL 10 MG/ML IV BOLUS
INTRAVENOUS | Status: DC | PRN
  Administered 2022-08-17: 40 mg via INTRAVENOUS
  Administered 2022-08-17: 100 mg via INTRAVENOUS

## 2022-08-17 MED ORDER — ONDANSETRON HCL 4 MG/2ML IJ SOLN
INTRAMUSCULAR | Status: DC | PRN
  Administered 2022-08-17: 4 mg via INTRAVENOUS

## 2022-08-17 MED ORDER — TIZANIDINE HCL 2 MG PO TABS: 2.0000 mg | ORAL_TABLET | Freq: Three times a day (TID) | ORAL | 0 refills | Status: DC | PRN

## 2022-08-17 MED ORDER — SODIUM CHLORIDE 0.9 % IR SOLN
Status: DC | PRN
  Administered 2022-08-17: 1000 mL

## 2022-08-17 MED ORDER — ROCURONIUM BROMIDE 10 MG/ML (PF) SYRINGE
PREFILLED_SYRINGE | INTRAVENOUS | Status: AC
  Filled 2022-08-17: qty 10

## 2022-08-17 MED ORDER — WATER FOR IRRIGATION, STERILE IR SOLN
Status: DC | PRN
  Administered 2022-08-17: 2000 mL

## 2022-08-17 MED ORDER — ACETAMINOPHEN 500 MG PO TABS
1000.0000 mg | ORAL_TABLET | Freq: Once | ORAL | Status: AC
  Administered 2022-08-17: 1000 mg via ORAL
  Filled 2022-08-17: qty 2

## 2022-08-17 SURGICAL SUPPLY — 66 items
AID PSTN UNV HD RSTRNT DISP (MISCELLANEOUS) ×1
BAG COUNTER SPONGE SURGICOUNT (BAG) IMPLANT
BAG SPEC THK2 15X12 ZIP CLS (MISCELLANEOUS) ×1
BAG SPNG CNTER NS LX DISP (BAG) ×1
BAG ZIPLOCK 12X15 (MISCELLANEOUS) ×1 IMPLANT
BIT DRILL 1.6MX128 (BIT) ×1 IMPLANT
BLADE SAW SGTL 73X25 THK (BLADE) ×1 IMPLANT
CEMENT BONE DEPUY (Cement) ×1 IMPLANT
CLSR STERI-STRIP ANTIMIC 1/2X4 (GAUZE/BANDAGES/DRESSINGS) IMPLANT
COOLER ICEMAN CLASSIC (MISCELLANEOUS) IMPLANT
COVER BACK TABLE 60X90IN (DRAPES) ×1 IMPLANT
COVER SURGICAL LIGHT HANDLE (MISCELLANEOUS) ×1 IMPLANT
DRAPE INCISE IOBAN 66X45 STRL (DRAPES) ×1 IMPLANT
DRAPE ORTHO SPLIT 77X108 STRL (DRAPES) ×2
DRAPE POUCH INSTRU U-SHP 10X18 (DRAPES) ×1 IMPLANT
DRAPE SURG 17X11 SM STRL (DRAPES) ×1 IMPLANT
DRAPE SURG ORHT 6 SPLT 77X108 (DRAPES) ×2 IMPLANT
DRAPE TOP 10253 STERILE (DRAPES) ×1 IMPLANT
DRAPE U-SHAPE 47X51 STRL (DRAPES) ×1 IMPLANT
DRSG AQUACEL AG ADV 3.5X 6 (GAUZE/BANDAGES/DRESSINGS) ×1 IMPLANT
DRSG AQUACEL AG ADV 3.5X10 (GAUZE/BANDAGES/DRESSINGS) IMPLANT
DURAPREP 26ML APPLICATOR (WOUND CARE) ×2 IMPLANT
ELECT BLADE TIP CTD 4 INCH (ELECTRODE) ×1 IMPLANT
ELECT REM PT RETURN 15FT ADLT (MISCELLANEOUS) ×1 IMPLANT
GLENOID AUGMENT CORT LG RT 25 (Shoulder) IMPLANT
GLOVE BIO SURGEON STRL SZ7.5 (GLOVE) ×1 IMPLANT
GLOVE BIOGEL PI IND STRL 6.5 (GLOVE) ×1 IMPLANT
GLOVE BIOGEL PI IND STRL 8 (GLOVE) ×1 IMPLANT
GLOVE SURG POLYISO LF SZ6.5 (GLOVE) ×1 IMPLANT
GOWN STRL REUS W/ TWL XL LVL3 (GOWN DISPOSABLE) ×1 IMPLANT
GOWN STRL REUS W/TWL XL LVL3 (GOWN DISPOSABLE) ×1
GUIDEWIRE GLENOID 2.5X220 (WIRE) IMPLANT
HANDPIECE INTERPULSE COAX TIP (DISPOSABLE) ×1
HEAD HUM AEQUALIS 52X19 (Head) IMPLANT
HEMOSTAT SURGICEL 2X14 (HEMOSTASIS) ×1 IMPLANT
HOOD PEEL AWAY T7 (MISCELLANEOUS) ×3 IMPLANT
HUMERAL STEM AEQUALIS 3BX74MM (Stem) ×1 IMPLANT
KIT BASIN OR (CUSTOM PROCEDURE TRAY) ×1 IMPLANT
KIT TURNOVER KIT A (KITS) IMPLANT
MANIFOLD NEPTUNE II (INSTRUMENTS) ×1 IMPLANT
NDL TROCAR POINT SZ 2 1/2 (NEEDLE) ×1 IMPLANT
NEEDLE TROCAR POINT SZ 2 1/2 (NEEDLE) ×1 IMPLANT
NS IRRIG 1000ML POUR BTL (IV SOLUTION) ×1 IMPLANT
PACK SHOULDER (CUSTOM PROCEDURE TRAY) ×1 IMPLANT
PAD COLD SHLDR WRAP-ON (PAD) IMPLANT
PROTECTOR NERVE ULNAR (MISCELLANEOUS) IMPLANT
RESTRAINT HEAD UNIVERSAL NS (MISCELLANEOUS) ×1 IMPLANT
RETRIEVER SUT HEWSON (MISCELLANEOUS) ×1 IMPLANT
SET HNDPC FAN SPRY TIP SCT (DISPOSABLE) ×1 IMPLANT
SLING ARM IMMOBILIZER LRG (SOFTGOODS) IMPLANT
SMARTMIX MINI TOWER (MISCELLANEOUS) ×1
SPONGE T-LAP 4X18 ~~LOC~~+RFID (SPONGE) ×2 IMPLANT
STEM HUMERAL AEQUALIS 3BX74MM (Stem) IMPLANT
STRIP CLOSURE SKIN 1/2X4 (GAUZE/BANDAGES/DRESSINGS) ×1 IMPLANT
SUCTION FRAZIER HANDLE 12FR (TUBING) ×1
SUCTION TUBE FRAZIER 12FR DISP (TUBING) ×1 IMPLANT
SUPPORT WRAP ARM LG (MISCELLANEOUS) ×1 IMPLANT
SUT ETHIBOND 2 V 37 (SUTURE) ×1 IMPLANT
SUT MNCRL AB 4-0 PS2 18 (SUTURE) ×1 IMPLANT
SUT VIC AB 2-0 CT1 27 (SUTURE) ×2
SUT VIC AB 2-0 CT1 TAPERPNT 27 (SUTURE) ×2 IMPLANT
TAPE LABRALWHITE 1.5X36 (TAPE) ×1 IMPLANT
TAPE SUT LABRALTAP WHT/BLK (SUTURE) ×1 IMPLANT
TOWEL OR 17X26 10 PK STRL BLUE (TOWEL DISPOSABLE) ×1 IMPLANT
TOWER SMARTMIX MINI (MISCELLANEOUS) ×1 IMPLANT
WATER STERILE IRR 1000ML POUR (IV SOLUTION) ×1 IMPLANT

## 2022-08-17 NOTE — Anesthesia Procedure Notes (Signed)
Procedure Name: Intubation Date/Time: 08/17/2022 11:25 AM  Performed by: Pilar Grammes, CRNAPre-anesthesia Checklist: Patient identified, Emergency Drugs available, Suction available, Patient being monitored and Timeout performed Patient Re-evaluated:Patient Re-evaluated prior to induction Oxygen Delivery Method: Circle system utilized Preoxygenation: Pre-oxygenation with 100% oxygen Induction Type: IV induction Ventilation: Mask ventilation without difficulty Laryngoscope Size: Miller and 2 Grade View: Grade I Tube type: Oral Tube size: 7.5 mm Number of attempts: 1 Airway Equipment and Method: Stylet Placement Confirmation: positive ETCO2, ETT inserted through vocal cords under direct vision, CO2 detector and breath sounds checked- equal and bilateral Secured at: 2 cm Tube secured with: Tape Dental Injury: Teeth and Oropharynx as per pre-operative assessment

## 2022-08-17 NOTE — Transfer of Care (Signed)
Immediate Anesthesia Transfer of Care Note  Patient: Brian Hunt  Procedure(s) Performed: TOTAL SHOULDER ARTHROPLASTY WITH AUGMENTED GLENOID (Right: Shoulder)  Patient Location: PACU  Anesthesia Type:General  Level of Consciousness: awake, alert , and patient cooperative  Airway & Oxygen Therapy: Patient connected to face mask oxygen  Post-op Assessment: Report given to RN and Post -op Vital signs reviewed and stable  Post vital signs: stable  Last Vitals:  Vitals Value Taken Time  BP 158/83 08/17/22 1330  Temp    Pulse 66 08/17/22 1330  Resp 15 08/17/22 1330  SpO2 95 % 08/17/22 1330  Vitals shown include unvalidated device data.  Last Pain:  Vitals:   08/17/22 1045  TempSrc:   PainSc: 0-No pain      Patients Stated Pain Goal: 4 (00/92/33 0076)  Complications: No notable events documented.

## 2022-08-17 NOTE — Anesthesia Postprocedure Evaluation (Signed)
Anesthesia Post Note  Patient: JAVONN GAUGER  Procedure(s) Performed: TOTAL SHOULDER ARTHROPLASTY WITH AUGMENTED GLENOID (Right: Shoulder)     Patient location during evaluation: PACU Anesthesia Type: Regional and General Level of consciousness: awake and alert Pain management: pain level controlled Vital Signs Assessment: post-procedure vital signs reviewed and stable Respiratory status: spontaneous breathing, nonlabored ventilation, respiratory function stable and patient connected to nasal cannula oxygen Cardiovascular status: blood pressure returned to baseline and stable Postop Assessment: no apparent nausea or vomiting Anesthetic complications: no  No notable events documented.  Last Vitals:  Vitals:   08/17/22 1530 08/17/22 1600  BP: (!) 142/72 (!) 151/75  Pulse: 74 76  Resp: 14 16  Temp:    SpO2: 90% 92%    Last Pain:  Vitals:   08/17/22 1600  TempSrc:   PainSc: 0-No pain                 Janette Harvie L Valeria Krisko

## 2022-08-17 NOTE — Anesthesia Procedure Notes (Signed)
Anesthesia Regional Block: Interscalene brachial plexus block   Pre-Anesthetic Checklist: , timeout performed,  Correct Patient, Correct Site, Correct Laterality,  Correct Procedure, Correct Position, site marked,  Risks and benefits discussed,  Pre-op evaluation,  At surgeon's request and post-op pain management  Laterality: Right  Prep: Maximum Sterile Barrier Precautions used, chloraprep       Needles:  Injection technique: Single-shot  Needle Type: Echogenic Stimulator Needle     Needle Length: 5cm  Needle Gauge: 21     Additional Needles:   Procedures:,,,, ultrasound used (permanent image in chart),,    Narrative:  Start time: 08/17/2022 10:50 AM End time: 08/17/2022 10:53 AM Injection made incrementally with aspirations every 5 mL. Anesthesiologist: Freddrick March, MD

## 2022-08-17 NOTE — Op Note (Signed)
Procedure(s): TOTAL SHOULDER ARTHROPLASTY WITH AUGMENTED GLENOID Procedure Note  Brian Hunt male 76 y.o. 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 male  With endstage right shoulder arthritis. Pain and dysfunction interfered with quality of life and nonoperative treatment with activity modification, NSAIDS and injections failed.     Surgeon: Rhae Hammock   Assistants: Sheryle Hail PA-C [name] was present and scrubbed throughout the procedure and was essential in positioning, retraction, exposure, and closure)  Anesthesia: General endotracheal anesthesia with preoperative interscalene block given by the attending anesthesiologist     Procedure Detail  TOTAL SHOULDER ARTHROPLASTY WITH AUGMENTED GLENOID  Findings: Tornier flex anatomic press-fit size 3 stem with a 52 x 19 head, cemented size 25 degree large augmented performed plus Cortiloc glenoid.   A lesser tuberosity osteotomy was perform and repaired at the conclusion of the procedure.  Estimated Blood Loss:  200 mL         Drains: None   Blood Given: none          Specimens: none        Complications:  * No complications entered in OR log *         Disposition: PACU - hemodynamically stable.         Condition: stable    Procedure:   The patient was identified in the preoperative holding area where I personally marked the operative extremity after verifying with the patient and consent. He  was taken to the operating room where He was transferred to the   operative table.  The patient received an interscalene block in   the holding area by the attending anesthesiologist.  General anesthesia was induced   in the operating room  without complication.  The patient did receive IV  Ancef prior to the commencement of the procedure.  The patient was   placed in the beach-chair position with the back raised about 30   degrees.  The nonoperative extremity and head and neck were carefully   positioned and padded protecting against neurovascular compromise.  The   left upper extremity was then prepped and draped in the standard sterile   fashion.    The appropriate operative time-out was performed with   Anesthesia, the perioperative staff, as well as myself and we all agreed   that the right side was the correct operative site.  An approximately   10 cm incision was made from the tip of the coracoid to the center point of the   humerus at the level of the axilla.  Dissection was carried down sharply   through subcutaneous tissues and cephalic vein was identified and taken   laterally with the deltoid.  The pectoralis major was taken medially.  The   upper 1 cm of the pectoralis major was released from its attachment on   the humerus.  The clavipectoral fascia was incised just lateral to the   conjoined tendon.  This incision was carried up to but not into the   coracoacromial ligament.  Digital palpation was used to prove   integrity of the axillary nerve which was protected throughout the   procedure.  Musculocutaneous nerve was not palpated in the operative   field.  Conjoined tendon  was then retracted gently medially and the   deltoid laterally.  Anterior circumflex humeral vessels were clamped and   coagulated.  The soft tissues overlying the biceps was incised and this   incision was carried across the transverse humeral ligament to the base   of the coracoid.  The biceps was noted to be severely degenerated. It was released from the superior labrum. The biceps was then tenodesed to the soft tissue just above   pectoralis major and the remaining portion of the biceps superiorly was   excised.  An osteotomy was  performed at the lesser tuberosity.  The capsule was then   released all the way down to the 6 o'clock position of the humeral head.   The humeral head was then delivered with simultaneous adduction,   extension and external rotation.  All humeral osteophytes were removed   and the anatomic neck of the humerus was marked and cut free hand at   approximately 25 degrees retroversion within about 3 mm of the cuff   reflection posteriorly.  The head size was estimated to be a 52 medium   offset.  At that point, the humeral head was retracted posteriorly with   a Fukuda retractor.   Remaining portion of the capsule was released at the base of the   coracoid.  The remaining biceps anchor and the entire anterior-inferior   labrum was excised.  The posterior labrum was also excised but the   posterior capsule was not released.  The glenoid was noted to be B2 morphology.  the guidepin was placed bicortically with perform plus anterior glenoid referencing guide.  The reamer was used to ream to the halfway point anteriorly.  The anterior reference hole was then drilled.  The posterior angled reamer was then used starting at 15 degrees advancing to 25 until the posterior half of the glenoid was appropriately reamed.  The checker was used to ensure appropriate concentric fit.  The peripheral holes were then drilled followed by the the center hole and none of the holes   exited the glenoid wall.  The trial was placed and felt to be an appropriate fit.  I then pulse irrigated these holes and dried   them with Surgicel.  The three peripheral holes were then   pressurized cemented and the anchor peg glenoid was placed and impacted   with an excellent fit.  The glenoid was a large 25 degree perform plus component.  The proximal humerus was then again exposed taking care not to displace the glenoid.    The entry awl was used followed by sounding reamers and then sequentially broached from size 1 to 3. This was then  left in place and the calcar planer was used. Trial head was placed with a 52 x 19.  With the trial implantation of the component,  there was approximately 50% posterior translation with immediate snap back to the   anatomic position.  With forward elevation, there was no tendency   towards posterior subluxation.   The trial was removed and the final implant was prepared on a back table.  The trial was removed and the final implant was prepared on a back table.   3 small holes were drilled on the medial side of the lesser tuberosity osteotomy, through which 2 labral tapes were passed. The implant was then placed through the loop of the 2 labral tapes and impacted with an excellent press-fit. This achieved excellent anatomic reconstruction of the proximal humerus.  The joint was then copiously irrigated with pulse lavage.  The subscapularis and   lesser tuberosity osteotomy were then repaired using the 2 labral tapes previously passed in a double row fashion with horizontal mattress sutures medially brought over through bone tunnels tied over a bone bridge laterally.   One #1 Ethibond was placed at the rotator interval just above   the lesser tuberosity. Copious irrigation was used. Skin was closed with 2-0 Vicryl sutures in the deep dermal layer and 4-0 Monocryl in a subcuticular  running fashion.  Sterile dressings were then applied including Aquacel.  The patient was placed in a sling and allowed to awaken from general anesthesia and taken to the recovery room in stable condition.      POSTOPERATIVE PLAN:  Early passive range of motion will be allowed with the goal of 0 degrees external rotation and 90 degrees forward elevation.  No internal rotation at this time.  No active motion of the arm until the lesser tuberosity heals.  The patient will be observed in the recovery room and if his pain is well-controlled with regional anesthesia and he is hemodynamically stable he can be discharged home today with  family.

## 2022-08-17 NOTE — Discharge Instructions (Addendum)
Discharge Instructions after Total Shoulder Arthroplasty   A sling has been provided for you. Remove the sling 5 times each day to perform motion exercises. After the first 48 to 72 hours, discontinue using the sling. You should use the sling as a protective device, if you are in a crowd.  Use ice on the shoulder intermittently over the first 48 hours after surgery.  Pain medication has been prescribed for you.  Use your medication liberally over the first 48 hours, and then begin to taper your use. You may take Extra Strength Tylenol or Tylenol only in place of the pain pills. DO NOT take ANY nonsteroidal anti-inflammatory pain medications: Advil, Motrin, Ibuprofen, Aleve, Naproxen, or Naprosyn. Take one aspirin a day for 2 weeks after surgery, unless you have an aspirin sensitivity/allergy or asthma. Leave your dressing on until your first follow up visit.  You may shower with the dressing.  Hold your arm as if you still have your sling on while you shower. Active reaching and lifting are not permitted. You may use the operative arm for activities of daily living that do not require the operative arm to leave the side of the body, such as eating, drinking, bathing, etc.  Three to 5 times each day you should perform assisted overhead reaching and external rotation (outward turning) exercises with the operative arm. You were taught these exercises prior to discharge. Both exercises should be done with the non-operative arm used as the "therapist arm" while the operative arm remains relaxed. Ten of each exercise should be done three to five times each day.   Overhead reach is helping to lift your stiff arm up as high as it will go. To stretch your overhead reach, lie flat on your back, relax, and grasp the wrist of the tight shoulder with your opposite hand. Using the power in your opposite arm, bring the stiff arm up as far as it is comfortable. Start holding it for ten seconds and then work up to where  you can hold it for a count of 30. Breathe slowly and deeply while the arm is moved. Repeat this stretch ten times, trying to help the ar up a little higher each time.     External rotation is turning the arm out to the side while your elbow stays close to your body. External rotation is best stretched while you are lying on your back. Hold a cane, yardstick, broom handle, or dowel in both hands. Bend both elbows to a right angle. Use steady, gentle force from your normal arm to rotate the hand of the stiff shoulder out away from your body. Continue the rotation until it is straight in front of you holding it there for a count of 10. Do not go beyond this level of rotation until seen back by Dr. Chandler. Repeat this exercise ten times slowly.      Please call [phone] during normal business hours or [phone] after hours for any problems. Including the following:  - excessive redness of the incisions - drainage for more than 4 days - fever of more than 101.5 F  *Please note that pain medications will not be refilled after hours or on weekends.   Dental Antibiotics:  In most cases prophylactic antibiotics for Dental procdeures after total joint surgery are not necessary.  Exceptions are as follows:  1. History of prior total joint infection  2. Severely immunocompromised (Organ Transplant, cancer chemotherapy, Rheumatoid biologic meds such as Humera)  3. Poorly controlled   diabetes (A1C &gt; 8.0, blood glucose over 200)  If you have one of these conditions, contact your surgeon for an antibiotic prescription, prior to your dental procedure.  

## 2022-08-17 NOTE — H&P (Signed)
Brian Hunt is an 76 y.o. male.   Chief Complaint: Right shoulder pain and dysfunction HPI: Right shoulder advanced arthritis with posterior glenoid bone loss.  Failed extensive conservative treatment.  Pain interferes with sleep and quality of life.  Past Medical History:  Diagnosis Date   Anemia    Anxiety    Arthritis    Basal cell carcinoma    Basal Cell X2 , Dr Radford Pax   BPH (benign prostatic hyperplasia)    Bursitis of left hip    Dr. 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 echo   Hyperlipidemia    Hypertension    Internal hemorrhoids    Leg pain 06/15/2021   PAD (peripheral artery disease) ([facility]) 10/17/2021   Prostatitis    Dr Terance Hart   RBBB     Past Surgical History:  Procedure Laterality Date   COLONOSCOPY  2010   negative; [facility]   CYSTOSCOPY  10/2014   [facility]; neg   dislocation of right hip  2019   GREEN LIGHT LASER TURP (TRANSURETHRAL RESECTION OF PROSTATE  09/27/2012   Procedure: GREEN LIGHT LASER TURP (TRANSURETHRAL RESECTION OF PROSTATE;  Surgeon: Fredricka Bonine, MD;  Location: [facility];  Service: Urology;  Laterality: N/A;      JOINT REPLACEMENT Left Oct. 12, 2012   left total hip replacement by Dr. Mayer Camel   PROSTATE BIOPSY  2007 , 2009   X2 , Dr Thomasene Mohair  & Dr Terance Hart   PROSTATE SURGERY     Laser   TOTAL HIP ARTHROPLASTY Right 09/26/2017   TOTAL HIP ARTHROPLASTY Right 09/26/2017   Procedure: TOTAL HIP ARTHROPLASTY ANTERIOR APPROACH;  Surgeon: Frederik Pear, MD;  Location: [facility];  Service: Orthopedics;  Laterality: Right;    Family History  Problem Relation Age of Onset   Arthritis Mother    Vascular Disease Mother        Varicose Veins   Hypertension  Mother    Hyperlipidemia Mother    Transient ischemic attack Mother    Heart disease Mother        After age 22   Stroke Mother    COPD Father    Bone cancer Maternal Grandmother    Heart attack Paternal Grandmother 48   Colon cancer Son 44   Turner syndrome Daughter    Asthma Neg Hx    Diabetes Neg Hx    Pancreatic cancer Neg Hx    Esophageal cancer Neg Hx    Stomach cancer Neg Hx    Liver disease Neg Hx    Rectal cancer Neg Hx    Social History:  reports that he quit smoking about 14 months ago. His smoking use included cigarettes. He has never used smokeless tobacco. He reports that he does not drink alcohol and does not use drugs.  Allergies:  Allergies  Allergen Reactions   Demeclocycline Nausea Only    Nausea and constipation   Pravastatin Other (See Comments)    myopathy   Lisinopril Cough   Oxycodone     Mental confusion    Medications Prior to Admission  Medication Sig Dispense Refill   amLODipine (NORVASC)  5 MG tablet TAKE 1 TABLET(5 MG) BY MOUTH DAILY 90 tablet 1   aspirin EC 81 MG tablet Take 81 mg by mouth daily.     cilostazol (PLETAL) 50 MG tablet Take 1 tablet (50 mg total) by mouth 2 (two) times daily. Please contact the office to schedule appointment for additional refills. 180 tablet 0   hydrocortisone cream 1 % Apply 1 Application topically daily as needed for itching.     rosuvastatin (CRESTOR) 5 MG tablet TAKE 1 TABLET EVERY DAY 90 tablet 1   telmisartan (MICARDIS) 80 MG tablet TAKE 1 TABLET(80 MG) BY MOUTH DAILY 90 tablet 3   zinc gluconate 50 MG tablet Take 1 tablet (50 mg total) by mouth daily. 90 tablet 1    No results found for this or any previous visit (from the past 48 hour(s)). No results found.  Review of Systems  All other systems reviewed and are negative.   Blood pressure (!) 157/77, pulse 63, temperature 98 F (36.7 C), temperature source Oral, resp. rate 18, height '5\' 7"'$  (1.702 m), weight 73.9 kg, SpO2 96 %. Physical  Exam Constitutional:      Appearance: He is well-developed.  HENT:     Head: Atraumatic.  Eyes:     Extraocular Movements: Extraocular movements intact.  Cardiovascular:     Pulses: Normal pulses.  Pulmonary:     Effort: Pulmonary effort is normal.  Musculoskeletal:     Comments: Right shoulder pain with limited range of motion. NVID  Skin:    General: Skin is warm and dry.  Neurological:     Mental Status: He is alert and oriented to person, place, and time.  Psychiatric:        Mood and Affect: Mood normal.      Assessment/Plan Right shoulder end-stage osteoarthritis with posterior glenoid bone loss Plan right total shoulder replacement with augmented glenoid component Risks / benefits of surgery discussed Consent on chart  NPO for OR Preop antibiotics   Rhae Hammock, MD 08/17/2022, 10:29 AM

## 2022-08-17 NOTE — Evaluation (Signed)
Occupational Therapy Evaluation Patient Details Name: Brian Hunt MRN: [id number] DOB: January 14, 1946 Today's Date: 08/17/2022   History of Present Illness Patietn is s/p TOTAL SHOULDER ARTHROPLASTY WITH AUGMENTED GLENOID   Clinical Impression   [name] is a 76 year old man s/p shoulder replacement without functional use of right dominant upper extremity secondary to effects of surgery and interscalene block and shoulder precautions. Therapist provided education and instruction to patient and spouse in regards to exercises, precautions, positioning, donning upper extremity clothing and bathing while maintaining shoulder precautions, ice and edema management and donning/doffing sling. Patient and spouse verbalized understanding and demonstrated as needed. Patient needed assistance to donn shirt, underwear, pants, socks and shoes and provided with instruction on compensatory strategies to perform ADLs. Patient to follow up with MD for further therapy needs.        Recommendations for follow up therapy are one component of a multi-disciplinary discharge planning process, led by the attending physician.  Recommendations may be updated based on patient status, additional functional criteria and insurance authorization.   Follow Up Recommendations  Follow physician's recommendations for discharge plan and follow up therapies     Assistance Recommended at Discharge PRN  Patient can return home with the following A little help with bathing/dressing/bathroom;Assistance with cooking/housework    Functional Status Assessment  Patient has had a recent decline in their functional status and demonstrates the ability to make significant improvements in function in a reasonable and predictable amount of time.  Equipment Recommendations  None recommended by OT    Recommendations for Other Services       Precautions / Restrictions Precautions Precautions: Shoulder Type of Shoulder  Precautions: No AROM, NO PROM Shoulder Interventions: Shoulder sling/immobilizer;Off for dressing/bathing/exercises Precaution Booklet Issued:  (handouts) Required Braces or Orthoses: Sling Restrictions Weight Bearing Restrictions: Yes RUE Weight Bearing: Non weight bearing      Mobility Bed Mobility Overal bed mobility: Independent                  Transfers Overall transfer level: Independent                        Balance Overall balance assessment: No apparent balance deficits (not formally assessed)                                         ADL either performed or assessed with clinical judgement   ADL Overall ADL's : Needs assistance/impaired Eating/Feeding: Set up   Grooming: Modified independent   Upper Body Bathing: Minimal assistance   Lower Body Bathing: Modified independent   Upper Body Dressing : Moderate assistance   Lower Body Dressing: Minimal assistance   Toilet Transfer: Supervision/safety   Toileting- Clothing Manipulation and Hygiene: Supervision/safety       Functional mobility during ADLs: Supervision/safety       Vision Patient Visual Report: No change from baseline       Perception     Praxis      Pertinent Vitals/Pain Pain Assessment Pain Assessment: No/denies pain     Hand Dominance     Extremity/Trunk Assessment Upper Extremity Assessment Upper Extremity Assessment: RUE deficits/detail RUE Deficits / Details: impaired motor control and sensation secondary to block   Lower Extremity Assessment Lower Extremity Assessment: Overall WFL for tasks assessed   Cervical / Trunk Assessment Cervical / Trunk Assessment: Normal  Communication     Cognition Arousal/Alertness: Awake/alert Behavior During Therapy: WFL for tasks assessed/performed Overall Cognitive Status: Within Functional Limits for tasks assessed                                       General Comments        Exercises     Shoulder Instructions      Home Living Family/patient expects to be discharged to:: Private residence Living Arrangements: Spouse/significant other Available Help at Discharge: Family;Available 24 hours/day                                    Prior Functioning/Environment                          OT Problem List: Decreased strength;Decreased range of motion;Impaired UE functional use;Pain      OT Treatment/Interventions:      OT Goals(Current goals can be found in the care plan section) Acute Rehab OT Goals OT Goal Formulation: All assessment and education complete, DC therapy  OT Frequency:      Co-evaluation              AM-PAC OT "6 Clicks" Daily Activity     Outcome Measure Help from another person eating meals?: A Little Help from another person taking care of personal grooming?: None Help from another person toileting, which includes using toliet, bedpan, or urinal?: None Help from another person bathing (including washing, rinsing, drying)?: A Little Help from another person to put on and taking off regular upper body clothing?: A Lot Help from another person to put on and taking off regular lower body clothing?: A Lot 6 Click Score: 18   End of Session Nurse Communication:  (OT education complete)  Activity Tolerance: Patient tolerated treatment well Patient left: in chair;with call bell/phone within reach;with family/visitor present  OT Visit Diagnosis: Pain                Time: 8144-8185 OT Time Calculation (min): 24 min Charges:  OT General Charges $OT Visit: 1 Visit OT Evaluation $OT Eval Low Complexity: 1 Low OT Treatments $Self Care/Home Management : 8-22 mins  Gustavo Lah, OTR/L Acute Care Rehab Services  Office [phone]   Lenward Chancellor 08/17/2022, 4:50 PM

## 2022-08-21 ENCOUNTER — Encounter (HOSPITAL_COMMUNITY): Payer: Self-pay | Admitting: Orthopedic Surgery

## 2022-08-30 DIAGNOSIS — M19011 Primary osteoarthritis, right shoulder: Secondary | ICD-10-CM | POA: Diagnosis not present

## 2022-09-25 ENCOUNTER — Ambulatory Visit (HOSPITAL_BASED_OUTPATIENT_CLINIC_OR_DEPARTMENT_OTHER): Payer: Medicare PPO

## 2022-09-25 DIAGNOSIS — I6522 Occlusion and stenosis of left carotid artery: Secondary | ICD-10-CM

## 2022-09-27 DIAGNOSIS — M19011 Primary osteoarthritis, right shoulder: Secondary | ICD-10-CM | POA: Diagnosis not present

## 2022-09-28 DIAGNOSIS — R531 Weakness: Secondary | ICD-10-CM | POA: Diagnosis not present

## 2022-09-28 DIAGNOSIS — Z4789 Encounter for other orthopedic aftercare: Secondary | ICD-10-CM | POA: Diagnosis not present

## 2022-09-28 DIAGNOSIS — M25511 Pain in right shoulder: Secondary | ICD-10-CM | POA: Diagnosis not present

## 2022-09-29 ENCOUNTER — Ambulatory Visit (HOSPITAL_BASED_OUTPATIENT_CLINIC_OR_DEPARTMENT_OTHER): Payer: Medicare PPO | Admitting: Cardiovascular Disease

## 2022-09-29 ENCOUNTER — Encounter (HOSPITAL_BASED_OUTPATIENT_CLINIC_OR_DEPARTMENT_OTHER): Payer: Self-pay | Admitting: Cardiovascular Disease

## 2022-09-29 VITALS — BP 156/78 | HR 88 | Ht 66.0 in | Wt 167.1 lb

## 2022-09-29 DIAGNOSIS — I350 Nonrheumatic aortic (valve) stenosis: Secondary | ICD-10-CM | POA: Diagnosis not present

## 2022-09-29 DIAGNOSIS — R0602 Shortness of breath: Secondary | ICD-10-CM | POA: Diagnosis not present

## 2022-09-29 DIAGNOSIS — J449 Chronic obstructive pulmonary disease, unspecified: Secondary | ICD-10-CM | POA: Diagnosis not present

## 2022-09-29 DIAGNOSIS — I6522 Occlusion and stenosis of left carotid artery: Secondary | ICD-10-CM | POA: Diagnosis not present

## 2022-09-29 DIAGNOSIS — I10 Essential (primary) hypertension: Secondary | ICD-10-CM

## 2022-09-29 DIAGNOSIS — E785 Hyperlipidemia, unspecified: Secondary | ICD-10-CM | POA: Diagnosis not present

## 2022-09-29 DIAGNOSIS — R0683 Snoring: Secondary | ICD-10-CM | POA: Diagnosis not present

## 2022-09-29 DIAGNOSIS — R400 Somnolence: Secondary | ICD-10-CM | POA: Diagnosis not present

## 2022-09-29 NOTE — Progress Notes (Signed)
Cardiology Office Note:    Date:  09/29/2022   ID:  Brian Hunt, DOB 1946-08-30, MRN [id number]  PCP:  Janith Lima, MD   [facility] Providers Cardiologist:  None     Referring MD: Janith Lima, MD    History of Present Illness:    Brian Hunt is a 77 y.o. male with a hx of anemia, anxiety, arthritis, basal cell carcinoma, BPH, carotid artery occlusion, complication of anesthesia (vasal vagal reaction), COPD, diverticulosis, DOE, mild aortic stenosis, hyperlipidemia, hypertension, and RBBB, here for follow-up. He saw Dr. Ronnald Ramp 02/2021, and his blood pressure was 136/78. He reported that his blood pressure had been well controlled. Mild aortic stenosis was also noted and he was referred to cardiology. He previously had an echo 02/2018 with LVEF 47-42%, grade 1 diastolic dysfunction, and mild stenosis with a mean gradient of 13 mm HG.  He complained of bilateral LE pain. He had ABIs 06/2021 that revealed moderate disease bilaterally. Arterial dopplers showed 30-49% stenosis in the L common femoral and total occlusion of the L SFA. There was irregular plaque on the right that could not be quantified. Carotid dopplers showed moderate stenosis in the bilateral ICAs. Echo 06/2021 revealed LVEF 60-65% and mild aortic stenosis. He was referred to Dr. Gwenlyn Found and was started on Pletal 07/2021. They made plans to re-evaluate for angiography in 3 months. He stopped smoking 06/2021.   At his last visit his blood pressure continued to be labile, but it averaged in the 130s/70s and was controlled in the office. He continued exercising regularly. He continues to follow up with Dr. Gwenlyn Found for PAD. When he last saw him 04/2022 he was doing well and he had some mild claudication. They discussed peripheral angiography and he wanted to think about it.  Today, he states that it has been 6 weeks since his shoulder replacement. He recently followed up with his surgeon, and was told his shoulder is healing  well. His main concerns today are insomnia and anxiety. On average he is sleeping 4-5 hours a night, with nocturia about 6 times. Since his surgery, he has been having difficulty sleeping. He has been unable to sleep on his stomach which is his normal position. Occasionally snores if he is on his back. When he starts to drift off, he suddenly catches himself and is short of breath. Lately he is sleeping either in his recliner or on the cough. He hasn't slept in bed for 6 weeks. Of note, right after he eats most meals, he can sit in the chair and go to sleep. Also since his surgery, he complains of muscle twitches in his legs as well as BLE weakness which he attributes to his recent inactivity. However he is participating in physical therapy and wanting to return to his exercise routines. Currently he is no longer on a muscle relaxant. In clinic today his blood pressure is elevated at 156/78 which he largely attributes to feeling nervous today. Per his log, blood pressure is better controlled at home with averages of 595G-387F systolic. He denies any palpitations, chest pain, or peripheral edema. No lightheadedness, headaches, syncope.  Past Medical History:  Diagnosis Date   Anemia    Anxiety    Arthritis    Basal cell carcinoma    Basal Cell X2 , Dr Radford Pax   BPH (benign prostatic hyperplasia)    Bursitis of left hip    Dr. Ihor Gully   Carotid artery occlusion    Carotid stenosis 06/15/2021  Complication of anesthesia    vasovagal reaction after l9/7/12 left THA after he got to his room   COPD (chronic obstructive pulmonary disease) ([place])    Diverticulosis    Dyspnea    with exertion   Elevated PSA    Heart murmur    mild AI/AS 06/30/21 echo   Hyperlipidemia    Hypertension    Internal hemorrhoids    Leg pain 06/15/2021   PAD (peripheral artery disease) ([place]) 10/17/2021   Prostatitis    Dr Terance Hart   RBBB    Shortness of breath 09/29/2022    Past Surgical History:  Procedure Laterality  Date   COLONOSCOPY  2010   negative; [facility]   CYSTOSCOPY  10/2014   [facility]; neg   dislocation of right hip  2019   GREEN LIGHT LASER TURP (TRANSURETHRAL RESECTION OF PROSTATE  09/27/2012   Procedure: GREEN LIGHT LASER TURP (TRANSURETHRAL RESECTION OF PROSTATE;  Surgeon: Fredricka Bonine, MD;  Location: [facility];  Service: Urology;  Laterality: N/A;      JOINT REPLACEMENT Left Oct. 12, 2012   left total hip replacement by Dr. Mayer Camel   PROSTATE BIOPSY  2007 , 2009   X2 , Dr Thomasene Mohair  & Dr Terance Hart   PROSTATE SURGERY     Laser   TOTAL HIP ARTHROPLASTY Right 09/26/2017   TOTAL HIP ARTHROPLASTY Right 09/26/2017   Procedure: TOTAL HIP ARTHROPLASTY ANTERIOR APPROACH;  Surgeon: Frederik Pear, MD;  Location: [place];  Service: Orthopedics;  Laterality: Right;   TOTAL SHOULDER ARTHROPLASTY Right 08/17/2022   Procedure: TOTAL SHOULDER ARTHROPLASTY WITH AUGMENTED GLENOID;  Surgeon: Tania Ade, MD;  Location: [facility];  Service: Orthopedics;  Laterality: Right;    Current Medications: Current Meds  Medication Sig   amLODipine (NORVASC) 5 MG tablet TAKE 1 TABLET(5 MG) BY MOUTH DAILY   aspirin EC 81 MG tablet Take 81 mg by mouth daily.   cilostazol (PLETAL) 50 MG tablet Take 1 tablet (50 mg total) by mouth 2 (two) times daily. Please contact the office to schedule appointment for additional refills.   hydrocortisone cream 1 % Apply 1 Application topically daily as needed for itching.   rosuvastatin (CRESTOR) 5 MG tablet TAKE 1 TABLET EVERY DAY   telmisartan (MICARDIS) 80 MG tablet TAKE 1 TABLET(80 MG) BY MOUTH DAILY   tiZANidine (ZANAFLEX) 2 MG tablet Take 1 tablet (2 mg total) by mouth every 8 (eight) hours as needed for muscle spasms.   zinc gluconate 50 MG tablet Take 1 tablet (50 mg total) by mouth daily.     Allergies:   Demeclocycline, Pravastatin, Lisinopril, and Oxycodone   Social History   Socioeconomic History   Marital status: Married    Spouse name: Lovey Newcomer    Number of children: 2   Years of education: Not on file   Highest education level: Not on file  Occupational History   Occupation: retired  Tobacco Use   Smoking status: Former    Packs/day: 0.00    Years: 40.00    Total pack years: 0.00    Types: Cigarettes    Quit date: 06/14/2021    Years since quitting: 1.2   Smokeless tobacco: Never  Vaping Use   Vaping Use: Never used  Substance and Sexual Activity   Alcohol use: No    Comment: 2001- states no rehab   Drug use: No   Sexual activity: Yes  Other Topics Concern   Not on file  Social History Narrative  Not on file   Social Determinants of Health   Financial Resource Strain: Low Risk  (07/12/2022)   Overall Financial Resource Strain (CARDIA)    Difficulty of Paying Living Expenses: Not hard at all  Food Insecurity: No Food Insecurity (07/12/2022)   Hunger Vital Sign    Worried About Running Out of Food in the Last Year: Never true    Ran Out of Food in the Last Year: Never true  Transportation Needs: No Transportation Needs (07/12/2022)   PRAPARE - Hydrologist (Medical): No    Lack of Transportation (Non-Medical): No  Physical Activity: Sufficiently Active (07/12/2022)   Exercise Vital Sign    Days of Exercise per Week: 3 days    Minutes of Exercise per Session: 60 min  Stress: No Stress Concern Present (07/12/2022)   [place]    Feeling of Stress : Not at all  Social Connections: [place] (07/12/2022)   Social Connection and Isolation Panel [NHANES]    Frequency of Communication with Friends and Family: More than three times a week    Frequency of Social Gatherings with Friends and Family: Once a week    Attends Religious Services: More than 4 times per year    Active Member of [employer] or Organizations: Yes    Attends Music therapist: More than 4 times per year    Marital Status: Married      Family History: The patient's family history includes Arthritis in his mother; Bone cancer in his maternal grandmother; COPD in his father; Colon cancer (age of onset: 72) in his son; Heart attack (age of onset: 76) in his paternal grandmother; Heart disease in his mother; Hyperlipidemia in his mother; Hypertension in his mother; Stroke in his mother; Transient ischemic attack in his mother; Turner syndrome in his daughter; Vascular Disease in his mother. There is no history of Asthma, Diabetes, Pancreatic cancer, Esophageal cancer, Stomach cancer, Liver disease, or Rectal cancer.  ROS:   Please see the history of present illness.    (+) Insomnia (+) Anxiety (+) Nocturia (+) Shortness of breath (+) Orthopnea (+) BLE weakness and muscle twitches All other systems reviewed and are negative.  EKGs/Labs/Other Studies Reviewed:    The following studies were reviewed today:  Carotid Doppler  09/25/2022: Summary:  Right Carotid: Velocities in the right ICA are consistent with a 1-39%  stenosis.                Unable to obtain right arm pressure due to recent shoulder                 surgery.   Left Carotid: Velocities in the left ICA are consistent with a 40-59%  stenosis.   Vertebrals: Bilateral vertebral arteries demonstrate antegrade flow.  Subclavians: Normal flow hemodynamics were seen in bilateral subclavian               arteries.   Abdominal Aorta Study  08/03/2021: Summary:  Abdominal Aorta: The largest aortic measurement is 1.8 cm.  Stenosis:  Atherosclerosis in the aorta and iliac arteries with no focal stensosis.   Technically challenging due to flash artifact and bowel gas.   IVC/Iliac: There is no evidence of thrombus involving the IVC.   CTA Neck 07/22/21 1. Chronic atherosclerosis. Progressed chronic plaque at the Left ICA origin and bulb since 2012, with stenosis there now numerically estimated at 66%. Brachiocephalic artery origin stenosis estimated at  73%. But no other hemodynamically significant stenosis in the neck.   2. Aortic Atherosclerosis (ICD10-I70.0) and Emphysema (ICD10-J43.9).  Stress Test 07/06/21   The study is normal. The study is low risk.   RBBB with repolarization changes was present at baseline with no ST changes from baseline during infusion.  Occsaional PVCs were noted.   LV perfusion is normal. There is no evidence of ischemia. There is no evidence of infarction.   Left ventricular function is normal. End diastolic cavity size is normal. End systolic cavity size is normal.   Prior study not available for comparison.  Carotid Duplex 06/30/21 Right Carotid: Velocities in the right ICA are consistent with a  stable1-39% stenosis.   Left Carotid: Velocities in the left ICA are consistent with a 1-39%  stenosis, see note above.                If clinically indicated, consider alternate imaging.   Vertebrals:  Bilateral vertebral arteries demonstrate antegrade flow.  Subclavians: Normal flow hemodynamics were seen in bilateral subclavian arteries.   LE Duplex 06/30/21 Right: Heterogenous irregular calcific plaque throughout extremity. Areas  of shadowing plaque, can not rule out higher grade stenosis or occlusion  within. Waveform changes to monophasic proximal/mid SFA most likley  indicating stenosis more proximally.   Left: 30-49% stenosis noted in the common femoral artery. Total occlusion  noted in the mid superficial femoral artery. Heterogenous irregular  calcific plaque throughout extremity. Areas of shadowing plaque, can not  rule out higher grade stenosis or  occlusion within.   LE ABI 06/30/21 Right: Resting right ankle-brachial index indicates moderate right lower  extremity arterial disease. The right toe-brachial index is abnormal.   Left: Resting left ankle-brachial index indicates moderate left lower  extremity arterial disease. The left toe-brachial index is abnormal.   Echo 06/30/21  1.  Left ventricular ejection fraction, by estimation, is 60 to 65%. The  left ventricle has normal function. The left ventricle has no regional  wall motion abnormalities. There is mild concentric left ventricular  hypertrophy. The average left ventricular   global longitudinal strain is -18.0 %.   2. Right ventricular systolic function is normal. The right ventricular  size is normal.   3. Left atrial size was moderately dilated.   4. No evidence of mitral valve regurgitation. No evidence of mitral  stenosis. Moderate mitral annular calcification.   5. No pulmonary hypertension.   6. The aortic valve is normal in structure. Aortic valve regurgitation is  mild. Mild aortic valve stenosis.   7. The inferior vena cava is normal in size with greater than 50%  respiratory variability, suggesting right atrial pressure of 3 mmHg.   Comparison(s): EF 65%, mild AS peak gradient 27 mmHg mean gradient 13  mmHg.   CT Chest 04/12/2021: COMPARISON:  09/02/2019   FINDINGS: Cardiovascular: Heart size is normal. Advanced coronary artery calcification. Aortic atherosclerotic calcification. No aneurysmal dilatation. No pericardial fluid.   Mediastinum/Nodes: No mediastinal mass or lymphadenopathy. Chronic thyroid nodule on the right, maximal dimension 19 mm, unchanged since 20/12 and considered benign.   Lungs/Pleura: Emphysema, upper lobe predominant. No evidence of infiltrate or collapse. No pleural effusion. 4 mm pulmonary nodule in the right upper lobe axial image 50 remains unchanged. Few tiny perifissural nodules and subpleural nodules are unchanged. No new or enlarging findings.   Upper Abdomen: Negative   Musculoskeletal: Ordinary thoracic degenerative change.   IMPRESSION: Stable examination. Background emphysema. Stable small pulmonary nodules that do not require  further follow-up. See above for full discussion.   Aortic Atherosclerosis (ICD10-I70.0) and Emphysema  (ICD10-J43.9). Coronary artery calcification present extensively.   19 mm right thyroid nodule, unchanged since 20/12 and considered benign. No follow-up recommended unless clinically warranted.    TTE 03/07/2018: - Left ventricle: The cavity size was normal. There was mild focal    basal hypertrophy of the septum. Systolic function was vigorous.    The estimated ejection fraction was in the range of 65% to 70%.    Wall motion was normal; there were no regional wall motion    abnormalities. Doppler parameters are consistent with abnormal    left ventricular relaxation (grade 1 diastolic dysfunction).  - Aortic valve: Valve mobility was restricted. There was mild    stenosis. Peak velocity (S): 259 cm/s. Mean gradient (S): 13 mm    Hg.  - Mitral valve: Calcified annulus. Mildly thickened leaflets .  - Left atrium: The atrium was mildly dilated. Volume/bsa, ES,    (1-plane Simpson&'s, A2C): 36.8 ml/m^2.  - Tricuspid valve: There was mild regurgitation.  - Pulmonary arteries: Systolic pressure was mildly increased. PA    peak pressure: 37 mm Hg (S).    EKG:   EKG is personally reviewed. 09/29/2022:  EKG was not ordered. 06/15/2021: Sinus rhythm. Rate 60 bpm. PVCs. RBBB.  Recent Labs: 05/25/2022: ALT 14; TSH 2.55 08/01/2022: BUN 16; Creatinine, Ser 0.90; Hemoglobin 13.8; Platelets 298; Potassium 4.5; Sodium 131   Recent Lipid Panel    Component Value Date/Time   CHOL 116 05/25/2022 1057   CHOL 134 07/18/2021 0832   TRIG 52.0 05/25/2022 1057   HDL 52.40 05/25/2022 1057   HDL 61 07/18/2021 0832   CHOLHDL 2 05/25/2022 1057   VLDL 10.4 05/25/2022 1057   LDLCALC 54 05/25/2022 1057   LDLCALC 63 07/18/2021 0832     STOP-Bang Score:  6       Physical Exam:    Wt Readings from Last 3 Encounters:  09/29/22 167 lb 1.6 oz (75.8 kg)  08/17/22 162 lb 14.4 oz (73.9 kg)  08/01/22 162 lb 14.4 oz (73.9 kg)    VS:  BP (!) 156/78 (BP Location: Left Arm, Patient Position: Sitting, Cuff  Size: Normal)   Pulse 88   Ht '5\' 6"'$  (1.676 m)   Wt 167 lb 1.6 oz (75.8 kg)   SpO2 95%   BMI 26.97 kg/m  , BMI Body mass index is 26.97 kg/m. GENERAL:  Well appearing HEENT: Pupils equal round and reactive, fundi not visualized, oral mucosa unremarkable NECK:  No jugular venous distention, waveform within normal limits, carotid upstroke brisk and symmetric, R carotid bruit, no thyromegaly LUNGS:  Poor air movement.  otherwise Clear to auscultation bilaterally HEART:  RRR.  PMI not displaced or sustained,S1 and S2 within normal limits, no S3, no S4, no clicks, no rubs, III/VI mid-peaking systolic murmur at the LUSB ABD:  Flat, positive bowel sounds normal in frequency in pitch, no bruits, no rebound, no guarding, no midline pulsatile mass, no hepatomegaly, no splenomegaly EXT:  2 plus pulses throughout, no edema, no cyanosis no clubbing SKIN:  No rashes no nodules NEURO:  Cranial nerves II through XII grossly intact, motor grossly intact throughout PSYCH:  Cognitively intact, oriented to person place and time   ASSESSMENT:    1. Primary hypertension   2. Stenosis of left carotid artery   3. Hyperlipidemia with target LDL less than 70   4. Aortic stenosis, mild   5. Shortness of breath  6. Chronic obstructive pulmonary disease, unspecified COPD type ([place])   7. Daytime somnolence   8. Snoring     PLAN:    HTN (hypertension) BP is high in the office today but consistently well-controlled at home.   Continue amlodipine and telmisartan.  Continue to track blood pressure at home.  His goal is less than 130/80.  Carotid stenosis Stable, bilateral carotid stenosis.  Continue with annual imaging.  Continue with aspirin and statin.  Lipids are very well-controlled.  Hyperlipidemia with target LDL less than 70 Lipids well-controlled as above.  Continue rosuvastatin.  Aortic stenosis, mild He has a history of mild aortic stenosis.  It sounds more moderate on exam and he is also  complaining of shortness of breath.  Repeat echocardiogram.  Shortness of breath He reports shortness of breath since his surgery.  He only notes it when lying down and finds himself sometimes gasping right before he falls asleep.  He exercises and has no exertional shortness of breath.  No lower extremity edema.  Low likelihood of pulmonary embolism.  We will check a sleep study which will also give [place] information about overnight oxygen saturations.  COPD (chronic obstructive pulmonary disease) ([place]) He has a history of COPD and is reporting some shortness of breath.  He is not overtly wheezing but has poor air movement on exam.  Will refer to pulmonary for further evaluation.  Continue to abstain from smoking.   Disposition: FU with Emry Barbato C. Oval Linsey, MD, [facility] in 6 months  Medication Adjustments/Labs and Tests Ordered: Current medicines are reviewed at length with the patient today.  Concerns regarding medicines are outlined above.   Orders Placed This Encounter  Procedures   Ambulatory referral to Pulmonology   ECHOCARDIOGRAM COMPLETE   [facility] Sleep Study   No orders of the defined types were placed in this encounter.  Patient Instructions  Medication Instructions:  Your physician recommends that you continue on your current medications as directed. Please refer to the Current Medication list given to you today.   *If you need a refill on your cardiac medications before your next appointment, please call your pharmacy*  Lab Work: NONE  Testing/Procedures: Your physician has requested that you have an echocardiogram. Echocardiography is a painless test that uses sound waves to create images of your heart. It provides your doctor with information about the size and shape of your heart and how well your heart's chambers and valves are working. This procedure takes approximately one hour. There are no restrictions for this procedure. Please do NOT wear cologne, perfume, aftershave,  or lotions (deodorant is allowed). Please arrive 15 minutes prior to your appointment time.  [facility] HOME SLEEP STUDY   Follow-Up: At [facility], you and your health needs are our priority.  As part of our continuing mission to provide you with exceptional heart care, we have created designated Provider Care Teams.  These Care Teams include your primary Cardiologist (physician) and Advanced Practice Providers (APPs -  Physician Assistants and Nurse Practitioners) who all work together to provide you with the care you need, when you need it.  We recommend signing up for the patient portal called "MyChart".  Sign up information is provided on this After Visit Summary.  MyChart is used to connect with patients for Virtual Visits (Telemedicine).  Patients are able to view lab/test results, encounter notes, upcoming appointments, etc.  Non-urgent messages can be sent to your provider as well.   To learn more about what you  can do with MyChart, go to [email or web address].    Your next appointment:   6 month(s)  Provider:   Skeet Latch, MD   Other Instructions WatchPAT?  Is a FDA cleared portable home sleep study test that uses a watch and 3 points of contact to monitor 7 different channels, including your heart rate, oxygen saturations, body position, snoring, and chest motion.  The study is easy to use from the comfort of your own home and accurately detect sleep apnea.  Before bed, you attach the chest sensor, attached the sleep apnea bracelet to your nondominant hand, and attach the finger probe.  After the study, the raw data is downloaded from the watch and scored for apnea events.   For more information: [email or web address]  Patient Testing Instructions:  Do not put battery into the device until bedtime when you are ready to begin the test. Please call the support number if you need assistance after following the instructions below: 24 hour support line-  [phone] or [facility] support at [phone] (option 2)  Download the [employer] AmericanWatchPAT One" app through the google play store or App Store  Be sure to turn on or enable access to bluetooth in [place] on your smartphone/ device  Make sure no other bluetooth devices are on and within the vicinity of your smartphone/ device and WatchPAT watch during testing.  Make sure to leave your smart phone/ device plugged in and charging all night.  When ready for bed:  Follow the instructions step by step in the WatchPAT One App to activate the testing device. For additional instructions, including video instruction, visit the WatchPAT One video on Youtube. You can search for [place] One within Youtube (video is 4 minutes and 18 seconds) or enter: https://youtube/watch?v=BCce_vbiwxE Please note: You will be prompted to enter a Pin to connect via bluetooth when starting the test. The PIN will be assigned to you when you receive the test.  The device is disposable, but it recommended that you retain the device until you receive a call letting you know the study has been received and the results have been interpreted.  We will let you know if the study did not transmit to [place] properly after the test is completed. You do not need to call us to confirm the receipt of the test.  Please complete the test within 48 hours of receiving PIN.   Frequently Asked Questions:  What is Watch [place] one?  A single use fully disposable home sleep apnea testing device and will not need to be returned after completion.  What are the requirements to use WatchPAT one?  The be able to have a successful watchpat one sleep study, you should have your Watch pat one device, your smart phone, watch pat one app, your PIN number and Internet access What type of phone do I need?  You should have a smart phone that uses Android 5.1 and above or any Iphone with IOS 10 and above How can I download the WatchPAT one app?  Based on your device  type search for WatchPAT one app either in google play for android devices or APP store for Iphone's Where will I get my PIN for the study?  Your PIN will be provided by your physician's office. It is used for authentication and if you lose/forget your PIN, please reach out to your providers office.  I do not have Internet at home. Can I do WatchPAT one study?  WatchPAT One needs Internet connection throughout the night  to be able to transmit the sleep data. You can use your home/local internet or your cellular's data package. However, it is always recommended to use home/local Internet. It is estimated that between 20MB-30MB will be used with each study.However, the application will be looking for 80MB space in the phone to start the study.  What happens if I lose internet or bluetooth connection?  During the internet disconnection, your phone will not be able to transmit the sleep data. All the data, will be stored in your phone. As soon as the internet connection is back on, the phone will being sending the sleep data. During the bluetooth disconnection, WatchPAT one will not be able to to send the sleep data to your phone. Data will be kept in the [facility] one until two devices have bluetooth connection back on. As soon as the connection is back on, WatchPAT one will send the sleep data to the phone.  How long do I need to wear the WatchPAT one?  After you start the study, you should wear the device at least 6 hours.  How far should I keep my phone from the device?  During the night, your phone should be within 15 feet.  What happens if I leave the room for restroom or other reasons?  Leaving the room for any reason will not cause any problem. As soon as your get back to the room, both devices will reconnect and will continue to send the sleep data. Can I use my phone during the sleep study?  Yes, you can use your phone as usual during the study. But it is recommended to put your watchpat one on  when you are ready to go to bed.  How will I get my study results?  A soon as you completed your study, your sleep data will be sent to the provider. They will then share the results with you when they are ready.     I,Mathew Stumpf,acting as a Education administrator for Skeet Latch, MD.,have documented all relevant documentation on the behalf of Skeet Latch, MD,as directed by  Skeet Latch, MD while in the presence of Skeet Latch, MD.  I, [place] Oval Linsey, MD have reviewed all documentation for this visit.  The documentation of the exam, diagnosis, procedures, and orders on 09/29/2022 are all accurate and complete.  Signed, Skeet Latch, MD  09/29/2022 5:32 PM    [place]

## 2022-09-29 NOTE — Assessment & Plan Note (Signed)
He has a history of mild aortic stenosis.  It sounds more moderate on exam and he is also complaining of shortness of breath.  Repeat echocardiogram.

## 2022-09-29 NOTE — Assessment & Plan Note (Signed)
He has a history of COPD and is reporting some shortness of breath.  He is not overtly wheezing but has poor air movement on exam.  Will refer to pulmonary for further evaluation.  Continue to abstain from smoking.

## 2022-09-29 NOTE — Assessment & Plan Note (Signed)
Lipids well-controlled as above.  Continue rosuvastatin.

## 2022-09-29 NOTE — Patient Instructions (Addendum)
Medication Instructions:  Your physician recommends that you continue on your current medications as directed. Please refer to the Current Medication list given to you today.   *If you need a refill on your cardiac medications before your next appointment, please call your pharmacy*  Lab Work: NONE  Testing/Procedures: Your physician has requested that you have an echocardiogram. Echocardiography is a painless test that uses sound waves to create images of your heart. It provides your doctor with information about the size and shape of your heart and how well your heart's chambers and valves are working. This procedure takes approximately one hour. There are no restrictions for this procedure. Please do NOT wear cologne, perfume, aftershave, or lotions (deodorant is allowed). Please arrive 15 minutes prior to your appointment time.  [facility] HOME SLEEP STUDY   Follow-Up: At [facility], you and your health needs are our priority.  As part of our continuing mission to provide you with exceptional heart care, we have created designated Provider Care Teams.  These Care Teams include your primary Cardiologist (physician) and Advanced Practice Providers (APPs -  Physician Assistants and Nurse Practitioners) who all work together to provide you with the care you need, when you need it.  We recommend signing up for the patient portal called "MyChart".  Sign up information is provided on this After Visit Summary.  MyChart is used to connect with patients for Virtual Visits (Telemedicine).  Patients are able to view lab/test results, encounter notes, upcoming appointments, etc.  Non-urgent messages can be sent to your provider as well.   To learn more about what you can do with MyChart, go to [email or web address].    Your next appointment:   6 month(s)  Provider:   Skeet Latch, MD   Other Instructions WatchPAT?  Is a FDA cleared portable home sleep study test that uses a watch and  3 points of contact to monitor 7 different channels, including your heart rate, oxygen saturations, body position, snoring, and chest motion.  The study is easy to use from the comfort of your own home and accurately detect sleep apnea.  Before bed, you attach the chest sensor, attached the sleep apnea bracelet to your nondominant hand, and attach the finger probe.  After the study, the raw data is downloaded from the watch and scored for apnea events.   For more information: [email or web address]  Patient Testing Instructions:  Do not put battery into the device until bedtime when you are ready to begin the test. Please call the support number if you need assistance after following the instructions below: 24 hour support line- [phone] or ITAMAR support at [phone] (option 2)  Download the [employer] One" app through the google play store or App Store  Be sure to turn on or enable access to bluetooth in [place] on your smartphone/ device  Make sure no other bluetooth devices are on and within the vicinity of your smartphone/ device and WatchPAT watch during testing.  Make sure to leave your smart phone/ device plugged in and charging all night.  When ready for bed:  Follow the instructions step by step in the WatchPAT One App to activate the testing device. For additional instructions, including video instruction, visit the WatchPAT One video on Youtube. You can search for [place] One within Youtube (video is 4 minutes and 18 seconds) or enter: https://youtube/watch?[email or web address]_vbiwxE Please note: You will be prompted to enter a Pin to connect via bluetooth when starting the test. The PIN will be assigned  to you when you receive the test.  The device is disposable, but it recommended that you retain the device until you receive a call letting you know the study has been received and the results have been interpreted.  We will let you know if the study did not transmit to  [place] properly after the test is completed. You do not need to call us to confirm the receipt of the test.  Please complete the test within 48 hours of receiving PIN.   Frequently Asked Questions:  What is Watch [place] one?  A single use fully disposable home sleep apnea testing device and will not need to be returned after completion.  What are the requirements to use WatchPAT one?  The be able to have a successful watchpat one sleep study, you should have your Watch pat one device, your smart phone, watch pat one app, your PIN number and Internet access What type of phone do I need?  You should have a smart phone that uses Android 5.1 and above or any Iphone with IOS 10 and above How can I download the WatchPAT one app?  Based on your device type search for WatchPAT one app either in google play for android devices or APP store for Iphone's Where will I get my PIN for the study?  Your PIN will be provided by your physician's office. It is used for authentication and if you lose/forget your PIN, please reach out to your providers office.  I do not have Internet at home. Can I do WatchPAT one study?  WatchPAT One needs Internet connection throughout the night to be able to transmit the sleep data. You can use your home/local internet or your cellular's data package. However, it is always recommended to use home/local Internet. It is estimated that between 20MB-30MB will be used with each study.However, the application will be looking for 80MB space in the phone to start the study.  What happens if I lose internet or bluetooth connection?  During the internet disconnection, your phone will not be able to transmit the sleep data. All the data, will be stored in your phone. As soon as the internet connection is back on, the phone will being sending the sleep data. During the bluetooth disconnection, WatchPAT one will not be able to to send the sleep data to your phone. Data will be kept in the [facility] one  until two devices have bluetooth connection back on. As soon as the connection is back on, WatchPAT one will send the sleep data to the phone.  How long do I need to wear the WatchPAT one?  After you start the study, you should wear the device at least 6 hours.  How far should I keep my phone from the device?  During the night, your phone should be within 15 feet.  What happens if I leave the room for restroom or other reasons?  Leaving the room for any reason will not cause any problem. As soon as your get back to the room, both devices will reconnect and will continue to send the sleep data. Can I use my phone during the sleep study?  Yes, you can use your phone as usual during the study. But it is recommended to put your watchpat one on when you are ready to go to bed.  How will I get my study results?  A soon as you completed your study, your sleep data will be sent to the provider. They will then share  the results with you when they are ready.

## 2022-09-29 NOTE — Assessment & Plan Note (Signed)
He reports shortness of breath since his surgery.  He only notes it when lying down and finds himself sometimes gasping right before he falls asleep.  He exercises and has no exertional shortness of breath.  No lower extremity edema.  Low likelihood of pulmonary embolism.  We will check a sleep study which will also give [place] information about overnight oxygen saturations.

## 2022-09-29 NOTE — Assessment & Plan Note (Addendum)
BP is high in the office today but consistently well-controlled at home.   Continue amlodipine and telmisartan.  Continue to track blood pressure at home.  His goal is less than 130/80.

## 2022-09-29 NOTE — Assessment & Plan Note (Signed)
Stable, bilateral carotid stenosis.  Continue with annual imaging.  Continue with aspirin and statin.  Lipids are very well-controlled.

## 2022-10-04 ENCOUNTER — Telehealth: Payer: Self-pay | Admitting: *Deleted

## 2022-10-04 DIAGNOSIS — M25511 Pain in right shoulder: Secondary | ICD-10-CM | POA: Diagnosis not present

## 2022-10-04 DIAGNOSIS — R531 Weakness: Secondary | ICD-10-CM | POA: Diagnosis not present

## 2022-10-04 DIAGNOSIS — Z4789 Encounter for other orthopedic aftercare: Secondary | ICD-10-CM | POA: Diagnosis not present

## 2022-10-04 NOTE — Telephone Encounter (Addendum)
Staff message sent to Alvina Filbert ok to activate itamar.

## 2022-10-04 NOTE — Telephone Encounter (Signed)
Advised patient, verbalized understanding  

## 2022-10-06 ENCOUNTER — Ambulatory Visit (INDEPENDENT_AMBULATORY_CARE_PROVIDER_SITE_OTHER): Payer: Medicare PPO

## 2022-10-06 DIAGNOSIS — I350 Nonrheumatic aortic (valve) stenosis: Secondary | ICD-10-CM

## 2022-10-06 DIAGNOSIS — Z4789 Encounter for other orthopedic aftercare: Secondary | ICD-10-CM | POA: Diagnosis not present

## 2022-10-06 DIAGNOSIS — M25511 Pain in right shoulder: Secondary | ICD-10-CM | POA: Diagnosis not present

## 2022-10-06 DIAGNOSIS — R531 Weakness: Secondary | ICD-10-CM | POA: Diagnosis not present

## 2022-10-07 LAB — ECHOCARDIOGRAM COMPLETE
AR max vel: 1.72 cm2
AV Area VTI: 1.69 cm2
AV Area mean vel: 1.75 cm2
AV Mean grad: 35 mmHg
AV Peak grad: 51.6 mmHg
AV Vena cont: 0.25 cm
Ao pk vel: 3.59 m/s
Area-P 1/2: 1.84 cm2
Est EF: 70
MV VTI: 3.49 cm2
P 1/2 time: 563 msec
S' Lateral: 2.07 cm

## 2022-10-09 DIAGNOSIS — R351 Nocturia: Secondary | ICD-10-CM | POA: Diagnosis not present

## 2022-10-09 DIAGNOSIS — R972 Elevated prostate specific antigen [PSA]: Secondary | ICD-10-CM | POA: Diagnosis not present

## 2022-10-09 DIAGNOSIS — N5201 Erectile dysfunction due to arterial insufficiency: Secondary | ICD-10-CM | POA: Diagnosis not present

## 2022-10-09 DIAGNOSIS — N401 Enlarged prostate with lower urinary tract symptoms: Secondary | ICD-10-CM | POA: Diagnosis not present

## 2022-10-09 LAB — PSA: PSA: 3.2

## 2022-10-10 DIAGNOSIS — Z4789 Encounter for other orthopedic aftercare: Secondary | ICD-10-CM | POA: Diagnosis not present

## 2022-10-10 DIAGNOSIS — L814 Other melanin hyperpigmentation: Secondary | ICD-10-CM | POA: Diagnosis not present

## 2022-10-10 DIAGNOSIS — D225 Melanocytic nevi of trunk: Secondary | ICD-10-CM | POA: Diagnosis not present

## 2022-10-10 DIAGNOSIS — R531 Weakness: Secondary | ICD-10-CM | POA: Diagnosis not present

## 2022-10-10 DIAGNOSIS — M25511 Pain in right shoulder: Secondary | ICD-10-CM | POA: Diagnosis not present

## 2022-10-10 DIAGNOSIS — D2271 Melanocytic nevi of right lower limb, including hip: Secondary | ICD-10-CM | POA: Diagnosis not present

## 2022-10-10 DIAGNOSIS — L720 Epidermal cyst: Secondary | ICD-10-CM | POA: Diagnosis not present

## 2022-10-10 DIAGNOSIS — L570 Actinic keratosis: Secondary | ICD-10-CM | POA: Diagnosis not present

## 2022-10-10 DIAGNOSIS — L821 Other seborrheic keratosis: Secondary | ICD-10-CM | POA: Diagnosis not present

## 2022-10-10 DIAGNOSIS — Z85828 Personal history of other malignant neoplasm of skin: Secondary | ICD-10-CM | POA: Diagnosis not present

## 2022-10-11 ENCOUNTER — Encounter (HOSPITAL_BASED_OUTPATIENT_CLINIC_OR_DEPARTMENT_OTHER): Payer: Self-pay | Admitting: Cardiovascular Disease

## 2022-10-11 NOTE — Telephone Encounter (Signed)
Echo with moderate to severe aortic stenosis. Anticipate Dr. Oval Linsey will want to review echo images. Will defer to her as to next steps. If she recommends TEE - happy to see him in clinic to discuss if needed and get consent.   Loel Dubonnet, NP

## 2022-10-12 DIAGNOSIS — R531 Weakness: Secondary | ICD-10-CM | POA: Diagnosis not present

## 2022-10-12 DIAGNOSIS — M25511 Pain in right shoulder: Secondary | ICD-10-CM | POA: Diagnosis not present

## 2022-10-12 DIAGNOSIS — Z4789 Encounter for other orthopedic aftercare: Secondary | ICD-10-CM | POA: Diagnosis not present

## 2022-10-16 ENCOUNTER — Other Ambulatory Visit: Payer: Self-pay | Admitting: Cardiovascular Disease

## 2022-10-16 ENCOUNTER — Encounter: Payer: Self-pay | Admitting: Internal Medicine

## 2022-10-16 DIAGNOSIS — Z4789 Encounter for other orthopedic aftercare: Secondary | ICD-10-CM | POA: Diagnosis not present

## 2022-10-16 DIAGNOSIS — M25511 Pain in right shoulder: Secondary | ICD-10-CM | POA: Diagnosis not present

## 2022-10-16 DIAGNOSIS — R531 Weakness: Secondary | ICD-10-CM | POA: Diagnosis not present

## 2022-10-18 DIAGNOSIS — R531 Weakness: Secondary | ICD-10-CM | POA: Diagnosis not present

## 2022-10-18 DIAGNOSIS — Z4789 Encounter for other orthopedic aftercare: Secondary | ICD-10-CM | POA: Diagnosis not present

## 2022-10-18 DIAGNOSIS — M25511 Pain in right shoulder: Secondary | ICD-10-CM | POA: Diagnosis not present

## 2022-10-20 DIAGNOSIS — Z4789 Encounter for other orthopedic aftercare: Secondary | ICD-10-CM | POA: Diagnosis not present

## 2022-10-20 DIAGNOSIS — M25511 Pain in right shoulder: Secondary | ICD-10-CM | POA: Diagnosis not present

## 2022-10-20 DIAGNOSIS — R531 Weakness: Secondary | ICD-10-CM | POA: Diagnosis not present

## 2022-10-23 DIAGNOSIS — R531 Weakness: Secondary | ICD-10-CM | POA: Diagnosis not present

## 2022-10-23 DIAGNOSIS — M25511 Pain in right shoulder: Secondary | ICD-10-CM | POA: Diagnosis not present

## 2022-10-23 DIAGNOSIS — Z4789 Encounter for other orthopedic aftercare: Secondary | ICD-10-CM | POA: Diagnosis not present

## 2022-10-24 ENCOUNTER — Ambulatory Visit: Payer: Medicare PPO | Admitting: Internal Medicine

## 2022-10-24 ENCOUNTER — Encounter: Payer: Self-pay | Admitting: Internal Medicine

## 2022-10-24 VITALS — BP 132/54 | HR 99 | Temp 97.4°F | Resp 16 | Ht 66.0 in | Wt 167.0 lb

## 2022-10-24 DIAGNOSIS — E538 Deficiency of other specified B group vitamins: Secondary | ICD-10-CM | POA: Diagnosis not present

## 2022-10-24 DIAGNOSIS — N182 Chronic kidney disease, stage 2 (mild): Secondary | ICD-10-CM

## 2022-10-24 DIAGNOSIS — I10 Essential (primary) hypertension: Secondary | ICD-10-CM | POA: Diagnosis not present

## 2022-10-24 DIAGNOSIS — D538 Other specified nutritional anemias: Secondary | ICD-10-CM

## 2022-10-24 DIAGNOSIS — F5104 Psychophysiologic insomnia: Secondary | ICD-10-CM

## 2022-10-24 DIAGNOSIS — R739 Hyperglycemia, unspecified: Secondary | ICD-10-CM

## 2022-10-24 LAB — BASIC METABOLIC PANEL
BUN: 15 mg/dL (ref 6–23)
CO2: 24 mEq/L (ref 19–32)
Calcium: 9.4 mg/dL (ref 8.4–10.5)
Chloride: 96 mEq/L (ref 96–112)
Creatinine, Ser: 0.87 mg/dL (ref 0.40–1.50)
GFR: 83.92 mL/min (ref >60.00)
Glucose, Bld: 140 mg/dL — ABNORMAL HIGH (ref 70–99)
Potassium: 4.2 mEq/L (ref 3.5–5.1)
Sodium: 132 mEq/L — ABNORMAL LOW (ref 135–145)

## 2022-10-24 LAB — CBC WITH DIFFERENTIAL/PLATELET
Basophils Absolute: 0.1 10*3/uL (ref 0.0–0.1)
Basophils Relative: 1.2 % (ref 0.0–3.0)
Eosinophils Absolute: 0.2 10*3/uL (ref 0.0–0.7)
Eosinophils Relative: 2.8 % (ref 0.0–5.0)
HCT: 39.7 % (ref 39.0–52.0)
Hemoglobin: 13.6 g/dL (ref 13.0–17.0)
Lymphocytes Relative: 17.2 % (ref 12.0–46.0)
Lymphs Abs: 1 10*3/uL (ref 0.7–4.0)
MCHC: 34.1 g/dL (ref 30.0–36.0)
MCV: 96.2 fl (ref 78.0–100.0)
Monocytes Absolute: 0.9 10*3/uL (ref 0.1–1.0)
Monocytes Relative: 14.4 % — ABNORMAL HIGH (ref 3.0–12.0)
Neutro Abs: 3.8 10*3/uL (ref 1.4–7.7)
Neutrophils Relative %: 64.4 % (ref 43.0–77.0)
Platelets: 342 10*3/uL (ref 150.0–400.0)
RBC: 4.13 Mil/uL — ABNORMAL LOW (ref 4.22–5.81)
RDW: 14.1 % (ref 11.5–15.5)
WBC: 5.9 10*3/uL (ref 4.0–10.5)

## 2022-10-24 LAB — HEMOGLOBIN A1C: Hgb A1c MFr Bld: 5.7 % (ref 4.6–6.5)

## 2022-10-24 NOTE — Progress Notes (Signed)
Subjective:  Patient ID: Brian Hunt, male    DOB: 02/08/1946  Age: 77 y.o. MRN: [id number]  CC: Hypertension   HPI TORAINO SCOPEL presents for f/up -  He is active and denies chest pain, diaphoresis, fatigue, or edema.  Outpatient Medications Prior to Visit  Medication Sig Dispense Refill   amLODipine (NORVASC) 5 MG tablet TAKE 1 TABLET(5 MG) BY MOUTH DAILY 90 tablet 1   aspirin EC 81 MG tablet Take 81 mg by mouth daily.     cilostazol (PLETAL) 50 MG tablet Take 1 tablet (50 mg total) by mouth 2 (two) times daily. 180 tablet 0   hydrocortisone cream 1 % Apply 1 Application topically daily as needed for itching.     rosuvastatin (CRESTOR) 5 MG tablet TAKE 1 TABLET EVERY DAY 90 tablet 1   tamsulosin (FLOMAX) 0.4 MG CAPS capsule Take 0.4 mg by mouth at bedtime.     telmisartan (MICARDIS) 80 MG tablet TAKE 1 TABLET(80 MG) BY MOUTH DAILY 90 tablet 3   tiZANidine (ZANAFLEX) 2 MG tablet Take 1 tablet (2 mg total) by mouth every 8 (eight) hours as needed for muscle spasms. 30 tablet 0   zinc gluconate 50 MG tablet Take 1 tablet (50 mg total) by mouth daily. 90 tablet 1   No facility-administered medications prior to visit.    ROS Review of Systems  Constitutional:  Negative for chills, diaphoresis, fatigue and fever.  HENT: Negative.  Negative for trouble swallowing.   Eyes: Negative.   Respiratory:  Positive for cough and shortness of breath. Negative for chest tightness and wheezing.   Cardiovascular:  Negative for chest pain, palpitations and leg swelling.  Gastrointestinal:  Negative for abdominal pain, constipation, diarrhea, nausea and vomiting.  Endocrine: Negative.   Genitourinary: Negative.  Negative for difficulty urinating.  Musculoskeletal:  Positive for arthralgias.  Skin: Negative.   Neurological:  Positive for dizziness. Negative for weakness and light-headedness.  Hematological:  Negative for adenopathy. Does not bruise/bleed easily.  Psychiatric/Behavioral:   Positive for sleep disturbance. Negative for confusion, decreased concentration, dysphoric mood and suicidal ideas. The patient is nervous/anxious.     Objective:  BP (!) 132/54 (BP Location: Left Arm, Patient Position: Sitting, Cuff Size: Normal)   Pulse 99   Temp (!) 97.4 F (36.3 C) (Oral)   Resp 16   Ht 5' 6"$  (1.676 m)   Wt 167 lb (75.8 kg)   SpO2 94%   BMI 26.95 kg/m   BP Readings from Last 3 Encounters:  10/26/22 (!) 142/78  10/24/22 (!) 132/54  09/29/22 (!) 156/78    Wt Readings from Last 3 Encounters:  10/26/22 166 lb 12.8 oz (75.7 kg)  10/24/22 167 lb (75.8 kg)  09/29/22 167 lb 1.6 oz (75.8 kg)    Physical Exam Vitals reviewed.  HENT:     Nose: Nose normal.     Mouth/Throat:     Mouth: Mucous membranes are moist.  Eyes:     General: No scleral icterus.    Conjunctiva/sclera: Conjunctivae normal.  Cardiovascular:     Rate and Rhythm: Normal rate. Frequent Extrasystoles are present.    Heart sounds: Murmur heard.     Systolic murmur is present with a grade of 4/6.     No friction rub. No gallop.  Pulmonary:     Effort: Pulmonary effort is normal.     Breath sounds: No stridor. No wheezing, rhonchi or rales.  Abdominal:     General: Abdomen is flat.  Palpations: There is no mass.     Tenderness: There is no abdominal tenderness. There is no guarding.     Hernia: No hernia is present.  Musculoskeletal:        General: Normal range of motion.     Cervical back: Neck supple.     Right lower leg: No edema.     Left lower leg: No edema.  Lymphadenopathy:     Cervical: No cervical adenopathy.  Skin:    General: Skin is warm and dry.  Neurological:     General: No focal deficit present.     Mental Status: He is alert.  Psychiatric:        Mood and Affect: Mood normal.        Behavior: Behavior normal.     Lab Results  Component Value Date   WBC 5.9 10/24/2022   HGB 13.6 10/24/2022   HCT 39.7 10/24/2022   PLT 342.0 10/24/2022   GLUCOSE 140 (H)  10/24/2022   CHOL 116 05/25/2022   TRIG 52.0 05/25/2022   HDL 52.40 05/25/2022   LDLCALC 54 05/25/2022   ALT 14 05/25/2022   AST 17 05/25/2022   NA 132 (L) 10/24/2022   K 4.2 10/24/2022   CL 96 10/24/2022   CREATININE 0.87 10/24/2022   BUN 15 10/24/2022   CO2 24 10/24/2022   TSH 2.55 05/25/2022   PSA 3.2 10/09/2022   INR 1.10 09/24/2017   HGBA1C 5.7 10/24/2022    ECHOCARDIOGRAM COMPLETE  Result Date: 10/07/2022    ECHOCARDIOGRAM REPORT   Patient Name:   Brian Hunt Date of Exam: 10/06/2022 Medical Rec #:  [id number]      Height:       66.0 in Accession #:    [id number]     Weight:       167.1 lb Date of Birth:  [birth year]      BSA:          1.853 m Patient Age:    53 years       BP:           110/65 mmHg Patient Gender: M              HR:           71 bpm. 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 PAD and COPD, Aortic Valve Disease and Mitral                 Valve Disease, Arrythmias:RBBB, Signs/Symptoms:Dyspnea; Risk                 Factors:Hypertension, Dyslipidemia and Former Smoker. Patient                 denies chest pain and leg edema. He has DOE.  Sonographer:    Salvadore Dom RVT, RDCS (AE), RDMS Referring Phys: [id number] TIFFANY [place] IMPRESSIONS  1. Calcifications extend from aortic valve to anterior mitral leaflet along aorto-mitral continuity. The aortic valve is abnormal. There is severe calcifcation of the aortic valve. Aortic valve regurgitation is mild. Moderate to  severe aortic valve stenosis. Aortic valve mean gradient measures 35.0 mmHg. Aortic valve Vmax measures 3.59 m/s.  2. Left ventricular ejection fraction, by estimation, is 70%. The left ventricle has hyperdynamic function. The left  ventricle has no regional wall motion abnormalities. There is severe left ventricular hypertrophy. Left ventricular diastolic parameters  are indeterminate.  3. Left ventircular midcavitary instantaneous gradient of 23 mmHg with Valsalva.  4. Right ventricular systolic function is normal. The right ventricular size is mildly enlarged. Tricuspid regurgitation signal is inadequate for assessing PA pressure.  5. Left atrial size was severely dilated.  6. The mitral valve is degenerative. Trivial mitral valve regurgitation. No evidence of mitral stenosis.  7. There is borderline dilatation of the aortic root, measuring 40 mm.  8. The inferior vena cava is normal in size with greater than 50% respiratory variability, suggesting right atrial pressure of 3 mmHg. FINDINGS  Left Ventricle: Left ventricular ejection fraction, by estimation, is 70%. The left ventricle has hyperdynamic function. The left ventricle has no regional wall motion abnormalities. Global longitudinal strain performed but not reported based on interpreter judgement due to suboptimal tracking. 3D left ventricular ejection fraction analysis performed but not reported based on interpreter judgement due to suboptimal tracking. The left ventricular internal cavity size was normal in size. There is severe left ventricular hypertrophy. Left ventricular diastolic parameters are indeterminate. Right Ventricle: The right ventricular size is mildly enlarged. No increase in right ventricular wall thickness. Right ventricular systolic function is normal. Tricuspid regurgitation signal is inadequate for assessing PA pressure. The tricuspid regurgitant velocity is 1.76 m/s, and with an assumed right atrial pressure of 3 mmHg, the estimated right ventricular systolic pressure is 123XX123 mmHg. Left Atrium: Left atrial size was severely dilated. Right Atrium: Right atrial size was normal in size. Pericardium: There is no evidence of pericardial effusion. Mitral Valve: The  mitral valve is degenerative in appearance. There is moderate calcification of the mitral valve leaflet(s). Mild mitral annular calcification. Trivial mitral valve regurgitation. No evidence of mitral valve stenosis. MV peak gradient, 11.4 mmHg. The mean mitral valve gradient is 3.0 mmHg with average heart rate of 67 bpm. Tricuspid Valve: The tricuspid valve is normal in structure. Tricuspid valve regurgitation is mild . No evidence of tricuspid stenosis. Aortic Valve: Calcifications extend from aortic valve to anterior mitral leaflet along aorto-mitral continuity. The aortic valve is abnormal. There is severe calcifcation of the aortic valve. Aortic valve regurgitation is mild. Aortic regurgitation PHT measures 563 msec. Moderate to severe aortic stenosis is present. Aortic valve mean gradient measures 35.0 mmHg. Aortic valve peak gradient measures 51.6 mmHg. Aortic valve area, by VTI measures 1.69 cm. Pulmonic Valve: The pulmonic valve was normal in structure. Pulmonic valve regurgitation is trivial. No evidence of pulmonic stenosis. Aorta: The ascending aorta was not well visualized. There is borderline dilatation of the aortic root, measuring 40 mm. Venous: The inferior vena cava is normal in size with greater than 50% respiratory variability, suggesting right atrial pressure of 3 mmHg. IAS/Shunts: No atrial level shunt detected by color flow Doppler.  LEFT VENTRICLE PLAX 2D LVIDd:         4.33 cm   Diastology LVIDs:         2.07 cm   LV e' medial:    4.20 cm/s LV PW:         1.54 cm   LV E/e' medial:  25.7 LV IVS:        1.54 cm   LV e'  lateral:   3.98 cm/s LVOT diam:     2.55 cm   LV E/e' lateral: 27.1 LV SV:         137 LV SV Index:   74 LVOT Area:     5.11 cm                           3D Volume EF:                          3D EF:        60 %                          LV EDV:       113 ml                          LV ESV:       45 ml                          LV SV:        68 ml RIGHT VENTRICLE RV S prime:      15.60 cm/s TAPSE (M-mode): 3.4 cm LEFT ATRIUM              Index        RIGHT ATRIUM           Index LA diam:        5.10 cm  2.75 cm/m   RA Area:     18.30 cm LA Vol (A2C):   104.0 ml 56.13 ml/m  RA Volume:   34.80 ml  18.78 ml/m LA Vol (A4C):   89.7 ml  48.41 ml/m LA Biplane Vol: 98.5 ml  53.16 ml/m  AORTIC VALVE                     PULMONIC VALVE AV Area (Vmax):    1.72 cm      PV Vmax:       0.89 m/s AV Area (Vmean):   1.75 cm      PV Peak grad:  3.2 mmHg AV Area (VTI):     1.69 cm AV Vmax:           359.00 cm/s AV Vmean:          235.000 cm/s AV VTI:            0.810 m AV Peak Grad:      51.6 mmHg AV Mean Grad:      35.0 mmHg LVOT Vmax:         121.00 cm/s LVOT Vmean:        80.500 cm/s LVOT VTI:          0.268 m LVOT/AV VTI ratio: 0.33 AI PHT:            563 msec AR Vena Contracta: 0.25 cm  AORTA Ao Root diam: 4.00 cm Ao Asc diam:  3.50 cm Ao Arch diam: 3.1 cm MITRAL VALVE                TRICUSPID VALVE MV Area (PHT): 1.84 cm     TR Peak grad:   12.4 mmHg MV Area VTI:   3.49 cm     TR Vmax:        176.00 cm/s  MV Peak grad:  11.4 mmHg MV Mean grad:  3.0 mmHg     SHUNTS MV Vmax:       1.68 m/s     Systemic VTI:  0.27 m MV Vmean:      79.6 cm/s    Systemic Diam: 2.55 cm MV Decel Time: 413 msec MV E velocity: 108.00 cm/s MV A velocity: 155.00 cm/s MV E/A ratio:  0.70 Cherlynn Kaiser MD Electronically signed by Cherlynn Kaiser MD Signature Date/Time: 10/07/2022/3:17:27 PM    Final    VAS US CAROTID  Result Date: 09/26/2022 Carotid Arterial Duplex Study Patient Name:  ESCO OSHMAN  Date of Exam:   09/25/2022 Medical Rec #: [id number]       Accession #:    [id number] Date of Birth: Nov 17, 1945       Patient Gender: M Patient Age:   70 years Exam Location:  [facility] Procedure:      VAS US CAROTID Referring Phys: TIFFANY [place] --------------------------------------------------------------------------------  Indications:       Carotid artery disease and patient denies any cerebrovascular                     symptoms. Risk Factors:      Hypertension, hyperlipidemia, past history of smoking, PAD. Other Factors:     Occlusion and stenosis of carotid artery without mention of                    cerebral infarction. Comparison Study:  06/30/2021 prior carotid duplex showed highest velocities in                    right mid                    ICA 111/37cm/s and left proximal ICA 168/37 cm/s. Performing Technologist: Leavy Cella RDCS  Examination Guidelines: A complete evaluation includes B-mode imaging, spectral Doppler, color Doppler, and power Doppler as needed of all accessible portions of each vessel. Bilateral testing is considered an integral part of a complete examination. Limited examinations for reoccurring indications may be performed as noted.  Right Carotid Findings: +----------+--------+--------+--------+------------------+--------+           PSV cm/sEDV cm/sStenosisPlaque DescriptionComments +----------+--------+--------+--------+------------------+--------+ CCA Prox  82      16                                         +----------+--------+--------+--------+------------------+--------+ CCA Mid   85      18                                         +----------+--------+--------+--------+------------------+--------+ CCA Distal56      14                                         +----------+--------+--------+--------+------------------+--------+ ICA Prox  124     29      1-39%   heterogenous               +----------+--------+--------+--------+------------------+--------+ ICA Mid   80      27                                         +----------+--------+--------+--------+------------------+--------+  ICA Distal67      28                                         +----------+--------+--------+--------+------------------+--------+ ECA       107     11              heterogenous               +----------+--------+--------+--------+------------------+--------+  +----------+--------+-------+----------------+-------------------+           PSV cm/sEDV cmsDescribe        Arm Pressure (mmHG) +----------+--------+-------+----------------+-------------------+ [id number]     15     Multiphasic, WNL                    +----------+--------+-------+----------------+-------------------+ +---------+--------+--+--------+--+---------+ VertebralPSV cm/s33EDV cm/s12Antegrade +---------+--------+--+--------+--+---------+ Unable to obtain right arm pressure due to recent shoulder surgery. Left Carotid Findings: +----------+--------+--------+--------+------------------+---------------------+           PSV cm/sEDV cm/sStenosisPlaque DescriptionComments              +----------+--------+--------+--------+------------------+---------------------+ CCA Prox  100     22                                                      +----------+--------+--------+--------+------------------+---------------------+ CCA Mid   95      26                                                      +----------+--------+--------+--------+------------------+---------------------+ CCA Distal72      18                                                      +----------+--------+--------+--------+------------------+---------------------+ ICA Prox  182     46      40-59%  calcific          disturbed flow post                                                       shadowing plaque, can                                                     not rule out hihgher                                                      grade stenosis or  occlusion within                                                          plaque                +----------+--------+--------+--------+------------------+---------------------+ ICA Mid   113     21                                                       +----------+--------+--------+--------+------------------+---------------------+ ICA Distal71      16                                                      +----------+--------+--------+--------+------------------+---------------------+ ECA       145     20              heterogenous                            +----------+--------+--------+--------+------------------+---------------------+ +----------+--------+--------+----------------+-------------------+           PSV cm/sEDV cm/sDescribe        Arm Pressure (mmHG) +----------+--------+--------+----------------+-------------------+ Subclavian182     4       Multiphasic, WNL110                 +----------+--------+--------+----------------+-------------------+ +---------+--------+--+--------+--+---------+ VertebralPSV cm/s57EDV cm/s23Antegrade +---------+--------+--+--------+--+---------+   Summary: Right Carotid: Velocities in the right ICA are consistent with a 1-39% stenosis.                Unable to obtain right arm pressure due to recent shoulder                surgery. Left Carotid: Velocities in the left ICA are consistent with a 40-59% stenosis. Vertebrals:  Bilateral vertebral arteries demonstrate antegrade flow. Subclavians: Normal flow hemodynamics were seen in bilateral subclavian              arteries. *See table(s) above for measurements and observations. Suggest follow up study in 12 months. Electronically signed by Larae Grooms MD on 09/26/2022 at 1:55:07 PM.    Final    DG Chest 2 View  Result Date: 08/02/2022 CLINICAL DATA:  Preop right shoulder replacement EXAM: CHEST - 2 VIEW COMPARISON:  09/24/2017 FINDINGS: The heart size and mediastinal contours are within normal limits. Aortic atherosclerosis. Both lungs are clear. Mild scoliosis. IMPRESSION: No active cardiopulmonary disease. Electronically Signed   By: Donavan Foil M.D.   On: 08/02/2022 20:21     Assessment & Plan:   Amandus was seen today for  hypertension.  Diagnoses and all orders for this visit:  Primary hypertension- His blood pressure is well-controlled. -     Basic metabolic panel; Future -     Basic metabolic panel  123456 deficiency- H&H are normal now -     CBC with Differential/Platelet; Future -     CBC with Differential/Platelet  Anemia due to zinc deficiency- H&H are normal now -  CBC with Differential/Platelet; Future -     CBC with Differential/Platelet  Chronic hyperglycemia- A1c is normal at 5.7%. -     Hemoglobin A1c; Future -     Hemoglobin A1c  Psychophysiological insomnia -     Suvorexant (BELSOMRA) 15 MG TABS; Take 1 tablet (15 mg total) by mouth at bedtime as needed.  Chronic renal disease, stage 2, mildly decreased glomerular filtration rate (GFR) between 60-89 mL/min/1.73 square meter- Will avoid nephrotoxic agents.   I am having [name] start on [place]. I am also having him maintain his aspirin EC, telmisartan, rosuvastatin, zinc gluconate, hydrocortisone cream, amLODipine, tiZANidine, cilostazol, and tamsulosin.  Meds ordered this encounter  Medications   Suvorexant (BELSOMRA) 15 MG TABS    Sig: Take 1 tablet (15 mg total) by mouth at bedtime as needed.    Dispense:  90 tablet    Refill:  1     Follow-up: Return in about 4 months (around 02/22/2023).  Scarlette Calico, MD

## 2022-10-24 NOTE — Patient Instructions (Signed)
Hypertension, Adult High blood pressure (hypertension) is when the force of blood pumping through the arteries is too strong. The arteries are the blood vessels that carry blood from the heart throughout the body. Hypertension forces the heart to work harder to pump blood and may cause arteries to become narrow or stiff. Untreated or uncontrolled hypertension can lead to a heart attack, heart failure, a stroke, kidney disease, and other problems. A blood pressure reading consists of a higher number over a lower number. Ideally, your blood pressure should be below 120/80. The first ("top") number is called the systolic pressure. It is a measure of the pressure in your arteries as your heart beats. The second ("bottom") number is called the diastolic pressure. It is a measure of the pressure in your arteries as the heart relaxes. What are the causes? The exact cause of this condition is not known. There are some conditions that result in high blood pressure. What increases the risk? Certain factors may make you more likely to develop high blood pressure. Some of these risk factors are under your control, including: Smoking. Not getting enough exercise or physical activity. Being overweight. Having too much fat, sugar, calories, or salt (sodium) in your diet. Drinking too much alcohol. Other risk factors include: Having a personal history of heart disease, diabetes, high cholesterol, or kidney disease. Stress. Having a family history of high blood pressure and high cholesterol. Having obstructive sleep apnea. Age. The risk increases with age. What are the signs or symptoms? High blood pressure may not cause symptoms. Very high blood pressure (hypertensive crisis) may cause: Headache. Fast or irregular heartbeats (palpitations). Shortness of breath. Nosebleed. Nausea and vomiting. Vision changes. Severe chest pain, dizziness, and seizures. How is this diagnosed? This condition is diagnosed by  measuring your blood pressure while you are seated, with your arm resting on a flat surface, your legs uncrossed, and your feet flat on the floor. The cuff of the blood pressure monitor will be placed directly against the skin of your upper arm at the level of your heart. Blood pressure should be measured at least twice using the same arm. Certain conditions can cause a difference in blood pressure between your right and left arms. If you have a high blood pressure reading during one visit or you have normal blood pressure with other risk factors, you may be asked to: Return on a different day to have your blood pressure checked again. Monitor your blood pressure at home for 1 week or longer. If you are diagnosed with hypertension, you may have other blood or imaging tests to help your health care provider understand your overall risk for other conditions. How is this treated? This condition is treated by making healthy lifestyle changes, such as eating healthy foods, exercising more, and reducing your alcohol intake. You may be referred for counseling on a healthy diet and physical activity. Your health care provider may prescribe medicine if lifestyle changes are not enough to get your blood pressure under control and if: Your systolic blood pressure is above 130. Your diastolic blood pressure is above 80. Your personal target blood pressure may vary depending on your medical conditions, your age, and other factors. Follow these instructions at home: Eating and drinking  Eat a diet that is high in fiber and potassium, and low in sodium, added sugar, and fat. An example of this eating plan is called the DASH diet. DASH stands for Dietary Approaches to Stop Hypertension. To eat this way: Eat   plenty of fresh fruits and vegetables. Try to fill one half of your plate at each meal with fruits and vegetables. Eat whole grains, such as whole-wheat pasta, brown rice, or whole-grain bread. Fill about one  fourth of your plate with whole grains. Eat or drink low-fat dairy products, such as skim milk or low-fat yogurt. Avoid fatty cuts of meat, processed or cured meats, and poultry with skin. Fill about one fourth of your plate with lean proteins, such as fish, chicken without skin, beans, eggs, or tofu. Avoid pre-made and processed foods. These tend to be higher in sodium, added sugar, and fat. Reduce your daily sodium intake. Many people with hypertension should eat less than 1,500 mg of sodium a day. Do not drink alcohol if: Your health care provider tells you not to drink. You are pregnant, may be pregnant, or are planning to become pregnant. If you drink alcohol: Limit how much you have to: 0-1 drink a day for women. 0-2 drinks a day for men. Know how much alcohol is in your drink. In the [place] one drink equals one 12 oz bottle of beer (355 mL), one 5 oz glass of wine (148 mL), or one 1 oz glass of hard liquor (44 mL). Lifestyle  Work with your health care provider to maintain a healthy body weight or to lose weight. Ask what an ideal weight is for you. Get at least 30 minutes of exercise that causes your heart to beat faster (aerobic exercise) most days of the week. Activities may include walking, swimming, or biking. Include exercise to strengthen your muscles (resistance exercise), such as Pilates or lifting weights, as part of your weekly exercise routine. Try to do these types of exercises for 30 minutes at least 3 days a week. Do not use any products that contain nicotine or tobacco. These products include cigarettes, chewing tobacco, and vaping devices, such as e-cigarettes. If you need help quitting, ask your health care provider. Monitor your blood pressure at home as told by your health care provider. Keep all follow-up visits. This is important. Medicines Take over-the-counter and prescription medicines only as told by your health care provider. Follow directions carefully. Blood  pressure medicines must be taken as prescribed. Do not skip doses of blood pressure medicine. Doing this puts you at risk for problems and can make the medicine less effective. Ask your health care provider about side effects or reactions to medicines that you should watch for. Contact a health care provider if you: Think you are having a reaction to a medicine you are taking. Have headaches that keep coming back (recurring). Feel dizzy. Have swelling in your ankles. Have trouble with your vision. Get help right away if you: Develop a severe headache or confusion. Have unusual weakness or numbness. Feel faint. Have severe pain in your chest or abdomen. Vomit repeatedly. Have trouble breathing. These symptoms may be an emergency. Get help right away. Call 911. Do not wait to see if the symptoms will go away. Do not drive yourself to the hospital. Summary Hypertension is when the force of blood pumping through your arteries is too strong. If this condition is not controlled, it may put you at risk for serious complications. Your personal target blood pressure may vary depending on your medical conditions, your age, and other factors. For most people, a normal blood pressure is less than 120/80. Hypertension is treated with lifestyle changes, medicines, or a combination of both. Lifestyle changes include losing weight, eating a healthy,   low-sodium diet, exercising more, and limiting alcohol. This information is not intended to replace advice given to you by your health care provider. Make sure you discuss any questions you have with your health care provider. Document Revised: 07/12/2021 Document Reviewed: 07/12/2021 Elsevier Patient Education  2023 [employer]  

## 2022-10-25 DIAGNOSIS — M25511 Pain in right shoulder: Secondary | ICD-10-CM | POA: Diagnosis not present

## 2022-10-25 DIAGNOSIS — Z4789 Encounter for other orthopedic aftercare: Secondary | ICD-10-CM | POA: Diagnosis not present

## 2022-10-25 DIAGNOSIS — R531 Weakness: Secondary | ICD-10-CM | POA: Diagnosis not present

## 2022-10-26 ENCOUNTER — Encounter: Payer: Self-pay | Admitting: Pulmonary Disease

## 2022-10-26 ENCOUNTER — Ambulatory Visit: Payer: Medicare PPO | Admitting: Pulmonary Disease

## 2022-10-26 VITALS — BP 142/78 | HR 80 | Temp 98.0°F | Ht 67.0 in | Wt 166.8 lb

## 2022-10-26 DIAGNOSIS — J432 Centrilobular emphysema: Secondary | ICD-10-CM | POA: Diagnosis not present

## 2022-10-26 DIAGNOSIS — Z87891 Personal history of nicotine dependence: Secondary | ICD-10-CM | POA: Diagnosis not present

## 2022-10-26 MED ORDER — RSVPREF3 VAC RECOMB ADJUVANTED 120 MCG/0.5ML IM SUSR: 0.5000 mL | Freq: Once | INTRAMUSCULAR | 0 refills | Status: AC

## 2022-10-26 MED ORDER — STIOLTO RESPIMAT 2.5-2.5 MCG/ACT IN AERS: 1.0000 | INHALATION_SPRAY | Freq: Every day | RESPIRATORY_TRACT | 5 refills | Status: AC

## 2022-10-26 NOTE — Patient Instructions (Signed)
Former cigarette smoker: We will enroll you in the lung cancer screening program  Centrilobular emphysema shortness of breath: Use Combivent 1 puff every 6 hours as needed for shortness of breath Full lung function test Stay physically active Practice good hand hygiene Keep your immunizations up-to-date: We will give you an RSV vaccine prescription today  Aortic stenosis: Keep follow-up with cardiology  We will see you back in 6 weeks or sooner if needed

## 2022-10-26 NOTE — Progress Notes (Signed)
Synopsis: Referred in February 2024 for possible COPD.  Has a history of aortic stenosis and hypertension followed by Dr. Oval Linsey in cardiology. Quit smoking in 2023 after smoking more than 1 ppd for 50 years  Subjective:   PATIENT ID: Brian Hunt GENDER: male DOB: [birth year], MRN: [id number]   HPI  Chief Complaint  Patient presents with   Consult    Cxr 11/54, sob     Haig has a history of aortic stenosis and follows with Cardiology A few weeks ago he had shoulder surgery and afterwards he had more shortness of breath He had CXR's after Immediately following surgery his oxygen level was very low and "his lung was asleep".  He's been told he had COPD in the past: > He's not had breathing tests for it > he's had shortness of breath for 10 years or more > he feels chest congestion quite a bit, coughs up phlegm regularly > he exercises three times per week which helps with his dyspnea > he'll occasionally have bronchitis, never needed a doctors visit  Cigarette smoking: > parents smoked > he started in his 20's, then smoked for 50 years > he smoked at least 1 ppd > he quit 1 year ago  Alcohol: drank heavily for 10 years, quit a while back  Former teacher/administrator [employer]   Record review: Seen by Dr. Skeet Latch in January 2024 in the context of's moderate to severe aortic stenosis and hypertension.  Referred to [place] for evaluation of dyspnea, likely COPD.  Past Medical History:  Diagnosis Date   Anemia    Anxiety    Arthritis    Basal cell carcinoma    Basal Cell X2 , Dr Radford Pax   BPH (benign prostatic hyperplasia)    Bursitis of left hip    Dr. Ihor Gully   Carotid artery occlusion    Carotid stenosis 38/75/6433   Complication of anesthesia    vasovagal reaction after l9/7/12 left THA after he got to his room   COPD (chronic obstructive pulmonary disease) ([place])    Diverticulosis    Dyspnea    with exertion   Elevated PSA    Heart  murmur    mild AI/AS 06/30/21 echo   Hyperlipidemia    Hypertension    Internal hemorrhoids    Leg pain 06/15/2021   PAD (peripheral artery disease) ([place]) 10/17/2021   Prostatitis    Dr Terance Hart   RBBB    Shortness of breath 09/29/2022     Family History  Problem Relation Age of Onset   Arthritis Mother    Vascular Disease Mother        Varicose Veins   Hypertension Mother    Hyperlipidemia Mother    Transient ischemic attack Mother    Heart disease Mother        After age 52   Stroke Mother    COPD Father    Bone cancer Maternal Grandmother    Heart attack Paternal Grandmother 18   Colon cancer Son 31   Turner syndrome Daughter    Asthma Neg Hx    Diabetes Neg Hx    Pancreatic cancer Neg Hx    Esophageal cancer Neg Hx    Stomach cancer Neg Hx    Liver disease Neg Hx    Rectal cancer Neg Hx      Social History   Socioeconomic History   Marital status: Married    Spouse name: Lovey Newcomer   Number of children: 2  Years of education: Not on file   Highest education level: Not on file  Occupational History   Occupation: retired  Tobacco Use   Smoking status: Former    Packs/day: 0.00    Years: 40.00    Total pack years: 0.00    Types: Cigarettes    Quit date: 06/14/2021    Years since quitting: 1.3   Smokeless tobacco: Never  Vaping Use   Vaping Use: Never used  Substance and Sexual Activity   Alcohol use: No    Comment: 2001- states no rehab   Drug use: No   Sexual activity: Yes  Other Topics Concern   Not on file  Social History Narrative   Not on file   Social Determinants of Health   Financial Resource Strain: Low Risk  (07/12/2022)   Overall Financial Resource Strain (CARDIA)    Difficulty of Paying Living Expenses: Not hard at all  Food Insecurity: No Food Insecurity (07/12/2022)   Hunger Vital Sign    Worried About Running Out of Food in the Last Year: Never true    [place] in the Last Year: Never true  Transportation Needs: No  Transportation Needs (07/12/2022)   PRAPARE - Hydrologist (Medical): No    Lack of Transportation (Non-Medical): No  Physical Activity: Sufficiently Active (07/12/2022)   Exercise Vital Sign    Days of Exercise per Week: 3 days    Minutes of Exercise per Session: 60 min  Stress: No Stress Concern Present (07/12/2022)   [place]    Feeling of Stress : Not at all  Social Connections: [place] (07/12/2022)   Social Connection and Isolation Panel [NHANES]    Frequency of Communication with Friends and Family: More than three times a week    Frequency of Social Gatherings with Friends and Family: Once a week    Attends Religious Services: More than 4 times per year    Active Member of [employer] or Organizations: Yes    Attends Music therapist: More than 4 times per year    Marital Status: Married  Human resources officer Violence: Not At Risk (07/12/2022)   Humiliation, Afraid, Rape, and Kick questionnaire    Fear of Current or Ex-Partner: No    Emotionally Abused: No    Physically Abused: No    Sexually Abused: No     Allergies  Allergen Reactions   Demeclocycline Nausea Only    Nausea and constipation   Pravastatin Other (See Comments)    myopathy   Lisinopril Cough   Oxycodone     Mental confusion     Outpatient Medications Prior to Visit  Medication Sig Dispense Refill   amLODipine (NORVASC) 5 MG tablet TAKE 1 TABLET(5 MG) BY MOUTH DAILY 90 tablet 1   aspirin EC 81 MG tablet Take 81 mg by mouth daily.     cilostazol (PLETAL) 50 MG tablet Take 1 tablet (50 mg total) by mouth 2 (two) times daily. 180 tablet 0   hydrocortisone cream 1 % Apply 1 Application topically daily as needed for itching.     rosuvastatin (CRESTOR) 5 MG tablet TAKE 1 TABLET EVERY DAY 90 tablet 1   tamsulosin (FLOMAX) 0.4 MG CAPS capsule Take 0.4 mg by mouth at bedtime.     telmisartan  (MICARDIS) 80 MG tablet TAKE 1 TABLET(80 MG) BY MOUTH DAILY 90 tablet 3   tiZANidine (ZANAFLEX) 2 MG tablet Take  1 tablet (2 mg total) by mouth every 8 (eight) hours as needed for muscle spasms. 30 tablet 0   zinc gluconate 50 MG tablet Take 1 tablet (50 mg total) by mouth daily. 90 tablet 1   No facility-administered medications prior to visit.    Review of Systems  Constitutional:  Negative for chills, fever, malaise/fatigue and weight loss.  HENT:  Negative for congestion, nosebleeds, sinus pain and sore throat.   Eyes:  Negative for photophobia, pain and discharge.  Respiratory:  Positive for cough, sputum production and shortness of breath. Negative for hemoptysis and wheezing.   Cardiovascular:  Negative for chest pain, palpitations, orthopnea and leg swelling.  Gastrointestinal:  Negative for abdominal pain, constipation, diarrhea, nausea and vomiting.  Genitourinary:  Negative for dysuria, frequency, hematuria and urgency.  Musculoskeletal:  Negative for back pain, joint pain, myalgias and neck pain.  Skin:  Negative for itching and rash.  Neurological:  Negative for tingling, tremors, sensory change, speech change, focal weakness, seizures, weakness and headaches.  Psychiatric/Behavioral:  Negative for memory loss, substance abuse and suicidal ideas. The patient is not nervous/anxious.       Objective:  Physical Exam   Vitals:   10/26/22 1030  BP: (!) 142/78  Pulse: 80  Temp: 98 F (36.7 C)  TempSrc: Oral  SpO2: 98%  Weight: 166 lb 12.8 oz (75.7 kg)  Height: '5\' 7"'$  (1.702 m)    Gen: well appearing, no acute distress HENT: NCAT, OP clear, neck supple without masses Eyes: PERRL, EOMi Lymph: no cervical lymphadenopathy PULM: Wheezing left lower lobe CV: RRR, systolic murmur loudest right upper sternal border, no JVD GI: BS+, soft, nontender, no hsm Derm: no rash or skin breakdown MSK: normal bulk and tone Neuro: A&Ox4, CN II-XII intact, strength 5/5 in all 4  extremities Psyche: normal mood and affect   CBC    Component Value Date/Time   WBC 5.9 10/24/2022 0947   RBC 4.13 (L) 10/24/2022 0947   HGB 13.6 10/24/2022 0947   HGB 14.4 07/18/2021 0832   HCT 39.7 10/24/2022 0947   HCT 42.6 07/18/2021 0832   PLT 342.0 10/24/2022 0947   PLT 329 07/18/2021 0832   MCV 96.2 10/24/2022 0947   MCV 94 07/18/2021 0832   MCH 32.5 08/01/2022 1150   MCHC 34.1 10/24/2022 0947   RDW 14.1 10/24/2022 0947   RDW 11.5 (L) 07/18/2021 0832   LYMPHSABS 1.0 10/24/2022 0947   LYMPHSABS 1.2 07/18/2021 0832   MONOABS 0.9 10/24/2022 0947   EOSABS 0.2 10/24/2022 0947   EOSABS 0.3 07/18/2021 0832   BASOSABS 0.1 10/24/2022 0947   BASOSABS 0.1 07/18/2021 0832     Chest imaging: 2022 CT chest images independently reviewed showing small stable nodules, centrilobular emphysema, some borderline increased airway caliber and bases October 2023 CT shoulder images independently reviewed showing right lung findings only moderate upper lobe predominant emphysema, significant airway thickening and dilation in the right base  PFT:  Labs:  Path:  Echo: January 2024 transthoracic echocardiogram moderate to severe aortic stenosis, mean gradient 35 mmHg LVEF 70%, severe LVH, RV dilated but systolic function normal, trivial MR severe left atrial enlargement  Heart Catheterization:       Assessment & Plan:   Centrilobular emphysema ([place])  Former smoker  Discussion: Tabius has centrilobular emphysema and will most likely end up having COPD after we have performed lung function testing.  He is quit smoking which is important.  We talked about the importance of preventing anything from  happening that which would make his emphysema worse such as infections.  He needs to have an RSV vaccine but has had all of his other vaccinations.  At this point I think it is reasonable to start with an as needed short acting bronchodilator.  However, if he has worsening symptoms then he may  need to have a long-acting bronchodilator.  He needs to be enrolled in lung cancer screening  Lan: Former cigarette smoker: We will enroll you in the lung cancer screening program  Centrilobular emphysema shortness of breath: Use Combivent 1 puff every 6 hours as needed for shortness of breath Full lung function test Stay physically active Practice good hand hygiene Keep your immunizations up-to-date: We will give you an RSV vaccine prescription today  Aortic stenosis: Keep follow-up with cardiology  We will see you back in 6 weeks or sooner if needed  Immunizations: Immunization History  Administered Date(s) Administered   Influenza Whole 09/19/1999, 07/24/2013   Influenza, High Dose Seasonal PF 06/14/2016, 07/16/2017, 08/19/2018   Influenza-Unspecified 06/13/2019, 06/21/2020, 07/18/2021   PFIZER(Purple Top)SARS-COV-2 Vaccination 10/08/2019, 10/29/2019, 06/21/2020   Pneumococcal Conjugate-13 02/26/2014   Pneumococcal Polysaccharide-23 06/19/2011, 09/18/2014, 11/17/2021   Tdap 05/11/2015   Zoster Recombinat (Shingrix) 11/30/2021, 03/30/2022     Current Outpatient Medications:    amLODipine (NORVASC) 5 MG tablet, TAKE 1 TABLET(5 MG) BY MOUTH DAILY, Disp: 90 tablet, Rfl: 1   aspirin EC 81 MG tablet, Take 81 mg by mouth daily., Disp: , Rfl:    cilostazol (PLETAL) 50 MG tablet, Take 1 tablet (50 mg total) by mouth 2 (two) times daily., Disp: 180 tablet, Rfl: 0   hydrocortisone cream 1 %, Apply 1 Application topically daily as needed for itching., Disp: , Rfl:    rosuvastatin (CRESTOR) 5 MG tablet, TAKE 1 TABLET EVERY DAY, Disp: 90 tablet, Rfl: 1   tamsulosin (FLOMAX) 0.4 MG CAPS capsule, Take 0.4 mg by mouth at bedtime., Disp: , Rfl:    telmisartan (MICARDIS) 80 MG tablet, TAKE 1 TABLET(80 MG) BY MOUTH DAILY, Disp: 90 tablet, Rfl: 3   tiZANidine (ZANAFLEX) 2 MG tablet, Take 1 tablet (2 mg total) by mouth every 8 (eight) hours as needed for muscle spasms., Disp: 30 tablet, Rfl:  0   zinc gluconate 50 MG tablet, Take 1 tablet (50 mg total) by mouth daily., Disp: 90 tablet, Rfl: 1

## 2022-10-27 ENCOUNTER — Encounter: Payer: Self-pay | Admitting: Internal Medicine

## 2022-10-27 DIAGNOSIS — M25511 Pain in right shoulder: Secondary | ICD-10-CM | POA: Diagnosis not present

## 2022-10-27 DIAGNOSIS — R531 Weakness: Secondary | ICD-10-CM | POA: Diagnosis not present

## 2022-10-27 DIAGNOSIS — Z4789 Encounter for other orthopedic aftercare: Secondary | ICD-10-CM | POA: Diagnosis not present

## 2022-10-28 ENCOUNTER — Other Ambulatory Visit: Payer: Self-pay | Admitting: Internal Medicine

## 2022-10-28 DIAGNOSIS — F5104 Psychophysiologic insomnia: Secondary | ICD-10-CM | POA: Insufficient documentation

## 2022-10-28 MED ORDER — BELSOMRA 15 MG PO TABS: 1.0000 | ORAL_TABLET | Freq: Every day | ORAL | 0 refills | Status: DC

## 2022-10-29 DIAGNOSIS — N182 Chronic kidney disease, stage 2 (mild): Secondary | ICD-10-CM | POA: Insufficient documentation

## 2022-10-29 MED ORDER — BELSOMRA 15 MG PO TABS: 15.0000 mg | ORAL_TABLET | Freq: Every evening | ORAL | 1 refills | Status: DC | PRN

## 2022-11-07 ENCOUNTER — Telehealth (HOSPITAL_BASED_OUTPATIENT_CLINIC_OR_DEPARTMENT_OTHER): Payer: Self-pay | Admitting: *Deleted

## 2022-11-07 ENCOUNTER — Telehealth: Payer: Self-pay | Admitting: *Deleted

## 2022-11-07 NOTE — Telephone Encounter (Signed)
Secure chat message sent to Alvina Filbert to contact the patient to return the itamar deice if he does not plan to do it or send to billing.

## 2022-11-09 DIAGNOSIS — Z4789 Encounter for other orthopedic aftercare: Secondary | ICD-10-CM | POA: Diagnosis not present

## 2022-11-09 DIAGNOSIS — M25511 Pain in right shoulder: Secondary | ICD-10-CM | POA: Diagnosis not present

## 2022-11-09 DIAGNOSIS — R531 Weakness: Secondary | ICD-10-CM | POA: Diagnosis not present

## 2022-11-13 DIAGNOSIS — R531 Weakness: Secondary | ICD-10-CM | POA: Diagnosis not present

## 2022-11-13 DIAGNOSIS — Z4789 Encounter for other orthopedic aftercare: Secondary | ICD-10-CM | POA: Diagnosis not present

## 2022-11-13 DIAGNOSIS — M25511 Pain in right shoulder: Secondary | ICD-10-CM | POA: Diagnosis not present

## 2022-11-15 DIAGNOSIS — Z4789 Encounter for other orthopedic aftercare: Secondary | ICD-10-CM | POA: Diagnosis not present

## 2022-11-15 DIAGNOSIS — R531 Weakness: Secondary | ICD-10-CM | POA: Diagnosis not present

## 2022-11-15 DIAGNOSIS — M25511 Pain in right shoulder: Secondary | ICD-10-CM | POA: Diagnosis not present

## 2022-11-20 ENCOUNTER — Other Ambulatory Visit (HOSPITAL_BASED_OUTPATIENT_CLINIC_OR_DEPARTMENT_OTHER): Payer: Self-pay

## 2022-11-20 DIAGNOSIS — I6522 Occlusion and stenosis of left carotid artery: Secondary | ICD-10-CM

## 2022-11-20 DIAGNOSIS — I350 Nonrheumatic aortic (valve) stenosis: Secondary | ICD-10-CM

## 2022-11-20 NOTE — Progress Notes (Signed)
Results released via mychart with Dr. Blenda Mounts comments and repeat testing ordered.

## 2022-11-21 DIAGNOSIS — R531 Weakness: Secondary | ICD-10-CM | POA: Diagnosis not present

## 2022-11-21 DIAGNOSIS — Z4789 Encounter for other orthopedic aftercare: Secondary | ICD-10-CM | POA: Diagnosis not present

## 2022-11-21 DIAGNOSIS — M25511 Pain in right shoulder: Secondary | ICD-10-CM | POA: Diagnosis not present

## 2022-11-23 DIAGNOSIS — R531 Weakness: Secondary | ICD-10-CM | POA: Diagnosis not present

## 2022-11-23 DIAGNOSIS — Z4789 Encounter for other orthopedic aftercare: Secondary | ICD-10-CM | POA: Diagnosis not present

## 2022-11-23 DIAGNOSIS — M25511 Pain in right shoulder: Secondary | ICD-10-CM | POA: Diagnosis not present

## 2022-11-24 ENCOUNTER — Encounter (INDEPENDENT_AMBULATORY_CARE_PROVIDER_SITE_OTHER): Payer: Medicare PPO | Admitting: Cardiology

## 2022-11-24 DIAGNOSIS — G4733 Obstructive sleep apnea (adult) (pediatric): Secondary | ICD-10-CM

## 2022-11-24 DIAGNOSIS — H524 Presbyopia: Secondary | ICD-10-CM | POA: Diagnosis not present

## 2022-11-24 DIAGNOSIS — H02831 Dermatochalasis of right upper eyelid: Secondary | ICD-10-CM | POA: Diagnosis not present

## 2022-11-24 DIAGNOSIS — H25813 Combined forms of age-related cataract, bilateral: Secondary | ICD-10-CM | POA: Diagnosis not present

## 2022-11-24 DIAGNOSIS — H353132 Nonexudative age-related macular degeneration, bilateral, intermediate dry stage: Secondary | ICD-10-CM | POA: Diagnosis not present

## 2022-11-24 DIAGNOSIS — H04123 Dry eye syndrome of bilateral lacrimal glands: Secondary | ICD-10-CM | POA: Diagnosis not present

## 2022-11-27 NOTE — Telephone Encounter (Signed)
Mychart message received and stated he has completed

## 2022-11-29 ENCOUNTER — Encounter: Payer: Self-pay | Admitting: Internal Medicine

## 2022-12-06 ENCOUNTER — Ambulatory Visit: Payer: Medicare PPO | Attending: Cardiovascular Disease

## 2022-12-06 DIAGNOSIS — R0683 Snoring: Secondary | ICD-10-CM

## 2022-12-06 DIAGNOSIS — Z4789 Encounter for other orthopedic aftercare: Secondary | ICD-10-CM | POA: Diagnosis not present

## 2022-12-06 DIAGNOSIS — M25511 Pain in right shoulder: Secondary | ICD-10-CM | POA: Diagnosis not present

## 2022-12-06 DIAGNOSIS — R400 Somnolence: Secondary | ICD-10-CM

## 2022-12-06 DIAGNOSIS — R531 Weakness: Secondary | ICD-10-CM | POA: Diagnosis not present

## 2022-12-06 NOTE — Procedures (Signed)
SLEEP STUDY REPORT Patient Information Study Date: 11/24/2022 Patient Name: Brian Hunt Patient ID: [id number] Birth Date: 05-05-1946 Age: 77 Gender: Male BMI: 26.9 (W=167 lb, H=5' 6'') Referring Physician: Skeet Latch, MD  TEST DESCRIPTION:  Home sleep apnea testing was completed using the WatchPat, a Type 1 device, utilizing peripheral arterial tonometry (PAT), chest movement, actigraphy, pulse oximetry, pulse rate, body position and snore.  AHI was calculated with apnea and hypopnea using valid sleep time as the denominator. RDI includes apneas, hypopneas, and RERAs.  The data acquired and the scoring of sleep and all associated events were performed in accordance with the recommended standards and specifications as outlined in the AASM Manual for the Scoring of Sleep and Associated Events 2.2.0 (2015).  FINDINGS:  1.  Moderate Obstructive Sleep Apnea with AHI 23.9/hr.   2.  No Central Sleep Apnea with pAHIc 2.5/hr.  3.  Oxygen desaturations as low as 68%.  4.  Moderate snoring was present. O2 sats were < 88% for 3.9 min.  5.  Total sleep time was 5 hrs and 56 min.  6.  27.5% of total sleep time was spent in REM sleep.   7.  Normal sleep onset latency at 18 min  8.  Shortened REM sleep onset latency at 79 min.   9.  Total awakenings were 12.  10. Arrhythmia detection:  None.  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  Clinical correlation of these findings is necessary.  The decision to treat obstructive sleep apnea (OSA) is usually based on the presence of apnea symptoms or the presence of associated medical conditions such as Hypertension, Congestive Heart Failure, Atrial Fibrillation or Obesity.  The most common symptoms of OSA are snoring, gasping for breath while sleeping, daytime sleepiness and fatigue.   2.  Initiating apnea therapy is recommended given the presence of symptoms and/or associated conditions. Recommend proceeding  with one of the following:     a.  Auto-CPAP therapy with a pressure range of 5-20cm H2O.     b.  An oral appliance (OA) that can be obtained from certain dentists with expertise in sleep medicine.  These are primarily of use in non-obese patients with mild and moderate disease.     c.  An ENT consultation which may be useful to look for specific causes of obstruction and possible treatment options.     d.  If patient is intolerant to PAP therapy, consider referral to ENT for evaluation for hypoglossal nerve stimulator.   3.  Close follow-up is necessary to ensure success with CPAP or oral appliance therapy for maximum benefit.  4.  A follow-up oximetry study on CPAP is recommended to assess the adequacy of therapy and determine the need for supplemental oxygen or the potential need for Bi-level therapy.  An arterial blood gas to determine the adequacy of baseline ventilation and oxygenation should also be considered.  5.  Healthy sleep recommendations include:  adequate nightly sleep (normal 7-9 hrs/night), avoidance of caffeine after noon and alcohol near bedtime, and maintaining a sleep environment that is cool, dark and quiet.  6.  Weight loss for overweight patients is recommended.  Even modest amounts of weight loss can significantly improve the severity of sleep apnea.  7.  Snoring recommendations include:  weight loss where appropriate, side sleeping, and avoidance of alcohol before bed.  8.  Operation of motor vehicle should not be performed when sleepy.  Signature: Fransico Him, MD; [facility]; Diplomat,  American Board of Sleep Medicine Electronically Signed: 12/06/2022

## 2022-12-08 ENCOUNTER — Telehealth: Payer: Self-pay | Admitting: *Deleted

## 2022-12-08 ENCOUNTER — Other Ambulatory Visit: Payer: Self-pay | Admitting: Cardiology

## 2022-12-08 DIAGNOSIS — G4736 Sleep related hypoventilation in conditions classified elsewhere: Secondary | ICD-10-CM

## 2022-12-08 DIAGNOSIS — G4733 Obstructive sleep apnea (adult) (pediatric): Secondary | ICD-10-CM

## 2022-12-08 NOTE — Telephone Encounter (Signed)
Patient notified of sleep study results and recommendations. He agrees to start CPAP. [employer] notified orders in EPIC.

## 2022-12-08 NOTE — Telephone Encounter (Signed)
-----   Message from Sueanne Margarita, MD sent at 12/06/2022  8:31 AM EDT ----- Please let patient know that they have sleep apnea and recommend treating with CPAP.  Please order an auto CPAP from 4-15cm H2O with heated humidity and mask of choice.  Order overnight pulse ox on CPAP.  Followup with me in 6 weeks.

## 2022-12-26 ENCOUNTER — Encounter: Payer: Self-pay | Admitting: *Deleted

## 2022-12-27 DIAGNOSIS — G4733 Obstructive sleep apnea (adult) (pediatric): Secondary | ICD-10-CM | POA: Diagnosis not present

## 2022-12-29 DIAGNOSIS — H6123 Impacted cerumen, bilateral: Secondary | ICD-10-CM | POA: Diagnosis not present

## 2022-12-29 DIAGNOSIS — H903 Sensorineural hearing loss, bilateral: Secondary | ICD-10-CM | POA: Diagnosis not present

## 2023-01-01 DIAGNOSIS — G4733 Obstructive sleep apnea (adult) (pediatric): Secondary | ICD-10-CM | POA: Diagnosis not present

## 2023-01-02 ENCOUNTER — Other Ambulatory Visit: Payer: Self-pay | Admitting: Internal Medicine

## 2023-01-02 ENCOUNTER — Other Ambulatory Visit: Payer: Self-pay | Admitting: Cardiovascular Disease

## 2023-01-02 DIAGNOSIS — I10 Essential (primary) hypertension: Secondary | ICD-10-CM

## 2023-01-02 NOTE — Telephone Encounter (Signed)
Rx(s) sent to pharmacy electronically.  

## 2023-01-05 ENCOUNTER — Other Ambulatory Visit: Payer: Self-pay | Admitting: Internal Medicine

## 2023-01-05 DIAGNOSIS — I739 Peripheral vascular disease, unspecified: Secondary | ICD-10-CM

## 2023-01-05 DIAGNOSIS — E785 Hyperlipidemia, unspecified: Secondary | ICD-10-CM

## 2023-01-05 DIAGNOSIS — I6523 Occlusion and stenosis of bilateral carotid arteries: Secondary | ICD-10-CM

## 2023-01-10 DIAGNOSIS — M25519 Pain in unspecified shoulder: Secondary | ICD-10-CM | POA: Diagnosis not present

## 2023-01-10 DIAGNOSIS — M25559 Pain in unspecified hip: Secondary | ICD-10-CM | POA: Diagnosis not present

## 2023-01-16 DIAGNOSIS — M25519 Pain in unspecified shoulder: Secondary | ICD-10-CM | POA: Diagnosis not present

## 2023-01-16 DIAGNOSIS — M25559 Pain in unspecified hip: Secondary | ICD-10-CM | POA: Diagnosis not present

## 2023-01-18 DIAGNOSIS — M25519 Pain in unspecified shoulder: Secondary | ICD-10-CM | POA: Diagnosis not present

## 2023-01-18 DIAGNOSIS — M25559 Pain in unspecified hip: Secondary | ICD-10-CM | POA: Diagnosis not present

## 2023-01-23 DIAGNOSIS — G4730 Sleep apnea, unspecified: Secondary | ICD-10-CM | POA: Diagnosis not present

## 2023-01-23 DIAGNOSIS — R0683 Snoring: Secondary | ICD-10-CM | POA: Diagnosis not present

## 2023-01-25 DIAGNOSIS — M25519 Pain in unspecified shoulder: Secondary | ICD-10-CM | POA: Diagnosis not present

## 2023-01-25 DIAGNOSIS — M25559 Pain in unspecified hip: Secondary | ICD-10-CM | POA: Diagnosis not present

## 2023-01-26 DIAGNOSIS — G4733 Obstructive sleep apnea (adult) (pediatric): Secondary | ICD-10-CM | POA: Diagnosis not present

## 2023-02-01 ENCOUNTER — Encounter: Payer: Self-pay | Admitting: Cardiology

## 2023-02-01 ENCOUNTER — Telehealth: Payer: Self-pay

## 2023-02-01 DIAGNOSIS — I10 Essential (primary) hypertension: Secondary | ICD-10-CM

## 2023-02-01 DIAGNOSIS — G4733 Obstructive sleep apnea (adult) (pediatric): Secondary | ICD-10-CM

## 2023-02-01 DIAGNOSIS — R0683 Snoring: Secondary | ICD-10-CM

## 2023-02-01 DIAGNOSIS — R400 Somnolence: Secondary | ICD-10-CM

## 2023-02-01 DIAGNOSIS — J449 Chronic obstructive pulmonary disease, unspecified: Secondary | ICD-10-CM

## 2023-02-01 NOTE — Telephone Encounter (Signed)
Called and left message asking if patient can come in earlier on 03/26/23.

## 2023-02-01 NOTE — Telephone Encounter (Signed)
Patient moved to earlier appt on 03/26/23.

## 2023-02-01 NOTE — Telephone Encounter (Signed)
Patient is returning call. Call transferred to [place], [place].

## 2023-02-03 DIAGNOSIS — S60941A Unspecified superficial injury of left index finger, initial encounter: Secondary | ICD-10-CM | POA: Diagnosis not present

## 2023-02-03 DIAGNOSIS — S67191A Crushing injury of left index finger, initial encounter: Secondary | ICD-10-CM | POA: Diagnosis not present

## 2023-02-06 ENCOUNTER — Other Ambulatory Visit: Payer: Self-pay | Admitting: Cardiovascular Disease

## 2023-02-06 DIAGNOSIS — M25519 Pain in unspecified shoulder: Secondary | ICD-10-CM | POA: Diagnosis not present

## 2023-02-06 DIAGNOSIS — M25559 Pain in unspecified hip: Secondary | ICD-10-CM | POA: Diagnosis not present

## 2023-02-07 NOTE — Addendum Note (Signed)
Addended by: Reesa Chew on: 02/07/2023 06:57 PM   Modules accepted: Orders

## 2023-02-07 NOTE — Telephone Encounter (Signed)
Brian Hunt never ordered the ONO but the order has been placed now.

## 2023-02-08 ENCOUNTER — Encounter: Payer: Self-pay | Admitting: Cardiology

## 2023-02-08 DIAGNOSIS — M25519 Pain in unspecified shoulder: Secondary | ICD-10-CM | POA: Diagnosis not present

## 2023-02-08 DIAGNOSIS — M25559 Pain in unspecified hip: Secondary | ICD-10-CM | POA: Diagnosis not present

## 2023-02-13 DIAGNOSIS — M25519 Pain in unspecified shoulder: Secondary | ICD-10-CM | POA: Diagnosis not present

## 2023-02-13 DIAGNOSIS — M25559 Pain in unspecified hip: Secondary | ICD-10-CM | POA: Diagnosis not present

## 2023-02-14 DIAGNOSIS — G4733 Obstructive sleep apnea (adult) (pediatric): Secondary | ICD-10-CM | POA: Diagnosis not present

## 2023-02-15 NOTE — Telephone Encounter (Signed)
Pt calling back for an update on some questions he has. He stated "I will be out all day and if she can't get please leave a message"

## 2023-02-19 DIAGNOSIS — Z471 Aftercare following joint replacement surgery: Secondary | ICD-10-CM | POA: Diagnosis not present

## 2023-02-19 DIAGNOSIS — Z96611 Presence of right artificial shoulder joint: Secondary | ICD-10-CM | POA: Diagnosis not present

## 2023-02-22 DIAGNOSIS — M25519 Pain in unspecified shoulder: Secondary | ICD-10-CM | POA: Diagnosis not present

## 2023-02-22 DIAGNOSIS — M25559 Pain in unspecified hip: Secondary | ICD-10-CM | POA: Diagnosis not present

## 2023-02-26 DIAGNOSIS — G4733 Obstructive sleep apnea (adult) (pediatric): Secondary | ICD-10-CM | POA: Diagnosis not present

## 2023-03-05 DIAGNOSIS — M25519 Pain in unspecified shoulder: Secondary | ICD-10-CM | POA: Diagnosis not present

## 2023-03-05 DIAGNOSIS — M25559 Pain in unspecified hip: Secondary | ICD-10-CM | POA: Diagnosis not present

## 2023-03-15 DIAGNOSIS — M25519 Pain in unspecified shoulder: Secondary | ICD-10-CM | POA: Diagnosis not present

## 2023-03-15 DIAGNOSIS — M25559 Pain in unspecified hip: Secondary | ICD-10-CM | POA: Diagnosis not present

## 2023-03-17 DIAGNOSIS — G4733 Obstructive sleep apnea (adult) (pediatric): Secondary | ICD-10-CM | POA: Diagnosis not present

## 2023-03-26 ENCOUNTER — Encounter: Payer: Self-pay | Admitting: Cardiology

## 2023-03-26 ENCOUNTER — Ambulatory Visit: Payer: Medicare PPO | Attending: Cardiology | Admitting: Cardiology

## 2023-03-26 VITALS — BP 122/74 | HR 76 | Ht 67.0 in | Wt 162.2 lb

## 2023-03-26 DIAGNOSIS — G4733 Obstructive sleep apnea (adult) (pediatric): Secondary | ICD-10-CM | POA: Diagnosis not present

## 2023-03-26 DIAGNOSIS — I10 Essential (primary) hypertension: Secondary | ICD-10-CM

## 2023-03-26 NOTE — Patient Instructions (Addendum)
Medication Instructions:  Your physician recommends that you continue on your current medications as directed. Please refer to the Current Medication list given to you today.  *If you need a refill on your cardiac medications before your next appointment, please call your pharmacy*  Testing/Procedures: Overnight pulse oximeter while on Oxygen and CPAP  Follow-Up: At [facility], you and your health needs are our priority.  As part of our continuing mission to provide you with exceptional heart care, we have created designated Provider Care Teams.  These Care Teams include your primary Cardiologist (physician) and Advanced Practice Providers (APPs -  Physician Assistants and Nurse Practitioners) who all work together to provide you with the care you need, when you need it.  Your next appointment:   1 year  Provider:   Armanda Magic, MD

## 2023-03-26 NOTE — Addendum Note (Signed)
Addended by: Frutoso Schatz on: 03/26/2023 11:22 AM   Modules accepted: Orders

## 2023-03-26 NOTE — Progress Notes (Addendum)
Sleep Medicine CONSULT Note    Date:  03/26/2023   ID:  Brian Hunt, DOB Jan 21, 1946, MRN [id number]  PCP:  Etta Grandchild, MD  Cardiologist: Chilton Si, MD  Chief Complaint  Patient presents with   New Patient (Initial Visit)    Obstructive sleep apnea    History of Present Illness:  Brian Hunt is a 77 y.o. male who is being seen today for the evaluation of obstructive sleep apnea at the request of Chilton Si, MD.  This is a 77 year old male with a history of hypertension, hyperlipidemia, COPD, carotid artery stenosis who was seen by Dr. Duke Salvia in January 2024 and complained of problems sleeping for only 4 to 5 hours a night associated with nocturia up to 6 times a night.  He has been having difficulties staying asleep and occasionally snores if he sleeping on his back.  He has also been having problems with sleepiness during the day and occasion will catch himself drifting off to sleep.  He has been sleeping in a recliner as well.  He says right after he eats most meals he will go and sit in his chair and fell asleep.  He underwent home sleep study which showed moderate obstructive sleep apnea with an AHI of 23.9/h with lowest O2 saturation 68% with O2 saturations less than 88% for only 3.9 minutes.  He was started on auto CPAP from 4 to 15 cm H2O.  He is now referred for sleep medicine consultation to establish sleep care.  He is doing well with his PAP device and thinks that he has gotten used to it.  He tolerates the nasal pillow mask and feels the pressure is adequate.  Since going on PAP he feels rested in the am and has no significant daytime sleepiness.  He denies any significant mouth or nasal dryness or nasal congestion.  He does not think that he snores.    Past Medical History:  Diagnosis Date   Anemia    Anxiety    Arthritis    Basal cell carcinoma    Basal Cell X2 , Dr Mayford Knife   BPH (benign prostatic hyperplasia)    Bursitis of left hip    Dr. 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 echo   Hyperlipidemia    Hypertension    Internal hemorrhoids    Leg pain 06/15/2021   PAD (peripheral artery disease) (HCC) 10/17/2021   Prostatitis    Dr Vonita Moss   RBBB    Shortness of breath 09/29/2022    Past Surgical History:  Procedure Laterality Date   COLONOSCOPY  2010   negative; [facility]   CYSTOSCOPY  10/2014   [facility]; neg   dislocation of right hip  2019   GREEN LIGHT LASER TURP (TRANSURETHRAL RESECTION OF PROSTATE  09/27/2012   Procedure: GREEN LIGHT LASER TURP (TRANSURETHRAL RESECTION OF PROSTATE;  Surgeon: [name], MD;  Location: [facility];  Service: Urology;  Laterality: N/A;      JOINT REPLACEMENT Left Oct. 12, 2012   left total hip replacement by Dr. Driana Dazey Daniels   PROSTATE BIOPSY  2007 ,  2009   X2 , Dr Cleatrice Burke  & Dr Vonita Moss   PROSTATE SURGERY     Laser   TOTAL HIP ARTHROPLASTY Right 09/26/2017   TOTAL HIP ARTHROPLASTY Right 09/26/2017   Procedure: TOTAL HIP ARTHROPLASTY ANTERIOR APPROACH;  Surgeon: Gean Birchwood, MD;  Location: [facility] OR;  Service: Orthopedics;  Laterality: Right;   TOTAL SHOULDER ARTHROPLASTY Right 08/17/2022   Procedure: TOTAL SHOULDER ARTHROPLASTY WITH AUGMENTED GLENOID;  Surgeon: Jones Broom, MD;  Location: [facility];  Service: Orthopedics;  Laterality: Right;    Current Medications: Current Meds  Medication Sig   amLODipine (NORVASC) 5 MG tablet TAKE 1 TABLET(5 MG) BY MOUTH DAILY   aspirin EC 81 MG tablet Take 81 mg by mouth daily.   cilostazol (PLETAL) 50 MG tablet TAKE 1 TABLET(50 MG) BY MOUTH TWICE DAILY   hydrocortisone cream 1 % Apply 1 Application topically daily as needed for itching.   rosuvastatin  (CRESTOR) 5 MG tablet TAKE 1 TABLET EVERY DAY   Suvorexant (BELSOMRA) 15 MG TABS Take 1 tablet (15 mg total) by mouth at bedtime as needed.   tamsulosin (FLOMAX) 0.4 MG CAPS capsule Take 0.4 mg by mouth at bedtime.   tiZANidine (ZANAFLEX) 2 MG tablet Take 1 tablet (2 mg total) by mouth every 8 (eight) hours as needed for muscle spasms.   zinc gluconate 50 MG tablet Take 1 tablet (50 mg total) by mouth daily.    Allergies:   Demeclocycline, Pravastatin, Lisinopril, and Oxycodone   Social History   Socioeconomic History   Marital status: Married    Spouse name: Andrey Campanile   Number of children: 2   Years of education: Not on file   Highest education level: Not on file  Occupational History   Occupation: retired  Tobacco Use   Smoking status: Former    Packs/day: 0.00    Years: 40.00    Additional pack years: 0.00    Total pack years: 0.00    Types: Cigarettes    Quit date: 06/14/2021    Years since quitting: 1.7   Smokeless tobacco: Never  Vaping Use   Vaping Use: Never used  Substance and Sexual Activity   Alcohol use: No    Comment: 2001- states no rehab   Drug use: No   Sexual activity: Yes  Other Topics Concern   Not on file  Social History Narrative   Not on file   Social Determinants of Health   Financial Resource Strain: Low Risk  (07/12/2022)   Overall Financial Resource Strain (CARDIA)    Difficulty of Paying Living Expenses: Not hard at all  Food Insecurity: No Food Insecurity (07/12/2022)   Hunger Vital Sign    Worried About Running Out of Food in the Last Year: Never true    Ran Out of Food in the Last Year: Never true  Transportation Needs: No Transportation Needs (07/12/2022)   PRAPARE - Administrator, Civil Service (Medical): No    Lack of Transportation (Non-Medical): No  Physical Activity: Sufficiently Active (07/12/2022)   Exercise Vital Sign    Days of Exercise per Week: 3 days    Minutes of Exercise per Session: 60 min  Stress: No  Stress Concern Present (07/12/2022)   [employer] of Occupational Health - Occupational Stress Questionnaire    Feeling of Stress : Not at all  Social Connections: Socially Integrated (07/12/2022)   Social Connection and Isolation Panel [NHANES]    Frequency of Communication with Friends and Family: More than three  times a week    Frequency of Social Gatherings with Friends and Family: Once a week    Attends Religious Services: More than 4 times per year    Active Member of [employer] or Organizations: Yes    Attends Engineer, structural: More than 4 times per year    Marital Status: Married     Family History:  The patient's family history includes Arthritis in his mother; Bone cancer in his maternal grandmother; COPD in his father; Colon cancer (age of onset: 62) in his son; Heart attack (age of onset: 11) in his paternal grandmother; Heart disease in his mother; Hyperlipidemia in his mother; Hypertension in his mother; Stroke in his mother; Transient ischemic attack in his mother; Deetya Drouillard syndrome in his daughter; Vascular Disease in his mother.   ROS:   Please see the history of present illness.    ROS All other systems reviewed and are negative.      No data to display             PHYSICAL EXAM:   VS:  BP 122/74   Pulse 76   Ht 5\' 7"  (1.702 m)   Wt 162 lb 3.2 oz (73.6 kg)   SpO2 97%   BMI 25.40 kg/m    GEN: Well nourished, well developed, in no acute distress  HEENT: normal  Neck: no JVD  or masses.  Bilateral carotid bruits related to heart murmur Cardiac: RRR; no rubs, or gallops,no edema. 2/6 late peaking SM at RUSB to LLSB and into the carotid arteries bilaterally  Intact distal pulses bilaterally.  Respiratory:  clear to auscultation bilaterally, normal work of breathing GI: soft, nontender, nondistended, + BS MS: no deformity or atrophy  Skin: warm and dry, no rash Neuro:  Alert and Oriented x 3, Strength and sensation are intact Psych: euthymic mood,  full affect  Wt Readings from Last 3 Encounters:  03/26/23 162 lb 3.2 oz (73.6 kg)  10/26/22 166 lb 12.8 oz (75.7 kg)  10/24/22 167 lb (75.8 kg)      Studies/Labs Reviewed:   Home sleep study and PAP compliance download  Recent Labs: 05/25/2022: ALT 14; TSH 2.55 10/24/2022: BUN 15; Creatinine, Ser 0.87; Hemoglobin 13.6; Platelets 342.0; Potassium 4.2; Sodium 132    ASSESSMENT:    1. Obstructive sleep apnea (adult) (pediatric)   2. Primary hypertension      PLAN:  In order of problems listed above:  OSA - The patient is tolerating PAP therapy well without any problems. The PAP download performed by his DME was personally reviewed and interpreted by me today and showed an AHI of 0.6 /hr on auto CPAP from 4-15 cm H2O with 100% compliance in using more than 4 hours nightly.  The patient has been using and benefiting from PAP use and will continue to benefit from therapy.  -He is doing really well with his CPAP and his obstructive sleep apnea is completely controlled on current CPAP settings -he had residual nocturnal hypoxemia on CPAP and O2 was ordered but he has not started that because he was waiting for another ONO to get to his house -I encouraged him to start his O2 at night as previously prescribed and we will order another ONO on CPAP and O2  2.  Hypertension -BP is controlled on exam today -he has been able to stop his ARB due to improvement in BP after starting the CPAP -Continue prescription drug management with amlodipine 5 mg daily with as needed  refills  Followup with me in 1 year  Time Spent: 20 minutes total time of encounter, including 15 minutes spent in face-to-face patient care on the date of this encounter. This time includes coordination of care and counseling regarding above mentioned problem list. Remainder of non-face-to-face time involved reviewing chart documents/testing relevant to the patient encounter and documentation in the medical record. I have  independently reviewed documentation from referring provider  Medication Adjustments/Labs and Tests Ordered: Current medicines are reviewed at length with the patient today.  Concerns regarding medicines are outlined above.  Medication changes, Labs and Tests ordered today are listed in the Patient Instructions below.  There are no Patient Instructions on file for this visit.   Signed, Armanda Magic, MD  03/26/2023 11:05 AM    [facility] [address] [place], [place], [place]  [address] Phone: [phone]; Fax: [phone]

## 2023-03-27 DIAGNOSIS — M25519 Pain in unspecified shoulder: Secondary | ICD-10-CM | POA: Diagnosis not present

## 2023-03-27 DIAGNOSIS — M25559 Pain in unspecified hip: Secondary | ICD-10-CM | POA: Diagnosis not present

## 2023-03-28 DIAGNOSIS — G4733 Obstructive sleep apnea (adult) (pediatric): Secondary | ICD-10-CM | POA: Diagnosis not present

## 2023-04-04 ENCOUNTER — Encounter: Payer: Self-pay | Admitting: Cardiology

## 2023-04-04 DIAGNOSIS — G4733 Obstructive sleep apnea (adult) (pediatric): Secondary | ICD-10-CM | POA: Diagnosis not present

## 2023-04-04 DIAGNOSIS — R400 Somnolence: Secondary | ICD-10-CM

## 2023-04-04 DIAGNOSIS — R0683 Snoring: Secondary | ICD-10-CM

## 2023-04-05 DIAGNOSIS — M25519 Pain in unspecified shoulder: Secondary | ICD-10-CM | POA: Diagnosis not present

## 2023-04-05 DIAGNOSIS — M25559 Pain in unspecified hip: Secondary | ICD-10-CM | POA: Diagnosis not present

## 2023-04-10 NOTE — Addendum Note (Signed)
Addended by: Reesa Chew on: 04/10/2023 06:55 PM   Modules accepted: Orders

## 2023-04-16 DIAGNOSIS — M25559 Pain in unspecified hip: Secondary | ICD-10-CM | POA: Diagnosis not present

## 2023-04-16 DIAGNOSIS — G4733 Obstructive sleep apnea (adult) (pediatric): Secondary | ICD-10-CM | POA: Diagnosis not present

## 2023-04-16 DIAGNOSIS — M25519 Pain in unspecified shoulder: Secondary | ICD-10-CM | POA: Diagnosis not present

## 2023-04-17 ENCOUNTER — Encounter: Payer: Self-pay | Admitting: Internal Medicine

## 2023-04-17 ENCOUNTER — Ambulatory Visit: Payer: Medicare PPO | Admitting: Internal Medicine

## 2023-04-17 VITALS — BP 136/72 | HR 75 | Temp 98.1°F | Resp 16 | Ht 67.0 in | Wt 162.0 lb

## 2023-04-17 DIAGNOSIS — E785 Hyperlipidemia, unspecified: Secondary | ICD-10-CM

## 2023-04-17 DIAGNOSIS — I10 Essential (primary) hypertension: Secondary | ICD-10-CM | POA: Diagnosis not present

## 2023-04-17 DIAGNOSIS — R0902 Hypoxemia: Secondary | ICD-10-CM | POA: Diagnosis not present

## 2023-04-17 DIAGNOSIS — G4730 Sleep apnea, unspecified: Secondary | ICD-10-CM | POA: Diagnosis not present

## 2023-04-17 DIAGNOSIS — R739 Hyperglycemia, unspecified: Secondary | ICD-10-CM | POA: Diagnosis not present

## 2023-04-17 LAB — LIPID PANEL
Cholesterol: 112 mg/dL (ref 0–200)
HDL: 67.2 mg/dL (ref >39.00)
LDL Cholesterol: 36 mg/dL (ref 0–99)
NonHDL: 44.95
Total CHOL/HDL Ratio: 2
Triglycerides: 43 mg/dL (ref 0.0–149.0)
VLDL: 8.6 mg/dL (ref 0.0–40.0)

## 2023-04-17 LAB — BASIC METABOLIC PANEL
BUN: 14 mg/dL (ref 6–23)
CO2: 26 mEq/L (ref 19–32)
Calcium: 8.9 mg/dL (ref 8.4–10.5)
Chloride: 98 mEq/L (ref 96–112)
Creatinine, Ser: 0.87 mg/dL (ref 0.40–1.50)
GFR: 83.64 mL/min (ref >60.00)
Glucose, Bld: 117 mg/dL — ABNORMAL HIGH (ref 70–99)
Potassium: 4 mEq/L (ref 3.5–5.1)
Sodium: 131 mEq/L — ABNORMAL LOW (ref 135–145)

## 2023-04-17 LAB — HEMOGLOBIN A1C: Hgb A1c MFr Bld: 5.7 % (ref 4.6–6.5)

## 2023-04-17 LAB — TSH: TSH: 1.91 u[IU]/mL (ref 0.35–5.50)

## 2023-04-17 MED ORDER — TELMISARTAN 40 MG PO TABS
40.0000 mg | ORAL_TABLET | Freq: Every day | ORAL | 1 refills | Status: DC
Start: 2023-04-17 — End: 2023-07-18

## 2023-04-17 NOTE — Patient Instructions (Signed)
Health Maintenance, Male Adopting a healthy lifestyle and getting preventive care are important in promoting health and wellness. Ask your health care provider about: The right schedule for you to have regular tests and exams. Things you can do on your own to prevent diseases and keep yourself healthy. What should I know about diet, weight, and exercise? Eat a healthy diet  Eat a diet that includes plenty of vegetables, fruits, low-fat dairy products, and lean protein. Do not eat a lot of foods that are high in solid fats, added sugars, or sodium. Maintain a healthy weight Body mass index (BMI) is a measurement that can be used to identify possible weight problems. It estimates body fat based on height and weight. Your health care provider can help determine your BMI and help you achieve or maintain a healthy weight. Get regular exercise Get regular exercise. This is one of the most important things you can do for your health. Most adults should: Exercise for at least 150 minutes each week. The exercise should increase your heart rate and make you sweat (moderate-intensity exercise). Do strengthening exercises at least twice a week. This is in addition to the moderate-intensity exercise. Spend less time sitting. Even light physical activity can be beneficial. Watch cholesterol and blood lipids Have your blood tested for lipids and cholesterol at 77 years of age, then have this test every 5 years. You may need to have your cholesterol levels checked more often if: Your lipid or cholesterol levels are high. You are older than 77 years of age. You are at high risk for heart disease. What should I know about cancer screening? Many types of cancers can be detected early and may often be prevented. Depending on your health history and family history, you may need to have cancer screening at various ages. This may include screening for: Colorectal cancer. Prostate cancer. Skin cancer. Lung  cancer. What should I know about heart disease, diabetes, and high blood pressure? Blood pressure and heart disease High blood pressure causes heart disease and increases the risk of stroke. This is more likely to develop in people who have high blood pressure readings or are overweight. Talk with your health care provider about your target blood pressure readings. Have your blood pressure checked: Every 3-5 years if you are 18-39 years of age. Every year if you are 40 years old or older. If you are between the ages of 65 and 75 and are a current or former smoker, ask your health care provider if you should have a one-time screening for abdominal aortic aneurysm (AAA). Diabetes Have regular diabetes screenings. This checks your fasting blood sugar level. Have the screening done: Once every three years after age 45 if you are at a normal weight and have a low risk for diabetes. More often and at a younger age if you are overweight or have a high risk for diabetes. What should I know about preventing infection? Hepatitis B If you have a higher risk for hepatitis B, you should be screened for this virus. Talk with your health care provider to find out if you are at risk for hepatitis B infection. Hepatitis C Blood testing is recommended for: Everyone born from 1945 through 1965. Anyone with known risk factors for hepatitis C. Sexually transmitted infections (STIs) You should be screened each year for STIs, including gonorrhea and chlamydia, if: You are sexually active and are younger than 77 years of age. You are older than 77 years of age and your   health care provider tells you that you are at risk for this type of infection. Your sexual activity has changed since you were last screened, and you are at increased risk for chlamydia or gonorrhea. Ask your health care provider if you are at risk. Ask your health care provider about whether you are at high risk for HIV. Your health care provider  may recommend a prescription medicine to help prevent HIV infection. If you choose to take medicine to prevent HIV, you should first get tested for HIV. You should then be tested every 3 months for as long as you are taking the medicine. Follow these instructions at home: Alcohol use Do not drink alcohol if your health care provider tells you not to drink. If you drink alcohol: Limit how much you have to 0-2 drinks a day. Know how much alcohol is in your drink. In the [place] one drink equals one 12 oz bottle of beer (355 mL), one 5 oz glass of wine (148 mL), or one 1 oz glass of hard liquor (44 mL). Lifestyle Do not use any products that contain nicotine or tobacco. These products include cigarettes, chewing tobacco, and vaping devices, such as e-cigarettes. If you need help quitting, ask your health care provider. Do not use street drugs. Do not share needles. Ask your health care provider for help if you need support or information about quitting drugs. General instructions Schedule regular health, dental, and eye exams. Stay current with your vaccines. Tell your health care provider if: You often feel depressed. You have ever been abused or do not feel safe at home. Summary Adopting a healthy lifestyle and getting preventive care are important in promoting health and wellness. Follow your health care provider's instructions about healthy diet, exercising, and getting tested or screened for diseases. Follow your health care provider's instructions on monitoring your cholesterol and blood pressure. This information is not intended to replace advice given to you by your health care provider. Make sure you discuss any questions you have with your health care provider. Document Revised: 01/24/2021 Document Reviewed: 01/24/2021 Elsevier Patient Education  2024 Elsevier Inc.  

## 2023-04-17 NOTE — Progress Notes (Signed)
Subjective:  Patient ID: Brian Hunt, male    DOB: [birth year]  Age: 77 y.o. MRN: [id number]  CC: Hypertension and Annual Exam   HPI Brian Hunt presents for a CPX and f/up ----  Discussed the use of AI scribe software for clinical note transcription with the patient, who gave verbal consent to proceed.  History of Present Illness   The patient, with a history of hypertension, sleep apnea, and peripheral artery disease, presents with a recent change in medication due to episodes of lightheadedness and dizziness. They discontinued telmisartan in May, which has resulted in an improvement in symptoms and a return to their normal blood pressure range.  They have also recently started using a CPAP machine for sleep apnea, which has significantly reduced their episodes from 23 per hour to an average of 0.6 per hour over the last three months. However, they report that their oxygen levels have been low, reaching 85 at the lowest, and they have been advised to use supplemental oxygen at night. They attempted to use an oxygen converter but found it too noisy and disruptive to their sleep.  In addition to these changes, the patient has been seeing a physical therapist for strength training to address atrophy in the lower half of their legs and limited range of motion in their shoulder following a procedure in November. They report improvements in their peripheral artery disease as a result of this therapy.  The patient also has a history of urinary issues, which are managed with flomax and monitored by a urologist. They report no new changes or concerns in this area.       Outpatient Medications Prior to Visit  Medication Sig Dispense Refill   amLODipine (NORVASC) 5 MG tablet TAKE 1 TABLET(5 MG) BY MOUTH DAILY 90 tablet 1   aspirin EC 81 MG tablet Take 81 mg by mouth daily.     cilostazol (PLETAL) 50 MG tablet TAKE 1 TABLET(50 MG) BY MOUTH TWICE DAILY 180 tablet 2   hydrocortisone cream 1 %  Apply 1 Application topically daily as needed for itching.     rosuvastatin (CRESTOR) 5 MG tablet TAKE 1 TABLET EVERY DAY 90 tablet 1   Suvorexant (BELSOMRA) 15 MG TABS Take 1 tablet (15 mg total) by mouth at bedtime as needed. 90 tablet 1   tamsulosin (FLOMAX) 0.4 MG CAPS capsule Take 0.4 mg by mouth at bedtime.     tiZANidine (ZANAFLEX) 2 MG tablet Take 1 tablet (2 mg total) by mouth every 8 (eight) hours as needed for muscle spasms. 30 tablet 0   zinc gluconate 50 MG tablet Take 1 tablet (50 mg total) by mouth daily. 90 tablet 1   telmisartan (MICARDIS) 80 MG tablet TAKE 1 TABLET(80 MG) BY MOUTH DAILY (Patient not taking: Reported on 03/26/2023) 90 tablet 1   No facility-administered medications prior to visit.    ROS Review of Systems  Constitutional:  Negative for appetite change, chills, diaphoresis, fatigue and fever.  HENT: Negative.    Eyes: Negative.   Respiratory:  Negative for cough, chest tightness, shortness of breath and wheezing.   Cardiovascular:  Negative for chest pain, palpitations and leg swelling.  Gastrointestinal:  Negative for abdominal pain, constipation, diarrhea, nausea and vomiting.  Genitourinary:  Positive for difficulty urinating. Negative for dysuria, hematuria and urgency.  Musculoskeletal: Negative.  Negative for arthralgias and myalgias.  Skin: Negative.  Negative for color change and pallor.  Neurological:  Negative for dizziness and weakness.  Hematological:  Negative for adenopathy. Does not bruise/bleed easily.  Psychiatric/Behavioral: Negative.      Objective:  BP 136/72 (BP Location: Left Arm, Patient Position: Sitting, Cuff Size: Large)   Pulse 75   Temp 98.1 F (36.7 C) (Oral)   Resp 16   Ht 5\' 7"  (1.702 m)   Wt 162 lb (73.5 kg)   SpO2 96%   BMI 25.37 kg/m   BP Readings from Last 3 Encounters:  04/17/23 136/72  03/26/23 122/74  10/26/22 (!) 142/78    Wt Readings from Last 3 Encounters:  04/17/23 162 lb (73.5 kg)  03/26/23 162 lb  3.2 oz (73.6 kg)  10/26/22 166 lb 12.8 oz (75.7 kg)    Physical Exam Vitals reviewed.  Constitutional:      Appearance: Normal appearance.  HENT:     Nose: Nose normal.     Mouth/Throat:     Mouth: Mucous membranes are moist.  Eyes:     General: No scleral icterus.    Conjunctiva/sclera: Conjunctivae normal.  Cardiovascular:     Rate and Rhythm: Normal rate and regular rhythm.     Pulses: Normal pulses.     Heart sounds: No murmur heard.    No friction rub. No gallop.     Comments: EKG- SR with SA, 75 bpm LAE, RBBB TWI inferiorly No LVH Unchanged Pulmonary:     Effort: Pulmonary effort is normal.     Breath sounds: No stridor. No wheezing, rhonchi or rales.  Abdominal:     General: Abdomen is flat.     Palpations: There is no mass.     Tenderness: There is no abdominal tenderness. There is no guarding.     Hernia: No hernia is present.  Musculoskeletal:     Cervical back: Neck supple.     Right lower leg: No edema.     Left lower leg: No edema.  Skin:    General: Skin is warm and dry.  Neurological:     General: No focal deficit present.     Mental Status: He is alert. Mental status is at baseline.  Psychiatric:        Mood and Affect: Mood normal.        Behavior: Behavior normal.     Lab Results  Component Value Date   WBC 5.9 10/24/2022   HGB 13.6 10/24/2022   HCT 39.7 10/24/2022   PLT 342.0 10/24/2022   GLUCOSE 117 (H) 04/17/2023   CHOL 112 04/17/2023   TRIG 43.0 04/17/2023   HDL 67.20 04/17/2023   LDLCALC 36 04/17/2023   ALT 14 05/25/2022   AST 17 05/25/2022   NA 131 (L) 04/17/2023   K 4.0 04/17/2023   CL 98 04/17/2023   CREATININE 0.87 04/17/2023   BUN 14 04/17/2023   CO2 26 04/17/2023   TSH 1.91 04/17/2023   PSA 3.2 10/09/2022   INR 1.10 09/24/2017   HGBA1C 5.7 04/17/2023    No results found.  Assessment & Plan:   Primary hypertension - His BP is well controlled. Will continue the current ARB dose. -     EKG 12-Lead -     Basic  metabolic panel; Future -     Telmisartan; Take 1 tablet (40 mg total) by mouth daily.  Dispense: 90 tablet; Refill: 1  Hyperlipidemia with target LDL less than 70- LDL goal achieved. Doing well on the statin  -     Lipid panel; Future -     Lipoprotein A (LPA); Future -  TSH; Future  Hyperglycemia- A1C is 5.7% -     Basic metabolic panel; Future -     Hemoglobin A1c; Future     Follow-up: Return in about 6 months (around 10/18/2023).  Sanda Linger, MD

## 2023-04-28 DIAGNOSIS — G4733 Obstructive sleep apnea (adult) (pediatric): Secondary | ICD-10-CM | POA: Diagnosis not present

## 2023-05-10 DIAGNOSIS — M25519 Pain in unspecified shoulder: Secondary | ICD-10-CM | POA: Diagnosis not present

## 2023-05-10 DIAGNOSIS — M25559 Pain in unspecified hip: Secondary | ICD-10-CM | POA: Diagnosis not present

## 2023-05-14 DIAGNOSIS — M25559 Pain in unspecified hip: Secondary | ICD-10-CM | POA: Diagnosis not present

## 2023-05-14 DIAGNOSIS — M25519 Pain in unspecified shoulder: Secondary | ICD-10-CM | POA: Diagnosis not present

## 2023-05-28 DIAGNOSIS — M25519 Pain in unspecified shoulder: Secondary | ICD-10-CM | POA: Diagnosis not present

## 2023-05-28 DIAGNOSIS — M25559 Pain in unspecified hip: Secondary | ICD-10-CM | POA: Diagnosis not present

## 2023-05-29 DIAGNOSIS — G4733 Obstructive sleep apnea (adult) (pediatric): Secondary | ICD-10-CM | POA: Diagnosis not present

## 2023-05-31 ENCOUNTER — Other Ambulatory Visit: Payer: Self-pay | Admitting: Internal Medicine

## 2023-05-31 ENCOUNTER — Ambulatory Visit (INDEPENDENT_AMBULATORY_CARE_PROVIDER_SITE_OTHER): Payer: Medicare PPO

## 2023-05-31 DIAGNOSIS — I6523 Occlusion and stenosis of bilateral carotid arteries: Secondary | ICD-10-CM

## 2023-05-31 DIAGNOSIS — I6522 Occlusion and stenosis of left carotid artery: Secondary | ICD-10-CM

## 2023-05-31 DIAGNOSIS — E785 Hyperlipidemia, unspecified: Secondary | ICD-10-CM

## 2023-05-31 DIAGNOSIS — I739 Peripheral vascular disease, unspecified: Secondary | ICD-10-CM

## 2023-05-31 DIAGNOSIS — I350 Nonrheumatic aortic (valve) stenosis: Secondary | ICD-10-CM | POA: Diagnosis not present

## 2023-05-31 LAB — ECHOCARDIOGRAM COMPLETE
AR max vel: 0.85 cm2
AV Area VTI: 0.79 cm2
AV Area mean vel: 0.7 cm2
AV Mean grad: 41 mmHg
AV Peak grad: 60.8 mmHg
Ao pk vel: 3.9 m/s
Area-P 1/2: 2.45 cm2
MV VTI: 1.21 cm2
P 1/2 time: 458 ms
S' Lateral: 2.18 cm

## 2023-06-05 DIAGNOSIS — M25559 Pain in unspecified hip: Secondary | ICD-10-CM | POA: Diagnosis not present

## 2023-06-05 DIAGNOSIS — M25519 Pain in unspecified shoulder: Secondary | ICD-10-CM | POA: Diagnosis not present

## 2023-06-06 NOTE — Progress Notes (Signed)
Cardiology Office Note:  .    Date:  06/08/2023  ID:  RAMEIR Brian Hunt, DOB Oct 29, 1945, MRN [id number] PCP: Etta Grandchild, MD  [facility] Providers Cardiologist:  None Sleep Medicine:  Armanda Magic, MD     History of Present Illness: .    Brian Hunt is a 77 y.o. male with a hx of severe aortic stenosis, anemia, anxiety, arthritis, basal cell carcinoma, BPH, carotid artery occlusion, complication of anesthesia (vasal vagal reaction), COPD, diverticulosis, DOE, hyperlipidemia, hypertension, RBBB, OSA, here for follow-up. He saw Dr. Yetta Barre 02/2021, and his blood pressure was 136/78. He reported that his blood pressure had been well controlled. Mild aortic stenosis was also noted and he was referred to cardiology. He previously had an echo 02/2018 with LVEF 65-70%, grade 1 diastolic dysfunction, and mild stenosis with a mean gradient of 13 mm HG.   He complained of bilateral LE pain. He had ABIs 06/2021 that revealed moderate disease bilaterally. Arterial dopplers showed 30-49% stenosis in the L common femoral and total occlusion of the L SFA. There was irregular plaque on the right that could not be quantified. Carotid dopplers showed moderate stenosis in the bilateral ICAs. Echo 06/2021 revealed LVEF 60-65% and mild aortic stenosis. He was referred to Dr. Allyson Sabal and was started on Pletal 07/2021. They made plans to re-evaluate for angiography in 3 months. He stopped smoking 06/2021.    In 09/2021, his blood pressure continued to be labile, but it averaged in the 130s/70s and was controlled in the office. He continued exercising regularly. He continues to follow up with Dr. Allyson Sabal for PAD. When he last saw him 04/2022 he was doing well and he had some mild claudication. They discussed peripheral angiography and he wanted to think about it.   At his visit 09/2022, his blood pressure was high in the office but consistently well controlled at home. He expressed concerns with insomnia, shortness of  breath/orthopnea since his shoulder replacement surgery 6 weeks prior. He also had poor air movement on exam and was referred to pulmonary. He had an echo 09/2022 showing moderate to severe aortic stenosis (was mild in 2022). He completed an Itamar sleep study revealing moderate obstructive sleep apnea and was recommended for CPAP. Repeat echo 05/2023 showed LVEF 45-50%, left ventricular global hypokinesis, mild LVH, and severe aortic stenosis.  Today, he states he has some good and bad days. On the worst days he struggles with fatigue that he notices as soon as he wakes up. Recently he began seeing hypotensive readings while on amlodipine and telmisartan, associated with lightheadedness and dizziness. His blood pressures would sometimes be lower than 100s/50s so he self-discontinued his telmisartan for 3-4 weeks. After following up with his PCP he was advised to stop his amlodipine instead and resume telmisartan at 40 mg daily. Last night his blood pressure was 114/68, however yesterday in the gym it was in the 80s/40s. He notes that his diastolic pressures are usually under 70 mmHg. At home, 115-125/65-75 are his normal BP averages. In the office his blood pressure is 152/70 initially, and 158/72 on manual recheck. He experiences some shortness of breath that seems to be stable. He is using his new inhaler and is tolerating his CPAP which he uses every night. Occasionally he has been aware of numbness in his left hand, but not this week. Usually this occurs in the mornings or with actions such as holding the newspaper. He has been able to work the muscles in his  hand to relieve the numbness eventually. Typically he remains active and works out at [employer] including [employer], resistance bands, and exercise bike. He tries to achieve a heart rate of 119-130 bpm when riding the exercise bike. Also he participates in physical therapy for his shoulder and leg strength. He has been able to play golf for longer  before experiencing leg pain secondary to PAD. Additionally he complains of some hard stools with low moisture. He does drink a lot of fluids and is conscientious of his fiber intake. He is also taking 20 mg antacids. He denies any palpitations, chest pain, peripheral edema, headaches, syncope, orthopnea, or PND.  ROS:  Please see the history of present illness. All other systems are reviewed and negative.  (+) Fatigue (+) Lightheadedness/Dizziness (+) Shortness of breath (+) Left hand numbness (+) Hard stools  Studies Reviewed: .        Echo  2023/06/23:  1. Left ventricular ejection fraction, by estimation, is 45 to 50%. Left  ventricular ejection fraction by 3D volume is 48 %. The left ventricle has  mildly decreased function. The left ventricle demonstrates global  hypokinesis. There is mild concentric  left ventricular hypertrophy. Left ventricular diastolic function could  not be evaluated.   2. Right ventricular systolic function is normal. The right ventricular  size is normal.   3. Left atrial size was moderately dilated.   4. Right atrial size was severely dilated.   5. The mitral valve is degenerative. Trivial mitral valve regurgitation.  No evidence of mitral stenosis. The mean mitral valve gradient is 4.0  mmHg. Moderate to severe mitral annular calcification.   6. The aortic valve is calcified. There is moderate calcification of the  aortic valve. There is severe thickening of the aortic valve. Aortic valve  regurgitation is mild. Severe aortic valve stenosis. Aortic valve area, by  VTI measures 0.79 cm. Aortic  valve mean gradient measures 41.0 mmHg. Aortic valve Vmax measures 3.90  m/s.   7. The inferior vena cava is normal in size with greater than 50%  respiratory variability, suggesting right atrial pressure of 3 mmHg.   Carotid Dopplers  09/25/2022: Summary:  Right Carotid: Velocities in the right ICA are consistent with a 1-39% stenosis. Unable to obtain right  arm pressure due to recent shoulder surgery.   Left Carotid: Velocities in the left ICA are consistent with a 40-59% stenosis.   Vertebrals: Bilateral vertebral arteries demonstrate antegrade flow.  Subclavians: Normal flow hemodynamics were seen in bilateral subclavian arteries.   Risk Assessment/Calculations:     HYPERTENSION CONTROL Vitals:   06/08/23 0845 06/08/23 0848 06/08/23 0903  BP: (!) 150/68 (!) 152/70 (!) 158/72    The patient's blood pressure is elevated above target today.  In order to address the patient's elevated BP: The blood pressure is usually elevated in clinic.  Blood pressures monitored at home have been optimal.      STOP-Bang Score:  6      Physical Exam:    VS:  BP (!) 158/72 (BP Location: Left Arm, Patient Position: Sitting, Cuff Size: Normal)   Pulse 80   Ht 5\' 7"  (1.702 m)   Wt 165 lb 12.8 oz (75.2 kg)   SpO2 98%   BMI 25.97 kg/m  , BMI Body mass index is 25.97 kg/m. GENERAL:  Well appearing HEENT: Pupils equal round and reactive, fundi not visualized, oral mucosa unremarkable NECK:  No jugular venous distention, waveform within normal limits, carotid upstroke brisk and  symmetric, no bruits, no thyromegaly LUNGS:  Clear to auscultation bilaterally HEART:  RRR.  PMI not displaced or sustained,S1 within normal limits.  Absent S2, no S3, no S4, no clicks, no rubs, III/IV late-peaking systolic murmur at the LUSB ABD:  Flat, positive bowel sounds normal in frequency in pitch, no bruits, no rebound, no guarding, no midline pulsatile mass, no hepatomegaly, no splenomegaly EXT:  2 plus pulses throughout, no edema, no cyanosis no clubbing SKIN:  No rashes no nodules NEURO:  Cranial nerves II through XII grossly intact, motor grossly intact throughout PSYCH:  Cognitively intact, oriented to person place and time  Wt Readings from Last 3 Encounters:  06/08/23 165 lb 12.8 oz (75.2 kg)  04/17/23 162 lb (73.5 kg)  03/26/23 162 lb 3.2 oz (73.6 kg)      ASSESSMENT AND PLAN: .    # Severe Aortic Stenosis: # HFmrEF:  Echocardiogram shows mean  aortic valve gradient of 41 (previously 35), indicating progression to severe stenosis. Left ventricular ejection fraction has decreased to 45-50%, likely due to increased strain from the stenotic valve. No symptoms of heart failure reported. -Refer to structural heart team for evaluation for Transcatheter Aortic Valve Replacement (TAVR). -Schedule L/R cardiac catheterization to assess coronary arteries prior to valve intervention. -Advise patient to continue physical activity but avoid strenuous exertion until after valve replacement.  # Hypertension Patient self-adjusted antihypertensive medication due to episodes of lightheadedness with low blood pressure readings at home. Currently on Telmisartan 40mg  daily, with home readings averaging 115-125/65-75.  Agree with stopping amlodipine.  Avoid hypotension 2/2 severe AS.  -Continue Telmisartan 40mg  daily. -Encourage patient to monitor blood pressure at home and report any significant changes.  # Peripheral Artery Disease Patient reports improvement in leg discomfort with physical therapy. -Continue current physical therapy regimen. -Advise patient to inform physical therapist about severe aortic stenosis diagnosis. - Continue aspirin, cilostazol, and rosuvastatin  # Chronic Obstructive Pulmonary Disease Patient reports using inhaler as prescribed by pulmonologist. Some shortness of breath with exertion, but overall stable. -Continue current inhaler regimen. -Advise patient to report any significant changes in breathing.  #OSA:  -Continue CPAP for sleep apnea.      Informed Consent   Shared Decision Making/Informed Consent The risks [stroke (1 in 1000), death (1 in 1000), kidney failure [usually temporary] (1 in 500), bleeding (1 in 200), allergic reaction [possibly serious] (1 in 200)], benefits (diagnostic support and management of coronary  artery disease) and alternatives of a cardiac catheterization were discussed in detail with Mr. Ormond and he is willing to proceed.     Dispo:  FU with Romonia Yanik C. Duke Salvia, MD, [facility] in 3 months.  I,Mathew Stumpf,acting as a Neurosurgeon for Chilton Si, MD.,have documented all relevant documentation on the behalf of Chilton Si, MD,as directed by  Chilton Si, MD while in the presence of Chilton Si, MD.  I, Aboubacar Matsuo C. Duke Salvia, MD have reviewed all documentation for this visit.  The documentation of the exam, diagnosis, procedures, and orders on 06/08/2023 are all accurate and complete.   Signed, Chilton Si, MD

## 2023-06-06 NOTE — H&P (View-Only) (Signed)
Cardiology Office Note:  .    Date:  06/08/2023  ID:  RAMEIR Brian Hunt, DOB Oct 29, 1945, MRN [id number] PCP: Brian Grandchild, MD  Brian Hunt Providers Cardiologist:  None Sleep Medicine:  Brian Magic, MD     History of Present Illness: .    Brian Hunt is a 77 y.o. male with a hx of severe aortic stenosis, anemia, anxiety, arthritis, basal cell carcinoma, BPH, carotid artery occlusion, complication of anesthesia (vasal vagal reaction), COPD, diverticulosis, DOE, hyperlipidemia, hypertension, RBBB, OSA, here for follow-up. He saw Dr. Yetta Hunt 02/2021, and his blood pressure was 136/78. He reported that his blood pressure had been well controlled. Mild aortic stenosis was also noted and he was referred to cardiology. He previously had an echo 02/2018 with LVEF 65-70%, grade 1 diastolic dysfunction, and mild stenosis with a mean gradient of 13 mm HG.   He complained of bilateral LE pain. He had ABIs 06/2021 that revealed moderate disease bilaterally. Arterial dopplers showed 30-49% stenosis in the L common femoral and total occlusion of the L SFA. There was irregular plaque on the right that could not be quantified. Carotid dopplers showed moderate stenosis in the bilateral ICAs. Echo 06/2021 revealed LVEF 60-65% and mild aortic stenosis. He was referred to Dr. Allyson Sabal and was started on Pletal 07/2021. They made plans to re-evaluate for angiography in 3 months. He stopped smoking 06/2021.    In 09/2021, his blood pressure continued to be labile, but it averaged in the 130s/70s and was controlled in the office. He continued exercising regularly. He continues to follow up with Dr. Allyson Sabal for PAD. When he last saw him 04/2022 he was doing well and he had some mild claudication. They discussed peripheral angiography and he wanted to think about it.   At his visit 09/2022, his blood pressure was high in the office but consistently well controlled at home. He expressed concerns with insomnia, shortness of  breath/orthopnea since his shoulder replacement surgery 6 weeks prior. He also had poor air movement on exam and was referred to pulmonary. He had an echo 09/2022 showing moderate to severe aortic stenosis (was mild in 2022). He completed an Itamar sleep study revealing moderate obstructive sleep apnea and was recommended for CPAP. Repeat echo 05/2023 showed LVEF 45-50%, left ventricular global hypokinesis, mild LVH, and severe aortic stenosis.  Today, he states he has some good and bad days. On the worst days he struggles with fatigue that he notices as soon as he wakes up. Recently he began seeing hypotensive readings while on amlodipine and telmisartan, associated with lightheadedness and dizziness. His blood pressures would sometimes be lower than 100s/50s so he self-discontinued his telmisartan for 3-4 weeks. After following up with his PCP he was advised to stop his amlodipine instead and resume telmisartan at 40 mg daily. Last night his blood pressure was 114/68, however yesterday in the gym it was in the 80s/40s. He notes that his diastolic pressures are usually under 70 mmHg. At home, 115-125/65-75 are his normal BP averages. In the office his blood pressure is 152/70 initially, and 158/72 on manual recheck. He experiences some shortness of breath that seems to be stable. He is using his new inhaler and is tolerating his CPAP which he uses every night. Occasionally he has been aware of numbness in his left hand, but not this week. Usually this occurs in the mornings or with actions such as holding the newspaper. He has been able to work the muscles in his  hand to relieve the numbness eventually. Typically he remains active and works out at [employer] including [employer], resistance bands, and exercise bike. He tries to achieve a heart rate of 119-130 bpm when riding the exercise bike. Also he participates in physical therapy for his shoulder and leg strength. He has been able to play golf for longer  before experiencing leg pain secondary to PAD. Additionally he complains of some hard stools with low moisture. He does drink a lot of fluids and is conscientious of his fiber intake. He is also taking 20 mg antacids. He denies any palpitations, chest pain, peripheral edema, headaches, syncope, orthopnea, or PND.  ROS:  Please see the history of present illness. All other systems are reviewed and negative.  (+) Fatigue (+) Lightheadedness/Dizziness (+) Shortness of breath (+) Left hand numbness (+) Hard stools  Studies Reviewed: .        Echo  2023/06/23:  1. Left ventricular ejection fraction, by estimation, is 45 to 50%. Left  ventricular ejection fraction by 3D volume is 48 %. The left ventricle has  mildly decreased function. The left ventricle demonstrates global  hypokinesis. There is mild concentric  left ventricular hypertrophy. Left ventricular diastolic function could  not be evaluated.   2. Right ventricular systolic function is normal. The right ventricular  size is normal.   3. Left atrial size was moderately dilated.   4. Right atrial size was severely dilated.   5. The mitral valve is degenerative. Trivial mitral valve regurgitation.  No evidence of mitral stenosis. The mean mitral valve gradient is 4.0  mmHg. Moderate to severe mitral annular calcification.   6. The aortic valve is calcified. There is moderate calcification of the  aortic valve. There is severe thickening of the aortic valve. Aortic valve  regurgitation is mild. Severe aortic valve stenosis. Aortic valve area, by  VTI measures 0.79 cm. Aortic  valve mean gradient measures 41.0 mmHg. Aortic valve Vmax measures 3.90  m/s.   7. The inferior vena cava is normal in size with greater than 50%  respiratory variability, suggesting right atrial pressure of 3 mmHg.   Carotid Dopplers  09/25/2022: Summary:  Right Carotid: Velocities in the right ICA are consistent with a 1-39% stenosis. Unable to obtain right  arm pressure due to recent shoulder surgery.   Left Carotid: Velocities in the left ICA are consistent with a 40-59% stenosis.   Vertebrals: Bilateral vertebral arteries demonstrate antegrade flow.  Subclavians: Normal flow hemodynamics were seen in bilateral subclavian arteries.   Risk Assessment/Calculations:     HYPERTENSION CONTROL Vitals:   06/08/23 0845 06/08/23 0848 06/08/23 0903  BP: (!) 150/68 (!) 152/70 (!) 158/72    The patient's blood pressure is elevated above target today.  In order to address the patient's elevated BP: The blood pressure is usually elevated in clinic.  Blood pressures monitored at home have been optimal.      STOP-Bang Score:  6      Physical Exam:    VS:  BP (!) 158/72 (BP Location: Left Arm, Patient Position: Sitting, Cuff Size: Normal)   Pulse 80   Ht 5\' 7"  (1.702 m)   Wt 165 lb 12.8 oz (75.2 kg)   SpO2 98%   BMI 25.97 kg/m  , BMI Body mass index is 25.97 kg/m. GENERAL:  Well appearing HEENT: Pupils equal round and reactive, fundi not visualized, oral mucosa unremarkable NECK:  No jugular venous distention, waveform within normal limits, carotid upstroke brisk and  symmetric, no bruits, no thyromegaly LUNGS:  Clear to auscultation bilaterally HEART:  RRR.  PMI not displaced or sustained,S1 within normal limits.  Absent S2, no S3, no S4, no clicks, no rubs, III/IV late-peaking systolic murmur at the LUSB ABD:  Flat, positive bowel sounds normal in frequency in pitch, no bruits, no rebound, no guarding, no midline pulsatile mass, no hepatomegaly, no splenomegaly EXT:  2 plus pulses throughout, no edema, no cyanosis no clubbing SKIN:  No rashes no nodules NEURO:  Cranial nerves II through XII grossly intact, motor grossly intact throughout PSYCH:  Cognitively intact, oriented to person place and time  Wt Readings from Last 3 Encounters:  06/08/23 165 lb 12.8 oz (75.2 kg)  04/17/23 162 lb (73.5 kg)  03/26/23 162 lb 3.2 oz (73.6 kg)      ASSESSMENT AND PLAN: .    # Severe Aortic Stenosis: # HFmrEF:  Echocardiogram shows mean  aortic valve gradient of 41 (previously 35), indicating progression to severe stenosis. Left ventricular ejection fraction has decreased to 45-50%, likely due to increased strain from the stenotic valve. No symptoms of heart failure reported. -Refer to structural heart team for evaluation for Transcatheter Aortic Valve Replacement (TAVR). -Schedule L/R cardiac catheterization to assess coronary arteries prior to valve intervention. -Advise patient to continue physical activity but avoid strenuous exertion until after valve replacement.  # Hypertension Patient self-adjusted antihypertensive medication due to episodes of lightheadedness with low blood pressure readings at home. Currently on Telmisartan 40mg  daily, with home readings averaging 115-125/65-75.  Agree with stopping amlodipine.  Avoid hypotension 2/2 severe AS.  -Continue Telmisartan 40mg  daily. -Encourage patient to monitor blood pressure at home and report any significant changes.  # Peripheral Artery Disease Patient reports improvement in leg discomfort with physical therapy. -Continue current physical therapy regimen. -Advise patient to inform physical therapist about severe aortic stenosis diagnosis. - Continue aspirin, cilostazol, and rosuvastatin  # Chronic Obstructive Pulmonary Disease Patient reports using inhaler as prescribed by pulmonologist. Some shortness of breath with exertion, but overall stable. -Continue current inhaler regimen. -Advise patient to report any significant changes in breathing.  #OSA:  -Continue CPAP for sleep apnea.      Informed Consent   Shared Decision Making/Informed Consent The risks [stroke (1 in 1000), death (1 in 1000), kidney failure [usually temporary] (1 in 500), bleeding (1 in 200), allergic reaction [possibly serious] (1 in 200)], benefits (diagnostic support and management of coronary  artery disease) and alternatives of a cardiac catheterization were discussed in detail with Mr. Ormond and he is willing to proceed.     Dispo:  FU with Romonia Yanik C. Duke Salvia, MD, [facility] in 3 months.  I,Mathew Stumpf,acting as a Neurosurgeon for Chilton Si, MD.,have documented all relevant documentation on the behalf of Chilton Si, MD,as directed by  Chilton Si, MD while in the presence of Chilton Si, MD.  I, Aboubacar Matsuo C. Duke Salvia, MD have reviewed all documentation for this visit.  The documentation of the exam, diagnosis, procedures, and orders on 06/08/2023 are all accurate and complete.   Signed, Chilton Si, MD

## 2023-06-08 ENCOUNTER — Encounter (HOSPITAL_BASED_OUTPATIENT_CLINIC_OR_DEPARTMENT_OTHER): Payer: Self-pay | Admitting: Cardiovascular Disease

## 2023-06-08 ENCOUNTER — Ambulatory Visit (HOSPITAL_BASED_OUTPATIENT_CLINIC_OR_DEPARTMENT_OTHER): Payer: Medicare PPO | Admitting: Cardiovascular Disease

## 2023-06-08 VITALS — BP 158/72 | HR 80 | Ht 67.0 in | Wt 165.8 lb

## 2023-06-08 DIAGNOSIS — I509 Heart failure, unspecified: Secondary | ICD-10-CM | POA: Diagnosis not present

## 2023-06-08 DIAGNOSIS — I5020 Unspecified systolic (congestive) heart failure: Secondary | ICD-10-CM | POA: Insufficient documentation

## 2023-06-08 DIAGNOSIS — I6522 Occlusion and stenosis of left carotid artery: Secondary | ICD-10-CM | POA: Diagnosis not present

## 2023-06-08 DIAGNOSIS — I110 Hypertensive heart disease with heart failure: Secondary | ICD-10-CM | POA: Diagnosis not present

## 2023-06-08 DIAGNOSIS — Z01812 Encounter for preprocedural laboratory examination: Secondary | ICD-10-CM | POA: Diagnosis not present

## 2023-06-08 DIAGNOSIS — G4733 Obstructive sleep apnea (adult) (pediatric): Secondary | ICD-10-CM | POA: Diagnosis not present

## 2023-06-08 DIAGNOSIS — I10 Essential (primary) hypertension: Secondary | ICD-10-CM

## 2023-06-08 DIAGNOSIS — I7389 Other specified peripheral vascular diseases: Secondary | ICD-10-CM

## 2023-06-08 DIAGNOSIS — I6523 Occlusion and stenosis of bilateral carotid arteries: Secondary | ICD-10-CM | POA: Diagnosis not present

## 2023-06-08 DIAGNOSIS — J449 Chronic obstructive pulmonary disease, unspecified: Secondary | ICD-10-CM

## 2023-06-08 DIAGNOSIS — I350 Nonrheumatic aortic (valve) stenosis: Secondary | ICD-10-CM

## 2023-06-08 DIAGNOSIS — E785 Hyperlipidemia, unspecified: Secondary | ICD-10-CM

## 2023-06-08 DIAGNOSIS — I5022 Chronic systolic (congestive) heart failure: Secondary | ICD-10-CM | POA: Insufficient documentation

## 2023-06-08 HISTORY — DX: Unspecified systolic (congestive) heart failure: I50.20

## 2023-06-08 HISTORY — DX: Chronic systolic (congestive) heart failure: I50.22

## 2023-06-08 NOTE — Patient Instructions (Signed)
Medication Instructions:  Your physician recommends that you continue on your current medications as directed. Please refer to the Current Medication list given to you today.   *If you need a refill on your cardiac medications before your next appointment, please call your pharmacy*  Lab Work: CBC/BEMT ONE DAY NEXT WEEK   If you have labs (blood work) drawn today and your tests are completely normal, you will receive your results only by: MyChart Message (if you have MyChart) OR A paper copy in the mail If you have any lab test that is abnormal or we need to change your treatment, we will call you to review the results.  Testing/Procedures: Your physician has requested that you have a cardiac catheterization. Cardiac catheterization is used to diagnose and/or treat various heart conditions. Doctors may recommend this procedure for a number of different reasons. The most common reason is to evaluate chest pain. Chest pain can be a symptom of coronary artery disease (CAD), and cardiac catheterization can show whether plaque is narrowing or blocking your heart's arteries. This procedure is also used to evaluate the valves, as well as measure the blood flow and oxygen levels in different parts of your heart. For further information please visit [email or web address] Please follow instruction sheet, as given.  Follow-Up: At [facility], you and your health needs are our priority.  As part of our continuing mission to provide you with exceptional heart care, we have created designated Provider Care Teams.  These Care Teams include your primary Cardiologist (physician) and Advanced Practice Providers (APPs -  Physician Assistants and Nurse Practitioners) who all work together to provide you with the care you need, when you need it.  We recommend signing up for the patient portal called "MyChart".  Sign up information is provided on this After Visit Summary.  MyChart is used to connect with  patients for Virtual Visits (Telemedicine).  Patients are able to view lab/test results, encounter notes, upcoming appointments, etc.  Non-urgent messages can be sent to your provider as well.   To learn more about what you can do with MyChart, go to [email or web address].    Your next appointment:   3 month(s)  Provider:   Chilton Si, MD or Gillian Shields, NP   You have been referred to STRUCTURAL HEART   Other Instructions  [facility] & VASCULAR AT [facility] [address] [place] [address] Dept: [phone]  Brian Hunt  06/08/2023  You are scheduled for a Cardiac Catheterization on Monday, September 30 with Dr. Alverda Skeans.  1. Please arrive at the [facility] (Main Entrance A) at [facility]: [address] [place], [place] [address] at 5:30 AM (This time is 2 hour(s) before your procedure to ensure your preparation). Free valet parking service is available. You will check in at ADMITTING. The support person will be asked to wait in the waiting room.  It is OK to have someone drop you off and come back when you are ready to be discharged.    Special note: Every effort is made to have your procedure done on time. Please understand that emergencies sometimes delay scheduled procedures.  2. Diet: Do not eat solid foods after midnight.  The patient may have clear liquids until 5am upon the day of the procedure.  3. Labs: You will need to have blood drawn NEXT WEEK   4. Medication instructions in preparation for your procedure:   Contrast Allergy: No  On the morning of your procedure, take your Aspirin 81 mg and any morning medicines NOT listed above.  You may use sips of water.  5. Plan to go home the same day, you will only stay overnight if medically necessary. 6. Bring a current list of your medications and current insurance cards. 7. You MUST have a responsible person to drive  you home. 8. Someone MUST be with you the first 24 hours after you arrive home or your discharge will be delayed. 9. Please wear clothes that are easy to get on and off and wear slip-on shoes.  Thank you for allowing [place] to care for you!   -- [facility]

## 2023-06-09 LAB — CBC WITH DIFFERENTIAL/PLATELET
Basophils Absolute: 0.1 10*3/uL (ref 0.0–0.2)
Basos: 1 %
EOS (ABSOLUTE): 0.3 10*3/uL (ref 0.0–0.4)
Eos: 5 %
Hematocrit: 42.3 % (ref 37.5–51.0)
Hemoglobin: 13.7 g/dL (ref 13.0–17.7)
Immature Grans (Abs): 0 10*3/uL (ref 0.0–0.1)
Immature Granulocytes: 0 %
Lymphocytes Absolute: 1.1 10*3/uL (ref 0.7–3.1)
Lymphs: 19 %
MCH: 32.9 pg (ref 26.6–33.0)
MCHC: 32.4 g/dL (ref 31.5–35.7)
MCV: 101 fL — ABNORMAL HIGH (ref 79–97)
Monocytes Absolute: 1 10*3/uL — ABNORMAL HIGH (ref 0.1–0.9)
Monocytes: 16 %
Neutrophils Absolute: 3.5 10*3/uL (ref 1.4–7.0)
Neutrophils: 59 %
Platelets: 284 10*3/uL (ref 150–450)
RBC: 4.17 x10E6/uL (ref 4.14–5.80)
RDW: 11.9 % (ref 11.6–15.4)
WBC: 5.9 10*3/uL (ref 3.4–10.8)

## 2023-06-09 LAB — BASIC METABOLIC PANEL
BUN/Creatinine Ratio: 19 (ref 10–24)
BUN: 17 mg/dL (ref 8–27)
CO2: 22 mmol/L (ref 20–29)
Calcium: 9.4 mg/dL (ref 8.6–10.2)
Chloride: 95 mmol/L — ABNORMAL LOW (ref 96–106)
Creatinine, Ser: 0.9 mg/dL (ref 0.76–1.27)
Glucose: 76 mg/dL (ref 70–99)
Potassium: 4.9 mmol/L (ref 3.5–5.2)
Sodium: 132 mmol/L — ABNORMAL LOW (ref 134–144)
eGFR: 89 mL/min/{1.73_m2} (ref >59)

## 2023-06-14 ENCOUNTER — Encounter: Payer: Self-pay | Admitting: Physician Assistant

## 2023-06-14 ENCOUNTER — Other Ambulatory Visit: Payer: Self-pay | Admitting: Physician Assistant

## 2023-06-14 ENCOUNTER — Telehealth: Payer: Self-pay | Admitting: *Deleted

## 2023-06-14 DIAGNOSIS — I350 Nonrheumatic aortic (valve) stenosis: Secondary | ICD-10-CM

## 2023-06-14 MED ORDER — METOPROLOL TARTRATE 50 MG PO TABS: 50.0000 mg | ORAL_TABLET | ORAL | 0 refills | Status: DC

## 2023-06-14 NOTE — Telephone Encounter (Signed)
Cardiac Catheterization scheduled at [facility] for: Monday June 18, 2023 7:30 AM Arrival time [facility] Main Entrance A at: 5:30 AM  Nothing to eat after midnight prior to procedure, clear liquids until 5 AM day of procedure.  Medication instructions: -Usual morning medications can be taken with sips of water including aspirin 81 mg.  Plan to go home the same day, you will only stay overnight if medically necessary.  You must have responsible adult to drive you home.  Someone must be with you the first 24 hours after you arrive home.  Reviewed procedure instructions with patient.

## 2023-06-18 ENCOUNTER — Encounter (HOSPITAL_COMMUNITY)
Admission: RE | Disposition: A | Discharge status: Home / Self Care | Payer: Self-pay | Source: Home / Self Care | Attending: Internal Medicine

## 2023-06-18 ENCOUNTER — Other Ambulatory Visit: Payer: Self-pay

## 2023-06-18 ENCOUNTER — Ambulatory Visit (HOSPITAL_COMMUNITY)
Admission: RE | Admit: 2023-06-18 | Discharge: 2023-06-18 | Disposition: A | Discharge status: Home / Self Care | Payer: Medicare PPO | Attending: Internal Medicine | Admitting: Internal Medicine

## 2023-06-18 DIAGNOSIS — J449 Chronic obstructive pulmonary disease, unspecified: Secondary | ICD-10-CM | POA: Diagnosis not present

## 2023-06-18 DIAGNOSIS — N400 Enlarged prostate without lower urinary tract symptoms: Secondary | ICD-10-CM | POA: Insufficient documentation

## 2023-06-18 DIAGNOSIS — I2510 Atherosclerotic heart disease of native coronary artery without angina pectoris: Secondary | ICD-10-CM | POA: Diagnosis not present

## 2023-06-18 DIAGNOSIS — E785 Hyperlipidemia, unspecified: Secondary | ICD-10-CM | POA: Insufficient documentation

## 2023-06-18 DIAGNOSIS — I6523 Occlusion and stenosis of bilateral carotid arteries: Secondary | ICD-10-CM | POA: Insufficient documentation

## 2023-06-18 DIAGNOSIS — Z79899 Other long term (current) drug therapy: Secondary | ICD-10-CM | POA: Diagnosis not present

## 2023-06-18 DIAGNOSIS — Z87891 Personal history of nicotine dependence: Secondary | ICD-10-CM | POA: Diagnosis not present

## 2023-06-18 DIAGNOSIS — I739 Peripheral vascular disease, unspecified: Secondary | ICD-10-CM | POA: Diagnosis not present

## 2023-06-18 DIAGNOSIS — I110 Hypertensive heart disease with heart failure: Secondary | ICD-10-CM | POA: Insufficient documentation

## 2023-06-18 DIAGNOSIS — Z7902 Long term (current) use of antithrombotics/antiplatelets: Secondary | ICD-10-CM | POA: Insufficient documentation

## 2023-06-18 DIAGNOSIS — I5022 Chronic systolic (congestive) heart failure: Secondary | ICD-10-CM | POA: Diagnosis not present

## 2023-06-18 DIAGNOSIS — G4733 Obstructive sleep apnea (adult) (pediatric): Secondary | ICD-10-CM | POA: Diagnosis not present

## 2023-06-18 DIAGNOSIS — I350 Nonrheumatic aortic (valve) stenosis: Secondary | ICD-10-CM | POA: Insufficient documentation

## 2023-06-18 HISTORY — PX: RIGHT/LEFT HEART CATH AND CORONARY ANGIOGRAPHY: CATH118266

## 2023-06-18 LAB — POCT I-STAT 7, (LYTES, BLD GAS, ICA,H+H)
Acid-base deficit: 3 mmol/L — ABNORMAL HIGH (ref 0.0–2.0)
Bicarbonate: 21.1 mmol/L (ref 20.0–28.0)
Calcium, Ion: 1.19 mmol/L (ref 1.15–1.40)
HCT: 38 % — ABNORMAL LOW (ref 39.0–52.0)
Hemoglobin: 12.9 g/dL — ABNORMAL LOW (ref 13.0–17.0)
O2 Saturation: 91 %
Potassium: 3.9 mmol/L (ref 3.5–5.1)
Sodium: 135 mmol/L (ref 135–145)
TCO2: 22 mmol/L (ref 22–32)
pCO2 arterial: 35.2 mm[Hg] (ref 32–48)
pH, Arterial: 7.386 (ref 7.35–7.45)
pO2, Arterial: 60 mm[Hg] — ABNORMAL LOW (ref 83–108)

## 2023-06-18 LAB — POCT I-STAT EG7
Acid-base deficit: 2 mmol/L (ref 0.0–2.0)
Acid-base deficit: 3 mmol/L — ABNORMAL HIGH (ref 0.0–2.0)
Bicarbonate: 21.8 mmol/L (ref 20.0–28.0)
Bicarbonate: 23.1 mmol/L (ref 20.0–28.0)
Calcium, Ion: 1.2 mmol/L (ref 1.15–1.40)
Calcium, Ion: 1.21 mmol/L (ref 1.15–1.40)
HCT: 37 % — ABNORMAL LOW (ref 39.0–52.0)
HCT: 38 % — ABNORMAL LOW (ref 39.0–52.0)
Hemoglobin: 12.6 g/dL — ABNORMAL LOW (ref 13.0–17.0)
Hemoglobin: 12.9 g/dL — ABNORMAL LOW (ref 13.0–17.0)
O2 Saturation: 63 %
O2 Saturation: 67 %
Potassium: 3.8 mmol/L (ref 3.5–5.1)
Potassium: 3.9 mmol/L (ref 3.5–5.1)
Sodium: 135 mmol/L (ref 135–145)
Sodium: 136 mmol/L (ref 135–145)
TCO2: 23 mmol/L (ref 22–32)
TCO2: 24 mmol/L (ref 22–32)
pCO2, Ven: 38.1 mm[Hg] — ABNORMAL LOW (ref 44–60)
pCO2, Ven: 39.4 mm[Hg] — ABNORMAL LOW (ref 44–60)
pH, Ven: 7.365 (ref 7.25–7.43)
pH, Ven: 7.377 (ref 7.25–7.43)
pO2, Ven: 34 mm[Hg] (ref 32–45)
pO2, Ven: 36 mm[Hg] (ref 32–45)

## 2023-06-18 SURGERY — RIGHT/LEFT HEART CATH AND CORONARY ANGIOGRAPHY
Anesthesia: LOCAL

## 2023-06-18 MED ORDER — SODIUM CHLORIDE 0.9 % IV SOLN: INTRAVENOUS | Status: DC

## 2023-06-18 MED ORDER — MIDAZOLAM HCL 2 MG/2ML IJ SOLN
INTRAMUSCULAR | Status: AC
  Filled 2023-06-18: qty 2

## 2023-06-18 MED ORDER — HEPARIN (PORCINE) IN NACL 1000-0.9 UT/500ML-% IV SOLN
INTRAVENOUS | Status: DC | PRN
  Administered 2023-06-18 (×2): 500 mL

## 2023-06-18 MED ORDER — IOHEXOL 350 MG/ML SOLN
INTRAVENOUS | Status: DC | PRN
  Administered 2023-06-18: 100 mL

## 2023-06-18 MED ORDER — HEPARIN SODIUM (PORCINE) 1000 UNIT/ML IJ SOLN
INTRAMUSCULAR | Status: DC | PRN
  Administered 2023-06-18: 5000 [IU] via INTRAVENOUS

## 2023-06-18 MED ORDER — HYDRALAZINE HCL 20 MG/ML IJ SOLN: 10.0000 mg | INTRAMUSCULAR | Status: DC | PRN

## 2023-06-18 MED ORDER — MIDAZOLAM HCL 2 MG/2ML IJ SOLN
INTRAMUSCULAR | Status: DC | PRN
  Administered 2023-06-18: 1 mg via INTRAVENOUS

## 2023-06-18 MED ORDER — ONDANSETRON HCL 4 MG/2ML IJ SOLN: 4.0000 mg | Freq: Four times a day (QID) | INTRAMUSCULAR | Status: DC | PRN

## 2023-06-18 MED ORDER — FENTANYL CITRATE (PF) 100 MCG/2ML IJ SOLN
INTRAMUSCULAR | Status: AC
  Filled 2023-06-18: qty 2

## 2023-06-18 MED ORDER — LABETALOL HCL 5 MG/ML IV SOLN: 10.0000 mg | INTRAVENOUS | Status: DC | PRN

## 2023-06-18 MED ORDER — VERAPAMIL HCL 2.5 MG/ML IV SOLN
INTRAVENOUS | Status: DC | PRN
  Administered 2023-06-18: 10 mL via INTRA_ARTERIAL

## 2023-06-18 MED ORDER — HEPARIN SODIUM (PORCINE) 1000 UNIT/ML IJ SOLN
INTRAMUSCULAR | Status: AC
  Filled 2023-06-18: qty 10

## 2023-06-18 MED ORDER — SODIUM CHLORIDE 0.9 % IV SOLN: 250.0000 mL | INTRAVENOUS | Status: DC | PRN

## 2023-06-18 MED ORDER — LIDOCAINE HCL (PF) 1 % IJ SOLN
INTRAMUSCULAR | Status: DC | PRN
  Administered 2023-06-18 (×2): 2 mL

## 2023-06-18 MED ORDER — VERAPAMIL HCL 2.5 MG/ML IV SOLN
INTRAVENOUS | Status: AC
  Filled 2023-06-18: qty 2

## 2023-06-18 MED ORDER — FENTANYL CITRATE (PF) 100 MCG/2ML IJ SOLN
INTRAMUSCULAR | Status: DC | PRN
  Administered 2023-06-18: 25 ug via INTRAVENOUS

## 2023-06-18 MED ORDER — LIDOCAINE HCL (PF) 1 % IJ SOLN
INTRAMUSCULAR | Status: AC
  Filled 2023-06-18: qty 30

## 2023-06-18 MED ORDER — ACETAMINOPHEN 325 MG PO TABS: 650.0000 mg | ORAL_TABLET | ORAL | Status: DC | PRN

## 2023-06-18 MED ORDER — SODIUM CHLORIDE 0.9% FLUSH: 3.0000 mL | Freq: Two times a day (BID) | INTRAVENOUS | Status: DC

## 2023-06-18 MED ORDER — SODIUM CHLORIDE 0.9% FLUSH: 3.0000 mL | INTRAVENOUS | Status: DC | PRN

## 2023-06-18 SURGICAL SUPPLY — 16 items
CATH 5FR JL3.5 JR4 ANG PIG MP (CATHETERS) IMPLANT
CATH BALLN WEDGE 5F 110CM (CATHETERS) IMPLANT
CATH DIAG 6FR PIGTAIL ANGLED (CATHETERS) IMPLANT
DEVICE RAD COMP TR BAND LRG (VASCULAR PRODUCTS) IMPLANT
ELECT DEFIB PAD ADLT CADENCE (PAD) IMPLANT
GLIDESHEATH SLEND SS 6F .021 (SHEATH) IMPLANT
GUIDEWIRE .025 260CM (WIRE) IMPLANT
GUIDEWIRE INQWIRE 1.5J.035X260 (WIRE) IMPLANT
INQWIRE 1.5J .035X260CM (WIRE) ×1
KIT SINGLE USE MANIFOLD (KITS) IMPLANT
KIT SYRINGE INJ CVI SPIKEX1 (MISCELLANEOUS) IMPLANT
PACK CARDIAC CATHETERIZATION (CUSTOM PROCEDURE TRAY) ×1 IMPLANT
PROTECTION STATION PRESSURIZED (MISCELLANEOUS) ×1
SHEATH GLIDE SLENDER 4/5FR (SHEATH) IMPLANT
SHEATH PROBE COVER 6X72 (BAG) IMPLANT
STATION PROTECTION PRESSURIZED (MISCELLANEOUS) IMPLANT

## 2023-06-18 NOTE — Progress Notes (Signed)
Patient and wife was given discharge instructions, Both verbalized understanding.

## 2023-06-18 NOTE — Discharge Instructions (Signed)

## 2023-06-18 NOTE — Interval H&P Note (Signed)
History and Physical Interval Note:  06/18/2023 6:49 AM  Brian Hunt  has presented today for surgery, with the diagnosis of Heart Failure, Aortic Stenosis.  The various methods of treatment have been discussed with the patient and family. After consideration of risks, benefits and other options for treatment, the patient has consented to  Procedure(s): RIGHT/LEFT HEART CATH AND CORONARY ANGIOGRAPHY (N/A) as a surgical intervention.  The patient's history has been reviewed, patient examined, no change in status, stable for surgery.  I have reviewed the patient's chart and labs.  Questions were answered to the patient's satisfaction.     [name]

## 2023-06-19 ENCOUNTER — Encounter (HOSPITAL_COMMUNITY): Payer: Self-pay | Admitting: Internal Medicine

## 2023-06-19 ENCOUNTER — Telehealth: Payer: Self-pay | Admitting: Internal Medicine

## 2023-06-19 NOTE — Telephone Encounter (Signed)
   Pt c/o of Chest Pain: STAT if active CP, including tightness, pressure, jaw pain, radiating pain to shoulder/upper arm/back, CP unrelieved by Nitro. Symptoms reported of SOB, nausea, vomiting, sweating.  1. Are you having CP right now? Yes; had catherization yesterday    2. Are you experiencing any other symptoms (ex. SOB, nausea, vomiting, sweating)? SOB; labored breathing   3. Is your CP continuous or coming and going? Coming and going   4. Have you taken Nitroglycerin? No    5. How long have you been experiencing CP? Since yesterday    6. If NO CP at time of call then end call with telling Pt to call back or call 911 if Chest pain returns prior to return call from triage team.

## 2023-06-19 NOTE — Progress Notes (Unsigned)
Patient ID: Brian Hunt MRN: [id number] DOB/AGE: November 16, 1945 77 y.o.  Primary Care Physician:Jones, Bernadene Bell, MD Primary Cardiologist: Chilton Si, MD  FOCUSED CARDIOVASCULAR PROBLEM LIST:   1.  Severe aortic stenosis and aortic valve area of 0.79 cm grade, mean gradient 41 mmHg, peak velocity of 3.9 m/s with ejection fraction of 45 to 50%; EKG demonstrates sinus rhythm with right bundle branch block 2.  Bilateral moderate carotid disease without a history of stroke 3.  Hyperlipidemia 4.  Hypertension 5.  Peripheral vascular disease with abnormal ABIs currently on Pletal 6.  OSA on CPAP  HISTORY OF PRESENT ILLNESS: The patient is a 77 y.o. male with the indicated medical history here for recommendations regarding his severe symptomatic aortic stenosis.  Patient had last seen Dr. Duke Salvia in January 2023.  At that point in time the patient was doing well.  He had been followed for moderate aortic stenosis for some time.  Echocardiogram in January of last year demonstrated worsening aortic stenosis in the moderate to severe range.  A repeat echocardiogram in September that I personally reviewed demonstrated a mildly reduced ejection fraction of 45 to 50% with concentric left ventricular hypertrophy and now mean gradient of 41 mmHg.  Patient underwent coronary angiography and right heart catheterization recently which demonstrated mild nonobstructive disease, preserved cardiac output and index, with a wedge pressure of 16 mmHg and a right atrial pressure of 8 mmHg.  He is here to discuss these issues.  He is here with his wife.  Once he received the diagnosis of aortic stenosis he has become somewhat anxious.  He was instructed not to do much and he has complied with this.  He is somewhat limited by peripheral vascular disease but he tells me not as much as 1 would expect.  He plays golf and exercises on a bike.  He has not done this since his diagnosis of aortic stenosis.  He is not sure  whether he is short of breath or not because he has not really tried to do anything.  He occasionally gets lightheaded when he goes from sitting to standing but this has been a longstanding issue since he has been on blood pressure medications.  This has not been worsening.  He denies any exertional angina however yesterday he felt quite anxious and was somewhat short of breath and developed some chest discomfort which resolved on its own.  He has fortunately not required any emergency room visits or hospitalizations for breathing issues.  He sees a Education officer, community on a regular basis and reports good dental health.  His claudication is somewhat controlled with Pletal and supervised exercise program.  He is very happy with this.   Past Medical History:  Diagnosis Date   Anemia    Anxiety    Aortic stenosis, severe 03/09/2018              Reading Physician    Reading Date    Result Priority      Quintella Reichert, MD [phone]    07/06/2021    Routine     Narrative & Impression       The study is normal. The study is low risk.    RBBB with repolarization changes was present at baseline with no ST changes from baseline during infusion.  Occsaional PVCs were noted.    LV perfusion is normal. There is no evidence of is   Arthritis    Basal cell carcinoma    Basal Cell X2 ,  67.0 in Accession #:    [id number]     Weight:       162.0 lb Date of Birth:  Apr 07, 1946      BSA:          1.849 m Patient Age:    76 years       BP:           132/86 mmHg Patient Gender: M              HR:           61 bpm. Exam Location:  Outpatient  Procedure: 2D Echo, 3D Echo, Color Doppler, Cardiac Doppler and Strain Analysis  Indications:    Aortic Stenosis  History:        Patient has prior history of Echocardiogram examinations, most recent 10/07/2022. PAD and COPD; Risk Factors:Hypertension, Dyslipidemia and Former Smoker. Chroic renal disease.  Sonographer:    Jeryl Columbia RDCS Referring Phys: [id number] [name]  IMPRESSIONS   1. Left ventricular ejection fraction, by  estimation, is 45 to 50%. Left ventricular ejection fraction by 3D volume is 48 %. The left ventricle has mildly decreased function. The left ventricle demonstrates global hypokinesis. There is mild concentric left ventricular hypertrophy. Left ventricular diastolic function could not be evaluated. 2. Right ventricular systolic function is normal. The right ventricular size is normal. 3. Left atrial size was moderately dilated. 4. Right atrial size was severely dilated. 5. The mitral valve is degenerative. Trivial mitral valve regurgitation. No evidence of mitral stenosis. The mean mitral valve gradient is 4.0 mmHg. Moderate to severe mitral annular calcification. 6. The aortic valve is calcified. There is moderate calcification of the aortic valve. There is severe thickening of the aortic valve. Aortic valve regurgitation is mild. Severe aortic valve stenosis. Aortic valve area, by VTI measures 0.79 cm. Aortic valve mean gradient measures 41.0 mmHg. Aortic valve Vmax measures 3.90 m/s. 7. The inferior vena cava is normal in size with greater than 50% respiratory variability, suggesting right atrial pressure of 3 mmHg.  FINDINGS Left Ventricle: Left ventricular ejection fraction, by estimation, is 45 to 50%. Left ventricular ejection fraction by 3D volume is 48 %. The left ventricle has mildly decreased function. The left ventricle demonstrates global hypokinesis. Global longitudinal strain performed but not reported based on interpreter judgement due to suboptimal tracking. The left ventricular internal cavity size was normal in size. There is mild concentric left ventricular hypertrophy. Left ventricular diastolic function could not be evaluated due to mitral annular calcification (moderate or greater). Left ventricular diastolic function could not be evaluated.  Right Ventricle: The right ventricular size is normal. No increase in right ventricular wall thickness. Right ventricular systolic  function is normal.  Left Atrium: Left atrial size was moderately dilated.  Right Atrium: Right atrial size was severely dilated.  Pericardium: There is no evidence of pericardial effusion. Presence of epicardial fat layer.  Mitral Valve: The mitral valve is degenerative in appearance. Moderate to severe mitral annular calcification. Trivial mitral valve regurgitation. No evidence of mitral valve stenosis. MV peak gradient, 14.1 mmHg. The mean mitral valve gradient is 4.0 mmHg.  Tricuspid Valve: The tricuspid valve is normal in structure. Tricuspid valve regurgitation is not demonstrated. No evidence of tricuspid stenosis.  Aortic Valve: The aortic valve is calcified. There is moderate calcification of the aortic valve. There is severe thickening of the aortic valve. There is moderate to severe aortic valve annular calcification. Aortic valve regurgitation is mild. Aortic regurgitation PHT measures  Patient ID: Brian Hunt MRN: [id number] DOB/AGE: November 16, 1945 77 y.o.  Primary Care Physician:Jones, Bernadene Bell, MD Primary Cardiologist: Chilton Si, MD  FOCUSED CARDIOVASCULAR PROBLEM LIST:   1.  Severe aortic stenosis and aortic valve area of 0.79 cm grade, mean gradient 41 mmHg, peak velocity of 3.9 m/s with ejection fraction of 45 to 50%; EKG demonstrates sinus rhythm with right bundle branch block 2.  Bilateral moderate carotid disease without a history of stroke 3.  Hyperlipidemia 4.  Hypertension 5.  Peripheral vascular disease with abnormal ABIs currently on Pletal 6.  OSA on CPAP  HISTORY OF PRESENT ILLNESS: The patient is a 77 y.o. male with the indicated medical history here for recommendations regarding his severe symptomatic aortic stenosis.  Patient had last seen Dr. Duke Salvia in January 2023.  At that point in time the patient was doing well.  He had been followed for moderate aortic stenosis for some time.  Echocardiogram in January of last year demonstrated worsening aortic stenosis in the moderate to severe range.  A repeat echocardiogram in September that I personally reviewed demonstrated a mildly reduced ejection fraction of 45 to 50% with concentric left ventricular hypertrophy and now mean gradient of 41 mmHg.  Patient underwent coronary angiography and right heart catheterization recently which demonstrated mild nonobstructive disease, preserved cardiac output and index, with a wedge pressure of 16 mmHg and a right atrial pressure of 8 mmHg.  He is here to discuss these issues.  He is here with his wife.  Once he received the diagnosis of aortic stenosis he has become somewhat anxious.  He was instructed not to do much and he has complied with this.  He is somewhat limited by peripheral vascular disease but he tells me not as much as 1 would expect.  He plays golf and exercises on a bike.  He has not done this since his diagnosis of aortic stenosis.  He is not sure  whether he is short of breath or not because he has not really tried to do anything.  He occasionally gets lightheaded when he goes from sitting to standing but this has been a longstanding issue since he has been on blood pressure medications.  This has not been worsening.  He denies any exertional angina however yesterday he felt quite anxious and was somewhat short of breath and developed some chest discomfort which resolved on its own.  He has fortunately not required any emergency room visits or hospitalizations for breathing issues.  He sees a Education officer, community on a regular basis and reports good dental health.  His claudication is somewhat controlled with Pletal and supervised exercise program.  He is very happy with this.   Past Medical History:  Diagnosis Date   Anemia    Anxiety    Aortic stenosis, severe 03/09/2018              Reading Physician    Reading Date    Result Priority      Quintella Reichert, MD [phone]    07/06/2021    Routine     Narrative & Impression       The study is normal. The study is low risk.    RBBB with repolarization changes was present at baseline with no ST changes from baseline during infusion.  Occsaional PVCs were noted.    LV perfusion is normal. There is no evidence of is   Arthritis    Basal cell carcinoma    Basal Cell X2 ,  67.0 in Accession #:    [id number]     Weight:       162.0 lb Date of Birth:  Apr 07, 1946      BSA:          1.849 m Patient Age:    76 years       BP:           132/86 mmHg Patient Gender: M              HR:           61 bpm. Exam Location:  Outpatient  Procedure: 2D Echo, 3D Echo, Color Doppler, Cardiac Doppler and Strain Analysis  Indications:    Aortic Stenosis  History:        Patient has prior history of Echocardiogram examinations, most recent 10/07/2022. PAD and COPD; Risk Factors:Hypertension, Dyslipidemia and Former Smoker. Chroic renal disease.  Sonographer:    Jeryl Columbia RDCS Referring Phys: [id number] [name]  IMPRESSIONS   1. Left ventricular ejection fraction, by  estimation, is 45 to 50%. Left ventricular ejection fraction by 3D volume is 48 %. The left ventricle has mildly decreased function. The left ventricle demonstrates global hypokinesis. There is mild concentric left ventricular hypertrophy. Left ventricular diastolic function could not be evaluated. 2. Right ventricular systolic function is normal. The right ventricular size is normal. 3. Left atrial size was moderately dilated. 4. Right atrial size was severely dilated. 5. The mitral valve is degenerative. Trivial mitral valve regurgitation. No evidence of mitral stenosis. The mean mitral valve gradient is 4.0 mmHg. Moderate to severe mitral annular calcification. 6. The aortic valve is calcified. There is moderate calcification of the aortic valve. There is severe thickening of the aortic valve. Aortic valve regurgitation is mild. Severe aortic valve stenosis. Aortic valve area, by VTI measures 0.79 cm. Aortic valve mean gradient measures 41.0 mmHg. Aortic valve Vmax measures 3.90 m/s. 7. The inferior vena cava is normal in size with greater than 50% respiratory variability, suggesting right atrial pressure of 3 mmHg.  FINDINGS Left Ventricle: Left ventricular ejection fraction, by estimation, is 45 to 50%. Left ventricular ejection fraction by 3D volume is 48 %. The left ventricle has mildly decreased function. The left ventricle demonstrates global hypokinesis. Global longitudinal strain performed but not reported based on interpreter judgement due to suboptimal tracking. The left ventricular internal cavity size was normal in size. There is mild concentric left ventricular hypertrophy. Left ventricular diastolic function could not be evaluated due to mitral annular calcification (moderate or greater). Left ventricular diastolic function could not be evaluated.  Right Ventricle: The right ventricular size is normal. No increase in right ventricular wall thickness. Right ventricular systolic  function is normal.  Left Atrium: Left atrial size was moderately dilated.  Right Atrium: Right atrial size was severely dilated.  Pericardium: There is no evidence of pericardial effusion. Presence of epicardial fat layer.  Mitral Valve: The mitral valve is degenerative in appearance. Moderate to severe mitral annular calcification. Trivial mitral valve regurgitation. No evidence of mitral valve stenosis. MV peak gradient, 14.1 mmHg. The mean mitral valve gradient is 4.0 mmHg.  Tricuspid Valve: The tricuspid valve is normal in structure. Tricuspid valve regurgitation is not demonstrated. No evidence of tricuspid stenosis.  Aortic Valve: The aortic valve is calcified. There is moderate calcification of the aortic valve. There is severe thickening of the aortic valve. There is moderate to severe aortic valve annular calcification. Aortic valve regurgitation is mild. Aortic regurgitation PHT measures  Patient ID: Brian Hunt MRN: [id number] DOB/AGE: November 16, 1945 77 y.o.  Primary Care Physician:Jones, Bernadene Bell, MD Primary Cardiologist: Chilton Si, MD  FOCUSED CARDIOVASCULAR PROBLEM LIST:   1.  Severe aortic stenosis and aortic valve area of 0.79 cm grade, mean gradient 41 mmHg, peak velocity of 3.9 m/s with ejection fraction of 45 to 50%; EKG demonstrates sinus rhythm with right bundle branch block 2.  Bilateral moderate carotid disease without a history of stroke 3.  Hyperlipidemia 4.  Hypertension 5.  Peripheral vascular disease with abnormal ABIs currently on Pletal 6.  OSA on CPAP  HISTORY OF PRESENT ILLNESS: The patient is a 77 y.o. male with the indicated medical history here for recommendations regarding his severe symptomatic aortic stenosis.  Patient had last seen Dr. Duke Salvia in January 2023.  At that point in time the patient was doing well.  He had been followed for moderate aortic stenosis for some time.  Echocardiogram in January of last year demonstrated worsening aortic stenosis in the moderate to severe range.  A repeat echocardiogram in September that I personally reviewed demonstrated a mildly reduced ejection fraction of 45 to 50% with concentric left ventricular hypertrophy and now mean gradient of 41 mmHg.  Patient underwent coronary angiography and right heart catheterization recently which demonstrated mild nonobstructive disease, preserved cardiac output and index, with a wedge pressure of 16 mmHg and a right atrial pressure of 8 mmHg.  He is here to discuss these issues.  He is here with his wife.  Once he received the diagnosis of aortic stenosis he has become somewhat anxious.  He was instructed not to do much and he has complied with this.  He is somewhat limited by peripheral vascular disease but he tells me not as much as 1 would expect.  He plays golf and exercises on a bike.  He has not done this since his diagnosis of aortic stenosis.  He is not sure  whether he is short of breath or not because he has not really tried to do anything.  He occasionally gets lightheaded when he goes from sitting to standing but this has been a longstanding issue since he has been on blood pressure medications.  This has not been worsening.  He denies any exertional angina however yesterday he felt quite anxious and was somewhat short of breath and developed some chest discomfort which resolved on its own.  He has fortunately not required any emergency room visits or hospitalizations for breathing issues.  He sees a Education officer, community on a regular basis and reports good dental health.  His claudication is somewhat controlled with Pletal and supervised exercise program.  He is very happy with this.   Past Medical History:  Diagnosis Date   Anemia    Anxiety    Aortic stenosis, severe 03/09/2018              Reading Physician    Reading Date    Result Priority      Quintella Reichert, MD [phone]    07/06/2021    Routine     Narrative & Impression       The study is normal. The study is low risk.    RBBB with repolarization changes was present at baseline with no ST changes from baseline during infusion.  Occsaional PVCs were noted.    LV perfusion is normal. There is no evidence of is   Arthritis    Basal cell carcinoma    Basal Cell X2 ,  Patient ID: Brian Hunt MRN: [id number] DOB/AGE: November 16, 1945 77 y.o.  Primary Care Physician:Jones, Bernadene Bell, MD Primary Cardiologist: Chilton Si, MD  FOCUSED CARDIOVASCULAR PROBLEM LIST:   1.  Severe aortic stenosis and aortic valve area of 0.79 cm grade, mean gradient 41 mmHg, peak velocity of 3.9 m/s with ejection fraction of 45 to 50%; EKG demonstrates sinus rhythm with right bundle branch block 2.  Bilateral moderate carotid disease without a history of stroke 3.  Hyperlipidemia 4.  Hypertension 5.  Peripheral vascular disease with abnormal ABIs currently on Pletal 6.  OSA on CPAP  HISTORY OF PRESENT ILLNESS: The patient is a 77 y.o. male with the indicated medical history here for recommendations regarding his severe symptomatic aortic stenosis.  Patient had last seen Dr. Duke Salvia in January 2023.  At that point in time the patient was doing well.  He had been followed for moderate aortic stenosis for some time.  Echocardiogram in January of last year demonstrated worsening aortic stenosis in the moderate to severe range.  A repeat echocardiogram in September that I personally reviewed demonstrated a mildly reduced ejection fraction of 45 to 50% with concentric left ventricular hypertrophy and now mean gradient of 41 mmHg.  Patient underwent coronary angiography and right heart catheterization recently which demonstrated mild nonobstructive disease, preserved cardiac output and index, with a wedge pressure of 16 mmHg and a right atrial pressure of 8 mmHg.  He is here to discuss these issues.  He is here with his wife.  Once he received the diagnosis of aortic stenosis he has become somewhat anxious.  He was instructed not to do much and he has complied with this.  He is somewhat limited by peripheral vascular disease but he tells me not as much as 1 would expect.  He plays golf and exercises on a bike.  He has not done this since his diagnosis of aortic stenosis.  He is not sure  whether he is short of breath or not because he has not really tried to do anything.  He occasionally gets lightheaded when he goes from sitting to standing but this has been a longstanding issue since he has been on blood pressure medications.  This has not been worsening.  He denies any exertional angina however yesterday he felt quite anxious and was somewhat short of breath and developed some chest discomfort which resolved on its own.  He has fortunately not required any emergency room visits or hospitalizations for breathing issues.  He sees a Education officer, community on a regular basis and reports good dental health.  His claudication is somewhat controlled with Pletal and supervised exercise program.  He is very happy with this.   Past Medical History:  Diagnosis Date   Anemia    Anxiety    Aortic stenosis, severe 03/09/2018              Reading Physician    Reading Date    Result Priority      Quintella Reichert, MD [phone]    07/06/2021    Routine     Narrative & Impression       The study is normal. The study is low risk.    RBBB with repolarization changes was present at baseline with no ST changes from baseline during infusion.  Occsaional PVCs were noted.    LV perfusion is normal. There is no evidence of is   Arthritis    Basal cell carcinoma    Basal Cell X2 ,  Patient ID: Brian Hunt MRN: [id number] DOB/AGE: November 16, 1945 77 y.o.  Primary Care Physician:Jones, Bernadene Bell, MD Primary Cardiologist: Chilton Si, MD  FOCUSED CARDIOVASCULAR PROBLEM LIST:   1.  Severe aortic stenosis and aortic valve area of 0.79 cm grade, mean gradient 41 mmHg, peak velocity of 3.9 m/s with ejection fraction of 45 to 50%; EKG demonstrates sinus rhythm with right bundle branch block 2.  Bilateral moderate carotid disease without a history of stroke 3.  Hyperlipidemia 4.  Hypertension 5.  Peripheral vascular disease with abnormal ABIs currently on Pletal 6.  OSA on CPAP  HISTORY OF PRESENT ILLNESS: The patient is a 77 y.o. male with the indicated medical history here for recommendations regarding his severe symptomatic aortic stenosis.  Patient had last seen Dr. Duke Salvia in January 2023.  At that point in time the patient was doing well.  He had been followed for moderate aortic stenosis for some time.  Echocardiogram in January of last year demonstrated worsening aortic stenosis in the moderate to severe range.  A repeat echocardiogram in September that I personally reviewed demonstrated a mildly reduced ejection fraction of 45 to 50% with concentric left ventricular hypertrophy and now mean gradient of 41 mmHg.  Patient underwent coronary angiography and right heart catheterization recently which demonstrated mild nonobstructive disease, preserved cardiac output and index, with a wedge pressure of 16 mmHg and a right atrial pressure of 8 mmHg.  He is here to discuss these issues.  He is here with his wife.  Once he received the diagnosis of aortic stenosis he has become somewhat anxious.  He was instructed not to do much and he has complied with this.  He is somewhat limited by peripheral vascular disease but he tells me not as much as 1 would expect.  He plays golf and exercises on a bike.  He has not done this since his diagnosis of aortic stenosis.  He is not sure  whether he is short of breath or not because he has not really tried to do anything.  He occasionally gets lightheaded when he goes from sitting to standing but this has been a longstanding issue since he has been on blood pressure medications.  This has not been worsening.  He denies any exertional angina however yesterday he felt quite anxious and was somewhat short of breath and developed some chest discomfort which resolved on its own.  He has fortunately not required any emergency room visits or hospitalizations for breathing issues.  He sees a Education officer, community on a regular basis and reports good dental health.  His claudication is somewhat controlled with Pletal and supervised exercise program.  He is very happy with this.   Past Medical History:  Diagnosis Date   Anemia    Anxiety    Aortic stenosis, severe 03/09/2018              Reading Physician    Reading Date    Result Priority      Quintella Reichert, MD [phone]    07/06/2021    Routine     Narrative & Impression       The study is normal. The study is low risk.    RBBB with repolarization changes was present at baseline with no ST changes from baseline during infusion.  Occsaional PVCs were noted.    LV perfusion is normal. There is no evidence of is   Arthritis    Basal cell carcinoma    Basal Cell X2 ,

## 2023-06-19 NOTE — Telephone Encounter (Signed)
The patient called into the structural heart team in regards to chest tightness and wanting to know if he can proceed with ophthalmology appointment this afternoon. In reviewing the pt's chart he spoke with Brian Horn RN earlier today.  I advised him that severe AS can cause symptoms of chest tightness. This sensation is mild at this time and the patient is also anxious about his evaluation and need for heart surgery.  The pt will continue to monitor symptoms and contact the office if symptoms worsen.  The patient is scheduled for TAVR consult tomorrow with Dr Brian Hunt. I also advised the patient that he can proceed with eye appointment as scheduled.

## 2023-06-19 NOTE — Telephone Encounter (Signed)
Pt calling in w/ chest tightness, but not sharp/painful. If he had to place a number for it, it would be a 1 or 2. HR 90 O2 97 He has a routine eye exam w/ dilation this afternoon and making sure ok to proceed. Aware that this should not be an issue if pt feels ok. Will further discuss tightness w/ MD to ensure pt does not need to return to ED. Pt aware I will call back.

## 2023-06-19 NOTE — Telephone Encounter (Signed)
Spoke to pt about an hour ago. Advised he may proceed w/ eye appt if he wishes. Advised to keep his appt tomorrow to further discuss valve.  Educated this may be the cause of chest tightness. Advised to call office if symptoms worsen and/or go to ED if necessary. Patient verbalized understanding and agreeable to plan.

## 2023-06-20 ENCOUNTER — Ambulatory Visit: Payer: Medicare PPO | Attending: Internal Medicine | Admitting: Internal Medicine

## 2023-06-20 ENCOUNTER — Encounter: Payer: Self-pay | Admitting: Internal Medicine

## 2023-06-20 VITALS — BP 130/76 | HR 72 | Ht 67.0 in | Wt 164.0 lb

## 2023-06-20 DIAGNOSIS — I350 Nonrheumatic aortic (valve) stenosis: Secondary | ICD-10-CM | POA: Diagnosis not present

## 2023-06-20 DIAGNOSIS — I6523 Occlusion and stenosis of bilateral carotid arteries: Secondary | ICD-10-CM | POA: Diagnosis not present

## 2023-06-20 DIAGNOSIS — I10 Essential (primary) hypertension: Secondary | ICD-10-CM | POA: Diagnosis not present

## 2023-06-20 DIAGNOSIS — I739 Peripheral vascular disease, unspecified: Secondary | ICD-10-CM

## 2023-06-20 DIAGNOSIS — E785 Hyperlipidemia, unspecified: Secondary | ICD-10-CM | POA: Diagnosis not present

## 2023-06-20 DIAGNOSIS — I428 Other cardiomyopathies: Secondary | ICD-10-CM

## 2023-06-20 NOTE — Patient Instructions (Signed)
Medication Instructions:  Your physician recommends that you continue on your current medications as directed. Please refer to the Current Medication list given to you today.  *If you need a refill on your cardiac medications before your next appointment, please call your pharmacy*   Lab Work: None ordered today.  Testing/Procedures: None ordered today.  Follow-Up: Pending CT scan results.

## 2023-06-20 NOTE — Progress Notes (Addendum)
Pre Surgical Assessment: 5 M Walk Test  30M=16.81ft  5 Meter Walk Test- trial 1: 4.67 seconds 5 Meter Walk Test- trial 2: 4.55 seconds 5 Meter Walk Test- trial 3: 4.94 seconds 5 Meter Walk Test Average: 4.72 seconds    ________________________  Procedure Type: Isolated AVR Perioperative Outcome Estimate % Operative Mortality 1.5% Morbidity & Mortality 5.28% Stroke 0.817% Renal Failure 0.765% Reoperation 3.25% Prolonged Ventilation 2.24% Deep Sternal Wound Infection 0.051% Long Hospital Stay (>14 days) 2.57% Short Hospital Stay (<6 days)* 56.5%

## 2023-06-28 DIAGNOSIS — G4733 Obstructive sleep apnea (adult) (pediatric): Secondary | ICD-10-CM | POA: Diagnosis not present

## 2023-06-29 ENCOUNTER — Ambulatory Visit (INDEPENDENT_AMBULATORY_CARE_PROVIDER_SITE_OTHER): Payer: Medicare PPO | Admitting: Otolaryngology

## 2023-07-02 ENCOUNTER — Ambulatory Visit (HOSPITAL_COMMUNITY)
Admission: RE | Admit: 2023-07-02 | Discharge: 2023-07-02 | Disposition: A | Discharge status: Home / Self Care | Payer: Medicare PPO | Source: Ambulatory Visit | Attending: Internal Medicine | Admitting: Internal Medicine

## 2023-07-02 DIAGNOSIS — K5730 Diverticulosis of large intestine without perforation or abscess without bleeding: Secondary | ICD-10-CM | POA: Diagnosis not present

## 2023-07-02 DIAGNOSIS — Z01818 Encounter for other preprocedural examination: Secondary | ICD-10-CM | POA: Diagnosis not present

## 2023-07-02 DIAGNOSIS — R918 Other nonspecific abnormal finding of lung field: Secondary | ICD-10-CM | POA: Diagnosis not present

## 2023-07-02 DIAGNOSIS — I350 Nonrheumatic aortic (valve) stenosis: Secondary | ICD-10-CM | POA: Diagnosis not present

## 2023-07-02 DIAGNOSIS — I700 Atherosclerosis of aorta: Secondary | ICD-10-CM | POA: Diagnosis not present

## 2023-07-02 MED ORDER — IOHEXOL 350 MG/ML SOLN
75.0000 mL | Freq: Once | INTRAVENOUS | Status: AC | PRN
  Administered 2023-07-02: 75 mL via INTRAVENOUS

## 2023-07-03 ENCOUNTER — Other Ambulatory Visit: Payer: Self-pay

## 2023-07-03 DIAGNOSIS — I350 Nonrheumatic aortic (valve) stenosis: Secondary | ICD-10-CM

## 2023-07-04 ENCOUNTER — Institutional Professional Consult (permissible substitution): Payer: Medicare PPO | Admitting: Surgery

## 2023-07-04 ENCOUNTER — Encounter: Payer: Self-pay | Admitting: Surgery

## 2023-07-04 VITALS — BP 128/71 | HR 93 | Resp 20 | Ht 67.0 in | Wt 164.0 lb

## 2023-07-04 DIAGNOSIS — I350 Nonrheumatic aortic (valve) stenosis: Secondary | ICD-10-CM

## 2023-07-04 NOTE — Progress Notes (Unsigned)
Patient ID: Brian Hunt, male   DOB: [birth year], 77 y.o.   MRN: [id number]  [facility]       [address]       [place] [phone]             [phone]          CARDIOTHORACIC SURGERY CONSULTATION REPORT  PCP is Etta Grandchild, MD Referring Provider is Alverda Skeans, MD Primary Cardiologist is Chilton Si, MD  Reason for consultation: Severe aortic stenosis  HPI:  The patient is a 77 year old gentleman with a history of hypertension, hyperlipidemia, COPD with active smoking, OSA on CPAP since April 2024, peripheral arterial disease, carotid artery disease, degenerative arthritis 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  His most recent echo on 05/31/2023 showed a mean gradient across aortic valve of 41 mmHg with a peak of 61 mmHg and a valve area by VTI of 0.79 cm. The aortic valve was severely calcified and thickened with restricted leaflet mobility.  Left ventricular ejection fraction was mildly reduced at 45 to 50%.  Previous echocardiogram in January 2024 showed a mean gradient of 35 mmHg.  Ejection fraction at that time was 70%.  He reports fatigue and shortness of breath with exertion but has continued to go to the gym and exercise.  He lifts weights and uses resistance bands and does cardio.  He has continued to play golf and rides an exercise bicycle.  He has been somewhat limited due to his lower extremity claudication.  He has had occasional episodes of lightheadedness when standing up which he relates to his blood pressure medications.  He denies any syncope.  He denies any chest pain or pressure.  He is here today with his wife.  He continues to smoke cigarettes.  Past Medical History:  Diagnosis Date   Anemia    Anxiety    Aortic stenosis, severe 03/09/2018              Reading Physician     Reading Date    Result Priority      Quintella Reichert, MD [phone]    07/06/2021    Routine     Narrative & Impression       The study is normal. The study is low risk.    RBBB with repolarization changes was present at baseline with no ST changes from baseline during infusion.  Occsaional PVCs were noted.    LV perfusion is normal. There is no evidence of is   Arthritis    Basal cell carcinoma    Basal Cell X2 , Dr Mayford Knife   BPH (benign prostatic hyperplasia)    Bursitis of left hip    Dr. 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   MRN: [id number]  [facility]       [address]       [place] [phone]             [phone]          CARDIOTHORACIC SURGERY CONSULTATION REPORT  PCP is Etta Grandchild, MD Referring Provider is Alverda Skeans, MD Primary Cardiologist is Chilton Si, MD  Reason for consultation: Severe aortic stenosis  HPI:  The patient is a 77 year old gentleman with a history of hypertension, hyperlipidemia, COPD with active smoking, OSA on CPAP since April 2024, peripheral arterial disease, carotid artery disease, degenerative arthritis 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  His most recent echo on 05/31/2023 showed a mean gradient across aortic valve of 41 mmHg with a peak of 61 mmHg and a valve area by VTI of 0.79 cm. The aortic valve was severely calcified and thickened with restricted leaflet mobility.  Left ventricular ejection fraction was mildly reduced at 45 to 50%.  Previous echocardiogram in January 2024 showed a mean gradient of 35 mmHg.  Ejection fraction at that time was 70%.  He reports fatigue and shortness of breath with exertion but has continued to go to the gym and exercise.  He lifts weights and uses resistance bands and does cardio.  He has continued to play golf and rides an exercise bicycle.  He has been somewhat limited due to his lower extremity claudication.  He has had occasional episodes of lightheadedness when standing up which he relates to his blood pressure medications.  He denies any syncope.  He denies any chest pain or pressure.  He is here today with his wife.  He continues to smoke cigarettes.  Past Medical History:  Diagnosis Date   Anemia    Anxiety    Aortic stenosis, severe 03/09/2018              Reading Physician     Reading Date    Result Priority      Quintella Reichert, MD [phone]    07/06/2021    Routine     Narrative & Impression       The study is normal. The study is low risk.    RBBB with repolarization changes was present at baseline with no ST changes from baseline during infusion.  Occsaional PVCs were noted.    LV perfusion is normal. There is no evidence of is   Arthritis    Basal cell carcinoma    Basal Cell X2 , Dr Mayford Knife   BPH (benign prostatic hyperplasia)    Bursitis of left hip    Dr. 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   MRN: [id number]  [facility]       [address]       [place] [phone]             [phone]          CARDIOTHORACIC SURGERY CONSULTATION REPORT  PCP is Etta Grandchild, MD Referring Provider is Alverda Skeans, MD Primary Cardiologist is Chilton Si, MD  Reason for consultation: Severe aortic stenosis  HPI:  The patient is a 77 year old gentleman with a history of hypertension, hyperlipidemia, COPD with active smoking, OSA on CPAP since April 2024, peripheral arterial disease, carotid artery disease, degenerative arthritis 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  His most recent echo on 05/31/2023 showed a mean gradient across aortic valve of 41 mmHg with a peak of 61 mmHg and a valve area by VTI of 0.79 cm. The aortic valve was severely calcified and thickened with restricted leaflet mobility.  Left ventricular ejection fraction was mildly reduced at 45 to 50%.  Previous echocardiogram in January 2024 showed a mean gradient of 35 mmHg.  Ejection fraction at that time was 70%.  He reports fatigue and shortness of breath with exertion but has continued to go to the gym and exercise.  He lifts weights and uses resistance bands and does cardio.  He has continued to play golf and rides an exercise bicycle.  He has been somewhat limited due to his lower extremity claudication.  He has had occasional episodes of lightheadedness when standing up which he relates to his blood pressure medications.  He denies any syncope.  He denies any chest pain or pressure.  He is here today with his wife.  He continues to smoke cigarettes.  Past Medical History:  Diagnosis Date   Anemia    Anxiety    Aortic stenosis, severe 03/09/2018              Reading Physician     Reading Date    Result Priority      Quintella Reichert, MD [phone]    07/06/2021    Routine     Narrative & Impression       The study is normal. The study is low risk.    RBBB with repolarization changes was present at baseline with no ST changes from baseline during infusion.  Occsaional PVCs were noted.    LV perfusion is normal. There is no evidence of is   Arthritis    Basal cell carcinoma    Basal Cell X2 , Dr Mayford Knife   BPH (benign prostatic hyperplasia)    Bursitis of left hip    Dr. 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   Heart murmur    mild AI/AS 06/30/21 echo   HFrEF (heart failure with reduced ejection fraction) (HCC) 06/08/2023   Hyperlipidemia    Hypertension    Internal hemorrhoids    Leg pain 06/15/2021  murmur RSB, no diastolic murmur  Abdomen:  soft, non-tender, no masses   Extremities:  warm, well-perfused, pedal pulses not palpable, no lower extremity edema  Rectal/GU  Deferred  Neuro:   Grossly non-focal and symmetrical throughout  Skin:   Clean and dry, no rashes, no breakdown  Diagnostic Tests:  ECHOCARDIOGRAM REPORT       Patient Name:   Brian Hunt Date of Exam: 05/31/2023  Medical Rec #:  [id number]      Height:       67.0 in  Accession #:    [id number]     Weight:       162.0 lb  Date of Birth:  01/19/1946      BSA:          1.849 m  Patient Age:    76 years       BP:           132/86 mmHg  Patient Gender: M              HR:           61 bpm.  Exam Location:  Outpatient   Procedure: 2D Echo, 3D Echo, Color Doppler, Cardiac Doppler and Strain  Analysis   Indications:   Aortic Stenosis    History:        Patient has prior history of Echocardiogram examinations,  most                 recent 10/07/2022. PAD and COPD; Risk Factors:Hypertension,                  Dyslipidemia and Former Smoker. Chroic renal disease.    Sonographer:    Jeryl Columbia RDCS  Referring Phys: [id number] TIFFANY [place]    IMPRESSIONS     1. Left ventricular ejection fraction, by estimation, is 45 to 50%. Left  ventricular ejection fraction by 3D volume is 48 %. The left ventricle has  mildly decreased function. The left ventricle demonstrates global  hypokinesis. There is mild concentric  left ventricular hypertrophy. Left ventricular diastolic function could  not be evaluated.   2. Right ventricular systolic function is normal. The right ventricular  size is normal.   3. Left atrial size was moderately dilated.   4. Right atrial size was severely dilated.   5. The mitral valve is degenerative. Trivial mitral valve regurgitation.  No evidence of mitral stenosis. The mean mitral valve gradient is 4.0  mmHg. Moderate to severe mitral annular calcification.   6. The aortic valve is calcified. There is moderate calcification of the  aortic valve. There is severe thickening of the aortic valve. Aortic valve  regurgitation is mild. Severe aortic valve stenosis. Aortic valve area, by  VTI measures 0.79 cm. Aortic  valve mean gradient measures 41.0 mmHg. Aortic valve Vmax measures 3.90  m/s.   7. The inferior vena cava is normal in size with greater than 50%  respiratory variability, suggesting right atrial pressure of 3 mmHg.   FINDINGS   Left Ventricle: Left ventricular ejection fraction, by estimation, is 45  to 50%. Left ventricular ejection fraction by 3D volume is 48 %. The left  ventricle has mildly decreased function. The left ventricle demonstrates  global hypokinesis. Global  longitudinal strain performed but not reported based on interpreter  judgement due to suboptimal tracking. The left ventricular internal cavity  size was normal in size. There is mild concentric left ventricular  hypertrophy. Left ventricular diastolic  function  Patient ID: Brian Hunt, male   DOB: [birth year], 77 y.o.   MRN: [id number]  [facility]       [address]       [place] [phone]             [phone]          CARDIOTHORACIC SURGERY CONSULTATION REPORT  PCP is Etta Grandchild, MD Referring Provider is Alverda Skeans, MD Primary Cardiologist is Chilton Si, MD  Reason for consultation: Severe aortic stenosis  HPI:  The patient is a 77 year old gentleman with a history of hypertension, hyperlipidemia, COPD with active smoking, OSA on CPAP since April 2024, peripheral arterial disease, carotid artery disease, degenerative arthritis 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  His most recent echo on 05/31/2023 showed a mean gradient across aortic valve of 41 mmHg with a peak of 61 mmHg and a valve area by VTI of 0.79 cm. The aortic valve was severely calcified and thickened with restricted leaflet mobility.  Left ventricular ejection fraction was mildly reduced at 45 to 50%.  Previous echocardiogram in January 2024 showed a mean gradient of 35 mmHg.  Ejection fraction at that time was 70%.  He reports fatigue and shortness of breath with exertion but has continued to go to the gym and exercise.  He lifts weights and uses resistance bands and does cardio.  He has continued to play golf and rides an exercise bicycle.  He has been somewhat limited due to his lower extremity claudication.  He has had occasional episodes of lightheadedness when standing up which he relates to his blood pressure medications.  He denies any syncope.  He denies any chest pain or pressure.  He is here today with his wife.  He continues to smoke cigarettes.  Past Medical History:  Diagnosis Date   Anemia    Anxiety    Aortic stenosis, severe 03/09/2018              Reading Physician     Reading Date    Result Priority      Quintella Reichert, MD [phone]    07/06/2021    Routine     Narrative & Impression       The study is normal. The study is low risk.    RBBB with repolarization changes was present at baseline with no ST changes from baseline during infusion.  Occsaional PVCs were noted.    LV perfusion is normal. There is no evidence of is   Arthritis    Basal cell carcinoma    Basal Cell X2 , Dr Mayford Knife   BPH (benign prostatic hyperplasia)    Bursitis of left hip    Dr. 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   Heart murmur    mild AI/AS 06/30/21 echo   HFrEF (heart failure with reduced ejection fraction) (HCC) 06/08/2023   Hyperlipidemia    Hypertension    Internal hemorrhoids    Leg pain 06/15/2021  murmur RSB, no diastolic murmur  Abdomen:  soft, non-tender, no masses   Extremities:  warm, well-perfused, pedal pulses not palpable, no lower extremity edema  Rectal/GU  Deferred  Neuro:   Grossly non-focal and symmetrical throughout  Skin:   Clean and dry, no rashes, no breakdown  Diagnostic Tests:  ECHOCARDIOGRAM REPORT       Patient Name:   Brian Hunt Date of Exam: 05/31/2023  Medical Rec #:  [id number]      Height:       67.0 in  Accession #:    [id number]     Weight:       162.0 lb  Date of Birth:  01/19/1946      BSA:          1.849 m  Patient Age:    76 years       BP:           132/86 mmHg  Patient Gender: M              HR:           61 bpm.  Exam Location:  Outpatient   Procedure: 2D Echo, 3D Echo, Color Doppler, Cardiac Doppler and Strain  Analysis   Indications:   Aortic Stenosis    History:        Patient has prior history of Echocardiogram examinations,  most                 recent 10/07/2022. PAD and COPD; Risk Factors:Hypertension,                  Dyslipidemia and Former Smoker. Chroic renal disease.    Sonographer:    Jeryl Columbia RDCS  Referring Phys: [id number] TIFFANY [place]    IMPRESSIONS     1. Left ventricular ejection fraction, by estimation, is 45 to 50%. Left  ventricular ejection fraction by 3D volume is 48 %. The left ventricle has  mildly decreased function. The left ventricle demonstrates global  hypokinesis. There is mild concentric  left ventricular hypertrophy. Left ventricular diastolic function could  not be evaluated.   2. Right ventricular systolic function is normal. The right ventricular  size is normal.   3. Left atrial size was moderately dilated.   4. Right atrial size was severely dilated.   5. The mitral valve is degenerative. Trivial mitral valve regurgitation.  No evidence of mitral stenosis. The mean mitral valve gradient is 4.0  mmHg. Moderate to severe mitral annular calcification.   6. The aortic valve is calcified. There is moderate calcification of the  aortic valve. There is severe thickening of the aortic valve. Aortic valve  regurgitation is mild. Severe aortic valve stenosis. Aortic valve area, by  VTI measures 0.79 cm. Aortic  valve mean gradient measures 41.0 mmHg. Aortic valve Vmax measures 3.90  m/s.   7. The inferior vena cava is normal in size with greater than 50%  respiratory variability, suggesting right atrial pressure of 3 mmHg.   FINDINGS   Left Ventricle: Left ventricular ejection fraction, by estimation, is 45  to 50%. Left ventricular ejection fraction by 3D volume is 48 %. The left  ventricle has mildly decreased function. The left ventricle demonstrates  global hypokinesis. Global  longitudinal strain performed but not reported based on interpreter  judgement due to suboptimal tracking. The left ventricular internal cavity  size was normal in size. There is mild concentric left ventricular  hypertrophy. Left ventricular diastolic  function  murmur RSB, no diastolic murmur  Abdomen:  soft, non-tender, no masses   Extremities:  warm, well-perfused, pedal pulses not palpable, no lower extremity edema  Rectal/GU  Deferred  Neuro:   Grossly non-focal and symmetrical throughout  Skin:   Clean and dry, no rashes, no breakdown  Diagnostic Tests:  ECHOCARDIOGRAM REPORT       Patient Name:   Brian Hunt Date of Exam: 05/31/2023  Medical Rec #:  [id number]      Height:       67.0 in  Accession #:    [id number]     Weight:       162.0 lb  Date of Birth:  01/19/1946      BSA:          1.849 m  Patient Age:    76 years       BP:           132/86 mmHg  Patient Gender: M              HR:           61 bpm.  Exam Location:  Outpatient   Procedure: 2D Echo, 3D Echo, Color Doppler, Cardiac Doppler and Strain  Analysis   Indications:   Aortic Stenosis    History:        Patient has prior history of Echocardiogram examinations,  most                 recent 10/07/2022. PAD and COPD; Risk Factors:Hypertension,                  Dyslipidemia and Former Smoker. Chroic renal disease.    Sonographer:    Jeryl Columbia RDCS  Referring Phys: [id number] TIFFANY [place]    IMPRESSIONS     1. Left ventricular ejection fraction, by estimation, is 45 to 50%. Left  ventricular ejection fraction by 3D volume is 48 %. The left ventricle has  mildly decreased function. The left ventricle demonstrates global  hypokinesis. There is mild concentric  left ventricular hypertrophy. Left ventricular diastolic function could  not be evaluated.   2. Right ventricular systolic function is normal. The right ventricular  size is normal.   3. Left atrial size was moderately dilated.   4. Right atrial size was severely dilated.   5. The mitral valve is degenerative. Trivial mitral valve regurgitation.  No evidence of mitral stenosis. The mean mitral valve gradient is 4.0  mmHg. Moderate to severe mitral annular calcification.   6. The aortic valve is calcified. There is moderate calcification of the  aortic valve. There is severe thickening of the aortic valve. Aortic valve  regurgitation is mild. Severe aortic valve stenosis. Aortic valve area, by  VTI measures 0.79 cm. Aortic  valve mean gradient measures 41.0 mmHg. Aortic valve Vmax measures 3.90  m/s.   7. The inferior vena cava is normal in size with greater than 50%  respiratory variability, suggesting right atrial pressure of 3 mmHg.   FINDINGS   Left Ventricle: Left ventricular ejection fraction, by estimation, is 45  to 50%. Left ventricular ejection fraction by 3D volume is 48 %. The left  ventricle has mildly decreased function. The left ventricle demonstrates  global hypokinesis. Global  longitudinal strain performed but not reported based on interpreter  judgement due to suboptimal tracking. The left ventricular internal cavity  size was normal in size. There is mild concentric left ventricular  hypertrophy. Left ventricular diastolic  function  Patient ID: Brian Hunt, male   DOB: [birth year], 77 y.o.   MRN: [id number]  [facility]       [address]       [place] [phone]             [phone]          CARDIOTHORACIC SURGERY CONSULTATION REPORT  PCP is Etta Grandchild, MD Referring Provider is Alverda Skeans, MD Primary Cardiologist is Chilton Si, MD  Reason for consultation: Severe aortic stenosis  HPI:  The patient is a 77 year old gentleman with a history of hypertension, hyperlipidemia, COPD with active smoking, OSA on CPAP since April 2024, peripheral arterial disease, carotid artery disease, degenerative arthritis 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  His most recent echo on 05/31/2023 showed a mean gradient across aortic valve of 41 mmHg with a peak of 61 mmHg and a valve area by VTI of 0.79 cm. The aortic valve was severely calcified and thickened with restricted leaflet mobility.  Left ventricular ejection fraction was mildly reduced at 45 to 50%.  Previous echocardiogram in January 2024 showed a mean gradient of 35 mmHg.  Ejection fraction at that time was 70%.  He reports fatigue and shortness of breath with exertion but has continued to go to the gym and exercise.  He lifts weights and uses resistance bands and does cardio.  He has continued to play golf and rides an exercise bicycle.  He has been somewhat limited due to his lower extremity claudication.  He has had occasional episodes of lightheadedness when standing up which he relates to his blood pressure medications.  He denies any syncope.  He denies any chest pain or pressure.  He is here today with his wife.  He continues to smoke cigarettes.  Past Medical History:  Diagnosis Date   Anemia    Anxiety    Aortic stenosis, severe 03/09/2018              Reading Physician     Reading Date    Result Priority      Quintella Reichert, MD [phone]    07/06/2021    Routine     Narrative & Impression       The study is normal. The study is low risk.    RBBB with repolarization changes was present at baseline with no ST changes from baseline during infusion.  Occsaional PVCs were noted.    LV perfusion is normal. There is no evidence of is   Arthritis    Basal cell carcinoma    Basal Cell X2 , Dr Mayford Knife   BPH (benign prostatic hyperplasia)    Bursitis of left hip    Dr. 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   Heart murmur    mild AI/AS 06/30/21 echo   HFrEF (heart failure with reduced ejection fraction) (HCC) 06/08/2023   Hyperlipidemia    Hypertension    Internal hemorrhoids    Leg pain 06/15/2021  murmur RSB, no diastolic murmur  Abdomen:  soft, non-tender, no masses   Extremities:  warm, well-perfused, pedal pulses not palpable, no lower extremity edema  Rectal/GU  Deferred  Neuro:   Grossly non-focal and symmetrical throughout  Skin:   Clean and dry, no rashes, no breakdown  Diagnostic Tests:  ECHOCARDIOGRAM REPORT       Patient Name:   Brian Hunt Date of Exam: 05/31/2023  Medical Rec #:  [id number]      Height:       67.0 in  Accession #:    [id number]     Weight:       162.0 lb  Date of Birth:  01/19/1946      BSA:          1.849 m  Patient Age:    76 years       BP:           132/86 mmHg  Patient Gender: M              HR:           61 bpm.  Exam Location:  Outpatient   Procedure: 2D Echo, 3D Echo, Color Doppler, Cardiac Doppler and Strain  Analysis   Indications:   Aortic Stenosis    History:        Patient has prior history of Echocardiogram examinations,  most                 recent 10/07/2022. PAD and COPD; Risk Factors:Hypertension,                  Dyslipidemia and Former Smoker. Chroic renal disease.    Sonographer:    Jeryl Columbia RDCS  Referring Phys: [id number] TIFFANY [place]    IMPRESSIONS     1. Left ventricular ejection fraction, by estimation, is 45 to 50%. Left  ventricular ejection fraction by 3D volume is 48 %. The left ventricle has  mildly decreased function. The left ventricle demonstrates global  hypokinesis. There is mild concentric  left ventricular hypertrophy. Left ventricular diastolic function could  not be evaluated.   2. Right ventricular systolic function is normal. The right ventricular  size is normal.   3. Left atrial size was moderately dilated.   4. Right atrial size was severely dilated.   5. The mitral valve is degenerative. Trivial mitral valve regurgitation.  No evidence of mitral stenosis. The mean mitral valve gradient is 4.0  mmHg. Moderate to severe mitral annular calcification.   6. The aortic valve is calcified. There is moderate calcification of the  aortic valve. There is severe thickening of the aortic valve. Aortic valve  regurgitation is mild. Severe aortic valve stenosis. Aortic valve area, by  VTI measures 0.79 cm. Aortic  valve mean gradient measures 41.0 mmHg. Aortic valve Vmax measures 3.90  m/s.   7. The inferior vena cava is normal in size with greater than 50%  respiratory variability, suggesting right atrial pressure of 3 mmHg.   FINDINGS   Left Ventricle: Left ventricular ejection fraction, by estimation, is 45  to 50%. Left ventricular ejection fraction by 3D volume is 48 %. The left  ventricle has mildly decreased function. The left ventricle demonstrates  global hypokinesis. Global  longitudinal strain performed but not reported based on interpreter  judgement due to suboptimal tracking. The left ventricular internal cavity  size was normal in size. There is mild concentric left ventricular  hypertrophy. Left ventricular diastolic  function  murmur RSB, no diastolic murmur  Abdomen:  soft, non-tender, no masses   Extremities:  warm, well-perfused, pedal pulses not palpable, no lower extremity edema  Rectal/GU  Deferred  Neuro:   Grossly non-focal and symmetrical throughout  Skin:   Clean and dry, no rashes, no breakdown  Diagnostic Tests:  ECHOCARDIOGRAM REPORT       Patient Name:   Brian Hunt Date of Exam: 05/31/2023  Medical Rec #:  [id number]      Height:       67.0 in  Accession #:    [id number]     Weight:       162.0 lb  Date of Birth:  01/19/1946      BSA:          1.849 m  Patient Age:    76 years       BP:           132/86 mmHg  Patient Gender: M              HR:           61 bpm.  Exam Location:  Outpatient   Procedure: 2D Echo, 3D Echo, Color Doppler, Cardiac Doppler and Strain  Analysis   Indications:   Aortic Stenosis    History:        Patient has prior history of Echocardiogram examinations,  most                 recent 10/07/2022. PAD and COPD; Risk Factors:Hypertension,                  Dyslipidemia and Former Smoker. Chroic renal disease.    Sonographer:    Jeryl Columbia RDCS  Referring Phys: [id number] TIFFANY [place]    IMPRESSIONS     1. Left ventricular ejection fraction, by estimation, is 45 to 50%. Left  ventricular ejection fraction by 3D volume is 48 %. The left ventricle has  mildly decreased function. The left ventricle demonstrates global  hypokinesis. There is mild concentric  left ventricular hypertrophy. Left ventricular diastolic function could  not be evaluated.   2. Right ventricular systolic function is normal. The right ventricular  size is normal.   3. Left atrial size was moderately dilated.   4. Right atrial size was severely dilated.   5. The mitral valve is degenerative. Trivial mitral valve regurgitation.  No evidence of mitral stenosis. The mean mitral valve gradient is 4.0  mmHg. Moderate to severe mitral annular calcification.   6. The aortic valve is calcified. There is moderate calcification of the  aortic valve. There is severe thickening of the aortic valve. Aortic valve  regurgitation is mild. Severe aortic valve stenosis. Aortic valve area, by  VTI measures 0.79 cm. Aortic  valve mean gradient measures 41.0 mmHg. Aortic valve Vmax measures 3.90  m/s.   7. The inferior vena cava is normal in size with greater than 50%  respiratory variability, suggesting right atrial pressure of 3 mmHg.   FINDINGS   Left Ventricle: Left ventricular ejection fraction, by estimation, is 45  to 50%. Left ventricular ejection fraction by 3D volume is 48 %. The left  ventricle has mildly decreased function. The left ventricle demonstrates  global hypokinesis. Global  longitudinal strain performed but not reported based on interpreter  judgement due to suboptimal tracking. The left ventricular internal cavity  size was normal in size. There is mild concentric left ventricular  hypertrophy. Left ventricular diastolic  function  Patient ID: Brian Hunt, male   DOB: [birth year], 77 y.o.   MRN: [id number]  [facility]       [address]       [place] [id number]             [phone]          CARDIOTHORACIC SURGERY CONSULTATION REPORT  PCP is Etta Grandchild, MD Referring Provider is Alverda Skeans, MD Primary Cardiologist is Chilton Si, MD  Reason for consultation: Severe aortic stenosis  HPI:  The patient is a 77 year old gentleman with a history of hypertension, hyperlipidemia, COPD with active smoking, OSA on CPAP since April 2024, peripheral arterial disease, carotid artery disease, degenerative arthritis 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  His most recent echo on 05/31/2023 showed a mean gradient across aortic valve of 41 mmHg with a peak of 61 mmHg and a valve area by VTI of 0.79 cm. The aortic valve was severely calcified and thickened with restricted leaflet mobility.  Left ventricular ejection fraction was mildly reduced at 45 to 50%.  Previous echocardiogram in January 2024 showed a mean gradient of 35 mmHg.  Ejection fraction at that time was 70%.  He reports fatigue and shortness of breath with exertion but has continued to go to the gym and exercise.  He lifts weights and uses resistance bands and does cardio.  He has continued to play golf and rides an exercise bicycle.  He has been somewhat limited due to his lower extremity claudication.  He has had occasional episodes of lightheadedness when standing up which he relates to his blood pressure medications.  He denies any syncope.  He denies any chest pain or pressure.  He is here today with his wife.  He continues to smoke cigarettes.  Past Medical History:  Diagnosis Date   Anemia    Anxiety    Aortic stenosis, severe 03/09/2018              Reading Physician     Reading Date    Result Priority      Quintella Reichert, MD [phone]    07/06/2021    Routine     Narrative & Impression       The study is normal. The study is low risk.    RBBB with repolarization changes was present at baseline with no ST changes from baseline during infusion.  Occsaional PVCs were noted.    LV perfusion is normal. There is no evidence of is   Arthritis    Basal cell carcinoma    Basal Cell X2 , Dr Mayford Knife   BPH (benign prostatic hyperplasia)    Bursitis of left hip    Dr. 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   MRN: [id number]  [facility]       [address]       [place] [address]             [phone]          CARDIOTHORACIC SURGERY CONSULTATION REPORT  PCP is Etta Grandchild, MD Referring Provider is Alverda Skeans, MD Primary Cardiologist is Chilton Si, MD  Reason for consultation: Severe aortic stenosis  HPI:  The patient is a 77 year old gentleman with a history of hypertension, hyperlipidemia, COPD with active smoking, OSA on CPAP since April 2024, peripheral arterial disease, carotid artery disease, degenerative arthritis 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  His most recent echo on 05/31/2023 showed a mean gradient across aortic valve of 41 mmHg with a peak of 61 mmHg and a valve area by VTI of 0.79 cm. The aortic valve was severely calcified and thickened with restricted leaflet mobility.  Left ventricular ejection fraction was mildly reduced at 45 to 50%.  Previous echocardiogram in January 2024 showed a mean gradient of 35 mmHg.  Ejection fraction at that time was 70%.  He reports fatigue and shortness of breath with exertion but has continued to go to the gym and exercise.  He lifts weights and uses resistance bands and does cardio.  He has continued to play golf and rides an exercise bicycle.  He has been somewhat limited due to his lower extremity claudication.  He has had occasional episodes of lightheadedness when standing up which he relates to his blood pressure medications.  He denies any syncope.  He denies any chest pain or pressure.  He is here today with his wife.  He continues to smoke cigarettes.  Past Medical History:  Diagnosis Date   Anemia    Anxiety    Aortic stenosis, severe 03/09/2018              Reading Physician     Reading Date    Result Priority      Quintella Reichert, MD [phone]    07/06/2021    Routine     Narrative & Impression       The study is normal. The study is low risk.    RBBB with repolarization changes was present at baseline with no ST changes from baseline during infusion.  Occsaional PVCs were noted.    LV perfusion is normal. There is no evidence of is   Arthritis    Basal cell carcinoma    Basal Cell X2 , Dr Mayford Knife   BPH (benign prostatic hyperplasia)    Bursitis of left hip    Dr. 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

## 2023-07-05 ENCOUNTER — Ambulatory Visit: Payer: Medicare PPO

## 2023-07-05 ENCOUNTER — Other Ambulatory Visit: Payer: Self-pay

## 2023-07-05 VITALS — Ht 67.0 in | Wt 164.0 lb

## 2023-07-05 DIAGNOSIS — Z0000 Encounter for general adult medical examination without abnormal findings: Secondary | ICD-10-CM | POA: Diagnosis not present

## 2023-07-05 DIAGNOSIS — I350 Nonrheumatic aortic (valve) stenosis: Secondary | ICD-10-CM

## 2023-07-05 NOTE — Progress Notes (Signed)
Subjective:   Brian Hunt is a 77 y.o. male who presents for Medicare Annual/Subsequent preventive examination.  Visit Complete: Virtual I connected with  Brian Hunt on 07/05/23 by a audio enabled telemedicine application and verified that I am speaking with the correct person using two identifiers.  Patient Location: Home  Provider Location: Office/Clinic  I discussed the limitations of evaluation and management by telemedicine. The patient expressed understanding and agreed to proceed.  Vital Signs: Because this visit was a virtual/telehealth visit, some criteria may be missing or patient reported. Any vitals not documented were not able to be obtained and vitals that have been documented are patient reported.    Cardiac Risk Factors include: advanced age (>25men, >18 women);male gender;Other (see comment);dyslipidemia, Risk factor comments: Aortic stenosis, Carotid stenosis, PAD, COPD, BPH, Chronic renal disease     Objective:    Today's Vitals   07/05/23 1111  Weight: 164 lb (74.4 kg)  Height: 5\' 7"  (1.702 m)   Body mass index is 25.69 kg/m.     07/05/2023   11:27 AM 06/18/2023    6:33 AM 08/01/2022   11:22 AM 07/12/2022    3:49 PM 10/15/2020   10:08 AM 06/20/2018   10:15 AM 12/13/2017   10:52 AM  Advanced Directives  Does Patient Have a Medical Advance Directive? Yes No Yes Yes Yes Yes Yes  Type of Estate agent of [place];Living will  Healthcare Power of [place];Living will Healthcare Power of [place];Living will Healthcare Power of [place];Living will Healthcare Power of [place];Living will Healthcare Power of [place];Living will  Does patient want to make changes to medical advance directive?   No - Patient declined  No - Patient declined    Copy of Healthcare Power of Attorney in Chart? No - copy requested   No - copy requested No - copy requested    Would patient like information on creating a medical advance directive?  No -  Patient declined         Current Medications (verified) Outpatient Encounter Medications as of 07/05/2023  Medication Sig   aspirin EC 81 MG tablet Take 81 mg by mouth daily.   cilostazol (PLETAL) 50 MG tablet TAKE 1 TABLET(50 MG) BY MOUTH TWICE DAILY   famotidine (PEPCID) 20 MG tablet Take 20 mg by mouth daily as needed for heartburn or indigestion.   hydrocortisone cream 1 % Apply 1 Application topically daily as needed for itching.   metoprolol tartrate (LOPRESSOR) 50 MG tablet Take 1 tablet (50 mg total) by mouth as directed for 1 dose. Take 1 tablet two hours before your CT scan to slow your heart rate   Multiple Vitamins-Minerals (MULTIVITAMIN WITH MINERALS) tablet Take 1 tablet by mouth daily.   Propylene Glycol (SYSTANE BALANCE) 0.6 % SOLN Place 1 drop into both eyes as needed (dry eyes).   rosuvastatin (CRESTOR) 5 MG tablet TAKE 1 TABLET EVERY DAY   tamsulosin (FLOMAX) 0.4 MG CAPS capsule Take 0.4 mg by mouth at bedtime.   telmisartan (MICARDIS) 40 MG tablet Take 1 tablet (40 mg total) by mouth daily.   Tiotropium Bromide-Olodaterol (STIOLTO RESPIMAT) 2.5-2.5 MCG/ACT AERS Inhale 1 puff into the lungs daily.   No facility-administered encounter medications on file as of 07/05/2023.    Allergies (verified) Demeclocycline, Pravastatin, Lisinopril, and Oxycodone   History: Past Medical History:  Diagnosis Date   Anemia    Anxiety    Aortic stenosis, severe 03/09/2018  Reading Physician    Reading Date    Result Priority      Quintella Reichert, MD [phone]    07/06/2021    Routine     Narrative & Impression       The study is normal. The study is low risk.    RBBB with repolarization changes was present at baseline with no ST changes from baseline during infusion.  Occsaional PVCs were noted.    LV perfusion is normal. There is no evidence of is   Arthritis    Basal cell carcinoma    Basal Cell X2 , Dr Mayford Knife   BPH (benign prostatic hyperplasia)    Bursitis of  left hip    Dr. 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   Heart murmur    mild AI/AS 06/30/21 echo   HFrEF (heart failure with reduced ejection fraction) (HCC) 06/08/2023   Hyperlipidemia    Hypertension    Internal hemorrhoids    Leg pain 06/15/2021   OSA on CPAP    PAD (peripheral artery disease) (HCC) 10/17/2021   Prostatitis    Dr Vonita Moss   RBBB    Shortness of breath 09/29/2022   Past Surgical History:  Procedure Laterality Date   COLONOSCOPY  2010   negative; [facility]   CYSTOSCOPY  10/2014   [facility]; neg   dislocation of right hip  2019   GREEN LIGHT LASER TURP (TRANSURETHRAL RESECTION OF PROSTATE  09/27/2012   Procedure: GREEN LIGHT LASER TURP (TRANSURETHRAL RESECTION OF PROSTATE;  Surgeon: [name], MD;  Location: [facility];  Service: Urology;  Laterality: N/A;      JOINT REPLACEMENT Left Oct. 12, 2012   left total hip replacement by Dr. Turner Daniels   PROSTATE BIOPSY  2007 , 2009   X2 , Dr Cleatrice Burke  & Dr Vonita Moss   PROSTATE SURGERY     Laser   RIGHT/LEFT HEART CATH AND CORONARY ANGIOGRAPHY N/A 06/18/2023   Procedure: RIGHT/LEFT HEART CATH AND CORONARY ANGIOGRAPHY;  Surgeon: [name], MD;  Location: [facility];  Service: Cardiovascular;  Laterality: N/A;   TOTAL HIP ARTHROPLASTY Right 09/26/2017   TOTAL HIP ARTHROPLASTY Right 09/26/2017   Procedure: TOTAL HIP ARTHROPLASTY ANTERIOR APPROACH;  Surgeon: Gean Birchwood, MD;  Location: [facility];  Service: Orthopedics;  Laterality: Right;   TOTAL SHOULDER ARTHROPLASTY Right 08/17/2022   Procedure: TOTAL SHOULDER ARTHROPLASTY WITH AUGMENTED GLENOID;  Surgeon: Jones Broom, MD;   Location: [facility];  Service: Orthopedics;  Laterality: Right;   Family History  Problem Relation Age of Onset   Arthritis Mother    Vascular Disease Mother        Varicose Veins   Hypertension Mother    Hyperlipidemia Mother    Transient ischemic attack Mother    Heart disease Mother        After age 11   Stroke Mother    COPD Father    Bone cancer Maternal Grandmother    Heart attack Paternal Grandmother 53   Colon cancer Son 10   Turner syndrome Daughter    Asthma Neg Hx  Diabetes Neg Hx    Pancreatic cancer Neg Hx    Esophageal cancer Neg Hx    Stomach cancer Neg Hx    Liver disease Neg Hx    Rectal cancer Neg Hx    Social History   Socioeconomic History   Marital status: Married    Spouse name: Andrey Campanile   Number of children: 2   Years of education: Not on file   Highest education level: Master's degree (e.g., MA, MS, MEng, MEd, MSW, MBA)  Occupational History   Occupation: retired  Tobacco Use   Smoking status: Former    Current packs/day: 0.00    Types: Cigarettes    Quit date: 06/14/1981    Years since quitting: 42.0   Smokeless tobacco: Never  Vaping Use   Vaping status: Never Used  Substance and Sexual Activity   Alcohol use: No    Comment: 2001- states no rehab   Drug use: No   Sexual activity: Yes  Other Topics Concern   Not on file  Social History Narrative      Lives with wife.    Social Determinants of Health   Financial Resource Strain: Low Risk  (07/05/2023)   Overall Financial Resource Strain (CARDIA)    Difficulty of Paying Living Expenses: Not hard at all  Food Insecurity: No Food Insecurity (07/05/2023)   Hunger Vital Sign    Worried About Running Out of Food in the Last Year: Never true    Ran Out of Food in the Last Year: Never true  Transportation Needs: No Transportation Needs (07/05/2023)   PRAPARE - Administrator, Civil Service (Medical): No    Lack of Transportation (Non-Medical): No  Physical Activity:  Insufficiently Active (07/05/2023)   Exercise Vital Sign    Days of Exercise per Week: 1 day    Minutes of Exercise per Session: 60 min  Stress: No Stress Concern Present (07/05/2023)   [employer] of Occupational Health - Occupational Stress Questionnaire    Feeling of Stress : Not at all  Social Connections: Moderately Integrated (07/05/2023)   Social Connection and Isolation Panel [NHANES]    Frequency of Communication with Friends and Family: Three times a week    Frequency of Social Gatherings with Friends and Family: Never    Attends Religious Services: Never    Database administrator or Organizations: Yes    Attends Banker Meetings: Never    Marital Status: Married    Tobacco Counseling Counseling given: Not Answered   Clinical Intake:  Pre-visit preparation completed: Yes  Pain : No/denies pain     BMI - recorded: 25.69 Nutritional Status: BMI 25 -29 Overweight Nutritional Risks: None Diabetes: No  How often do you need to have someone help you when you read instructions, pamphlets, or other written materials from your doctor or pharmacy?: 1 - Never  Interpreter Needed?: No      Activities of Daily Living    07/05/2023   11:19 AM 08/01/2022   11:30 AM  In your present state of health, do you have any difficulty performing the following activities:  Hearing? 1   Vision? 0   Difficulty concentrating or making decisions? 0   Walking or climbing stairs? 0   Dressing or bathing? 0   Doing errands, shopping? 0 0  Preparing Food and eating ? N   Using the Toilet? N   In the past six months, have you accidently leaked urine? N   Do you  have problems with loss of bowel control? N   Managing your Medications? N   Managing your Finances? N   Housekeeping or managing your Housekeeping? N     Patient Care Team: Etta Grandchild, MD as PCP - General (Internal Medicine) Quintella Reichert, MD as PCP - Sleep Medicine (Cardiology) Gean Birchwood,  MD (Orthopedic Surgery) Jerilee Field, MD as Consulting Physician (Urology) Dermatology, [facility] as Consulting Physician Diona Foley, MD as Consulting Physician (Ophthalmology) Mia Creek, MD as Consulting Physician (Ophthalmology)  Indicate any recent Medical Services you may have received from other than [facility] providers in the past year (date may be approximate).     Assessment:   This is a routine wellness examination for [place].  Hearing/Vision screen Hearing Screening - Comments:: Slight hearing loss Vision Screening - Comments:: Wears eye glasses   Goals Addressed             This Visit's Progress    Patient Stated   On track    My goal is to have shoulder replacement surgery, continue to exercise, and pay more attention to my diet.  Pt has had his shoulder surgery going on 1 year.  (2024)      Depression Screen    07/05/2023   11:33 AM 04/17/2023   10:24 AM 10/24/2022    9:17 AM 07/12/2022    3:53 PM 11/17/2021    1:06 PM 10/15/2020   10:06 AM 08/21/2019    8:58 AM  PHQ 2/9 Scores  PHQ - 2 Score 0 1 0 0 2 0 0  PHQ- 9 Score 0 3         Fall Risk    07/05/2023   11:27 AM 10/24/2022    9:17 AM 07/12/2022    3:50 PM 11/17/2021    1:06 PM 10/15/2020   10:08 AM  Fall Risk   Falls in the past year? 1 0 0 0 0  Number falls in past yr: 0 0 0  0  Injury with Fall? 0 0 0  0  Risk for fall due to :  No Fall Risks No Fall Risks  No Fall Risks  Follow up Falls evaluation completed;Falls prevention discussed Falls evaluation completed Falls prevention discussed  Falls evaluation completed    MEDICARE RISK AT HOME: Medicare Risk at Home Any stairs in or around the home?: Yes If so, are there any without handrails?: Yes Home free of loose throw rugs in walkways, pet beds, electrical cords, etc?: Yes Adequate lighting in your home to reduce risk of falls?: Yes Life alert?: No Use of a cane, walker or w/c?: No Grab bars in the bathroom?: No Shower chair or  bench in shower?: No Elevated toilet seat or a handicapped toilet?: No  TIMED UP AND GO:  Was the test performed?  No    Cognitive Function:        07/05/2023   11:28 AM 07/12/2022    4:06 PM  6CIT Screen  What Year? 0 points 0 points  What month? 0 points 0 points  What time? 0 points 0 points  Count back from 20 0 points 0 points  Months in reverse 0 points 0 points  Repeat phrase 0 points 0 points  Total Score 0 points 0 points    Immunizations Immunization History  Administered Date(s) Administered   Influenza Whole 09/19/1999, 07/24/2013   Influenza, High Dose Seasonal PF 06/14/2016, 07/16/2017, 08/19/2018   Influenza-Unspecified 06/13/2019, 06/21/2020, 07/18/2021   PFIZER(Purple Top)SARS-COV-2 Vaccination  10/08/2019, 10/29/2019, 06/21/2020   Pneumococcal Conjugate-13 02/26/2014   Pneumococcal Polysaccharide-23 06/19/2011, 09/18/2014, 11/17/2021   RSV,unspecified 10/27/2022   Tdap 05/11/2015   Zoster Recombinant(Shingrix) 11/30/2021, 03/30/2022    TDAP status: Up to date  Flu Vaccine status: Due, Education has been provided regarding the importance of this vaccine. Advised may receive this vaccine at local pharmacy or Health Dept. Aware to provide a copy of the vaccination record if obtained from local pharmacy or Health Dept. Verbalized acceptance and understanding.  Pneumococcal vaccine status: Up to date  Covid-19 vaccine status: Information provided on how to obtain vaccines.   Qualifies for Shingles Vaccine? Yes   Zostavax completed Yes   Shingrix Completed?: Yes  Screening Tests Health Maintenance  Topic Date Due   INFLUENZA VACCINE  12/17/2023 (Originally 04/19/2023)   Medicare Annual Wellness (AWV)  07/04/2024   DTaP/Tdap/Td (2 - Td or Tdap) 05/10/2025   Colonoscopy  05/12/2025   Pneumonia Vaccine 74+ Years old  Completed   Hepatitis C Screening  Completed   Zoster Vaccines- Shingrix  Completed   HPV VACCINES  Aged Out   COVID-19 Vaccine   Discontinued    Health Maintenance  There are no preventive care reminders to display for this patient.   Colorectal cancer screening: Type of screening: Colonoscopy. Completed 05/12/2020. Repeat every 5 years  Lung Cancer Screening: (Low Dose CT Chest recommended if Age 27-80 years, 20 pack-year currently smoking OR have quit w/in 15years.) does not qualify.   Lung Cancer Screening Referral: N/A  Additional Screening:  Hepatitis C Screening: does qualify; Completed 06/14/2016  Vision Screening: Recommended annual ophthalmology exams for early detection of glaucoma and other disorders of the eye. Is the patient up to date with their annual eye exam?  Yes  Who is the provider or what is the name of the office in which the patient attends annual eye exams? Transitioning to see Dr. Vonna Kotyk If pt is not established with a provider, would they like to be referred to a provider to establish care? No .   Dental Screening: Recommended annual dental exams for proper oral hygiene   Community Resource Referral / Chronic Care Management: CRR required this visit?  No   CCM required this visit?  No     Plan:     I have personally reviewed and noted the following in the patient's chart:   Medical and social history Use of alcohol, tobacco or illicit drugs  Current medications and supplements including opioid prescriptions. Patient is not currently taking opioid prescriptions. Functional ability and status Nutritional status Physical activity Advanced directives List of other physicians Hospitalizations, surgeries, and ER visits in previous 12 months Vitals Screenings to include cognitive, depression, and falls Referrals and appointments  In addition, I have reviewed and discussed with patient certain preventive protocols, quality metrics, and best practice recommendations. A written personalized care plan for preventive services as well as general preventive health recommendations  were provided to patient.     Ramona Ruark L Brysin Towery, CMA   07/05/2023   After Visit Summary: (MyChart) Due to this being a telephonic visit, the after visit summary with patients personalized plan was offered to patient via MyChart   Nurse Notes: Patient is due for a Flu vaccine and also a Covid vaccine.  Patient stats that he was recently diagnosed with OSA and is currently using a CPAP.  He is scheduled to have heart surgery next week on the 07/10/23.  Also patient stated that he is in transition of changing  eye care provider to Dr. Vonna Kotyk.  Patient had no other concerns to address today.  He will wait to schedule his next year's AWVs.

## 2023-07-05 NOTE — Patient Instructions (Signed)
Brian Hunt , Thank you for taking time to come for your Medicare Wellness Visit. I appreciate your ongoing commitment to your health goals. Please review the following plan we discussed and let me know if I can assist you in the future.   Referrals/Orders/Follow-Ups/Clinician Recommendations: You are due for a Flu and Covid vaccines.   Good luck with surgery.  Each day, aim for 6 glasses of water, plenty of protein in your diet and try to get up and walk/ stretch every hour for 5-10 minutes at a time.    This is a list of the screening recommended for you and due dates:  Health Maintenance  Topic Date Due   Flu Shot  12/17/2023*   Medicare Annual Wellness Visit  07/04/2024   DTaP/Tdap/Td vaccine (2 - Td or Tdap) 05/10/2025   Colon Cancer Screening  05/12/2025   Pneumonia Vaccine  Completed   Hepatitis C Screening  Completed   Zoster (Shingles) Vaccine  Completed   HPV Vaccine  Aged Out   COVID-19 Vaccine  Discontinued  *Topic was postponed. The date shown is not the original due date.    Advanced directives: (Copy Requested) Please bring a copy of your health care power of attorney and living will to the office to be added to your chart at your convenience.  Next Medicare Annual Wellness Visit scheduled for next year: No

## 2023-07-09 ENCOUNTER — Ambulatory Visit (HOSPITAL_COMMUNITY)
Admission: RE | Admit: 2023-07-09 | Discharge: 2023-07-09 | Disposition: A | Discharge status: Home / Self Care | Payer: Medicare PPO | Source: Ambulatory Visit | Attending: Cardiovascular Disease | Admitting: Cardiovascular Disease

## 2023-07-09 ENCOUNTER — Other Ambulatory Visit: Payer: Self-pay

## 2023-07-09 ENCOUNTER — Encounter (HOSPITAL_COMMUNITY)
Admission: RE | Admit: 2023-07-09 | Discharge: 2023-07-09 | Disposition: A | Discharge status: Home / Self Care | Payer: Medicare PPO | Source: Ambulatory Visit | Attending: Cardiovascular Disease | Admitting: Cardiovascular Disease

## 2023-07-09 DIAGNOSIS — D6489 Other specified anemias: Secondary | ICD-10-CM | POA: Diagnosis not present

## 2023-07-09 DIAGNOSIS — Z950 Presence of cardiac pacemaker: Secondary | ICD-10-CM | POA: Diagnosis not present

## 2023-07-09 DIAGNOSIS — I358 Other nonrheumatic aortic valve disorders: Secondary | ICD-10-CM | POA: Diagnosis present

## 2023-07-09 DIAGNOSIS — I493 Ventricular premature depolarization: Secondary | ICD-10-CM | POA: Diagnosis present

## 2023-07-09 DIAGNOSIS — I110 Hypertensive heart disease with heart failure: Secondary | ICD-10-CM | POA: Diagnosis present

## 2023-07-09 DIAGNOSIS — I700 Atherosclerosis of aorta: Secondary | ICD-10-CM | POA: Diagnosis not present

## 2023-07-09 DIAGNOSIS — Z1152 Encounter for screening for COVID-19: Secondary | ICD-10-CM | POA: Diagnosis not present

## 2023-07-09 DIAGNOSIS — N401 Enlarged prostate with lower urinary tract symptoms: Secondary | ICD-10-CM | POA: Diagnosis present

## 2023-07-09 DIAGNOSIS — R338 Other retention of urine: Secondary | ICD-10-CM | POA: Diagnosis present

## 2023-07-09 DIAGNOSIS — E871 Hypo-osmolality and hyponatremia: Secondary | ICD-10-CM | POA: Diagnosis present

## 2023-07-09 DIAGNOSIS — I5042 Chronic combined systolic (congestive) and diastolic (congestive) heart failure: Secondary | ICD-10-CM | POA: Diagnosis present

## 2023-07-09 DIAGNOSIS — F17210 Nicotine dependence, cigarettes, uncomplicated: Secondary | ICD-10-CM | POA: Diagnosis present

## 2023-07-09 DIAGNOSIS — G4733 Obstructive sleep apnea (adult) (pediatric): Secondary | ICD-10-CM | POA: Diagnosis present

## 2023-07-09 DIAGNOSIS — F419 Anxiety disorder, unspecified: Secondary | ICD-10-CM | POA: Diagnosis present

## 2023-07-09 DIAGNOSIS — Z006 Encounter for examination for normal comparison and control in clinical research program: Secondary | ICD-10-CM | POA: Diagnosis not present

## 2023-07-09 DIAGNOSIS — Z952 Presence of prosthetic heart valve: Secondary | ICD-10-CM | POA: Diagnosis not present

## 2023-07-09 DIAGNOSIS — R011 Cardiac murmur, unspecified: Secondary | ICD-10-CM | POA: Diagnosis present

## 2023-07-09 DIAGNOSIS — Z01818 Encounter for other preprocedural examination: Secondary | ICD-10-CM | POA: Insufficient documentation

## 2023-07-09 DIAGNOSIS — I350 Nonrheumatic aortic (valve) stenosis: Secondary | ICD-10-CM | POA: Insufficient documentation

## 2023-07-09 DIAGNOSIS — I739 Peripheral vascular disease, unspecified: Secondary | ICD-10-CM | POA: Diagnosis present

## 2023-07-09 DIAGNOSIS — Z96611 Presence of right artificial shoulder joint: Secondary | ICD-10-CM | POA: Diagnosis present

## 2023-07-09 DIAGNOSIS — M1990 Unspecified osteoarthritis, unspecified site: Secondary | ICD-10-CM | POA: Diagnosis present

## 2023-07-09 DIAGNOSIS — E785 Hyperlipidemia, unspecified: Secondary | ICD-10-CM | POA: Diagnosis present

## 2023-07-09 DIAGNOSIS — I2510 Atherosclerotic heart disease of native coronary artery without angina pectoris: Secondary | ICD-10-CM | POA: Diagnosis present

## 2023-07-09 DIAGNOSIS — M549 Dorsalgia, unspecified: Secondary | ICD-10-CM | POA: Diagnosis not present

## 2023-07-09 DIAGNOSIS — J449 Chronic obstructive pulmonary disease, unspecified: Secondary | ICD-10-CM | POA: Diagnosis present

## 2023-07-09 DIAGNOSIS — E041 Nontoxic single thyroid nodule: Secondary | ICD-10-CM | POA: Diagnosis present

## 2023-07-09 DIAGNOSIS — I442 Atrioventricular block, complete: Secondary | ICD-10-CM | POA: Diagnosis present

## 2023-07-09 LAB — CBC
HCT: 38.8 % — ABNORMAL LOW (ref 39.0–52.0)
Hemoglobin: 13 g/dL (ref 13.0–17.0)
MCH: 32.7 pg (ref 26.0–34.0)
MCHC: 33.5 g/dL (ref 30.0–36.0)
MCV: 97.7 fL (ref 80.0–100.0)
Platelets: 275 10*3/uL (ref 150–400)
RBC: 3.97 MIL/uL — ABNORMAL LOW (ref 4.22–5.81)
RDW: 13.3 % (ref 11.5–15.5)
WBC: 8.1 10*3/uL (ref 4.0–10.5)
nRBC: 0 % (ref 0.0–0.2)

## 2023-07-09 LAB — TYPE AND SCREEN
ABO/RH(D): O POS
Antibody Screen: NEGATIVE

## 2023-07-09 LAB — PROTIME-INR
INR: 1.1 (ref 0.8–1.2)
Prothrombin Time: 14.1 s (ref 11.4–15.2)

## 2023-07-09 LAB — COMPREHENSIVE METABOLIC PANEL
ALT: 18 U/L (ref 0–44)
AST: 24 U/L (ref 15–41)
Albumin: 3.3 g/dL — ABNORMAL LOW (ref 3.5–5.0)
Alkaline Phosphatase: 62 U/L (ref 38–126)
Anion gap: 11 (ref 5–15)
BUN: 11 mg/dL (ref 8–23)
CO2: 25 mmol/L (ref 22–32)
Calcium: 9.2 mg/dL (ref 8.9–10.3)
Chloride: 96 mmol/L — ABNORMAL LOW (ref 98–111)
Creatinine, Ser: 0.81 mg/dL (ref 0.61–1.24)
GFR, Estimated: >60 mL/min (ref >60)
Glucose, Bld: 103 mg/dL — ABNORMAL HIGH (ref 70–99)
Potassium: 4 mmol/L (ref 3.5–5.1)
Sodium: 132 mmol/L — ABNORMAL LOW (ref 135–145)
Total Bilirubin: 0.5 mg/dL (ref 0.3–1.2)
Total Protein: 7.3 g/dL (ref 6.5–8.1)

## 2023-07-09 LAB — URINALYSIS, ROUTINE W REFLEX MICROSCOPIC
Bacteria, UA: NONE SEEN
Bilirubin Urine: NEGATIVE
Glucose, UA: NEGATIVE mg/dL
Ketones, ur: NEGATIVE mg/dL
Leukocytes,Ua: NEGATIVE
Nitrite: NEGATIVE
Protein, ur: NEGATIVE mg/dL
RBC / HPF: >50 RBC/hpf (ref 0–5)
Specific Gravity, Urine: 1.008 (ref 1.005–1.030)
pH: 6 (ref 5.0–8.0)

## 2023-07-09 LAB — SURGICAL PCR SCREEN
MRSA, PCR: NEGATIVE
Staphylococcus aureus: NEGATIVE

## 2023-07-09 LAB — SARS CORONAVIRUS 2 BY RT PCR: SARS Coronavirus 2 by RT PCR: NEGATIVE

## 2023-07-09 MED ORDER — CEFAZOLIN SODIUM-DEXTROSE 2-4 GM/100ML-% IV SOLN
2.0000 g | INTRAVENOUS | Status: AC
  Administered 2023-07-10: 2 g via INTRAVENOUS
  Filled 2023-07-09 (×2): qty 100

## 2023-07-09 MED ORDER — HEPARIN 30,000 UNITS/1000 ML (OHS) CELLSAVER SOLUTION
Status: DC
  Filled 2023-07-09 (×2): qty 1000

## 2023-07-09 MED ORDER — DEXMEDETOMIDINE HCL IN NACL 400 MCG/100ML IV SOLN
0.1000 ug/kg/h | INTRAVENOUS | Status: DC
  Filled 2023-07-09: qty 100

## 2023-07-09 MED ORDER — NOREPINEPHRINE 4 MG/250ML-% IV SOLN
0.0000 ug/min | INTRAVENOUS | Status: DC
  Filled 2023-07-09: qty 250

## 2023-07-09 MED ORDER — POTASSIUM CHLORIDE 2 MEQ/ML IV SOLN
80.0000 meq | INTRAVENOUS | Status: DC
  Filled 2023-07-09 (×2): qty 40

## 2023-07-09 MED ORDER — MAGNESIUM SULFATE 50 % IJ SOLN
40.0000 meq | INTRAMUSCULAR | Status: DC
  Filled 2023-07-09 (×2): qty 9.85

## 2023-07-09 NOTE — Progress Notes (Signed)
Patient signed all consents at PAT lab appointment. CHG soap and instructions were given to patient. CHG surgical prep reviewed with patient and all questions answered.  Patients chart send to anesthesia for review.  Pts urine sample had visible hematuria. Per pt, this is an ongoing issue that he sees Dr. Jerilee Field at [facility] for. About twice a year when he is under a lot of stress, he will have blood/blood clots in his urine and after a few days the blood is gone. He has been dealing with this for a few years. Julieta Gutting, RN with TAVR team made aware. No new orders given.

## 2023-07-09 NOTE — H&P (Signed)
[address]       [place] [address]             [phone]      Cardiothoracic Surgery Admission History and Physical   PCP is Etta Grandchild, MD Referring Provider is Alverda Skeans, MD Primary Cardiologist is Chilton Si, MD   Reason for admission: Severe aortic stenosis   HPI:   The patient is a 77 year old gentleman with a history of hypertension, hyperlipidemia, COPD with active smoking, OSA on CPAP since April 2024, peripheral arterial disease, carotid artery disease, degenerative arthritis 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  His most recent echo on 05/31/2023 showed a mean gradient across aortic valve of 41 mmHg with a peak of 61 mmHg and a valve area by VTI of 0.79 cm. The aortic valve was severely calcified and thickened with restricted leaflet mobility.  Left ventricular ejection fraction was mildly reduced at 45 to 50%.  Previous echocardiogram in January 2024 showed a mean gradient of 35 mmHg.  Ejection fraction at that time was 70%.   He reports fatigue and shortness of breath with exertion but has continued to go to the gym and exercise.  He lifts weights and uses resistance bands and does cardio.  He has continued to play golf and rides an exercise bicycle.  He has been somewhat limited due to his lower extremity claudication.  He has had occasional episodes of lightheadedness when standing up which he relates to his blood pressure medications.  He denies any syncope.  He denies any chest pain or pressure.   He lives with his wife.  He continues to smoke cigarettes.       Past Medical History:  Diagnosis Date   Anemia     Anxiety     Aortic stenosis, severe 03/09/2018               Reading Physician    Reading Date    Result Priority      Quintella Reichert, MD [phone]    07/06/2021    Routine     Narrative & Impression       The  study is normal. The study is low risk.    RBBB with repolarization changes was present at baseline with no ST changes from baseline during infusion.  Occsaional PVCs were noted.    LV perfusion is normal. There is no evidence of is   Arthritis     Basal cell carcinoma      Basal Cell X2 , Dr Mayford Knife   BPH (benign prostatic hyperplasia)     Bursitis of left hip      Dr. 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   Heart murmur      mild AI/AS 06/30/21 echo   HFrEF (heart failure with reduced ejection fraction) (HCC) 06/08/2023   Hyperlipidemia     Hypertension     Internal hemorrhoids     Leg pain 06/15/2021  PAD (peripheral artery disease) (HCC) 10/17/2021   Prostatitis      Dr Vonita Moss   RBBB     Shortness of breath 09/29/2022               Past Surgical History:  Procedure Laterality Date   COLONOSCOPY   2010    negative; [facility]   CYSTOSCOPY   10/2014    [facility]; neg   dislocation of right hip   2019   GREEN LIGHT LASER TURP (TRANSURETHRAL RESECTION OF PROSTATE   09/27/2012    Procedure: GREEN LIGHT LASER TURP (TRANSURETHRAL RESECTION OF PROSTATE;  Surgeon: [name], MD;  Location: [facility];  Service: Urology;  Laterality: N/A;       JOINT REPLACEMENT Left Oct. 12, 2012    left total hip replacement by Dr. Turner Daniels   PROSTATE BIOPSY   2007 , 2009    X2 , Dr Cleatrice Burke  & Dr Vonita Moss   PROSTATE SURGERY        Laser   RIGHT/LEFT HEART CATH AND CORONARY ANGIOGRAPHY N/A 06/18/2023    Procedure: RIGHT/LEFT HEART CATH AND CORONARY ANGIOGRAPHY;  Surgeon: [name], MD;  Location: [facility];  Service: Cardiovascular;   Laterality: N/A;   TOTAL HIP ARTHROPLASTY Right 09/26/2017   TOTAL HIP ARTHROPLASTY Right 09/26/2017    Procedure: TOTAL HIP ARTHROPLASTY ANTERIOR APPROACH;  Surgeon: Gean Birchwood, MD;  Location: [facility];  Service: Orthopedics;  Laterality: Right;   TOTAL SHOULDER ARTHROPLASTY Right 08/17/2022    Procedure: TOTAL SHOULDER ARTHROPLASTY WITH AUGMENTED GLENOID;  Surgeon: Jones Broom, MD;  Location: [facility];  Service: Orthopedics;  Laterality: Right;               Family History  Problem Relation Age of Onset   Arthritis Mother     Vascular Disease Mother          Varicose Veins   Hypertension Mother     Hyperlipidemia Mother     Transient ischemic attack Mother     Heart disease Mother          After age 26   Stroke Mother     COPD Father     Bone cancer Maternal Grandmother     Heart attack Paternal Grandmother 62   Colon cancer Son 31   Turner syndrome Daughter     Asthma Neg Hx     Diabetes Neg Hx     Pancreatic cancer Neg Hx     Esophageal cancer Neg Hx     Stomach cancer Neg Hx     Liver disease Neg Hx     Rectal cancer Neg Hx            Social History         Socioeconomic History   Marital status: Married      Spouse name: Andrey Campanile   Number of children: 2   Years of education: Not on file   Highest education level: Master's degree (e.g., MA, MS, MEng, MEd, MSW, MBA)  Occupational History   Occupation: retired  Tobacco Use   Smoking status: Former      Current packs/day: 0.00      Types: Cigarettes      Quit date: 06/14/1981      Years since quitting: 42.0   Smokeless tobacco: Never  Vaping Use   Vaping status: Never Used  Substance and Sexual Activity   Alcohol use: No      Comment: 2001-  states no rehab   Drug use: No   Sexual activity: Yes  Other Topics Concern   Not on file  Social History Narrative   Not on file    Social Determinants of Health        Financial Resource Strain: Low Risk  (04/16/2023)    Overall Financial Resource Strain  (CARDIA)     Difficulty of Paying Living Expenses: Not hard at all  Food Insecurity: No Food Insecurity (04/16/2023)    Hunger Vital Sign     Worried About Running Out of Food in the Last Year: Never true     Ran Out of Food in the Last Year: Never true  Transportation Needs: No Transportation Needs (04/16/2023)    PRAPARE - Therapist, art (Medical): No     Lack of Transportation (Non-Medical): No  Physical Activity: Sufficiently Active (04/16/2023)    Exercise Vital Sign     Days of Exercise per Week: 3 days     Minutes of Exercise per Session: 60 min  Stress: No Stress Concern Present (04/16/2023)    [employer] of Occupational Health - Occupational Stress Questionnaire     Feeling of Stress : Only a little  Social Connections: Socially Integrated (04/16/2023)    Social Connection and Isolation Panel [NHANES]     Frequency of Communication with Friends and Family: More than three times a week     Frequency of Social Gatherings with Friends and Family: Once a week     Attends Religious Services: 1 to 4 times per year     Active Member of [employer] or Organizations: No     Attends Engineer, structural: More than 4 times per year     Marital Status: Married  Catering manager Violence: Not At Risk (07/12/2022)    Humiliation, Afraid, Rape, and Kick questionnaire     Fear of Current or Ex-Partner: No     Emotionally Abused: No     Physically Abused: No     Sexually Abused: No             Prior to Admission medications   Medication Sig Start Date End Date Taking? Authorizing Provider  aspirin EC 81 MG tablet Take 81 mg by mouth daily.     Yes [provider]  cilostazol (PLETAL) 50 MG tablet TAKE 1 TABLET(50 MG) BY MOUTH TWICE DAILY 02/06/23   Yes Runell Gess, MD  famotidine (PEPCID) 20 MG tablet Take 20 mg by mouth daily as needed for heartburn or indigestion.     Yes [provider]  hydrocortisone cream 1 % Apply 1  Application topically daily as needed for itching.     Yes [provider]  metoprolol tartrate (LOPRESSOR) 50 MG tablet Take 1 tablet (50 mg total) by mouth as directed for 1 dose. Take 1 tablet two hours before your CT scan to slow your heart rate 06/14/23   Yes Janetta Hora, PA-C  Multiple Vitamins-Minerals (MULTIVITAMIN WITH MINERALS) tablet Take 1 tablet by mouth daily.     Yes [provider]  Propylene Glycol (SYSTANE BALANCE) 0.6 % SOLN Place 1 drop into both eyes as needed (dry eyes).     Yes [provider]  rosuvastatin (CRESTOR) 5 MG tablet TAKE 1 TABLET EVERY DAY 05/31/23   Yes Etta Grandchild, MD  tamsulosin (FLOMAX) 0.4 MG CAPS capsule Take 0.4 mg by mouth at bedtime. 10/09/22   Yes [provider]  telmisartan (MICARDIS) 40 MG tablet Take 1 tablet (40 mg total) by mouth daily. 04/17/23   Yes Etta Grandchild, MD  Tiotropium Bromide-Olodaterol (STIOLTO RESPIMAT) 2.5-2.5 MCG/ACT AERS Inhale 1 puff into the lungs daily.     Yes [provider]            Current Outpatient Medications  Medication Sig Dispense Refill   aspirin EC 81 MG tablet Take 81 mg by mouth daily.       cilostazol (PLETAL) 50 MG tablet TAKE 1 TABLET(50 MG) BY MOUTH TWICE DAILY 180 tablet 2   famotidine (PEPCID) 20 MG tablet Take 20 mg by mouth daily as needed for heartburn or indigestion.       hydrocortisone cream 1 % Apply 1 Application topically daily as needed for itching.       metoprolol tartrate (LOPRESSOR) 50 MG tablet Take 1 tablet (50 mg total) by mouth as directed for 1 dose. Take 1 tablet two hours before your CT scan to slow your heart rate 1 tablet 0   Multiple Vitamins-Minerals (MULTIVITAMIN WITH MINERALS) tablet Take 1 tablet by mouth daily.       Propylene Glycol (SYSTANE BALANCE) 0.6 % SOLN Place 1 drop into both eyes as needed (dry eyes).       rosuvastatin (CRESTOR) 5 MG tablet TAKE 1 TABLET EVERY DAY 90 tablet 1   tamsulosin (FLOMAX) 0.4 MG CAPS  capsule Take 0.4 mg by mouth at bedtime.       telmisartan (MICARDIS) 40 MG tablet Take 1 tablet (40 mg total) by mouth daily. 90 tablet 1   Tiotropium Bromide-Olodaterol (STIOLTO RESPIMAT) 2.5-2.5 MCG/ACT AERS Inhale 1 puff into the lungs daily.          No current facility-administered medications for this visit.        Allergies       Allergies  Allergen Reactions   Demeclocycline Nausea Only      Nausea and constipation   Pravastatin Other (See Comments)      myopathy   Lisinopril Cough   Oxycodone        Mental confusion            Review of Systems:               General:                      normal appetite, + decreased energy, no weight gain, no weight loss, no fever             Cardiac:                       n chest pain with exertion, ono chest pain at rest, +SOB with moderate exertion, no resting SOB, no PND, no orthopnea, no palpitations, no arrhythmia, no atrial fibrillation, no LE edema, + dizzy spells, no syncope             Respiratory:                 + exertional shortness of breath, no home oxygen, no productive cough, no dry cough, no bronchitis, no wheezing, no hemoptysis, no asthma, no pain with inspiration or cough, + sleep apnea, + CPAP at night             GI:  no difficulty swallowing, no reflux, no frequent heartburn, no hiatal hernia, no abdominal pain, no constipation, no diarrhea, no hematochezia, no hematemesis, no melena             GU:                              no dysuria,  no frequency, no urinary tract infection, no hematuria, + enlarged prostate, no kidney stones, no kidney disease             Vascular:                     + pain suggestive of claudication, no pain in feet, no leg cramps, no varicose veins, no DVT, no non-healing foot ulcer             Neuro:                         no stroke, no TIA's, no seizures, no headaches, no temporary blindness one eye,  no slurred speech, no peripheral neuropathy, no chronic  pain, no instability of gait, no memory/cognitive dysfunction             Musculoskeletal:         + arthritis - primarily involving the hips and shoulders, no joint swelling, no myalgias, no difficulty walking, normal mobility              Skin:                            no rash, no itching, no skin infections, no pressure sores or ulcerations             Psych:                         no anxiety, no depression, no nervousness, no unusual recent stress             Eyes:                           no blurry vision, no floaters, no recent vision changes, + wears glasses or contacts             ENT:                            no hearing loss, no loose or painful teeth, no dentures, sees dentist regularly             Hematologic:               bi easy bruising, bi abnormal bleeding, bi clotting disorder, no frequent epistaxis             Endocrine:                   no diabetes, does not check CBG's at home                            Physical Exam:               BP 128/71   Pulse 93   Resp 20   Ht 5\' 7"  (1.702 m)   Wt 164 lb (74.4 kg)  SpO2 95% Comment: RA  BMI 25.69 kg/m              General:                      well-appearing             HEENT:                       Unremarkable, NCAT, PERLA, EOMI             Neck:                           no JVD, transmitted murmur or bruit both sides of neck, no adenopathy              Chest:                          clear to auscultation, symmetrical breath sounds, no wheezes, no rhonchi              CV:                              RRR, 3/6 systolic murmur RSB, no diastolic murmur             Abdomen:                    soft, non-tender, no masses              Extremities:                 warm, well-perfused, pedal pulses not palpable, no lower extremity edema             Rectal/GU                   Deferred             Neuro:                         Grossly non-focal and symmetrical throughout             Skin:                            Clean and  dry, no rashes, no breakdown   Diagnostic Tests:   ECHOCARDIOGRAM REPORT       Patient Name:   Brian Hunt Date of Exam: 05/31/2023  Medical Rec #:  [id number]      Height:       67.0 in  Accession #:    [id number]     Weight:       162.0 lb  Date of Birth:  04/13/1946      BSA:          1.849 m  Patient Age:    76 years       BP:           132/86 mmHg  Patient Gender: M              HR:           61 bpm.  Exam Location:  Outpatient   Procedure: 2D Echo, 3D Echo, Color Doppler, Cardiac Doppler and Strain  Analysis   Indications:  Aortic Stenosis    History:        Patient has prior history of Echocardiogram examinations,  most                 recent 10/07/2022. PAD and COPD; Risk Factors:Hypertension,                  Dyslipidemia and Former Smoker. Chroic renal disease.    Sonographer:    Jeryl Columbia RDCS  Referring Phys: [id number] TIFFANY [place]   IMPRESSIONS     1. Left ventricular ejection fraction, by estimation, is 45 to 50%. Left  ventricular ejection fraction by 3D volume is 48 %. The left ventricle has  mildly decreased function. The left ventricle demonstrates global  hypokinesis. There is mild concentric  left ventricular hypertrophy. Left ventricular diastolic function could  not be evaluated.   2. Right ventricular systolic function is normal. The right ventricular  size is normal.   3. Left atrial size was moderately dilated.   4. Right atrial size was severely dilated.   5. The mitral valve is degenerative. Trivial mitral valve regurgitation.  No evidence of mitral stenosis. The mean mitral valve gradient is 4.0  mmHg. Moderate to severe mitral annular calcification.   6. The aortic valve is calcified. There is moderate calcification of the  aortic valve. There is severe thickening of the aortic valve. Aortic valve  regurgitation is mild. Severe aortic valve stenosis. Aortic valve area, by  VTI measures 0.79 cm. Aortic  valve mean gradient  measures 41.0 mmHg. Aortic valve Vmax measures 3.90  m/s.   7. The inferior vena cava is normal in size with greater than 50%  respiratory variability, suggesting right atrial pressure of 3 mmHg.   FINDINGS   Left Ventricle: Left ventricular ejection fraction, by estimation, is 45  to 50%. Left ventricular ejection fraction by 3D volume is 48 %. The left  ventricle has mildly decreased function. The left ventricle demonstrates  global hypokinesis. Global  longitudinal strain performed but not reported based on interpreter  judgement due to suboptimal tracking. The left ventricular internal cavity  size was normal in size. There is mild concentric left ventricular  hypertrophy. Left ventricular diastolic  function could not be evaluated due to mitral annular calcification  (moderate or greater). Left ventricular diastolic function could not be  evaluated.   Right Ventricle: The right ventricular size is normal. No increase in  right ventricular wall thickness. Right ventricular systolic function is  normal.   Left Atrium: Left atrial size was moderately dilated.   Right Atrium: Right atrial size was severely dilated.   Pericardium: There is no evidence of pericardial effusion. Presence of  epicardial fat layer.   Mitral Valve: The mitral valve is degenerative in appearance. Moderate to  severe mitral annular calcification. Trivial mitral valve regurgitation.  No evidence of mitral valve stenosis. MV peak gradient, 14.1 mmHg. The  mean mitral valve gradient is 4.0  mmHg.   Tricuspid Valve: The tricuspid valve is normal in structure. Tricuspid  valve regurgitation is not demonstrated. No evidence of tricuspid  stenosis.   Aortic Valve: The aortic valve is calcified. There is moderate  calcification of the aortic valve. There is severe thickening of the  aortic valve. There is moderate to severe aortic valve annular  calcification. Aortic valve regurgitation is mild. Aortic   regurgitation PHT measures 458 msec. Severe aortic stenosis is present.  Aortic valve mean gradient measures 41.0 mmHg. Aortic valve peak gradient  measures 60.8 mmHg. Aortic valve area, by VTI measures 0.79 cm.   Pulmonic Valve: The pulmonic valve was normal in structure. Pulmonic valve  regurgitation is trivial. No evidence of pulmonic stenosis.   Aorta: The aortic root is normal in size and structure.   Venous: The inferior vena cava is normal in size with greater than 50%  respiratory variability, suggesting right atrial pressure of 3 mmHg.   IAS/Shunts: No atrial level shunt detected by color flow Doppler.     LEFT VENTRICLE  PLAX 2D  LVIDd:         4.32 cm         Diastology  LVIDs:         2.18 cm         LV e' medial:    4.79 cm/s  LV PW:         1.22 cm         LV E/e' medial:  17.5  LV IVS:        1.29 cm         LV e' lateral:   4.13 cm/s  LVOT diam:     2.00 cm         LV E/e' lateral: 20.3  LV SV:         81  LV SV Index:   44  LVOT Area:     3.14 cm        3D Volume EF                                 LV 3D EF:    Left                                              ventricul                                              ar                                              ejection                                              fraction                                              by 3D                                              volume is  48 %.                                   3D Volume EF:                                 3D EF:        48 %                                 LV EDV:       120 ml                                 LV ESV:       62 ml                                 LV SV:        58 ml   RIGHT VENTRICLE  RV Basal diam:  3.47 cm  RV Mid diam:    3.03 cm  RV S prime:     9.14 cm/s  TAPSE (M-mode): 1.6 cm   LEFT ATRIUM             Index        RIGHT ATRIUM           Index  LA diam:        5.50 cm 2.97 cm/m    RA Area:     23.70 cm  LA Vol (A2C):   78.9 ml 42.67 ml/m  RA Volume:   62.50 ml  33.80 ml/m  LA Vol (A4C):   99.1 ml 53.60 ml/m  LA Biplane Vol: 91.4 ml 49.43 ml/m   AORTIC VALVE  AV Area (Vmax):    0.85 cm  AV Area (Vmean):   0.70 cm  AV Area (VTI):     0.79 cm  AV Vmax:           390.00 cm/s  AV Vmean:          312.000 cm/s  AV VTI:            1.030 m  AV Peak Grad:      60.8 mmHg  AV Mean Grad:      41.0 mmHg  LVOT Vmax:         105.00 cm/s  LVOT Vmean:        69.200 cm/s  LVOT VTI:          0.259 m  LVOT/AV VTI ratio: 0.25  AI PHT:            458 msec    AORTA  Ao Root diam: 3.20 cm  Ao Asc diam:  3.50 cm   MITRAL VALVE                TRICUSPID VALVE  MV Area (PHT): 2.45 cm     TR Peak grad:   7.4 mmHg  MV Area VTI:   1.21 cm     TR Vmax:        136.00 cm/s  MV Peak grad:  14.1 mmHg  MV Mean grad:  4.0 mmHg     SHUNTS  MV Vmax:       1.88 m/s     Systemic VTI:  0.26 m  MV Vmean:      93.1 cm/s    Systemic Diam: 2.00 cm  MV Decel Time: 310 msec  MV E velocity: 83.70 cm/s  MV A velocity: 114.00 cm/s  MV E/A ratio:  0.73   Kardie Tobb DO  Electronically signed by Thomasene Ripple DO  Signature Date/Time: 05/31/2023/12:34:37 PM        Final        Physicians   Panel Physicians Referring Physician Case Authorizing Physician  [name], MD (Primary)        Procedures   RIGHT/LEFT HEART CATH AND CORONARY ANGIOGRAPHY    Conclusion       Prox LAD lesion is 50% stenosed.   1.  Mild nonobstructive coronary artery disease. 2.  Fick cardiac output of 5.1 L/min and Fick cardiac index of 2.8 L/min/m with the following hemodynamics:             Right atrial pressure mean of 8 mmHg             Right ventricular pressure 45/2 with an end-diastolic pressure of 12 mmHg             Wedge pressure mean of 16 mmHg with V waves to 20 mmHg             PA pressure of 50/18 with a mean of 30 mmHg             PVR of 2.74 Woods units             PA pulsatility  index of 4 3.  Capacious iliofemoral vessels bilaterally.   Recommendation: Continue evaluation for aortic valve intervention.   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  He was noted to have developed severe aortic stenosis.  He is referred for coronary angiography and right heart catheterization for preprocedural assessment.  After obtaining consent the patient brought to the cardiac catheterization laboratory prepped draped sterile fashion.  Xylocaine was used to anesthetize the right wrist and a 6 French femoral glide sheath was placed there with ultrasound guidance.  5000 units heparin and 5 mg verapamil were administered through the sheath.  A previously placed right antecubital IV was exchanged for a 5 [place] Terumo glide sheath.  5 [place] JR4 diagnostic catheter and 5 [place] JL 4 diagnostic catheters were used for selective coronary angiography.  A 5 French balloontipped catheter was used for right heart catheterization.  A 6 French pigtail catheter was used for aortography.  Specifically the 6 French pigtail catheter was maneuvered to the distal abdominal aorta and aortography and bilateral iliofemoral angiography was performed.  A TR band was placed and manual pressure applied to the antecubital site.  There were no acute complications. Estimated blood loss <50 mL.   During this procedure medications were administered to achieve and maintain moderate conscious sedation while the patient's heart rate, blood pressure, and oxygen saturation were continuously monitored and I was present face-to-face 100% of  this time.    Medications (Filter: Administrations occurring from 0759 to  0912 on 06/18/23)  important  Continuous medications are totaled by the amount administered until 06/18/23 0912.    Heparin (Porcine) in NaCl 1000-0.9 UT/500ML-% SOLN (mL)  Total volume: 1,000 mL Date/Time Rate/Dose/Volume Action    06/18/23 0805 500 mL Given    0806 500 mL Given    fentaNYL (SUBLIMAZE) injection (mcg)  Total dose: 25 mcg Date/Time Rate/Dose/Volume Action    06/18/23 0822 25 mcg Given    midazolam (VERSED) injection (mg)  Total dose: 1 mg Date/Time Rate/Dose/Volume Action    06/18/23 0822 1 mg Given    lidocaine (PF) (XYLOCAINE) 1 % injection (mL)  Total volume: 4 mL Date/Time Rate/Dose/Volume Action    06/18/23 0826 2 mL Given    0826 2 mL Given    Radial Cocktail/Verapamil only (mL)  Total volume: 10 mL Date/Time Rate/Dose/Volume Action    06/18/23 0828 10 mL Given    heparin sodium (porcine) injection (Units)  Total dose: 5,000 Units Date/Time Rate/Dose/Volume Action    06/18/23 0828 5,000 Units Given    iohexol (OMNIPAQUE) 350 MG/ML injection (mL)  Total volume: 100 mL Date/Time Rate/Dose/Volume Action    06/18/23 0901 100 mL Given      Sedation Time   Sedation Time Physician-1: 35 minutes 35 seconds Contrast        Administrations occurring from 0759 to 0912 on 06/18/23:  Medication Name Total Dose  iohexol (OMNIPAQUE) 350 MG/ML injection 100 mL    Radiation/Fluoro   Fluoro time: 7 (min) DAP: 17.4 (Gycm2) Cumulative Air Kerma: 240.8 (mGy) Complications   Complications documented before study signed (06/18/2023  9:22 AM)    No complications were associated with this study.  Documented by Ancil Linsey, RT - 06/18/2023  9:05 AM      Coronary Findings   Diagnostic Dominance: Right Left Anterior Descending  There is mild diffuse disease throughout the vessel.  Prox LAD lesion is 50% stenosed.    Left Circumflex  There is mild diffuse disease throughout the  vessel.    Right Coronary Artery  There is mild diffuse disease throughout the vessel.    Intervention    No interventions have been documented.    Coronary Diagrams   Diagnostic Dominance: Right  Intervention    Implants    No implant documentation for this case.    Syngo Images    Show images for CARDIAC CATHETERIZATION Images on Long Term Storage    Show images for [place], [place] to Procedure Log   Procedure Log    Link to Procedure Log   Procedure Log    Hemo Data   Flowsheet Row Most Recent Value  Fick Cardiac Output 5.06 L/min  Fick Cardiac Output Index 2.75 (L/min)/BSA  RA A Wave 13 mmHg  RA V Wave 11 mmHg  RA Mean 8 mmHg  RV Systolic Pressure 45 mmHg  RV Diastolic Pressure 2 mmHg  RV EDP 12 mmHg  PA Systolic Pressure 50 mmHg  PA Diastolic Pressure 18 mmHg  PA Mean 30 mmHg  PW A Wave 20 mmHg  PW V Wave 17 mmHg  PW Mean 16 mmHg  AO Systolic Pressure 167 mmHg  AO Diastolic Pressure 26 mmHg  AO Mean 138 mmHg  QP/QS 0.87  TPVR Index 12.61 HRUI  TSVR Index 50.27 HRUI  PVR SVR Ratio 0.12  TPVR/TSVR Ratio 0.25      Narrative & Impression  CLINICAL DATA:  Aortic valve replacement (TAVR), pre-op eval   EXAM: Cardiac TAVR CT  TECHNIQUE: The patient was scanned on a Siemens Force 192 slice scanner. A 120 kV retrospective scan was triggered in the descending thoracic aorta at 111 HU's. Gantry rotation speed was 270 msecs and collimation was .9 mm. The 3D data set was reconstructed in 5% intervals of the R-R cycle. Systolic and diastolic phases were analyzed on a dedicated work station using MPR, MIP and VRT modes. The patient received 75mL OMNIPAQUE IOHEXOL 350 MG/ML SOLN of contrast.   FINDINGS: Aortic Valve:   Tricuspid aortic valve with severely reduced cusp excursion. Severely thickened and severely calcified aortic valve cusps.   AV calcium score: 3608   Virtual Basal Annulus Measurements:   Maximum/Minimum Diameter: 30.2 x  26.7 mm   Perimeter: 88.4 mm   Area:  598 mm2   Mild LVOT calcifications under LCC at level of annulus.   Membranous septal length: 8 mm   Based on these measurements, the annulus would be suitable for a 29 mm Sapien 3 valve. Alternatively, Heart Team can consider 34 mm Evolut valve. Recommend Heart Team discussion for valve selection.   Sinus of Valsalva Measurements:   Non-coronary:  36 mm   Right - coronary:  36 mm   Left - coronary:  37 mm   Sinus of Valsalva Height:   Left: 27.8 mm   Right: 27.1 mm   Aorta: Conventional 3 vessel branch pattern of aortic arch. Severe atherosclerosis of the arch vessels.   Sinotubular Junction:  32 mm   Ascending Thoracic Aorta:  35 mm   Aortic Arch:  32 mm   Descending Thoracic Aorta:  27 mm   Coronary Artery Height above Annulus:   Left main: 21.7 mm   Right coronary: 22.3 mm   Coronary Arteries: Normal coronary origin. Right dominance. The study was performed without use of NTG and insufficient for plaque evaluation. Coronary artery calcium seen in 3 vessel distribution.   Optimum Fluoroscopic Angle for Delivery: LAO 22 CAU 12   OTHER:   Atria: Biatrial enlargement   Left atrial appendage: No thrombus.   Mitral valve: Grossly normal, mild mitral annular calcifications.   Pulmonary artery: Mild dilation, 33 mm.   Pulmonary veins: Normal anatomy.   IMPRESSION: 1. Tricuspid aortic valve with severely reduced cusp excursion. Severely thickened and severely calcified aortic valve cusps. 2. Aortic valve calcium score: 3608 3. Annulus area: 598 mm2, suitable for 29 mm Sapien 3 valve. Mild LVOT calcifications. Membranous septal length 8 mm. 4. Sufficient coronary artery heights from annulus. 5. Optimum fluoroscopic angle for delivery: LAO 22 CAU 12     Electronically Signed   By: Weston Brass M.D.   On: 07/02/2023 16:41        Impression:   This 77 year old gentleman has stage D, severe, symptomatic  aortic stenosis with NYHA class II symptoms of exertional fatigue and shortness of breath consistent with chronic diastolic congestive heart failure.  He has had occasional episodes of lightheadedness with standing.  I have personally reviewed his 2D echo, cardiac catheterization, and CTA studies.  His echo shows a severely calcified and thickened aortic valve with restricted leaflet mobility.  The mean gradient is 41 mmHg with a valve area by VTI of 0.79 cm consistent with severe aortic stenosis.  His left ventricular ejection fraction is mildly decreased to 45 to 50% compared to his echo in January 2024 when it was 70%.  Cardiac catheterization shows mild nonobstructive coronary disease.  I agree that aortic valve replacement is the best treatment for  relief of his symptoms and to prevent further left ventricular dysfunction.  Given his age and comorbidities I think that transcatheter aortic valve replacement would be the best treatment option for him.  His gated cardiac CTA shows anatomy suitable for TAVR using a 29 mm SAPIEN 3 valve.  His abdominal and pelvic CTA shows extensive aortoiliac calcific disease but I think there is adequate transfemoral access on the right side.  His aortogram during catheterization showed fairly large iliac vessels with no significant stenosis.  His left subclavian artery also appears suitable for access although there is some calcified plaque at the ostium without significant narrowing.  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   The patient and his wife were counseled at length regarding treatment alternatives for management of severe symptomatic aortic stenosis. The risks and benefits of surgical intervention has been discussed in detail. Long-term prognosis with medical therapy was discussed. Alternative approaches such as  conventional surgical aortic valve replacement, transcatheter aortic valve replacement, and palliative medical therapy were compared and contrasted at length. This discussion was placed in the context of the patient's own specific clinical presentation and past medical history. All of their questions have been addressed.    Following the decision to proceed with transcatheter aortic valve replacement, a discussion was held regarding what types of management strategies would be attempted intraoperatively in the event of life-threatening complications, including whether or not the patient would be considered a candidate for the use of cardiopulmonary bypass and/or conversion to open sternotomy for attempted surgical intervention.  I think he would be a candidate for emergent sternotomy to manage limited intraoperative complications such as ventricular perforation but I do not think he would be a candidate for aortic replacement given the degree of calcification of his aortic arch and great vessels. The patient has been advised of a variety of complications that might develop including but not limited to risks of death, stroke, paravalvular leak, aortic dissection or other major vascular complications, aortic annulus rupture, device embolization, cardiac rupture or perforation, mitral regurgitation, acute myocardial infarction, arrhythmia, heart block or bradycardia requiring permanent pacemaker placement, congestive heart failure, respiratory failure, renal failure, pneumonia, infection, other late complications related to structural valve deterioration or migration, or other complications that might ultimately cause a temporary or permanent loss of functional independence or other long term morbidity. The patient provides full informed consent for the procedure as described and all questions were answered.       Plan:   Transfemoral TAVR using a SAPIEN 3 valve with left subclavian artery backup.         [name], MD

## 2023-07-10 ENCOUNTER — Encounter (HOSPITAL_COMMUNITY): Payer: Self-pay | Admitting: Cardiovascular Disease

## 2023-07-10 ENCOUNTER — Inpatient Hospital Stay (HOSPITAL_COMMUNITY): Payer: Medicare PPO

## 2023-07-10 ENCOUNTER — Other Ambulatory Visit: Payer: Self-pay | Admitting: Physician Assistant

## 2023-07-10 ENCOUNTER — Encounter (HOSPITAL_COMMUNITY)
Admission: RE | Disposition: A | Discharge status: Home / Self Care | Payer: Self-pay | Source: Ambulatory Visit | Attending: Cardiovascular Disease

## 2023-07-10 ENCOUNTER — Inpatient Hospital Stay (HOSPITAL_COMMUNITY)
Admission: RE | Admit: 2023-07-10 | Discharge: 2023-07-12 | DRG: 267 | Disposition: A | Discharge status: Home / Self Care | Payer: Medicare PPO | Attending: Cardiovascular Disease | Admitting: Cardiovascular Disease

## 2023-07-10 ENCOUNTER — Inpatient Hospital Stay (HOSPITAL_COMMUNITY): Payer: Medicare PPO | Admitting: Certified Registered"

## 2023-07-10 DIAGNOSIS — Z7902 Long term (current) use of antithrombotics/antiplatelets: Secondary | ICD-10-CM

## 2023-07-10 DIAGNOSIS — G4733 Obstructive sleep apnea (adult) (pediatric): Secondary | ICD-10-CM | POA: Diagnosis present

## 2023-07-10 DIAGNOSIS — Z8719 Personal history of other diseases of the digestive system: Secondary | ICD-10-CM

## 2023-07-10 DIAGNOSIS — E785 Hyperlipidemia, unspecified: Secondary | ICD-10-CM | POA: Diagnosis present

## 2023-07-10 DIAGNOSIS — I493 Ventricular premature depolarization: Secondary | ICD-10-CM | POA: Diagnosis present

## 2023-07-10 DIAGNOSIS — J449 Chronic obstructive pulmonary disease, unspecified: Secondary | ICD-10-CM | POA: Diagnosis present

## 2023-07-10 DIAGNOSIS — I350 Nonrheumatic aortic (valve) stenosis: Principal | ICD-10-CM

## 2023-07-10 DIAGNOSIS — D6489 Other specified anemias: Secondary | ICD-10-CM | POA: Diagnosis not present

## 2023-07-10 DIAGNOSIS — Z825 Family history of asthma and other chronic lower respiratory diseases: Secondary | ICD-10-CM

## 2023-07-10 DIAGNOSIS — N401 Enlarged prostate with lower urinary tract symptoms: Secondary | ICD-10-CM | POA: Diagnosis present

## 2023-07-10 DIAGNOSIS — F419 Anxiety disorder, unspecified: Secondary | ICD-10-CM | POA: Diagnosis present

## 2023-07-10 DIAGNOSIS — Z1152 Encounter for screening for COVID-19: Secondary | ICD-10-CM

## 2023-07-10 DIAGNOSIS — I358 Other nonrheumatic aortic valve disorders: Secondary | ICD-10-CM | POA: Diagnosis present

## 2023-07-10 DIAGNOSIS — I5042 Chronic combined systolic (congestive) and diastolic (congestive) heart failure: Secondary | ICD-10-CM | POA: Diagnosis present

## 2023-07-10 DIAGNOSIS — Z8679 Personal history of other diseases of the circulatory system: Secondary | ICD-10-CM

## 2023-07-10 DIAGNOSIS — Z808 Family history of malignant neoplasm of other organs or systems: Secondary | ICD-10-CM

## 2023-07-10 DIAGNOSIS — I5022 Chronic systolic (congestive) heart failure: Secondary | ICD-10-CM | POA: Diagnosis present

## 2023-07-10 DIAGNOSIS — Z006 Encounter for examination for normal comparison and control in clinical research program: Secondary | ICD-10-CM | POA: Diagnosis not present

## 2023-07-10 DIAGNOSIS — Z952 Presence of prosthetic heart valve: Secondary | ICD-10-CM

## 2023-07-10 DIAGNOSIS — R011 Cardiac murmur, unspecified: Secondary | ICD-10-CM | POA: Diagnosis present

## 2023-07-10 DIAGNOSIS — Z8249 Family history of ischemic heart disease and other diseases of the circulatory system: Secondary | ICD-10-CM

## 2023-07-10 DIAGNOSIS — I442 Atrioventricular block, complete: Secondary | ICD-10-CM | POA: Diagnosis present

## 2023-07-10 DIAGNOSIS — Z83438 Family history of other disorder of lipoprotein metabolism and other lipidemia: Secondary | ICD-10-CM

## 2023-07-10 DIAGNOSIS — I2510 Atherosclerotic heart disease of native coronary artery without angina pectoris: Secondary | ICD-10-CM | POA: Diagnosis present

## 2023-07-10 DIAGNOSIS — N400 Enlarged prostate without lower urinary tract symptoms: Secondary | ICD-10-CM | POA: Diagnosis present

## 2023-07-10 DIAGNOSIS — I739 Peripheral vascular disease, unspecified: Secondary | ICD-10-CM | POA: Diagnosis present

## 2023-07-10 DIAGNOSIS — E041 Nontoxic single thyroid nodule: Secondary | ICD-10-CM | POA: Diagnosis present

## 2023-07-10 DIAGNOSIS — Z9079 Acquired absence of other genital organ(s): Secondary | ICD-10-CM

## 2023-07-10 DIAGNOSIS — I110 Hypertensive heart disease with heart failure: Secondary | ICD-10-CM | POA: Diagnosis present

## 2023-07-10 DIAGNOSIS — Z96611 Presence of right artificial shoulder joint: Secondary | ICD-10-CM | POA: Diagnosis present

## 2023-07-10 DIAGNOSIS — I5020 Unspecified systolic (congestive) heart failure: Secondary | ICD-10-CM | POA: Diagnosis present

## 2023-07-10 DIAGNOSIS — Z888 Allergy status to other drugs, medicaments and biological substances status: Secondary | ICD-10-CM

## 2023-07-10 DIAGNOSIS — Z8673 Personal history of transient ischemic attack (TIA), and cerebral infarction without residual deficits: Secondary | ICD-10-CM

## 2023-07-10 DIAGNOSIS — R338 Other retention of urine: Secondary | ICD-10-CM | POA: Diagnosis present

## 2023-07-10 DIAGNOSIS — F17210 Nicotine dependence, cigarettes, uncomplicated: Secondary | ICD-10-CM | POA: Diagnosis present

## 2023-07-10 DIAGNOSIS — Z96643 Presence of artificial hip joint, bilateral: Secondary | ICD-10-CM | POA: Diagnosis present

## 2023-07-10 DIAGNOSIS — Z01818 Encounter for other preprocedural examination: Secondary | ICD-10-CM

## 2023-07-10 DIAGNOSIS — M1990 Unspecified osteoarthritis, unspecified site: Secondary | ICD-10-CM | POA: Diagnosis present

## 2023-07-10 DIAGNOSIS — Z7982 Long term (current) use of aspirin: Secondary | ICD-10-CM

## 2023-07-10 DIAGNOSIS — I4510 Unspecified right bundle-branch block: Secondary | ICD-10-CM

## 2023-07-10 DIAGNOSIS — E871 Hypo-osmolality and hyponatremia: Secondary | ICD-10-CM | POA: Diagnosis present

## 2023-07-10 DIAGNOSIS — Z9889 Other specified postprocedural states: Secondary | ICD-10-CM

## 2023-07-10 DIAGNOSIS — Z950 Presence of cardiac pacemaker: Secondary | ICD-10-CM | POA: Insufficient documentation

## 2023-07-10 DIAGNOSIS — Z85828 Personal history of other malignant neoplasm of skin: Secondary | ICD-10-CM

## 2023-07-10 DIAGNOSIS — Z881 Allergy status to other antibiotic agents status: Secondary | ICD-10-CM

## 2023-07-10 DIAGNOSIS — Z823 Family history of stroke: Secondary | ICD-10-CM

## 2023-07-10 DIAGNOSIS — Z800 Family history of malignant neoplasm of digestive organs: Secondary | ICD-10-CM

## 2023-07-10 DIAGNOSIS — Z79899 Other long term (current) drug therapy: Secondary | ICD-10-CM

## 2023-07-10 DIAGNOSIS — Z8261 Family history of arthritis: Secondary | ICD-10-CM

## 2023-07-10 DIAGNOSIS — M549 Dorsalgia, unspecified: Secondary | ICD-10-CM | POA: Diagnosis not present

## 2023-07-10 DIAGNOSIS — Z885 Allergy status to narcotic agent status: Secondary | ICD-10-CM

## 2023-07-10 HISTORY — PX: TEMPORARY PACEMAKER: CATH118268

## 2023-07-10 HISTORY — PX: INTRAOPERATIVE TRANSTHORACIC ECHOCARDIOGRAM: SHX6523

## 2023-07-10 HISTORY — PX: TRANSCATHETER AORTIC VALVE REPLACEMENT,SUBCLAVIAN: CATH118319

## 2023-07-10 HISTORY — DX: Presence of prosthetic heart valve: Z95.2

## 2023-07-10 LAB — POCT I-STAT, CHEM 8
BUN: 10 mg/dL (ref 8–23)
BUN: 10 mg/dL (ref 8–23)
Calcium, Ion: 1.2 mmol/L (ref 1.15–1.40)
Calcium, Ion: 1.17 mmol/L (ref 1.15–1.40)
Chloride: 98 mmol/L (ref 98–111)
Chloride: 100 mmol/L (ref 98–111)
Creatinine, Ser: 0.7 mg/dL (ref 0.61–1.24)
Creatinine, Ser: 0.7 mg/dL (ref 0.61–1.24)
Glucose, Bld: 104 mg/dL — ABNORMAL HIGH (ref 70–99)
Glucose, Bld: 100 mg/dL — ABNORMAL HIGH (ref 70–99)
HCT: 36 % — ABNORMAL LOW (ref 39.0–52.0)
HCT: 38 % — ABNORMAL LOW (ref 39.0–52.0)
Hemoglobin: 12.2 g/dL — ABNORMAL LOW (ref 13.0–17.0)
Hemoglobin: 12.9 g/dL — ABNORMAL LOW (ref 13.0–17.0)
Potassium: 4.2 mmol/L (ref 3.5–5.1)
Potassium: 4.1 mmol/L (ref 3.5–5.1)
Sodium: 135 mmol/L (ref 135–145)
Sodium: 134 mmol/L — ABNORMAL LOW (ref 135–145)
TCO2: 24 mmol/L (ref 22–32)
TCO2: 22 mmol/L (ref 22–32)

## 2023-07-10 LAB — CBC
HCT: 40.7 % (ref 39.0–52.0)
Hemoglobin: 13.6 g/dL (ref 13.0–17.0)
MCH: 32.4 pg (ref 26.0–34.0)
MCHC: 33.4 g/dL (ref 30.0–36.0)
MCV: 96.9 fL (ref 80.0–100.0)
Platelets: 239 10*3/uL (ref 150–400)
RBC: 4.2 MIL/uL — ABNORMAL LOW (ref 4.22–5.81)
RDW: 13.4 % (ref 11.5–15.5)
WBC: 11.6 10*3/uL — ABNORMAL HIGH (ref 4.0–10.5)
nRBC: 0 % (ref 0.0–0.2)

## 2023-07-10 LAB — ECHOCARDIOGRAM LIMITED
AR max vel: 2.22 cm2
AV Area VTI: 2.21 cm2
AV Area mean vel: 2.2 cm2
AV Mean grad: 11 mm[Hg]
AV Peak grad: 15.9 mm[Hg]
Ao pk vel: 2 m/s
Est EF: 55
P 1/2 time: 491 ms

## 2023-07-10 LAB — GLUCOSE, CAPILLARY: Glucose-Capillary: 112 mg/dL — ABNORMAL HIGH (ref 70–99)

## 2023-07-10 SURGERY — TRANSCATHETER AORTIC VALVE REPLACEMENT, TRANSFEMORAL (CATHLAB)
Anesthesia: Monitor Anesthesia Care | Laterality: Right

## 2023-07-10 MED ORDER — PROPOFOL 500 MG/50ML IV EMUL
INTRAVENOUS | Status: DC | PRN
  Administered 2023-07-10: 40 ug/kg/min via INTRAVENOUS

## 2023-07-10 MED ORDER — HEPARIN SODIUM (PORCINE) 1000 UNIT/ML IJ SOLN
INTRAMUSCULAR | Status: DC | PRN
  Administered 2023-07-10: 10000 [IU] via INTRAVENOUS

## 2023-07-10 MED ORDER — SODIUM CHLORIDE 0.9 % IV SOLN: INTRAVENOUS | Status: DC

## 2023-07-10 MED ORDER — CHLORHEXIDINE GLUCONATE 4 % EX SOLN: 60.0000 mL | Freq: Once | CUTANEOUS | Status: DC

## 2023-07-10 MED ORDER — CEFAZOLIN SODIUM-DEXTROSE 2-4 GM/100ML-% IV SOLN
2.0000 g | Freq: Three times a day (TID) | INTRAVENOUS | Status: AC
  Administered 2023-07-10 – 2023-07-11 (×2): 2 g via INTRAVENOUS
  Filled 2023-07-10 (×2): qty 100

## 2023-07-10 MED ORDER — TRAMADOL HCL 50 MG PO TABS
50.0000 mg | ORAL_TABLET | ORAL | Status: DC | PRN
  Administered 2023-07-10 – 2023-07-12 (×2): 100 mg via ORAL
  Filled 2023-07-10 (×2): qty 2

## 2023-07-10 MED ORDER — EPHEDRINE SULFATE-NACL 50-0.9 MG/10ML-% IV SOSY
PREFILLED_SYRINGE | INTRAVENOUS | Status: DC | PRN
  Administered 2023-07-10: 10 mg via INTRAVENOUS

## 2023-07-10 MED ORDER — FENTANYL CITRATE (PF) 100 MCG/2ML IJ SOLN
INTRAMUSCULAR | Status: AC
  Filled 2023-07-10: qty 2

## 2023-07-10 MED ORDER — NITROGLYCERIN IN D5W 200-5 MCG/ML-% IV SOLN
0.0000 ug/min | INTRAVENOUS | Status: DC
Start: 2023-07-10 — End: 2023-07-12

## 2023-07-10 MED ORDER — ACETAMINOPHEN 650 MG RE SUPP: 650.0000 mg | Freq: Four times a day (QID) | RECTAL | Status: DC | PRN

## 2023-07-10 MED ORDER — MORPHINE SULFATE (PF) 2 MG/ML IV SOLN
1.0000 mg | INTRAVENOUS | Status: DC | PRN
  Administered 2023-07-10 (×3): 2 mg via INTRAVENOUS
  Administered 2023-07-10: 4 mg via INTRAVENOUS
  Filled 2023-07-10: qty 1
  Filled 2023-07-10: qty 2

## 2023-07-10 MED ORDER — HEPARIN (PORCINE) IN NACL 1000-0.9 UT/500ML-% IV SOLN
INTRAVENOUS | Status: DC | PRN
  Administered 2023-07-10 (×2): 500 mL

## 2023-07-10 MED ORDER — FENTANYL CITRATE (PF) 100 MCG/2ML IJ SOLN: 25.0000 ug | INTRAMUSCULAR | Status: DC | PRN

## 2023-07-10 MED ORDER — GLYCOPYRROLATE PF 0.2 MG/ML IJ SOSY
PREFILLED_SYRINGE | INTRAMUSCULAR | Status: DC | PRN
  Administered 2023-07-10: .2 mg via INTRAVENOUS

## 2023-07-10 MED ORDER — PHENYLEPHRINE HCL-NACL 20-0.9 MG/250ML-% IV SOLN
INTRAVENOUS | Status: DC | PRN
  Administered 2023-07-10: 50 ug/min via INTRAVENOUS

## 2023-07-10 MED ORDER — LACTATED RINGERS IV SOLN: INTRAVENOUS | Status: DC | PRN

## 2023-07-10 MED ORDER — SODIUM CHLORIDE 0.9% FLUSH
3.0000 mL | Freq: Two times a day (BID) | INTRAVENOUS | Status: DC
  Administered 2023-07-10 – 2023-07-12 (×3): 3 mL via INTRAVENOUS

## 2023-07-10 MED ORDER — LIDOCAINE HCL (PF) 1 % IJ SOLN
INTRAMUSCULAR | Status: DC | PRN
  Administered 2023-07-10: 20 mL
  Administered 2023-07-10: 5 mL

## 2023-07-10 MED ORDER — MORPHINE SULFATE (PF) 2 MG/ML IV SOLN
INTRAVENOUS | Status: AC
  Filled 2023-07-10: qty 1

## 2023-07-10 MED ORDER — CHLORHEXIDINE GLUCONATE 4 % EX SOLN: 30.0000 mL | CUTANEOUS | Status: DC

## 2023-07-10 MED ORDER — ACETAMINOPHEN 10 MG/ML IV SOLN: 1000.0000 mg | Freq: Once | INTRAVENOUS | Status: DC | PRN

## 2023-07-10 MED ORDER — SODIUM CHLORIDE 0.9 % IV SOLN: INTRAVENOUS | Status: AC

## 2023-07-10 MED ORDER — ORAL CARE MOUTH RINSE
15.0000 mL | OROMUCOSAL | Status: DC | PRN
Start: 2023-07-10 — End: 2023-07-12

## 2023-07-10 MED ORDER — TAMSULOSIN HCL 0.4 MG PO CAPS
0.4000 mg | ORAL_CAPSULE | Freq: Every day | ORAL | Status: DC
  Administered 2023-07-10 – 2023-07-11 (×2): 0.4 mg via ORAL
  Filled 2023-07-10 (×2): qty 1

## 2023-07-10 MED ORDER — CILOSTAZOL 50 MG PO TABS
50.0000 mg | ORAL_TABLET | Freq: Two times a day (BID) | ORAL | Status: DC
  Administered 2023-07-10 – 2023-07-12 (×4): 50 mg via ORAL
  Filled 2023-07-10 (×4): qty 1

## 2023-07-10 MED ORDER — ACETAMINOPHEN 160 MG/5ML PO SOLN: 1000.0000 mg | Freq: Once | ORAL | Status: DC | PRN

## 2023-07-10 MED ORDER — ONDANSETRON HCL 4 MG/2ML IJ SOLN
4.0000 mg | Freq: Four times a day (QID) | INTRAMUSCULAR | Status: DC | PRN
  Administered 2023-07-10: 4 mg via INTRAVENOUS
  Filled 2023-07-10: qty 2

## 2023-07-10 MED ORDER — CHLORHEXIDINE GLUCONATE 0.12 % MT SOLN
15.0000 mL | Freq: Once | OROMUCOSAL | Status: AC
  Administered 2023-07-10: 15 mL via OROMUCOSAL
  Filled 2023-07-10 (×2): qty 15

## 2023-07-10 MED ORDER — DEXMEDETOMIDINE HCL IN NACL 80 MCG/20ML IV SOLN
INTRAVENOUS | Status: DC | PRN
  Administered 2023-07-10: 8 ug via INTRAVENOUS
  Administered 2023-07-10: 4 ug via INTRAVENOUS
  Administered 2023-07-10: 8 ug via INTRAVENOUS
  Administered 2023-07-10: 12 ug via INTRAVENOUS
  Administered 2023-07-10: 8 ug via INTRAVENOUS

## 2023-07-10 MED ORDER — ONDANSETRON HCL 4 MG/2ML IJ SOLN
INTRAMUSCULAR | Status: DC | PRN
  Administered 2023-07-10: 4 mg via INTRAVENOUS

## 2023-07-10 MED ORDER — HYDROMORPHONE HCL 1 MG/ML IJ SOLN
1.0000 mg | INTRAMUSCULAR | Status: DC | PRN
  Administered 2023-07-11 (×2): 1 mg via INTRAVENOUS
  Filled 2023-07-10 (×2): qty 1

## 2023-07-10 MED ORDER — HEPARIN SODIUM (PORCINE) 1000 UNIT/ML IJ SOLN
INTRAMUSCULAR | Status: AC
  Filled 2023-07-10: qty 10

## 2023-07-10 MED ORDER — PROPOFOL 10 MG/ML IV BOLUS
INTRAVENOUS | Status: DC | PRN
  Administered 2023-07-10 (×3): 20 mg via INTRAVENOUS

## 2023-07-10 MED ORDER — SODIUM CHLORIDE 0.9% FLUSH: 3.0000 mL | INTRAVENOUS | Status: DC | PRN

## 2023-07-10 MED ORDER — SODIUM CHLORIDE 0.9 % IV SOLN
250.0000 mL | INTRAVENOUS | Status: DC | PRN
  Administered 2023-07-11: 250 mL via INTRAVENOUS

## 2023-07-10 MED ORDER — PROTAMINE SULFATE 10 MG/ML IV SOLN
INTRAVENOUS | Status: DC | PRN
  Administered 2023-07-10 (×2): 10 mg via INTRAVENOUS
  Administered 2023-07-10: 20 mg via INTRAVENOUS

## 2023-07-10 MED ORDER — CHLORHEXIDINE GLUCONATE CLOTH 2 % EX PADS
6.0000 | MEDICATED_PAD | Freq: Every day | CUTANEOUS | Status: DC
Start: 2023-07-10 — End: 2023-07-12
  Administered 2023-07-10 – 2023-07-12 (×3): 6 via TOPICAL

## 2023-07-10 MED ORDER — ROSUVASTATIN CALCIUM 5 MG PO TABS
5.0000 mg | ORAL_TABLET | Freq: Every day | ORAL | Status: DC
  Administered 2023-07-10 – 2023-07-12 (×3): 5 mg via ORAL
  Filled 2023-07-10 (×3): qty 1

## 2023-07-10 MED ORDER — ACETAMINOPHEN 500 MG PO TABS: 1000.0000 mg | ORAL_TABLET | Freq: Once | ORAL | Status: DC | PRN

## 2023-07-10 MED ORDER — ASPIRIN 81 MG PO TBEC
81.0000 mg | DELAYED_RELEASE_TABLET | Freq: Every day | ORAL | Status: DC
  Administered 2023-07-10 – 2023-07-12 (×3): 81 mg via ORAL
  Filled 2023-07-10 (×3): qty 1

## 2023-07-10 MED ORDER — CEFAZOLIN SODIUM-DEXTROSE 2-4 GM/100ML-% IV SOLN
INTRAVENOUS | Status: AC
  Administered 2023-07-10: 2 g via INTRAVENOUS
  Filled 2023-07-10: qty 100

## 2023-07-10 MED ORDER — ACETAMINOPHEN 325 MG PO TABS
ORAL_TABLET | ORAL | Status: AC
  Filled 2023-07-10: qty 2

## 2023-07-10 MED ORDER — IOHEXOL 350 MG/ML SOLN
INTRAVENOUS | Status: DC | PRN
  Administered 2023-07-10: 30 mL

## 2023-07-10 MED ORDER — ACETAMINOPHEN 325 MG PO TABS
650.0000 mg | ORAL_TABLET | Freq: Four times a day (QID) | ORAL | Status: DC | PRN
  Administered 2023-07-10: 650 mg via ORAL

## 2023-07-10 MED ORDER — FENTANYL CITRATE (PF) 100 MCG/2ML IJ SOLN
INTRAMUSCULAR | Status: DC | PRN
  Administered 2023-07-10 (×4): 25 ug via INTRAVENOUS

## 2023-07-10 SURGICAL SUPPLY — 32 items
BAG SNAP BAND KOVER 36X36 (MISCELLANEOUS) ×6 IMPLANT
CABLE ADAPT PACING TEMP 12FT (ADAPTER) IMPLANT
CATH COMMANDER DELIVERY SYS 29 (CATHETERS) IMPLANT
CATH DIAG 6FR PIGTAIL ANGLED (CATHETERS) IMPLANT
CATH INFINITI 5FR ANG PIGTAIL (CATHETERS) IMPLANT
CATH INFINITI 6F AL2 (CATHETERS) IMPLANT
CATH S G BIP PACING (CATHETERS) IMPLANT
CLOSURE MYNX CONTROL 6F/7F (Vascular Products) IMPLANT
CLOSURE PERCLOSE PROSTYLE (VASCULAR PRODUCTS) IMPLANT
CRIMPER (MISCELLANEOUS) IMPLANT
DEVICE INFLATION ATRION QL38 (MISCELLANEOUS) IMPLANT
KIT MICROPUNCTURE NIT STIFF (SHEATH) IMPLANT
KIT SAPIAN 3 ULTRA RESILIA 29 (Valve) IMPLANT
PACK CARDIAC CATHETERIZATION (CUSTOM PROCEDURE TRAY) ×3 IMPLANT
SET ATX-X65L (MISCELLANEOUS) IMPLANT
SHEATH BRITE TIP 7FR 35CM (SHEATH) IMPLANT
SHEATH CATAPULT 6FR 45 (SHEATH) IMPLANT
SHEATH INTRODUCER SET 29 (SHEATH) IMPLANT
SHEATH PINNACLE 6F 10CM (SHEATH) IMPLANT
SHEATH PINNACLE 7F 10CM (SHEATH) IMPLANT
SHEATH PINNACLE 8F 10CM (SHEATH) IMPLANT
SHEATH PROBE COVER 6X72 (BAG) IMPLANT
SHIELD CATH-GARD CONTAMINATION (MISCELLANEOUS) IMPLANT
STOPCOCK MORSE 400PSI 3WAY (MISCELLANEOUS) ×6 IMPLANT
TUBING ART PRESS 72 MALE/FEM (TUBING) IMPLANT
WIRE AMPLATZ SS-J .035X180CM (WIRE) IMPLANT
WIRE EMERALD 3MM-J .035X150CM (WIRE) IMPLANT
WIRE EMERALD 3MM-J .035X260CM (WIRE) IMPLANT
WIRE EMERALD ST .035X260CM (WIRE) IMPLANT
WIRE PACING TEMP ST TIP 5 (CATHETERS) IMPLANT
WIRE SAFARI SM CURVE 275 (WIRE) IMPLANT
WIRE TORQFLEX AUST .018X40CM (WIRE) IMPLANT

## 2023-07-10 NOTE — Op Note (Signed)
[facility]   MULTIDISCIPLINARY HEART VALVE TEAM   TAVR OPERATIVE NOTE   Date of Procedure:  07/10/2023  Preoperative Diagnosis: Severe Aortic Stenosis   Postoperative Diagnosis: Same   Procedure:   Transcatheter Aortic Valve Replacement - Percutaneous Right Transfemoral Approach  Korver Sapien 3 Ultra Resilia THV (size 29 mm, model # 9755RSL, serial # [id number])   Co-Surgeons:  [name], MD and Tonny Bollman, MD   Anesthesiologist:  C. Maple Hudson, MD  Echocardiographer:  Merlyn Lot, MD  Pre-operative Echo Findings: Severe aortic stenosis Normal left ventricular systolic function  Post-operative Echo Findings: Trace paravalvular leak Normal left ventricular systolic function   BRIEF CLINICAL NOTE AND INDICATIONS FOR SURGERY  This 77 year old gentleman has stage D, severe, symptomatic aortic stenosis with NYHA class II symptoms of exertional fatigue and shortness of breath consistent with chronic diastolic congestive heart failure.  He has had occasional episodes of lightheadedness with standing.  I have personally reviewed his 2D echo, cardiac catheterization, and CTA studies.  His echo shows a severely calcified and thickened aortic valve with restricted leaflet mobility.  The mean gradient is 41 mmHg with a valve area by VTI of 0.79 cm consistent with severe aortic stenosis.  His left ventricular ejection fraction is mildly decreased to 45 to 50% compared to his echo in January 2024 when it was 70%.  Cardiac catheterization shows mild nonobstructive coronary disease.  I agree that aortic valve replacement is the best treatment for relief of his symptoms and to prevent further left ventricular dysfunction.  Given his age and comorbidities I think that transcatheter aortic valve replacement would be the best treatment option for him.  His gated cardiac CTA shows anatomy suitable for TAVR using a 29 mm SAPIEN 3 valve.  His abdominal and pelvic CTA shows  extensive aortoiliac calcific disease but I think there is adequate transfemoral access on the right side.  His aortogram during catheterization showed fairly large iliac vessels with no significant stenosis.  His left subclavian artery also appears suitable for access although there is some calcified plaque at the ostium without significant narrowing.  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   The patient and his wife were counseled at length regarding treatment alternatives for management of severe symptomatic aortic stenosis. The risks and benefits of surgical intervention has been discussed in detail. Long-term prognosis with medical therapy was discussed. Alternative approaches such as conventional surgical aortic valve replacement, transcatheter aortic valve replacement, and palliative medical therapy were compared and contrasted at length. This discussion was placed in the context of the patient's own specific clinical presentation and past medical history. All of their questions have been addressed.    Following the decision to proceed with transcatheter aortic valve replacement, a discussion was held regarding what types of management strategies would be attempted intraoperatively in the event of life-threatening complications, including whether or not the patient would be considered a candidate for the use of cardiopulmonary bypass and/or conversion to open sternotomy for attempted surgical intervention.  I think he would be a candidate for emergent sternotomy to manage limited intraoperative complications such as ventricular perforation but I do not think he would be a candidate for aortic replacement given the degree of calcification of his aortic arch and great vessels. The patient has been advised of a variety of complications that  might develop  including but not limited to risks of death, stroke, paravalvular leak, aortic dissection or other major vascular complications, aortic annulus rupture, device embolization, cardiac rupture or perforation, mitral regurgitation, acute myocardial infarction, arrhythmia, heart block or bradycardia requiring permanent pacemaker placement, congestive heart failure, respiratory failure, renal failure, pneumonia, infection, other late complications related to structural valve deterioration or migration, or other complications that might ultimately cause a temporary or permanent loss of functional independence or other long term morbidity. The patient provides full informed consent for the procedure as described and all questions were answered.     DETAILS OF THE OPERATIVE PROCEDURE  PREPARATION:    The patient was brought to the operating room on the above mentioned date and appropriate monitoring was established by the anesthesia team. The patient was placed in the supine position on the operating table.  Intravenous antibiotics were administered. The patient was monitored closely throughout the procedure under conscious sedation.  Baseline transthoracic echocardiogram was performed. The patient's abdomen and both groins were prepped and draped in a sterile manner. A time out procedure was performed.   PERIPHERAL ACCESS:     Using the modified Seldinger technique, femoral arterial and venous access was obtained with placement of 6 Fr sheaths on the left side.  A pigtail diagnostic catheter was passed through the left arterial sheath under fluoroscopic guidance into the aortic root.  A temporary transvenous pacemaker catheter was passed through the left femoral venous sheath under fluoroscopic guidance into the right ventricle.  The pacemaker was tested to ensure stable lead placement and pacemaker capture. Aortic root angiography was performed in order to determine the optimal angiographic angle for valve  deployment.   TRANSFEMORAL ACCESS:   Percutaneous transfemoral access and sheath placement was performed using ultrasound guidance.  The right common femoral artery was cannulated using a micropuncture needle and appropriate location was verified using hand injection angiogram.  A pair of Abbott Perclose percutaneous closure devices were placed and a 6 French sheath replaced into the femoral artery.  The patient was heparinized systemically and ACT verified > 250 seconds.    A 16 Fr transfemoral E-sheath was introduced into the right common femoral artery after progressively dilating over an Amplatz superstiff wire. An AL-2 catheter was used to direct a straight-tip exchange length wire across the native aortic valve into the left ventricle. As soon as the wire crossed the valve he developed complete heart block with no ventricular rhythm. The patient was paced using the temporary pacer at 80. This was exchanged out for a pigtail catheter and position was confirmed in the LV apex. Simultaneous LV and Ao pressures were recorded.  The pigtail catheter was exchanged for a Safari wire in the LV apex.   BALLOON AORTIC VALVULOPLASTY:   Not    TRANSCATHETER HEART VALVE DEPLOYMENT:   An Sydnor Sapien 3 Ultra transcatheter heart valve (size 29 mm) was prepared and crimped per manufacturer's guidelines, and the proper orientation of the valve is confirmed on the [employer] delivery system. The valve was advanced through the introducer sheath using normal technique until in an appropriate position in the abdominal aorta beyond the sheath tip. The balloon was then retracted and using the fine-tuning wheel was centered on the valve. The valve was then advanced across the aortic arch using appropriate flexion of the catheter. The valve was carefully positioned across the aortic valve annulus. The Commander catheter was retracted using normal technique. Once final position of the valve has been confirmed  by  angiographic assessment, the valve is deployed during rapid ventricular pacing to maintain systolic blood pressure < 50 mmHg and pulse pressure < 10 mmHg. The balloon inflation is held for >3 seconds after reaching full deployment volume. Once the balloon has fully deflated the balloon is retracted into the ascending aorta and valve function is assessed using echocardiography. There is felt to be trace paravalvular leak and no central aortic insufficiency.  The patient's hemodynamic recovery following valve deployment is good.  The deployment balloon and guidewire are both removed.    PROCEDURE COMPLETION:   He remained pacer dependent with CHB. Therefore a 6 Fr sheath was placed in the right internal jugular vein using US guidance. A temporary pacer was inserted and advanced to the RV apex. Pacing threshold was good.   The femoral sheath was removed and femoral artery closure performed.  Protamine was administered once femoral arterial repair was complete. The temporary pacemaker, pigtail catheter and femoral sheaths were removed with manual pressure used for venous hemostasis.  A Mynx femoral closure device was utilized following removal of the diagnostic sheath in the left femoral artery.  The patient tolerated the procedure well and is transported to the cath lab recovery area in stable condition. There were no immediate intraoperative complications. All sponge,  instrument and needle counts were verified correct at completion of the operation.    No blood products were administered during the operation.  The patient received a total of 30 mL of intravenous contrast during the procedure.   [name], MD 07/10/2023

## 2023-07-10 NOTE — Progress Notes (Addendum)
  [facility]   MULTIDISCIPLINARY HEART VALVE TEAM  Patient doing well s/p TAVR. He is hemodynamically stable. Groin sites stable. ECG with paced rhythm. I turned down his pacer and he remains in CHB. EP consult. NPO after midnight in case he needs a PPM. Given morphine for back pain.    Cline Crock PA-C  [facility]  Pager [phone]  Pt now with underlying sinus rhythm with RBBB. Pacemaker turned down to 40 bpm. If he becomes pacemaker dependent again RN will turn pacemaker rate back up to 70 bpm. Groin sites clear, pt otherwise stable. Spoke with wife at bedside.   Tonny Bollman 07/10/2023 5:46 PM

## 2023-07-10 NOTE — Interval H&P Note (Signed)
History and Physical Interval Note:  07/10/2023 10:13 AM  Brian Hunt  has presented today for surgery, with the diagnosis of Severe Aortic Stenosis.  The various methods of treatment have been discussed with the patient and family. After consideration of risks, benefits and other options for treatment, the patient has consented to  Procedure(s): Transcatheter Aortic Valve Replacement, Transfemoral (Right) Possible Transcatheter Aortic Valve Replacement-Left Subclavian (Left) INTRAOPERATIVE TRANSTHORACIC ECHOCARDIOGRAM (N/A) as a surgical intervention.  The patient's history has been reviewed, patient examined, no change in status, stable for surgery.  I have reviewed the patient's chart and labs.  Questions were answered to the patient's satisfaction.     [name]

## 2023-07-10 NOTE — Consult Note (Incomplete)
ELECTROPHYSIOLOGY CONSULT NOTE   *** THIS IS AN INCOMPLETE NOTE FOR A FUTURE CONSULT, PLEASE DO NOT REFERENCE FOR PLAN OF CARE ***   Patient ID: Brian Hunt MRN: [id number], DOB/AGE: 12/03/1945 77 y.o.  Admit date: 07/10/2023 Date of Consult: 07/10/2023  Primary Physician: Etta Grandchild, MD Primary Cardiologist: None  Electrophysiologist: New   Referring Provider: Dr. Excell Seltzer  Patient Profile: Brian Hunt is a 77 y.o. male with a history of severe AS, moderate carotid disease, h/o CVA, HLD, HTN, PVD, and OSA on CPAP who is being seen today for the evaluation of post op CHB at the request of Dr. Excell Seltzer.  HPI:  Patient had last seen Dr. Duke Salvia in January 2023. At that point in time the patient was doing well. He had been followed for moderate aortic stenosis for some time. Echocardiogram in January of last year demonstrated worsening aortic stenosis in the moderate to severe range. A repeat echocardiogram in September that I personally reviewed demonstrated a mildly reduced ejection fraction of 45 to 50% with concentric left ventricular hypertrophy and now mean gradient of 41 mmHg. Patient underwent coronary angiography and right heart catheterization recently which demonstrated mild nonobstructive disease, preserved cardiac output and index, with a wedge pressure of 16 mmHg and a right atrial pressure of 8 mmHg   Pt seen by TAVR team and ultimately presented 10/22 for planned procedure. Course complicated by intra and post-operative CHB. EP asked to see and evaluate for pacing.     {He/she (caps):30048} denies chest pain, palpitations, dyspnea, PND, orthopnea, nausea, vomiting, dizziness, syncope, edema, weight gain, or early satiety.   Labs Potassium4.1 (10/22 1251)   Creatinine, ser  0.70 (10/22 1251) PLT  275 (10/21 0838) HGB  12.9* (10/22 1251) WBC 8.1 (10/21 0838)  .    Past Medical History:  Diagnosis Date   Anemia    Anxiety    Aortic stenosis, severe  03/09/2018              Reading Physician    Reading Date    Result Priority      Quintella Reichert, MD [phone]    07/06/2021    Routine     Narrative & Impression       The study is normal. The study is low risk.    RBBB with repolarization changes was present at baseline with no ST changes from baseline during infusion.  Occsaional PVCs were noted.    LV perfusion is normal. There is no evidence of is   Arthritis    Basal cell carcinoma    Basal Cell X2 , Dr Mayford Knife   BPH (benign prostatic hyperplasia)    Bursitis of left hip    Dr. 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  Admit date: 07/10/2023 Date of Consult: 07/10/2023  Primary Physician: Etta Grandchild, MD Primary Cardiologist: None  Electrophysiologist: New   Referring Provider: Dr. Excell Seltzer  Patient Profile: Brian Hunt is a 77 y.o. male with a history of severe AS, moderate carotid disease, h/o CVA, HLD, HTN, PVD, and OSA on CPAP who is being seen today for the evaluation of post op CHB at the request of Dr. Excell Seltzer.  HPI:  Patient had last seen Dr. Duke Salvia in January 2023. At that point in time the patient was doing well. He had been followed for moderate aortic stenosis for some time. Echocardiogram in January of last year demonstrated worsening aortic stenosis in the moderate to severe range. A repeat echocardiogram in September that I personally reviewed demonstrated a mildly reduced ejection fraction of 45 to 50% with concentric left ventricular hypertrophy and now mean gradient of 41 mmHg. Patient underwent coronary angiography and right heart catheterization recently which demonstrated mild nonobstructive disease, preserved cardiac output and index, with a wedge pressure of 16 mmHg and a right atrial pressure of 8 mmHg   Pt seen by TAVR team and ultimately presented 10/22 for planned procedure. Course complicated by intra and post-operative CHB. EP asked to see and evaluate for pacing.     {He/she (caps):30048} denies chest pain, palpitations, dyspnea, PND, orthopnea, nausea, vomiting, dizziness, syncope, edema, weight gain, or early satiety.   Labs Potassium4.1 (10/22 1251)   Creatinine, ser  0.70 (10/22 1251) PLT  275 (10/21 0838) HGB  12.9* (10/22 1251) WBC 8.1 (10/21 0838)  .    Past Medical History:  Diagnosis Date   Anemia    Anxiety    Aortic stenosis, severe  03/09/2018              Reading Physician    Reading Date    Result Priority      Quintella Reichert, MD [phone]    07/06/2021    Routine     Narrative & Impression       The study is normal. The study is low risk.    RBBB with repolarization changes was present at baseline with no ST changes from baseline during infusion.  Occsaional PVCs were noted.    LV perfusion is normal. There is no evidence of is   Arthritis    Basal cell carcinoma    Basal Cell X2 , Dr Mayford Knife   BPH (benign prostatic hyperplasia)    Bursitis of left hip    Dr. 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   Heart murmur    mild AI/AS 06/30/21 echo   HFrEF (heart failure with reduced ejection fraction) (HCC) 06/08/2023   Hyperlipidemia    Hypertension    Internal hemorrhoids    Leg pain 06/15/2021   OSA on CPAP    PAD (peripheral artery disease) (HCC) 10/17/2021   Prostatitis    Dr  Hypertension Mother    Hyperlipidemia Mother    Transient ischemic attack Mother    Heart disease Mother        After age 42   Stroke Mother    COPD Father    Bone cancer Maternal Grandmother    Heart attack Paternal Grandmother 68   Colon cancer Son 34   Turner syndrome Daughter    Asthma Neg Hx    Diabetes Neg Hx    Pancreatic cancer Neg Hx    Esophageal cancer Neg Hx    Stomach cancer Neg Hx    Liver disease Neg Hx    Rectal cancer Neg Hx      Physical Exam: Vitals:   07/10/23 1208 07/10/23 1213 07/10/23 1218 07/10/23 1232  BP:    121/61  Pulse: 69 67 63 60  Resp: 17 (!) 22 19 18   Temp:    98.2 F (36.8 C)  TempSrc:    Temporal  SpO2:      Weight:      Height:        GEN- NAD, A&O x 3, normal affect HEENT: Normocephalic, atraumatic Lungs- CTAB, Normal effort.  Heart- Regular rate and rhythm, No M/G/R.  GI- Soft, NT, ND.  Extremities- No clubbing, cyanosis, or edema   Radiology/Studies: Structural Heart Procedure  Result Date: 07/10/2023 See surgical note for result.  CARDIAC CATHETERIZATION  Result Date: 07/10/2023 See surgical note for result.  ECHOCARDIOGRAM LIMITED  Result Date: 07/10/2023    ECHOCARDIOGRAM LIMITED REPORT   Patient Name:   Brian Hunt Date of Exam: 07/10/2023 Medical Rec #:  [id number]      Height:       67.0 in Accession #:    [id number]     Weight:       160.0 lb Date of Birth:  1946/03/05      BSA:          1.839 m Patient Age:    77 years       BP:           166/71 mmHg Patient Gender: M               HR:           51 bpm. Exam Location:  Inpatient Procedure: Limited Echo, Color Doppler, Cardiac Doppler and Echo Assisted            Procedure Indications:     TAVR implantation  History:         Patient has prior history of Echocardiogram examinations, most                  recent 05/31/2023. CHF, CKD, COPD and PAD, s/p #29 Sapien 3                  Ultra Resilia placed on 07/10/23; Risk Factors:Dyslipidemia.  Sonographer:     Milbert Coulter Referring Phys:  [id number] Tyrin Herbers COOPER Diagnosing Phys: Riley Lam MD IMPRESSIONS  1. Prior to procedure, moderate to severe aortic stenosis- mean gradient 26 mm Hg, peak gradient 45 mm Hg, with AVA 1.27 cm2 but a DVI of 0.29     After procedure a 29 mm Sapien Valve was placed. Clip 21 reflects brief loss of pacing capture. Paravalular leak was noted against the interventricular septum, trace to at most mild at the 10 o'clock position on short axis and X plane assessment. Mean gradient 4 mm Hg, Peak gradient 8 mm HG with normal DVI and  ELECTROPHYSIOLOGY CONSULT NOTE   *** THIS IS AN INCOMPLETE NOTE FOR A FUTURE CONSULT, PLEASE DO NOT REFERENCE FOR PLAN OF CARE ***   Patient ID: Brian Hunt MRN: [id number], DOB/AGE: 12/03/1945 77 y.o.  Admit date: 07/10/2023 Date of Consult: 07/10/2023  Primary Physician: Etta Grandchild, MD Primary Cardiologist: None  Electrophysiologist: New   Referring Provider: Dr. Excell Seltzer  Patient Profile: Brian Hunt is a 77 y.o. male with a history of severe AS, moderate carotid disease, h/o CVA, HLD, HTN, PVD, and OSA on CPAP who is being seen today for the evaluation of post op CHB at the request of Dr. Excell Seltzer.  HPI:  Patient had last seen Dr. Duke Salvia in January 2023. At that point in time the patient was doing well. He had been followed for moderate aortic stenosis for some time. Echocardiogram in January of last year demonstrated worsening aortic stenosis in the moderate to severe range. A repeat echocardiogram in September that I personally reviewed demonstrated a mildly reduced ejection fraction of 45 to 50% with concentric left ventricular hypertrophy and now mean gradient of 41 mmHg. Patient underwent coronary angiography and right heart catheterization recently which demonstrated mild nonobstructive disease, preserved cardiac output and index, with a wedge pressure of 16 mmHg and a right atrial pressure of 8 mmHg   Pt seen by TAVR team and ultimately presented 10/22 for planned procedure. Course complicated by intra and post-operative CHB. EP asked to see and evaluate for pacing.     {He/she (caps):30048} denies chest pain, palpitations, dyspnea, PND, orthopnea, nausea, vomiting, dizziness, syncope, edema, weight gain, or early satiety.   Labs Potassium4.1 (10/22 1251)   Creatinine, ser  0.70 (10/22 1251) PLT  275 (10/21 0838) HGB  12.9* (10/22 1251) WBC 8.1 (10/21 0838)  .    Past Medical History:  Diagnosis Date   Anemia    Anxiety    Aortic stenosis, severe  03/09/2018              Reading Physician    Reading Date    Result Priority      Quintella Reichert, MD [phone]    07/06/2021    Routine     Narrative & Impression       The study is normal. The study is low risk.    RBBB with repolarization changes was present at baseline with no ST changes from baseline during infusion.  Occsaional PVCs were noted.    LV perfusion is normal. There is no evidence of is   Arthritis    Basal cell carcinoma    Basal Cell X2 , Dr Mayford Knife   BPH (benign prostatic hyperplasia)    Bursitis of left hip    Dr. 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  Admit date: 07/10/2023 Date of Consult: 07/10/2023  Primary Physician: Etta Grandchild, MD Primary Cardiologist: None  Electrophysiologist: New   Referring Provider: Dr. Excell Seltzer  Patient Profile: Brian Hunt is a 77 y.o. male with a history of severe AS, moderate carotid disease, h/o CVA, HLD, HTN, PVD, and OSA on CPAP who is being seen today for the evaluation of post op CHB at the request of Dr. Excell Seltzer.  HPI:  Patient had last seen Dr. Duke Salvia in January 2023. At that point in time the patient was doing well. He had been followed for moderate aortic stenosis for some time. Echocardiogram in January of last year demonstrated worsening aortic stenosis in the moderate to severe range. A repeat echocardiogram in September that I personally reviewed demonstrated a mildly reduced ejection fraction of 45 to 50% with concentric left ventricular hypertrophy and now mean gradient of 41 mmHg. Patient underwent coronary angiography and right heart catheterization recently which demonstrated mild nonobstructive disease, preserved cardiac output and index, with a wedge pressure of 16 mmHg and a right atrial pressure of 8 mmHg   Pt seen by TAVR team and ultimately presented 10/22 for planned procedure. Course complicated by intra and post-operative CHB. EP asked to see and evaluate for pacing.     {He/she (caps):30048} denies chest pain, palpitations, dyspnea, PND, orthopnea, nausea, vomiting, dizziness, syncope, edema, weight gain, or early satiety.   Labs Potassium4.1 (10/22 1251)   Creatinine, ser  0.70 (10/22 1251) PLT  275 (10/21 0838) HGB  12.9* (10/22 1251) WBC 8.1 (10/21 0838)  .    Past Medical History:  Diagnosis Date   Anemia    Anxiety    Aortic stenosis, severe  03/09/2018              Reading Physician    Reading Date    Result Priority      Quintella Reichert, MD [phone]    07/06/2021    Routine     Narrative & Impression       The study is normal. The study is low risk.    RBBB with repolarization changes was present at baseline with no ST changes from baseline during infusion.  Occsaional PVCs were noted.    LV perfusion is normal. There is no evidence of is   Arthritis    Basal cell carcinoma    Basal Cell X2 , Dr Mayford Knife   BPH (benign prostatic hyperplasia)    Bursitis of left hip    Dr. 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   Heart murmur    mild AI/AS 06/30/21 echo   HFrEF (heart failure with reduced ejection fraction) (HCC) 06/08/2023   Hyperlipidemia    Hypertension    Internal hemorrhoids    Leg pain 06/15/2021   OSA on CPAP    PAD (peripheral artery disease) (HCC) 10/17/2021   Prostatitis    Dr  Hypertension Mother    Hyperlipidemia Mother    Transient ischemic attack Mother    Heart disease Mother        After age 42   Stroke Mother    COPD Father    Bone cancer Maternal Grandmother    Heart attack Paternal Grandmother 68   Colon cancer Son 34   Turner syndrome Daughter    Asthma Neg Hx    Diabetes Neg Hx    Pancreatic cancer Neg Hx    Esophageal cancer Neg Hx    Stomach cancer Neg Hx    Liver disease Neg Hx    Rectal cancer Neg Hx      Physical Exam: Vitals:   07/10/23 1208 07/10/23 1213 07/10/23 1218 07/10/23 1232  BP:    121/61  Pulse: 69 67 63 60  Resp: 17 (!) 22 19 18   Temp:    98.2 F (36.8 C)  TempSrc:    Temporal  SpO2:      Weight:      Height:        GEN- NAD, A&O x 3, normal affect HEENT: Normocephalic, atraumatic Lungs- CTAB, Normal effort.  Heart- Regular rate and rhythm, No M/G/R.  GI- Soft, NT, ND.  Extremities- No clubbing, cyanosis, or edema   Radiology/Studies: Structural Heart Procedure  Result Date: 07/10/2023 See surgical note for result.  CARDIAC CATHETERIZATION  Result Date: 07/10/2023 See surgical note for result.  ECHOCARDIOGRAM LIMITED  Result Date: 07/10/2023    ECHOCARDIOGRAM LIMITED REPORT   Patient Name:   Brian Hunt Date of Exam: 07/10/2023 Medical Rec #:  [id number]      Height:       67.0 in Accession #:    [id number]     Weight:       160.0 lb Date of Birth:  1946/03/05      BSA:          1.839 m Patient Age:    77 years       BP:           166/71 mmHg Patient Gender: M               HR:           51 bpm. Exam Location:  Inpatient Procedure: Limited Echo, Color Doppler, Cardiac Doppler and Echo Assisted            Procedure Indications:     TAVR implantation  History:         Patient has prior history of Echocardiogram examinations, most                  recent 05/31/2023. CHF, CKD, COPD and PAD, s/p #29 Sapien 3                  Ultra Resilia placed on 07/10/23; Risk Factors:Dyslipidemia.  Sonographer:     Milbert Coulter Referring Phys:  [id number] Tyrin Herbers COOPER Diagnosing Phys: Riley Lam MD IMPRESSIONS  1. Prior to procedure, moderate to severe aortic stenosis- mean gradient 26 mm Hg, peak gradient 45 mm Hg, with AVA 1.27 cm2 but a DVI of 0.29     After procedure a 29 mm Sapien Valve was placed. Clip 21 reflects brief loss of pacing capture. Paravalular leak was noted against the interventricular septum, trace to at most mild at the 10 o'clock position on short axis and X plane assessment. Mean gradient 4 mm Hg, Peak gradient 8 mm HG with normal DVI and  Hypertension Mother    Hyperlipidemia Mother    Transient ischemic attack Mother    Heart disease Mother        After age 42   Stroke Mother    COPD Father    Bone cancer Maternal Grandmother    Heart attack Paternal Grandmother 68   Colon cancer Son 34   Turner syndrome Daughter    Asthma Neg Hx    Diabetes Neg Hx    Pancreatic cancer Neg Hx    Esophageal cancer Neg Hx    Stomach cancer Neg Hx    Liver disease Neg Hx    Rectal cancer Neg Hx      Physical Exam: Vitals:   07/10/23 1208 07/10/23 1213 07/10/23 1218 07/10/23 1232  BP:    121/61  Pulse: 69 67 63 60  Resp: 17 (!) 22 19 18   Temp:    98.2 F (36.8 C)  TempSrc:    Temporal  SpO2:      Weight:      Height:        GEN- NAD, A&O x 3, normal affect HEENT: Normocephalic, atraumatic Lungs- CTAB, Normal effort.  Heart- Regular rate and rhythm, No M/G/R.  GI- Soft, NT, ND.  Extremities- No clubbing, cyanosis, or edema   Radiology/Studies: Structural Heart Procedure  Result Date: 07/10/2023 See surgical note for result.  CARDIAC CATHETERIZATION  Result Date: 07/10/2023 See surgical note for result.  ECHOCARDIOGRAM LIMITED  Result Date: 07/10/2023    ECHOCARDIOGRAM LIMITED REPORT   Patient Name:   Brian Hunt Date of Exam: 07/10/2023 Medical Rec #:  [id number]      Height:       67.0 in Accession #:    [id number]     Weight:       160.0 lb Date of Birth:  1946/03/05      BSA:          1.839 m Patient Age:    77 years       BP:           166/71 mmHg Patient Gender: M               HR:           51 bpm. Exam Location:  Inpatient Procedure: Limited Echo, Color Doppler, Cardiac Doppler and Echo Assisted            Procedure Indications:     TAVR implantation  History:         Patient has prior history of Echocardiogram examinations, most                  recent 05/31/2023. CHF, CKD, COPD and PAD, s/p #29 Sapien 3                  Ultra Resilia placed on 07/10/23; Risk Factors:Dyslipidemia.  Sonographer:     Milbert Coulter Referring Phys:  [id number] Tyrin Herbers COOPER Diagnosing Phys: Riley Lam MD IMPRESSIONS  1. Prior to procedure, moderate to severe aortic stenosis- mean gradient 26 mm Hg, peak gradient 45 mm Hg, with AVA 1.27 cm2 but a DVI of 0.29     After procedure a 29 mm Sapien Valve was placed. Clip 21 reflects brief loss of pacing capture. Paravalular leak was noted against the interventricular septum, trace to at most mild at the 10 o'clock position on short axis and X plane assessment. Mean gradient 4 mm Hg, Peak gradient 8 mm HG with normal DVI and

## 2023-07-10 NOTE — Anesthesia Preprocedure Evaluation (Signed)
Anesthesia Evaluation  Patient identified by MRN, date of birth, ID band Patient awake    Reviewed: Allergy & Precautions, NPO status , Patient's Chart, lab work & pertinent test results  History of Anesthesia Complications Negative for: history of anesthetic complications  Airway Mallampati: III  TM Distance: >3 FB Neck ROM: Full    Dental  (+) Teeth Intact, Dental Advisory Given   Pulmonary shortness of breath, sleep apnea and Continuous Positive Airway Pressure Ventilation , COPD,  COPD inhaler, former smoker   breath sounds clear to auscultation       Cardiovascular hypertension, Pt. on medications and Pt. on home beta blockers + Peripheral Vascular Disease and +CHF  + Valvular Problems/Murmurs AS  Rhythm:Regular + Systolic murmurs  1. Left ventricular ejection fraction, by estimation, is 45 to 50%. Left  ventricular ejection fraction by 3D volume is 48 %. The left ventricle has  mildly decreased function. The left ventricle demonstrates global  hypokinesis. There is mild concentric  left ventricular hypertrophy. Left ventricular diastolic function could  not be evaluated.   2. Right ventricular systolic function is normal. The right ventricular  size is normal.   3. Left atrial size was moderately dilated.   4. Right atrial size was severely dilated.   5. The mitral valve is degenerative. Trivial mitral valve regurgitation.  No evidence of mitral stenosis. The mean mitral valve gradient is 4.0  mmHg. Moderate to severe mitral annular calcification.   6. The aortic valve is calcified. There is moderate calcification of the  aortic valve. There is severe thickening of the aortic valve. Aortic valve  regurgitation is mild. Severe aortic valve stenosis. Aortic valve area, by  VTI measures 0.79 cm. Aortic  valve mean gradient measures 41.0 mmHg. Aortic valve Vmax measures 3.90  m/s.   7. The inferior vena cava is normal in  size with greater than 50%  respiratory variability, suggesting right atrial pressure of 3 mmHg.      Neuro/Psych  PSYCHIATRIC DISORDERS Anxiety     negative neurological ROS     GI/Hepatic negative GI ROS, Neg liver ROS,,,  Endo/Other  negative endocrine ROS    Renal/GU CRFRenal diseaseLab Results      Component                Value               Date                      NA                       132 (L)             07/09/2023                K                        4.0                 07/09/2023                CO2                      25                  07/09/2023                GLUCOSE  103 (H)             07/09/2023                BUN                      11                  07/09/2023                CREATININE               0.81                07/09/2023                CALCIUM                  9.2                 07/09/2023                GFR                      83.64               04/17/2023                EGFR                     89                  06/08/2023                GFRNONAA                 >60                 07/09/2023                Musculoskeletal  (+) Arthritis ,    Abdominal   Peds  Hematology Lab Results      Component                Value               Date                      WBC                      8.1                 07/09/2023                HGB                      13.0                07/09/2023                HCT                      38.8 (L)            07/09/2023                MCV                      97.7  07/09/2023                PLT                      275                 07/09/2023              Anesthesia Other Findings   Reproductive/Obstetrics                              Anesthesia Physical Anesthesia Plan  ASA: 4  Anesthesia Plan: MAC   Post-op Pain Management: Minimal or no pain anticipated   Induction: Intravenous  PONV Risk Score and Plan: 1 and Propofol  infusion and Treatment may vary due to age or medical condition  Airway Management Planned: Nasal Cannula, Natural Airway and Simple Face Mask  Additional Equipment: None  Intra-op Plan:   Post-operative Plan:   Informed Consent: I have reviewed the patients History and Physical, chart, labs and discussed the procedure including the risks, benefits and alternatives for the proposed anesthesia with the patient or authorized representative who has indicated his/her understanding and acceptance.     Dental advisory given  Plan Discussed with: CRNA  Anesthesia Plan Comments:          Anesthesia Quick Evaluation

## 2023-07-10 NOTE — Op Note (Signed)
HEART AND VASCULAR CENTER   MULTIDISCIPLINARY HEART VALVE TEAM   TAVR OPERATIVE NOTE   Date of Procedure:  07/10/2023  Preoperative Diagnosis: Severe Aortic Stenosis   Postoperative Diagnosis: Same   Procedure:   Transcatheter Aortic Valve Replacement - Percutaneous Transfemoral Approach  Quinn Sapien 3 Ultra Resilia THV (size 29 mm, serial # [id number])   Co-Surgeons:  Evelene Croon, MD and Tonny Bollman, MD  Anesthesiologist:  Corky Sox, MD  Echocardiographer:  Riley Lam, MD  Pre-operative Echo Findings: Severe aortic stenosis Normal left ventricular systolic function  Post-operative Echo Findings: Trace paravalvular leak Normal/unchanged left ventricular systolic function  BRIEF CLINICAL NOTE AND INDICATIONS FOR SURGERY  77 year old male with severe, stage D1 aortic stenosis limited by NYHA functional class II symptoms of exertional dyspnea and chronic diastolic heart failure.  After he underwent multidisciplinary heart team review of extensive preoperative imaging studies, he is referred for transfemoral TAVR.  During the course of the patient's preoperative work up they have been evaluated comprehensively by a multidisciplinary team of specialists coordinated through the Multidisciplinary Heart Valve Clinic in the [facility].  They have been demonstrated to suffer from symptomatic severe aortic stenosis as noted above. The patient has been counseled extensively as to the relative risks and benefits of all options for the treatment of severe aortic stenosis including long term medical therapy, conventional surgery for aortic valve replacement, and transcatheter aortic valve replacement.  The patient has been independently evaluated in formal cardiac surgical consultation by Dr Laneta Simmers, who deemed the patient appropriate for TAVR. Based upon review of all of the patient's preoperative diagnostic tests they are felt to be candidate for  transcatheter aortic valve replacement using the transfemoral approach as an alternative to conventional surgery.    Following the decision to proceed with transcatheter aortic valve replacement, a discussion has been held regarding what types of management strategies would be attempted intraoperatively in the event of life-threatening complications, including whether or not the patient would be considered a candidate for the use of cardiopulmonary bypass and/or conversion to open sternotomy for attempted surgical intervention.  The patient has been advised of a variety of complications that might develop peculiar to this approach including but not limited to risks of death, stroke, paravalvular leak, aortic dissection or other major vascular complications, aortic annulus rupture, device embolization, cardiac rupture or perforation, acute myocardial infarction, arrhythmia, heart block or bradycardia requiring permanent pacemaker placement, congestive heart failure, respiratory failure, renal failure, pneumonia, infection, other late complications related to structural valve deterioration or migration, or other complications that might ultimately cause a temporary or permanent loss of functional independence or other long term morbidity.  The patient provides full informed consent for the procedure as described and all questions were answered preoperatively.  DETAILS OF THE OPERATIVE PROCEDURE  PREPARATION:   The patient is brought to the operating room on the above mentioned date and central monitoring was established by the anesthesia team including placement of a radial arterial line. The patient is placed in the supine position on the operating table.  Intravenous antibiotics are administered. The patient is monitored closely throughout the procedure under conscious sedation.  Baseline transthoracic echocardiogram is performed. The patient's chest, abdomen, both groins, and both lower extremities are  prepared and draped in a sterile manner. A time out procedure is performed.   PERIPHERAL ACCESS:   Using ultrasound guidance, femoral arterial and venous access is obtained with placement of 6 Fr sheaths on the left side.  [place] images are digitally captured and stored in the patient's chart. A pigtail diagnostic catheter was passed through the femoral arterial sheath under fluoroscopic guidance into the aortic root.  A temporary transvenous pacemaker catheter was passed through the femoral venous sheath under fluoroscopic guidance into the right ventricle.  The pacemaker was tested to ensure stable lead placement and pacemaker capture. Aortic root angiography was performed in order to determine the optimal angiographic angle for valve deployment.  TRANSFEMORAL ACCESS:  A micropuncture technique is used to access the right femoral artery under fluoroscopic and ultrasound guidance.  2 Perclose devices are deployed at 10' and 2' positions to 'PreClose' the femoral artery. An 8 French sheath is placed and then an Amplatz Superstiff wire is advanced through the sheath. This is changed out for a 16 French transfemoral E-Sheath after progressively dilating over the Superstiff wire.  An AL-2 catheter was used to direct a straight-tip exchange length wire across the native aortic valve into the left ventricle. This was exchanged out for a pigtail catheter and position was confirmed in the LV apex. Simultaneous LV and Ao pressures were recorded.  The pigtail catheter was exchanged for a Safari wire in the LV apex.    BALLOON AORTIC VALVULOPLASTY:  Not performed  TRANSCATHETER HEART VALVE DEPLOYMENT:  An Fuhr Sapien 3 Ultra Resilia transcatheter heart valve (size 29 mm) was prepared and crimped per manufacturer's guidelines, and the proper orientation of the valve is confirmed on the [employer] delivery system. The valve was advanced through the introducer sheath using normal technique until in an  appropriate position in the abdominal aorta beyond the sheath tip. The balloon was then retracted and using the fine-tuning wheel was centered on the valve. The valve was then advanced across the aortic arch using appropriate flexion of the catheter. The valve was carefully positioned across the aortic valve annulus. The Commander catheter was retracted using normal technique. Once final position of the valve has been confirmed by angiographic assessment, the valve is deployed while temporarily holding ventilation and during rapid ventricular pacing to maintain systolic blood pressure < 50 mmHg and pulse pressure < 10 mmHg. The balloon inflation is held for >3 seconds after reaching full deployment volume. Once the balloon has fully deflated the balloon is retracted into the ascending aorta and valve function is assessed using echocardiography. The patient's hemodynamic recovery following valve deployment is good.  However, he had complete AV block and no AV conduction post valve deployment.  He required transvenous pacing.  The deployment balloon and guidewire are both removed. Echo demostrated acceptable post-procedural gradients, stable mitral valve function, and trace aortic insufficiency.    PROCEDURE COMPLETION:  The sheath was removed and femoral artery closure is performed using the 2 previously deployed Perclose devices.  Protamine is administered once femoral arterial repair was complete. The site is clear with no evidence of bleeding or hematoma after the sutures are tightened.  Since the patient remained pacemaker dependent, we decided to change his pacing site to the right internal jugular vein.  Using ultrasound guidance, the right IJ is accessed and a 6 French sheath is inserted.  A straight tip pacing wire is advanced into the RV apex without difficulty and an acceptable pacing threshold of less than 1 mA is obtained.  The catheter is secured in place.  The femoral venous temporary pacemaker and  pigtail catheters are removed. Mynx closure is used for contralateral femoral arterial hemostasis for the 6 Fr sheath.  The patient tolerated  the procedure well and is transported to the recovery area in stable condition. There were no immediate intraoperative complications. All sponge instrument and needle counts are verified correct at completion of the operation.   The patient received a total of 30 mL of intravenous contrast during the procedure.  EBL: minimal    Tonny Bollman, MD 07/10/2023 2:34 PM

## 2023-07-10 NOTE — Transfer of Care (Signed)
Immediate Anesthesia Transfer of Care Note  Patient: Brian Hunt  Procedure(s) Performed: Transcatheter Aortic Valve Replacement, Transfemoral (Right) Possible Transcatheter Aortic Valve Replacement-Left Subclavian (Left) INTRAOPERATIVE TRANSTHORACIC ECHOCARDIOGRAM TEMPORARY PACEMAKER (Right)  Patient Location: PACU and Cath Lab  Anesthesia Type:MAC  Level of Consciousness: awake, drowsy, patient cooperative, and responds to stimulation  Airway & Oxygen Therapy: Patient Spontanous Breathing and Patient connected to face mask oxygen  Post-op Assessment: Report given to RN and Post -op Vital signs reviewed and stable  Post vital signs: Reviewed and stable  Last Vitals:  Vitals Value Taken Time  BP 130/61 07/10/23 1225  Temp    Pulse 60 07/10/23 1230  Resp 14 07/10/23 1230  SpO2 100 % 07/10/23 1230  Vitals shown include unfiled device data.  Last Pain:  Vitals:   07/10/23 0751  TempSrc:   PainSc: 0-No pain      Patients Stated Pain Goal: 0 (07/10/23 0751)  Complications:  Encounter Notable Events  Notable Event Outcome Phase Comment  Cardiac arrest  Intraprocedure Inserted temporary venous pacemaker and left in place. Sent to ICU

## 2023-07-10 NOTE — Progress Notes (Signed)
ON-CALL CARDIOLOGY 07/10/23  Patient's name: Brian Hunt.   MRN: [id number].    DOB: 01-21-1946 Primary care provider: Etta Grandchild, MD.  Interaction regarding this patient's care today: RN called stating the patient is having difficulty urinating.  Interventions already performed: Pure wick and also using the urinal in the sitting and standing positions.  Known history of BPH.  Recommendations: Recommended bladder scan -which is greater than 500 cc of urine. Straight cath x 1 recommended. Avoid Foley given the recent TAVR implant.  No charge.   Tessa Lerner, DO, [facility]  [facility]  [address] [place], [place] [address] 07/10/2023 8:30 PM

## 2023-07-10 NOTE — Discharge Summary (Incomplete)
HEART AND VASCULAR CENTER   MULTIDISCIPLINARY HEART VALVE TEAM  Discharge Summary    Patient ID: AAYAM TISCARENO MRN: [id number]; DOB: Jul 29, 1946  Admit date: 07/10/2023 Discharge date: 07/12/2023  Primary Care Provider: Etta Grandchild, MD  Primary Cardiologist: Chilton Si, MD / Dr. Excell Seltzer & Dr. Laneta Simmers (TAVR)  Discharge Diagnoses    Principal Problem:   S/P TAVR (transcatheter aortic valve replacement) Active Problems:   Hyperlipidemia with target LDL less than 70   BPH (benign prostatic hyperplasia)   Aortic stenosis, severe   PAD (peripheral artery disease) (HCC)   COPD (chronic obstructive pulmonary disease) (HCC)   Heart failure with mildly reduced ejection fraction (HFmrEF) (HCC)   RBBB   CHB (complete heart block) (HCC)   S/P placement of cardiac pacemaker   Allergies Allergies  Allergen Reactions   Demeclocycline Nausea Only    Nausea and constipation   Pravastatin Other (See Comments)    myopathy   Lisinopril Cough   Oxycodone     Mental confusion    Diagnostic Studies/Procedures     TAVR OPERATIVE NOTE     Date of Procedure:                07/10/2023   Preoperative Diagnosis:      Severe Aortic Stenosis    Postoperative Diagnosis:    Same    Procedure:        Transcatheter Aortic Valve Replacement - Percutaneous Transfemoral Approach             Vanwagoner Sapien 3 Ultra Resilia THV (size 29 mm, serial # [id number])              Co-Surgeons:                        Evelene Croon, MD and Tonny Bollman, MD   Anesthesiologist:                  Corky Sox, MD   Echocardiographer:              Riley Lam, MD   Pre-operative Echo Findings: Severe aortic stenosis Normal left ventricular systolic function   Post-operative Echo Findings: Trace paravalvular leak Normal/unchanged left ventricular systolic function  _____________    Echo 07/11/23: IMPRESSIONS   1. Left ventricular ejection fraction, by estimation, is 65 to 70%. The   left ventricle has hyperdynamic function. The left ventricle has no  regional wall motion abnormalities. There is mild concentric left  ventricular hypertrophy. Left ventricular  diastolic parameters are consistent with Grade I diastolic dysfunction  (impaired relaxation).   2. Right ventricular systolic function is normal. The right ventricular  size is normal. There is mildly elevated pulmonary artery systolic  pressure. The estimated right ventricular systolic pressure is 42.1 mmHg.   3. Left atrial size was moderately dilated.   4. Right atrial size was mildly dilated.   5. The mitral valve is degenerative. No evidence of mitral valve  regurgitation. Mild mitral stenosis. The mean mitral valve gradient is 5.0  mmHg. Moderate mitral annular calcification.   6. Bioprosthetic aortic valve s/p TAVR. 29 mm Mealy Ultra THV. Mean  gradient 11 mmHg, EOA 3 cm^2 (VTI). No significant peri-valvular leakage  noted.   7. The inferior vena cava is dilated in size with >50% respiratory  variability, suggesting right atrial pressure of 8 mmHg.   ____________________  07/11/23 PACEMAKER IMPLANT  Conclusion  1.  Severe aortic stenosis post TAVR complicated  by complete heart block  2.  Successful dual-chamber permanent pacemaker implant with left bundle area lead and evidence of left-sided conduction system capture  3.  No early apparent complications.   History of Present Illness     Brian Hunt is a 77 y.o. male with a history of PAD with LE claudication, ongoing tobacco abuse, HTN, HLD, carotid disease, OSA on CPAP, hyponatremia, HFmrEF (EF 45-50%) and severe AS who presented to [facility] on 07/10/23 for planned TAVR.   He has been followed over time for aortic stenosis. His most recent echo on 05/31/2023 showed EF 45-50%, mild LVH, and severe AS with a mean grad 41 mmHg, AVA 0.70 cm2 and mod-severe MAC. EF 70% on previous echo in 09/2022. He reported fatigue and dyspnea on exertion with exercise  at the gym. [facility] 06/18/23 showed mild nonobstructive disease.   The patient was evaluated by the multidisciplinary valve team and felt to have severe, symptomatic aortic stenosis and to be a suitable candidate for TAVR, which was set up for 07/10/23.   Hospital Course     Consultants: EP   Severe AS: s/p successful TAVR with a 29 mm Samet Sapien 3 Ultra Resilia THV via the TF approach on 07/10/23. Post operative echo showed EF 65%, normally functioning TAVR with a mean gradient of 11 mmHg and no PVL as well as moderate MAC with with mild MS. Groin sites are stable. Resumed on home Asprin 81mg  daily. Walked with cardiac rehab with no issues. Plan for discharge home today with close follow up in the outpatient setting.   CHB: patient had a pre-existing RBBB and developed pacer dependant CHB s/p TAVR. 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 Lalla Brothers. CXR stable. Follow up with device team arranged.  HTN: BP well controlled. Resumed on home telmisartan 40mg  daily  HFmrEF: EF normalized to 65%. No s/s volume overload.   Hyponatremia: NA 130. This has been a chronic ongoing issue for him.   Anemia: mild drop in Hg. ( 13.6--> 11.1). Likely post op related.   Thyroid nodule: pre TAVR scans showed a "mixed density 2.2 cm right thyroid isthmus nodule." No recommendations from radiology. Will discuss in the outpatient setting and get a thyroid US set up.  _____________  Discharge Vitals Blood pressure (!) 107/57, pulse 95, temperature 98.7 F (37.1 C), temperature source Axillary, resp. rate 18, height 5\' 7"  (1.702 m), weight 74.9 kg, SpO2 91%.  Filed Weights   07/10/23 0734 07/11/23 0500 07/12/23 0500  Weight: 72.6 kg 74.7 kg 74.9 kg     GEN: Well nourished, well developed, in no acute distress HEENT: normal Neck: no JVD or masses Cardiac: RRR; soft flow murmur. No rubs, or gallops,no edema. Bandage over pacer with small amount of red blood.  Respiratory:  clear to auscultation  bilaterally, normal work of breathing GI: soft, nontender, nondistended, + BS MS: no deformity or atrophy Skin: warm and dry, no rash.  Groin sites clear without hematoma or ecchymosis Neuro:  Alert and Oriented x 3, Strength and sensation are intact Psych: euthymic mood, full affect  Disposition   Pt is being discharged home today in good condition.  Follow-up Plans & Appointments     Follow-up Information     Filbert Schilder, NP. Go on 07/18/2023.   Specialty: Cardiology Why: @ 9:55am, please arrive at least 10 minutes early. Contact information: [address] [place] [address] [phone]  Discharge Medications   Allergies as of 07/12/2023       Reactions   Demeclocycline Nausea Only   Nausea and constipation   Pravastatin Other (See Comments)   myopathy   Lisinopril Cough   Oxycodone    Mental confusion        Medication List     TAKE these medications    aspirin EC 81 MG tablet Take 81 mg by mouth daily.   cilostazol 50 MG tablet Commonly known as: PLETAL TAKE 1 TABLET(50 MG) BY MOUTH TWICE DAILY   famotidine 20 MG tablet Commonly known as: PEPCID Take 20 mg by mouth daily as needed for heartburn or indigestion.   hydrocortisone cream 1 % Apply 1 Application topically daily as needed for itching.   multivitamin with minerals tablet Take 1 tablet by mouth daily.   rosuvastatin 5 MG tablet Commonly known as: CRESTOR TAKE 1 TABLET EVERY DAY   Stiolto Respimat 2.5-2.5 MCG/ACT Aers Generic drug: Tiotropium Bromide-Olodaterol Inhale 1 puff into the lungs daily.   Systane Balance 0.6 % Soln Generic drug: Propylene Glycol Place 1 drop into both eyes as needed (dry eyes).   tamsulosin 0.4 MG Caps capsule Commonly known as: FLOMAX Take 0.4 mg by mouth at bedtime.   telmisartan 40 MG tablet Commonly known as: MICARDIS Take 1 tablet (40 mg total) by mouth daily.         Outstanding Labs/Studies    none  Duration of Discharge Encounter   Greater than 30 minutes including physician time.  Byrd Hesselbach, PA-C 07/12/2023, 8:59 AM [phone]  Patient seen, examined. Available data reviewed. Agree with findings, assessment, and plan as outlined by Carlean Jews, PA-C.  The patient is independently interviewed and examined.  He is alert, oriented, no distress.  Lungs are clear, heart is regular rate and rhythm with no murmur gallop, abdomen soft nontender, pacemaker site in the left subclavicular area is clear with no hematoma or swelling, lower extremities have no edema.  Patient is in sinus rhythm with right bundle branch block at present.  Appreciate care of the EP team.  Patient is medically stable for discharge today.  Otherwise as outlined above.  I am in agreement with his discharge plan.  We have discussed his follow-up and post-TAVR restrictions.  Tonny Bollman, M.D. 07/12/2023 10:30 AM

## 2023-07-11 ENCOUNTER — Inpatient Hospital Stay (HOSPITAL_COMMUNITY)
Admission: RE | Disposition: A | Discharge status: Home / Self Care | Payer: Self-pay | Source: Ambulatory Visit | Attending: Cardiovascular Disease

## 2023-07-11 ENCOUNTER — Encounter (HOSPITAL_COMMUNITY): Payer: Self-pay | Admitting: Cardiovascular Disease

## 2023-07-11 ENCOUNTER — Inpatient Hospital Stay (HOSPITAL_COMMUNITY): Payer: Medicare PPO

## 2023-07-11 DIAGNOSIS — Z952 Presence of prosthetic heart valve: Secondary | ICD-10-CM | POA: Diagnosis not present

## 2023-07-11 DIAGNOSIS — I442 Atrioventricular block, complete: Secondary | ICD-10-CM

## 2023-07-11 HISTORY — PX: PACEMAKER IMPLANT: EP1218

## 2023-07-11 LAB — BASIC METABOLIC PANEL
Anion gap: 7 (ref 5–15)
BUN: 12 mg/dL (ref 8–23)
CO2: 24 mmol/L (ref 22–32)
Calcium: 8.3 mg/dL — ABNORMAL LOW (ref 8.9–10.3)
Chloride: 98 mmol/L (ref 98–111)
Creatinine, Ser: 0.85 mg/dL (ref 0.61–1.24)
GFR, Estimated: >60 mL/min (ref >60)
Glucose, Bld: 121 mg/dL — ABNORMAL HIGH (ref 70–99)
Potassium: 4.1 mmol/L (ref 3.5–5.1)
Sodium: 129 mmol/L — ABNORMAL LOW (ref 135–145)

## 2023-07-11 LAB — CBC
HCT: 34.4 % — ABNORMAL LOW (ref 39.0–52.0)
Hemoglobin: 11.6 g/dL — ABNORMAL LOW (ref 13.0–17.0)
MCH: 32.8 pg (ref 26.0–34.0)
MCHC: 33.7 g/dL (ref 30.0–36.0)
MCV: 97.2 fL (ref 80.0–100.0)
Platelets: 193 10*3/uL (ref 150–400)
RBC: 3.54 MIL/uL — ABNORMAL LOW (ref 4.22–5.81)
RDW: 13.1 % (ref 11.5–15.5)
WBC: 7.2 10*3/uL (ref 4.0–10.5)
nRBC: 0 % (ref 0.0–0.2)

## 2023-07-11 LAB — SURGICAL PCR SCREEN
MRSA, PCR: NEGATIVE
Staphylococcus aureus: NEGATIVE

## 2023-07-11 LAB — ECHOCARDIOGRAM COMPLETE
AR max vel: 3.33 cm2
AV Area VTI: 3.09 cm2
AV Area mean vel: 3.32 cm2
AV Mean grad: 11 mm[Hg]
AV Peak grad: 21 mm[Hg]
Ao pk vel: 2.29 m/s
Area-P 1/2: 1.15 cm2
Height: 67 in
MV VTI: 2.87 cm2
S' Lateral: 2.9 cm
Weight: 2634.94 [oz_av]

## 2023-07-11 LAB — MAGNESIUM: Magnesium: 1.8 mg/dL (ref 1.7–2.4)

## 2023-07-11 SURGERY — PACEMAKER IMPLANT

## 2023-07-11 MED ORDER — CEFAZOLIN SODIUM-DEXTROSE 2-4 GM/100ML-% IV SOLN
INTRAVENOUS | Status: AC
  Filled 2023-07-11: qty 100

## 2023-07-11 MED ORDER — CHLORHEXIDINE GLUCONATE 4 % EX SOLN
60.0000 mL | Freq: Once | CUTANEOUS | Status: AC
  Administered 2023-07-11: 4 via TOPICAL

## 2023-07-11 MED ORDER — CEFAZOLIN SODIUM-DEXTROSE 2-4 GM/100ML-% IV SOLN
2.0000 g | INTRAVENOUS | Status: AC
  Administered 2023-07-11: 2 g via INTRAVENOUS
  Filled 2023-07-11: qty 100

## 2023-07-11 MED ORDER — SODIUM CHLORIDE 0.9 % IV SOLN: INTRAVENOUS | Status: DC

## 2023-07-11 MED ORDER — SODIUM CHLORIDE 0.9 % IV SOLN
80.0000 mg | INTRAVENOUS | Status: AC
  Administered 2023-07-11: 80 mg
  Filled 2023-07-11: qty 2

## 2023-07-11 MED ORDER — LIDOCAINE HCL (PF) 1 % IJ SOLN
INTRAMUSCULAR | Status: AC
  Filled 2023-07-11: qty 60

## 2023-07-11 MED ORDER — FENTANYL CITRATE (PF) 100 MCG/2ML IJ SOLN
INTRAMUSCULAR | Status: DC | PRN
  Administered 2023-07-11: 25 ug via INTRAVENOUS

## 2023-07-11 MED ORDER — MIDAZOLAM HCL 2 MG/2ML IJ SOLN
INTRAMUSCULAR | Status: AC
  Filled 2023-07-11: qty 2

## 2023-07-11 MED ORDER — CHLORHEXIDINE GLUCONATE 4 % EX SOLN
60.0000 mL | Freq: Once | CUTANEOUS | Status: AC
  Administered 2023-07-11: 4 via TOPICAL
  Filled 2023-07-11: qty 15

## 2023-07-11 MED ORDER — HEPARIN (PORCINE) IN NACL 1000-0.9 UT/500ML-% IV SOLN
INTRAVENOUS | Status: DC | PRN
  Administered 2023-07-11: 500 mL

## 2023-07-11 MED ORDER — FENTANYL CITRATE (PF) 100 MCG/2ML IJ SOLN
INTRAMUSCULAR | Status: AC
  Filled 2023-07-11: qty 2

## 2023-07-11 MED ORDER — IRBESARTAN 150 MG PO TABS
150.0000 mg | ORAL_TABLET | Freq: Every day | ORAL | Status: DC
  Administered 2023-07-11 – 2023-07-12 (×2): 150 mg via ORAL
  Filled 2023-07-11 (×2): qty 1

## 2023-07-11 MED ORDER — MIDAZOLAM HCL 5 MG/5ML IJ SOLN
INTRAMUSCULAR | Status: DC | PRN
  Administered 2023-07-11: 1 mg via INTRAVENOUS

## 2023-07-11 MED ORDER — LIDOCAINE HCL (PF) 1 % IJ SOLN
INTRAMUSCULAR | Status: DC | PRN
  Administered 2023-07-11: 60 mL

## 2023-07-11 MED ORDER — SODIUM CHLORIDE 0.9 % IV SOLN
INTRAVENOUS | Status: AC
  Filled 2023-07-11: qty 2

## 2023-07-11 SURGICAL SUPPLY — 13 items
CABLE SURGICAL S-101-97-12 (CABLE) ×1 IMPLANT
KIT ACCESSORY SELECTRA FIX CVD (MISCELLANEOUS) IMPLANT
LEAD SELECTRA 3D-55-42 (CATHETERS) IMPLANT
LEAD SOLIA S PRO MRI 53 (Lead) IMPLANT
LEAD SOLIA S PRO MRI 60 (Lead) IMPLANT
PACEMAKER AMVIA DR-T 460163 (Pacemaker) IMPLANT
PAD DEFIB RADIO PHYSIO CONN (PAD) ×1 IMPLANT
POUCH AIGIS-R ANTIBACT PPM (Mesh General) ×1 IMPLANT
POUCH AIGIS-R ANTIBACT PPM MED (Mesh General) IMPLANT
PPM AMVIA DR-T 460163 (Pacemaker) ×1 IMPLANT
SHEATH 7FR PRELUDE SNAP 13 (SHEATH) IMPLANT
SHEATH 9FR PRELUDE SNAP 13 (SHEATH) IMPLANT
TRAY PACEMAKER INSERTION (PACKS) ×1 IMPLANT

## 2023-07-11 NOTE — Progress Notes (Signed)
  Echocardiogram 2D Echocardiogram has been performed.  Delcie Roch 07/11/2023, 3:44 PM

## 2023-07-11 NOTE — Progress Notes (Addendum)
[facility]ENTER   MULTIDISCIPLINARY HEART VALVE TEAM  Patient Name: Brian Hunt Date of Encounter: 07/11/2023  Admit date: 07/10/2023  Primary Care Provider: Etta Grandchild, MD [facility] Cardiologist: Chilton Si, MD  [facility] Electrophysiologist:  None   Hospital Problem List     Principal Problem:   S/P TAVR (transcatheter aortic valve replacement) Active Problems:   Hyperlipidemia with target LDL less than 70   BPH (benign prostatic hyperplasia)   Aortic stenosis, severe   PAD (peripheral artery disease) (HCC)   COPD (chronic obstructive pulmonary disease) (HCC)   Heart failure with mildly reduced ejection fraction (HFmrEF) (HCC)   RBBB     Subjective   No complaints. Wife at bedside. Hungry  Inpatient Medications    Scheduled Meds:  aspirin EC  81 mg Oral Daily   Chlorhexidine Gluconate Cloth  6 each Topical Daily   cilostazol  50 mg Oral BID   irbesartan  150 mg Oral Daily   rosuvastatin  5 mg Oral Daily   sodium chloride flush  3 mL Intravenous Q12H   tamsulosin  0.4 mg Oral QHS   Continuous Infusions:  sodium chloride 10 mL/hr at 07/11/23 0700   sodium chloride 50 mL/hr at 07/11/23 0838   nitroGLYCERIN     PRN Meds: sodium chloride, acetaminophen **OR** acetaminophen, HYDROmorphone (DILAUDID) injection, ondansetron (ZOFRAN) IV, mouth rinse, sodium chloride flush, traMADol   Vital Signs    Vitals:   07/11/23 0600 07/11/23 0630 07/11/23 0700 07/11/23 0800  BP: 114/61 104/61 116/61   Pulse: 66 62 65   Resp: 12 13 17    Temp:    98.5 F (36.9 C)  TempSrc:    Oral  SpO2: 95% 94% 95%   Weight:      Height:        Intake/Output Summary (Last 24 hours) at 07/11/2023 0858 Last data filed at 07/11/2023 0700 Gross per 24 hour  Intake 1291.81 ml  Output 1135 ml  Net 156.81 ml   Filed Weights   07/10/23 0734 07/11/23 0500  Weight: 72.6 kg 74.7 kg    Physical Exam    GEN: Well nourished, well developed, in no acute  distress.  HEENT: Grossly normal.  Cardiac: RRR, no murmurs, rubs, or gallops. No clubbing, cyanosis, edema.  RIJ pacer Respiratory:  Respirations regular and unlabored, clear to auscultation bilaterally. GI: Soft, nontender, nondistended, BS + x 4. MS: no deformity or atrophy. Skin: warm and dry, no rash.  Groin sites clear without hematoma or ecchymosis  Neuro:  Strength and sensation are intact. Psych: AAOx3.  Normal affect.  Labs    CBC Recent Labs    07/10/23 1627 07/11/23 0235  WBC 11.6* 7.2  HGB 13.6 11.6*  HCT 40.7 34.4*  MCV 96.9 97.2  PLT 239 193   Basic Metabolic Panel Recent Labs    46/96/29 0838 07/10/23 1211 07/10/23 1251 07/11/23 0235  NA 132*   < > 134* 129*  K 4.0   < > 4.1 4.1  CL 96*   < > 100 98  CO2 25  --   --  24  GLUCOSE 103*   < > 100* 121*  BUN 11   < > 10 12  CREATININE 0.81   < > 0.70 0.85  CALCIUM 9.2  --   --  8.3*  MG  --   --   --  1.8   < > = values in this interval not displayed.   Liver  Function Tests Recent Labs    07/09/23 0838  AST 24  ALT 18  ALKPHOS 62  BILITOT 0.5  PROT 7.3  ALBUMIN 3.3*   No results for input(s): "LIPASE", "AMYLASE" in the last 72 hours. Cardiac Enzymes No results for input(s): "CKTOTAL", "CKMB", "CKMBINDEX", "TROPONINI" in the last 72 hours. BNP Invalid input(s): "POCBNP" D-Dimer No results for input(s): "DDIMER" in the last 72 hours. Hemoglobin A1C No results for input(s): "HGBA1C" in the last 72 hours. Fasting Lipid Panel No results for input(s): "CHOL", "HDL", "LDLCALC", "TRIG", "CHOLHDL", "LDLDIRECT" in the last 72 hours. Thyroid Function Tests No results for input(s): "TSH", "T4TOTAL", "T3FREE", "THYROIDAB" in the last 72 hours.  Invalid input(s): "FREET3"  Telemetry    Sinus with RBBB, PVCs, 7 beat NSVT, HR 60-70s - Personally Reviewed  ECG    Sinus with RBBB, HR 69  - Personally Reviewed  Radiology    Structural Heart Procedure  Result Date: 07/10/2023 See surgical note  for result.  CARDIAC CATHETERIZATION  Result Date: 07/10/2023 See surgical note for result.  ECHOCARDIOGRAM LIMITED  Result Date: 07/10/2023    ECHOCARDIOGRAM LIMITED REPORT   Patient Name:   Brian Hunt Date of Exam: 07/10/2023 Medical Rec #:  [id number]      Height:       67.0 in Accession #:    [id number]     Weight:       160.0 lb Date of Birth:  1946/01/25      BSA:          1.839 m Patient Age:    77 years       BP:           166/71 mmHg Patient Gender: M              HR:           51 bpm. Exam Location:  Inpatient Procedure: Limited Echo, Color Doppler, Cardiac Doppler and Echo Assisted            Procedure Indications:     TAVR implantation  History:         Patient has prior history of Echocardiogram examinations, most                  recent 05/31/2023. CHF, CKD, COPD and PAD, s/p #29 Sapien 3                  Ultra Resilia placed on 07/10/23; Risk Factors:Dyslipidemia.  Sonographer:     Milbert Coulter Referring Phys:  [id number] Clancy Leiner Diagnosing Phys: Riley Lam MD IMPRESSIONS  1. Prior to procedure, moderate to severe aortic stenosis- mean gradient 26 mm Hg, peak gradient 45 mm Hg, with AVA 1.27 cm2 but a DVI of 0.29     After procedure a 29 mm Sapien Valve was placed. Clip 21 reflects brief loss of pacing capture. Paravalular leak was noted against the interventricular septum, trace to at most mild at the 10 o'clock position on short axis and X plane assessment. Mean gradient 4 mm Hg, Peak gradient 8 mm HG with normal DVI and EOA 3.69 cm2. The aortic valve has been repaired/replaced. Aortic valve regurgitation is mild.  2. Left ventricular ejection fraction, by estimation, is 55%. The left ventricle has low normal function.  3. Right ventricular systolic function is normal. The right ventricular size is dilated.  4. The mitral valve is degenerative. FINDINGS  Left Ventricle: Left ventricular ejection fraction, by estimation, is 55%. The left  ventricle has low normal function. Right  Ventricle: The right ventricular size is dilated. Right ventricular systolic function is normal. Mitral Valve: The mitral valve is degenerative in appearance. Aortic Valve: Prior to procedure, moderate to severe aortic stenosis- mean gradient 26 mm Hg, peak gradient 45 mm Hg, with AVA 1.27 cm2 but a DVI of 0.29 After procedure a 29 mm Sapien Valve was placed. Clip 21 reflects brief loss of pacing capture. Paravalular leak was noted against the interventricular septum, trace to at most mild at the 10 o'clock position on short axis and X plane assessment. Mean gradient 4 mm Hg, Peak gradient 8 mm HG with normal DVI and EOA 3.69 cm2. The aortic valve has been repaired/replaced. Aortic valve regurgitation is mild. Aortic regurgitation PHT measures 491 msec. Aortic valve mean gradient measures 11.0 mmHg. Aortic valve peak gradient measures 15.9 mmHg. Aortic valve area, by VTI measures 2.21 cm. Additional Comments: Spectral Doppler performed. Color Doppler performed.  LEFT VENTRICLE PLAX 2D LVOT diam:     2.40 cm LV SV:         114 LV SV Index:   62 LVOT Area:     4.52 cm  AORTIC VALVE AV Area (Vmax):    2.22 cm AV Area (Vmean):   2.20 cm AV Area (VTI):     2.21 cm AV Vmax:           199.67 cm/s AV Vmean:          137.233 cm/s AV VTI:            0.518 m AV Peak Grad:      15.9 mmHg AV Mean Grad:      11.0 mmHg LVOT Vmax:         98.00 cm/s LVOT Vmean:        66.600 cm/s LVOT VTI:          0.253 m LVOT/AV VTI ratio: 0.49 AI PHT:            491 msec  SHUNTS Systemic VTI:  0.25 m Systemic Diam: 2.40 cm Riley Lam MD Electronically signed by Riley Lam MD Signature Date/Time: 07/10/2023/12:31:06 PM    Final      Patient Profile     Brian Hunt is a 77 y.o. male with a history of PAD with LE claudication, ongoing tobacco abuse, HTN, HLD, carotid disease, OSA on CPAP, hyponatremia, HFmrEF (EF 45-50%) and severe AS who presented to [facility] on 07/10/23 for planned TAVR.   Assessment & Plan     Severe AS: s/p successful TAVR with a 29 mm Ronning Sapien 3 Ultra Resilia THV via the TF approach on 07/10/23. Post operative echo to be completed today. Groin sites are stable. Continued on home Asprin 81mg  daily.    CHB: patient had a pre-existing RBBB and developed pacer dependant CHB s/p TAVR. He has regained his intrinsic rhythm and currently in sinus with old RBBB. Keep in ICU with internal jugular temp wire in. EP consulted. Will follow recommendations. Will order diet if no plans for PPM.  Post op urinary retention: bladder scan >500ccs and I/O cathed. Resumed on home Flowmax 0.4 MG. Will continue to monitor  Hyponatremia: Na 129. This is chronic for him.    HTN: BP well controlled.    HFmrEF: resume home telmisartan. Will follow EF on echo. Hopefully this will improve with treatment of his aortic stenosis. He does not appear volume overloaded and does not require diuretic therapy.   PAD: resumed  on home colostazol 50mg  BID.   Byrd Hesselbach, PA-C  07/11/2023, 8:58 AM  Pager [phone]  Patient seen, examined. Available data reviewed. Agree with findings, assessment, and plan as outlined by Carlean Jews, PA-C.  On exam, the patient is alert, oriented, in no distress.  HEENT is normal, JVP is normal, lungs are clear bilaterally, heart is regular rate and rhythm with no murmur gallop, bilateral groin sites are clear, right IJ sheath insertion site is clear, lower extremities with no edema.  Patient appears to be doing very well.  Appreciate EP consultation.  Patient initially had complete heart block post TAVR but returned to sinus rhythm late afternoon yesterday.  He remains in sinus rhythm this morning with a PR interval of 176 ms (156 ms on preop EKG).  Await further EP evaluation to determine permanent pacemaker versus outpatient monitoring.  Advised patient and his wife that we might give him 1 more day with a temporary wire in place before making a final decision, but  deferred to the EP team.  Okay to mobilize him a little at this point since he is no longer pacemaker dependent.  Otherwise as outlined above.  Tonny Bollman, M.D. 07/11/2023 12:45 PM

## 2023-07-12 ENCOUNTER — Inpatient Hospital Stay (HOSPITAL_COMMUNITY): Payer: Medicare PPO

## 2023-07-12 ENCOUNTER — Encounter (HOSPITAL_COMMUNITY): Payer: Self-pay | Admitting: Cardiology

## 2023-07-12 DIAGNOSIS — I442 Atrioventricular block, complete: Secondary | ICD-10-CM | POA: Insufficient documentation

## 2023-07-12 DIAGNOSIS — Z950 Presence of cardiac pacemaker: Secondary | ICD-10-CM | POA: Insufficient documentation

## 2023-07-12 LAB — CBC
HCT: 32.6 % — ABNORMAL LOW (ref 39.0–52.0)
Hemoglobin: 11.1 g/dL — ABNORMAL LOW (ref 13.0–17.0)
MCH: 32.8 pg (ref 26.0–34.0)
MCHC: 34 g/dL (ref 30.0–36.0)
MCV: 96.4 fL (ref 80.0–100.0)
Platelets: 169 10*3/uL (ref 150–400)
RBC: 3.38 MIL/uL — ABNORMAL LOW (ref 4.22–5.81)
RDW: 13.2 % (ref 11.5–15.5)
WBC: 6.8 10*3/uL (ref 4.0–10.5)
nRBC: 0 % (ref 0.0–0.2)

## 2023-07-12 LAB — BASIC METABOLIC PANEL
Anion gap: 9 (ref 5–15)
BUN: 16 mg/dL (ref 8–23)
CO2: 22 mmol/L (ref 22–32)
Calcium: 8.3 mg/dL — ABNORMAL LOW (ref 8.9–10.3)
Chloride: 99 mmol/L (ref 98–111)
Creatinine, Ser: 0.99 mg/dL (ref 0.61–1.24)
GFR, Estimated: >60 mL/min (ref >60)
Glucose, Bld: 100 mg/dL — ABNORMAL HIGH (ref 70–99)
Potassium: 4.3 mmol/L (ref 3.5–5.1)
Sodium: 130 mmol/L — ABNORMAL LOW (ref 135–145)

## 2023-07-12 MED FILL — Midazolam HCl Inj 2 MG/2ML (Base Equivalent): INTRAMUSCULAR | Qty: 1 | Status: AC

## 2023-07-12 NOTE — Progress Notes (Signed)
CARDIAC REHAB PHASE I   PRE:  Rate/Rhythm: 88 paced  BP:  Sitting: 107/57      SpO2: 94 RA  MODE:  Ambulation: 370 ft    POST:  Rate/Rhythm: 106 paced  BP:  Sitting: 134/54      SpO2: 94 RA  Pt ambulated with standby assistance, pt walked well denies chest pain and dyspnea. Pt educated on restrictions, HH diet, ex guidelines, and CRP2. Pt had a lot of great questions about exercise and he is looking forward to P2. Will refer to [facility].    Faustino Congress  MS, ACSM-CEP 9:26 AM 07/12/2023    Service time is from 0850 to 0926.

## 2023-07-12 NOTE — Discharge Instructions (Addendum)
ACTIVITY AND EXERCISE  Daily activity and exercise are an important part of your recovery. People recover at different rates depending on their general health and type of valve procedure.  Most people recovering from TAVR feel better relatively quickly   No lifting, pushing, pulling more than 10 pounds (examples to avoid: groceries, vacuuming, gardening, golfing):             - For one week with a procedure through the groin.             - For six weeks for procedures through the chest wall or neck. NOTE: You will typically see one of our providers 7-14 days after your procedure to discuss WHEN TO RESUME the above activities.      DRIVING  Do not drive until you are seen for follow up and cleared by a provider. Generally, we ask patient to not drive for 1 week after their procedure.  If you have been told by your doctor in the past that you may not drive, you must talk with him/her before you begin driving again.   DRESSING  Groin site: you may leave the clear dressing over the site for up to one week or until it falls off.   HYGIENE  If you had a femoral (leg) procedure, you may take a shower when you return home. After the shower, pat the site dry. Do NOT use powder, oils or lotions in your groin area until the site has completely healed.  If you had a chest procedure, you may shower when you return home unless specifically instructed not to by your discharging practitioner.             - DO NOT scrub incision; pat dry with a towel.             - DO NOT apply any lotions, oils, powders to the incision.             - No tub baths / swimming for at least 2 weeks.  If you notice any fevers, chills, increased pain, swelling, bleeding or pus, please contact your doctor.   ADDITIONAL INFORMATION  If you are going to have an upcoming dental procedure, please contact our office as you will require antibiotics ahead of time to prevent infection on your heart valve.    If you have any questions  or concerns you can call the structural heart phone during normal business hours 8am-4pm. If you have an urgent need after hours or weekends please call [phone] to talk to the on call provider for general cardiology. If you have an emergency that requires immediate attention, please call 911.    After TAVR Checklist  Check  Test Description   Follow up appointment in 1-2 weeks  You will see our structural heart advanced practice provider. Your incision sites will be checked and you will be cleared to drive and resume all normal activities if you are doing well.     1 month echo and follow up  You will have an echo to check on your new heart valve and be seen back in the office by a structural heart advanced practice provider.   Follow up with your primary cardiologist You will need to be seen by your primary cardiologist in the following 3-6 months after your 1 month appointment in the valve clinic.    1 year echo and follow up You will have another echo to check on your heart valve after 1 year  and be seen back in the office by a structural heart advanced practice provider. This your last structural heart visit.   Bacterial endocarditis prophylaxis  You will have to take antibiotics for the rest of your life before all dental procedures (even teeth cleanings) to protect your heart valve. Antibiotics are also required before some surgeries. Please check with your cardiologist before scheduling any surgeries. Also, please make sure to tell us if you have a penicillin allergy as you will require an alternative antibiotic.    After Your Pacemaker   You have a Biotronik Pacemaker  ACTIVITY Do not lift your arm above shoulder height for 1 week after your procedure. After 7 days, you may progress as below.  You should remove your sling 24 hours after your procedure, unless otherwise instructed by your provider.     Thursday July 19, 2023  Friday July 20, 2023 Saturday July 21, 2023  Sunday July 22, 2023   Do not lift, push, pull, or carry anything over 10 pounds with the affected arm until 6 weeks (Thursday August 23, 2023 ) after your procedure.   You may drive AFTER your wound check, unless you have been told otherwise by your provider.   Ask your healthcare provider when you can go back to work   INCISION/Dressing If you are on a blood thinner such as Coumadin, Xarelto, Eliquis, Plavix, or Pradaxa please confirm with your provider when this should be resumed.   If large square, outer bandage is left in place, this can be removed after 24 hours from your procedure. Do not remove steri-strips or glue as below.   If a PRESSURE DRESSING (a bulky dressing that usually goes up over your shoulder) was applied or left in place, please follow instructions given by your provider on when to return to have this removed.   Monitor your Pacemaker site for redness, swelling, and drainage. Call the device clinic at [phone] if you experience these symptoms or fever/chills.  If your incision is sealed with Steri-strips or staples, you may shower 7 days after your procedure or when told by your provider. Do not remove the steri-strips or let the shower hit directly on your site. You may wash around your site with soap and water.    If you were discharged in a sling, please do not wear this during the day more than 48 hours after your surgery unless otherwise instructed. This may increase the risk of stiffness and soreness in your shoulder.   Avoid lotions, ointments, or perfumes over your incision until it is well-healed.  You may use a hot tub or a pool AFTER your wound check appointment if the incision is completely closed.  Pacemaker Alerts:  Some alerts are vibratory and others beep. These are NOT emergencies. Please call our office to let us know. If this occurs at night or on weekends, it can wait until the next business day. Send a remote transmission.  If your  device is capable of reading fluid status (for heart failure), you will be offered monthly monitoring to review this with you.   DEVICE MANAGEMENT Remote monitoring is used to monitor your pacemaker from home. This monitoring is scheduled every 91 days by our office. It allows [place] to keep an eye on the functioning of your device to ensure it is working properly. You will routinely see your Electrophysiologist annually (more often if necessary).   You should receive your ID card for your new device in 4-8 weeks. Keep  this card with you at all times once received. Consider wearing a medical alert bracelet or necklace.  Your Pacemaker may be MRI compatible. This will be discussed at your next office visit/wound check.  You should avoid contact with strong electric or magnetic fields.   Do not use amateur (ham) radio equipment or electric (arc) welding torches. MP3 player headphones with magnets should not be used. Some devices are safe to use if held at least 12 inches (30 cm) from your Pacemaker. These include power tools, lawn mowers, and speakers. If you are unsure if something is safe to use, ask your health care provider.  When using your cell phone, hold it to the ear that is on the opposite side from the Pacemaker. Do not leave your cell phone in a pocket over the Pacemaker.  You may safely use electric blankets, heating pads, computers, and microwave ovens.  Call the office right away if: You have chest pain. You feel more short of breath than you have felt before. You feel more light-headed than you have felt before. Your incision starts to open up.  This information is not intended to replace advice given to you by your health care provider. Make sure you discuss any questions you have with your health care provider.

## 2023-07-12 NOTE — Progress Notes (Signed)
Patient discharged home with spouse. All belongings were returned and patient is satisfied. Discharge instructions were reviewed with patient and all questions were answered. Vital signs stable.

## 2023-07-12 NOTE — Plan of Care (Signed)
  Problem: Education: Goal: Knowledge of General Education information will improve Description: Including pain rating scale, medication(s)/side effects and non-pharmacologic comfort measures Outcome: Progressing   Problem: Health Behavior/Discharge Planning: Goal: Ability to manage health-related needs will improve Outcome: Progressing   Problem: Clinical Measurements: Goal: Ability to maintain clinical measurements within normal limits will improve Outcome: Progressing Goal: Will remain free from infection Outcome: Progressing Goal: Diagnostic test results will improve Outcome: Progressing Goal: Respiratory complications will improve Outcome: Progressing Goal: Cardiovascular complication will be avoided Outcome: Progressing   Problem: Activity: Goal: Risk for activity intolerance will decrease Outcome: Progressing   Problem: Nutrition: Goal: Adequate nutrition will be maintained Outcome: Progressing   Problem: Coping: Goal: Level of anxiety will decrease Outcome: Progressing   Problem: Elimination: Goal: Will not experience complications related to bowel motility Outcome: Progressing Goal: Will not experience complications related to urinary retention Outcome: Progressing   Problem: Pain Management: Goal: General experience of comfort will improve Outcome: Progressing   Problem: Safety: Goal: Ability to remain free from injury will improve Outcome: Progressing   Problem: Skin Integrity: Goal: Risk for impaired skin integrity will decrease Outcome: Progressing   Problem: Education: Goal: Knowledge of cardiac device and self-care will improve Outcome: Progressing Goal: Ability to safely manage health related needs after discharge will improve Outcome: Progressing Goal: Individualized Educational Video(s) Outcome: Progressing   Problem: Cardiac: Goal: Ability to achieve and maintain adequate cardiopulmonary perfusion will improve Outcome: Progressing

## 2023-07-12 NOTE — Progress Notes (Signed)
  Patient Name: Brian Hunt Date of Encounter: 07/12/2023  Primary Cardiologist: Chilton Si, MD Electrophysiologist: Dr. Lalla Brothers  Interval Summary   The patient is doing well today.  At this time, the patient denies chest pain, shortness of breath, or any new concerns.  Vital Signs    Vitals:   07/12/23 0600 07/12/23 0630 07/12/23 0700 07/12/23 0800  BP: 114/72 129/71 105/60 (!) 107/57  Pulse: 82 85 89 95  Resp: 19 17 19 18   Temp:      TempSrc:      SpO2: 91% 92% 90% 91%  Weight:      Height:        Intake/Output Summary (Last 24 hours) at 07/12/2023 0818 Last data filed at 07/11/2023 1400 Gross per 24 hour  Intake 734.5 ml  Output --  Net 734.5 ml   Filed Weights   07/10/23 0734 07/11/23 0500 07/12/23 0500  Weight: 72.6 kg 74.7 kg 74.9 kg    Physical Exam    GEN- The patient is well appearing, alert and oriented x 3 today.   Lungs- Clear to ausculation bilaterally, normal work of breathing Cardiac- Regular rate and rhythm, no murmurs, rubs or gallops GI- soft, NT, ND, + BS Extremities- no clubbing or cyanosis. No edema  Telemetry    NSR 70-90s (personally reviewed)  Hospital Course    Brian Hunt is a 77 y.o. male with a history of severe AS, moderate carotid disease, h/o CVA, HLD, HTN, PVD, and OSA on CPAP who is being seen today for the evaluation of post op CHB at the request of Dr. Excell Seltzer.   Assessment & Plan    Post operative CHB, intermittent RBBB at baseline Sinus bradycardia Echo 07/10/2023 showed LVEF 55% Cath 06/18/2023 Mild nonobstructive CAD  Now s/p Biotronik DDD PPM 10/23 with Dr. Lalla Brothers Wound care and arm restrictions to be reviewed when wife arrives.  CXR stable.  Usual follow up in place.    Severe AS s/p TVAR 10/22 Post op CHB, otherwise stable post op course.   For questions or updates, please contact [facility] Please consult [email or web address] for contact info under Cardiology/STEMI.  Signed, Graciella Freer, PA-C  07/12/2023, 8:18 AM

## 2023-07-12 NOTE — H&P (Signed)
Pt helped to BR, will return later.

## 2023-07-13 ENCOUNTER — Telehealth: Payer: Self-pay | Admitting: Physician Assistant

## 2023-07-13 NOTE — Anesthesia Postprocedure Evaluation (Signed)
Anesthesia Post Note  Patient: HAWKIN ZUMALT  Procedure(s) Performed: Transcatheter Aortic Valve Replacement, Transfemoral (Right) Possible Transcatheter Aortic Valve Replacement-Left Subclavian (Left) INTRAOPERATIVE TRANSTHORACIC ECHOCARDIOGRAM TEMPORARY PACEMAKER (Right)     Patient location during evaluation: Cath Lab Anesthesia Type: MAC Level of consciousness: awake and alert Pain management: pain level controlled Vital Signs Assessment: post-procedure vital signs reviewed and stable Respiratory status: spontaneous breathing, nonlabored ventilation, respiratory function stable and patient connected to nasal cannula oxygen Cardiovascular status: stable and blood pressure returned to baseline Postop Assessment: no apparent nausea or vomiting Anesthetic complications: yes   Encounter Notable Events  Notable Event Outcome Phase Comment  Cardiac arrest  Intraprocedure Inserted temporary venous pacemaker and left in place. Sent to ICU    Last Vitals:  Vitals:   07/12/23 1000 07/12/23 1100  BP: 122/67 126/72  Pulse:  73  Resp: 18 16  Temp:    SpO2:  90%    Last Pain:  Vitals:   07/12/23 0800  TempSrc: Oral  PainSc: 0-No pain                 Nathanal Hermiz

## 2023-07-13 NOTE — Telephone Encounter (Signed)
  HEART AND VASCULAR CENTER   MULTIDISCIPLINARY HEART VALVE TEAM   Patient contacted regarding discharge from [facility] on 10/24  Patient understands to follow up with a structural heart APP on 10/30 at [address] [facility].  Patient understands discharge instructions? yes Patient understands medications and regimen? yes Patient understands to bring all medications to this visit? yes  Cline Crock PA-C  [facility]

## 2023-07-17 NOTE — Progress Notes (Unsigned)
HEART AND VASCULAR CENTER   MULTIDISCIPLINARY HEART VALVE TEAM  Structural Heart Office Note:  .    Date:  07/18/2023  ID:  Brian Hunt, DOB October 30, 1945, MRN [id number] PCP: Etta Grandchild, MD  [facility] Providers Cardiologist:  Chilton Si, MD/ Dr. Excell Seltzer, MD and Dr. Laneta Simmers, MD (TAVR)   History of Present Illness: .   Brian Hunt is a 77 y.o. 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   [name] has been followed over time for aortic stenosis. His most recent echo on 05/31/2023 showed EF 45-50%, mild LVH, and severe AS with a mean grad 41 mmHg, AVA 0.70 cm2 and mod-severe MAC. EF 70% on previous echo in 09/2022. He reported fatigue and dyspnea on exertion with exercise at the gym. [facility] 06/18/23 showed mild nonobstructive disease.    The patient was evaluated by the multidisciplinary valve team and felt to have severe, symptomatic aortic stenosis and to be a suitable candidate and is now s/p successful TAVR with a 29 mm Awtrey Sapien 3 Ultra Resilia THV via the TF approach on 07/10/23. Post operative echo showed EF 65%, normally functioning TAVR with a mean gradient of 11 mmHg and no PVL as well as moderate MAC with with mild MS. Resumed on home Asprin 81mg  daily.   Today he is here alone and reports he is feeling great since discharge. His energy has improved since TAVR however still having very mild SOB but this too is improving. Otherwise denies chest pain, palpitations, LE edema, orthopnea, PND, dizziness, or syncope. Denies bleeding in stool or urine.    Physical Exam:    VS:  BP (!) 160/64   Pulse 87   Ht 5\' 7"  (1.702 m)   Wt 166 lb 12.8 oz (75.7 kg)   SpO2 95%   BMI 26.12 kg/m    Wt Readings from Last 3 Encounters:  07/18/23 166 lb 12.8 oz (75.7 kg)  07/12/23 165 lb 2 oz (74.9 kg)  07/05/23  164 lb (74.4 kg)    General: Well developed, well nourished, NAD Lungs:Clear to ausculation bilaterally. No wheezes, rales, or rhonchi. Breathing is unlabored. Cardiovascular: RRR with S1 S2. No murmur Extremities: No edema. Groin sites stable.  Neuro: Alert and oriented. No focal deficits. No facial asymmetry. MAE spontaneously. Psych: Responds to questions appropriately with normal affect.    ASSESSMENT AND PLAN: .    Severe AS: Patient doing well with NYHA class I symptoms s/p successful TAVR with a 29 mm Kincannon Sapien 3 Ultra Resilia THV via the TF approach on 07/10/23. Post operative echo showed EF 65%, normally functioning TAVR with a mean gradient of 11 mmHg and no PVL as well as moderate MAC with with mild MS. Groin sites remain stable. Continue Asprin 81mg  daily. Requires lifelong dental SBE. Amoxicillin discussed and sent to preferred pharm. 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 Lalla Brothers. CXR stable. Follow up with device team Monday.    HTN: Elevated today. Noted to be elevated since TAVR. Will attempt to increase Micardis to 80mg  daily and follow response. Previously tolerated this does with no issues.    HFmrEF: EF normalized to 65%. Appears euvolemic on  exam today.    Hyponatremia: Chronic issue with no neuro changes.    Thyroid nodule: pre TAVR scans showed a "mixed density 2.2 cm right thyroid isthmus nodule." No recommendations from radiology. Discussed today and thyroid US ordered.   Signed, Georgie Chard, NP

## 2023-07-18 ENCOUNTER — Telehealth (HOSPITAL_COMMUNITY): Payer: Self-pay

## 2023-07-18 ENCOUNTER — Ambulatory Visit: Payer: Medicare PPO | Attending: Physician Assistant | Admitting: Cardiology

## 2023-07-18 VITALS — BP 160/64 | HR 87 | Ht 67.0 in | Wt 166.8 lb

## 2023-07-18 DIAGNOSIS — I10 Essential (primary) hypertension: Secondary | ICD-10-CM

## 2023-07-18 DIAGNOSIS — E041 Nontoxic single thyroid nodule: Secondary | ICD-10-CM | POA: Diagnosis not present

## 2023-07-18 DIAGNOSIS — I350 Nonrheumatic aortic (valve) stenosis: Secondary | ICD-10-CM

## 2023-07-18 DIAGNOSIS — Z952 Presence of prosthetic heart valve: Secondary | ICD-10-CM

## 2023-07-18 DIAGNOSIS — I5022 Chronic systolic (congestive) heart failure: Secondary | ICD-10-CM

## 2023-07-18 DIAGNOSIS — I442 Atrioventricular block, complete: Secondary | ICD-10-CM

## 2023-07-18 MED ORDER — AMOXICILLIN 500 MG PO TABS: ORAL_TABLET | ORAL | 3 refills | Status: DC

## 2023-07-18 MED ORDER — TELMISARTAN 80 MG PO TABS: 80.0000 mg | ORAL_TABLET | Freq: Every day | ORAL | 3 refills | Status: DC

## 2023-07-18 NOTE — Telephone Encounter (Signed)
Pt insurance is active and benefits verified through [facility] Co-pay $20, DED 0/0 met, out of pocket $4,000/$1,706.37 met, co-insurance 0%. no pre-authorization required. Passport, 07/18/2023@4 :21, REF# [id number]  How many CR sessions are covered? (for ICR)72 Is this a lifetime maximum or an annual maximum? annual Has the member used any of these services to date? no Is there a time limit (weeks/months) on start of program and/or program completion? no  Will contact patient to see if he is interested in the Cardiac Rehab Program. If interested, patient will need to complete follow up appt. Once completed, patient will be contacted for scheduling upon review by the RN Navigator.

## 2023-07-18 NOTE — Patient Instructions (Addendum)
Medication Instructions:   INCREASE MICARDIS TO 80 MG A DAY   PROPHYLACTIC ANTIBIOTIC PRIOR TO DENTAL WORK  (Amoxicillin: Take 4 tablets (2000 mg) by mouth one hour prior to dental procedure) Sent to your pharmacy when needed.   *If you need a refill on your cardiac medications before your next appointment, please call your pharmacy*   Lab Work:  If you have labs (blood work) drawn today and your tests are completely normal, you will receive your results only by: MyChart Message (if you have MyChart) OR A paper copy in the mail If you have any lab test that is abnormal or we need to change your treatment, we will call you to review the results.   Testing/Procedures:    Follow-Up: At [facility], you and your health needs are our priority.  As part of our continuing mission to provide you with exceptional heart care, we have created designated Provider Care Teams.  These Care Teams include your primary Cardiologist (physician) and Advanced Practice Providers (APPs -  Physician Assistants and Nurse Practitioners) who all work together to provide you with the care you need, when you need it.  We recommend signing up for the patient portal called "MyChart".  Sign up information is provided on this After Visit Summary.  MyChart is used to connect with patients for Virtual Visits (Telemedicine).  Patients are able to view lab/test results, encounter notes, upcoming appointments, etc.  Non-urgent messages can be sent to your provider as well.   To learn more about what you can do with MyChart, go to [email or web address].

## 2023-07-23 ENCOUNTER — Ambulatory Visit: Payer: Medicare PPO | Attending: Student | Admitting: Student

## 2023-07-23 DIAGNOSIS — I10 Essential (primary) hypertension: Secondary | ICD-10-CM | POA: Diagnosis not present

## 2023-07-23 DIAGNOSIS — I442 Atrioventricular block, complete: Secondary | ICD-10-CM | POA: Diagnosis not present

## 2023-07-23 LAB — CUP PACEART INCLINIC DEVICE CHECK
Date Time Interrogation Session: 20241104085348
Implantable Lead Connection Status: 753985
Implantable Lead Connection Status: 753985
Implantable Lead Implant Date: 20241023
Implantable Lead Implant Date: 20241023
Implantable Lead Location: 753859
Implantable Lead Location: 753860
Implantable Lead Model: 377
Implantable Lead Model: 377
Implantable Lead Serial Number: 8001570721
Implantable Lead Serial Number: 8001632583
Implantable Pulse Generator Implant Date: 20241023
Pulse Gen Serial Number: 1000330918

## 2023-07-23 MED ORDER — METOPROLOL SUCCINATE ER 25 MG PO TB24: 25.0000 mg | ORAL_TABLET | Freq: Every day | ORAL | 3 refills | Status: DC

## 2023-07-23 NOTE — Progress Notes (Addendum)
Wound check appointment. Steri-strips removed. Wound without redness or edema. Incision edges approximated, wound well healed. Normal device function. Thresholds, sensing, and impedances consistent with implant measurements. Device programmed at 4.0V/auto capture programmed on for extra safety margin until 3 month visit. Histogram distribution appropriate for patient and level of activity. No mode switches or high ventricular rates noted. Patient educated about wound care, arm mobility, lifting restrictions. ROV in 3 months with implanting physician.  BP has been persistently elevated at home and HR fast today. Will add toprol 25 mg daily.

## 2023-07-23 NOTE — Patient Instructions (Signed)
Medication Instructions:  Start metoprolol succinate (Toprol XL) 25 mg daily *If you need a refill on your cardiac medications before your next appointment, please call your pharmacy*  Lab Work: None ordered If you have labs (blood work) drawn today and your tests are completely normal, you will receive your results only by: MyChart Message (if you have MyChart) OR A paper copy in the mail If you have any lab test that is abnormal or we need to change your treatment, we will call you to review the results.  Follow-Up: At [facility], you and your health needs are our priority.  As part of our continuing mission to provide you with exceptional heart care, we have created designated Provider Care Teams.  These Care Teams include your primary Cardiologist (physician) and Advanced Practice Providers (APPs -  Physician Assistants and Nurse Practitioners) who all work together to provide you with the care you need, when you need it.  Your next appointment:   As scheduled

## 2023-07-27 ENCOUNTER — Ambulatory Visit (HOSPITAL_BASED_OUTPATIENT_CLINIC_OR_DEPARTMENT_OTHER)
Admission: RE | Admit: 2023-07-27 | Discharge: 2023-07-27 | Disposition: A | Discharge status: Home / Self Care | Payer: Medicare PPO | Source: Ambulatory Visit | Attending: Cardiology | Admitting: Cardiology

## 2023-07-27 DIAGNOSIS — E041 Nontoxic single thyroid nodule: Secondary | ICD-10-CM | POA: Insufficient documentation

## 2023-07-27 DIAGNOSIS — E042 Nontoxic multinodular goiter: Secondary | ICD-10-CM | POA: Diagnosis not present

## 2023-07-29 DIAGNOSIS — G4733 Obstructive sleep apnea (adult) (pediatric): Secondary | ICD-10-CM | POA: Diagnosis not present

## 2023-08-02 ENCOUNTER — Ambulatory Visit (INDEPENDENT_AMBULATORY_CARE_PROVIDER_SITE_OTHER): Payer: Medicare PPO | Admitting: Otolaryngology

## 2023-08-02 ENCOUNTER — Encounter (INDEPENDENT_AMBULATORY_CARE_PROVIDER_SITE_OTHER): Payer: Self-pay

## 2023-08-02 VITALS — Ht 67.0 in | Wt 160.0 lb

## 2023-08-02 DIAGNOSIS — H35363 Drusen (degenerative) of macula, bilateral: Secondary | ICD-10-CM | POA: Diagnosis not present

## 2023-08-02 DIAGNOSIS — J310 Chronic rhinitis: Secondary | ICD-10-CM

## 2023-08-02 DIAGNOSIS — H903 Sensorineural hearing loss, bilateral: Secondary | ICD-10-CM

## 2023-08-02 DIAGNOSIS — H6123 Impacted cerumen, bilateral: Secondary | ICD-10-CM

## 2023-08-02 DIAGNOSIS — H2513 Age-related nuclear cataract, bilateral: Secondary | ICD-10-CM | POA: Diagnosis not present

## 2023-08-02 DIAGNOSIS — H353112 Nonexudative age-related macular degeneration, right eye, intermediate dry stage: Secondary | ICD-10-CM | POA: Diagnosis not present

## 2023-08-02 DIAGNOSIS — H353221 Exudative age-related macular degeneration, left eye, with active choroidal neovascularization: Secondary | ICD-10-CM | POA: Diagnosis not present

## 2023-08-05 DIAGNOSIS — J310 Chronic rhinitis: Secondary | ICD-10-CM | POA: Insufficient documentation

## 2023-08-05 DIAGNOSIS — H903 Sensorineural hearing loss, bilateral: Secondary | ICD-10-CM | POA: Insufficient documentation

## 2023-08-05 NOTE — Progress Notes (Signed)
Patient ID: Brian Hunt, male   DOB: 09-04-1946, 77 y.o.   MRN: [id number]  Follow-up: Hearing loss, recurrent cerumen impaction, nasal congestion  HPI: The patient is a 77 year old male who presents today for follow-up evaluation of his hearing loss.  The patient has a history of bilateral high-frequency sensorineural hearing loss and recurrent cerumen impaction.  He has been having difficulty understanding other people, especially in noisy environments.  The patient returns today reporting no significant change in his hearing.  He denies any otalgia, otorrhea, or vertigo.  Has has noted increasing clogging sensation in his ears.  He also complains of increasing nasal congestion lately.  He is not on any allergy medication at this time.  Exam: General: Communicates without difficulty, well nourished, no acute distress. Head: Normocephalic, no evidence injury, no tenderness, facial buttresses intact without stepoff. Face/sinus: No tenderness to palpation and percussion. Facial movement is normal and symmetric. Eyes: PERRL, EOMI. No scleral icterus, conjunctivae clear. Neuro: CN II exam reveals vision grossly intact.  No nystagmus at any point of gaze. Ears: Auricles well formed without lesions.  Bilateral cerumen impaction.  Nose: External evaluation reveals normal support and skin without lesions.  Dorsum is intact.  Anterior rhinoscopy reveals congested mucosa over anterior aspect of inferior turbinates and intact septum.  No purulence noted. Oral:  Oral cavity and oropharynx are intact, symmetric, without erythema or edema.  Mucosa is moist without lesions. Neck: Full range of motion without pain.  There is no significant lymphadenopathy.  No masses palpable.  Thyroid bed within normal limits to palpation.  Parotid glands and submandibular glands equal bilaterally without mass.  Trachea is midline. Neuro:  CN 2-12 grossly intact.    Procedure: Bilateral cerumen disimpaction Anesthesia:  None Description: Under the operating microscope, the cerumen is carefully removed with a combination of cerumen currette, alligator forceps, and suction catheters.  After the cerumen is removed, the TMs are noted to be normal.  No mass, erythema, or lesions. The patient tolerated the procedure well.    Assessment: 1.  Recurrent cerumen impaction.  After the disimpaction procedure, both tympanic membranes and middle ear spaces are noted to be normal. 2.  Chronic rhinitis with nasal mucosal congestion. 3.  Subjectively stable bilateral high-frequency sensorineural hearing loss.  Plan: 1.  Otomicroscopy with bilateral cerumen disimpaction. 2.  The physical exam findings are reviewed with the patient. 3.  Flonase nasal spray 2 sprays each nostril daily. 4.  The patient will return for reevaluation in 6 months.

## 2023-08-08 DIAGNOSIS — H353221 Exudative age-related macular degeneration, left eye, with active choroidal neovascularization: Secondary | ICD-10-CM | POA: Diagnosis not present

## 2023-08-10 NOTE — Progress Notes (Unsigned)
HEART AND VASCULAR CENTER   MULTIDISCIPLINARY HEART VALVE TEAM  Structural Heart Office Note:  .    Date:  08/14/2023  ID:  Brian Hunt, DOB May 30, 1946, MRN [id number] PCP: Etta Grandchild, MD  [facility] Providers Cardiologist:  Chilton Si, MD  History of Present Illness: .   Brian Hunt is a 77 y.o. 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   Brian Hunt has been followed over time for aortic stenosis. He began having issues with fatigue and DOE. Echo on 05/31/2023 showed EF 45-50%, mild LVH, and severe AS with a mean grad 41 mmHg, AVA 0.70 cm2 and mod-severe MAC. [facility] 06/18/23 showed mild nonobstructive disease.    He was referred and underwent multidisciplinary valve team evaluation and felt to have severe, symptomatic aortic stenosis and to be a suitable candidate and is now s/p successful TAVR with a 29 mm Hoefling Sapien 3 Ultra Resilia THV via the TF approach on 07/10/23. Post operative echo showed EF 65%, normally functioning TAVR with a mean gradient of 11 mmHg and no PVL as well as moderate MAC with with mild MS. Resumed on home Asprin 81mg  daily.   In follow up he was doing well. Seen by EP 07/23/23 for wound check with device thresholds, sensing, and impedances consistent with implant measurements. BP noted to be consistently elevated with increased HR therefore toprol 25 mg daily was added to regimen.   Today he presents alone and reports from a CV standpoint he continues to do well with no symptoms of chest pain, SOB, palpitations, LE edema, orthopnea, PND, dizziness, or syncope. Denies bleeding in stool or urine. He does report symptoms of depression which started after his procedure. He denies suicidal ideation. He has not spoken to family or friends about his feelings. We talked a long time  about options such as medication and counseling.    Physical Exam:   VS:  BP (!) 162/78   Pulse 60   Ht 5\' 7"  (1.702 m)   Wt 160 lb (72.6 kg)   SpO2 97%   BMI 25.06 kg/m    Wt Readings from Last 3 Encounters:  08/13/23 160 lb (72.6 kg)  08/02/23 160 lb (72.6 kg)  07/18/23 166 lb 12.8 oz (75.7 kg)    General: Well developed, well nourished, NAD Neck: Negative for carotid bruits. No JVD Lungs:Clear to ausculation bilaterally. No wheezes, rales, or rhonchi. Breathing is unlabored. Cardiovascular: RRR with S1 S2. No murmurs Extremities: No edema.  Neuro: Alert and oriented. No focal deficits. No facial asymmetry. MAE spontaneously. Psych: Responds to questions appropriately with normal affect.    ASSESSMENT AND PLAN: .    Severe AS: Patient doing well with NYHA class I symptoms s/p successful TAVR with a 29 mm Lindy Sapien 3 Ultra Resilia THV via the TF approach on 07/10/23. Echo today with normal LV, stable valve function with a mean gradient at , peak 11.14mmHg, AVA 3.59cm2 and DI 0.80. There is mild central AR and severe MAC with degenerative MV. Continue Asprin 81mg  daily. Continue lifelong dental SBE with Amoxicillin. Plan one year follow up with echo.   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 Expressed feelings today however denies suicidal ideation. Discussed medication and counseling as options. He wishes to speak to his PCP. Encouraged  to do this today. Patient agrees.    CHB: Developed pacer dependant CHB s/p TAVR and underwent . Biotronik DDD PPM placement 10/23.    HTN: Elevated today therefore amlodipine 5mg  added back to regimen. Consider titration if he continues to be elevated.     HFmrEF: EF normalized to 65%. Appears euvolemic on exam today.     Thyroid nodule: pre TAVR scans showed a 2.2 cm right thyroid isthmus nodule with follow up US showing cystic nodules which did not meet criteria for needle biopsy per report.      Cardiac  Rehabilitation Eligibility Assessment       I spent 35 minutes caring for this patient today including face-to-face discussions, ordering and reviewing labs, reviewing records from [facility] and other outside facilities, documenting in the record, and arranging for follow up.   Signed, Georgie Chard, NP

## 2023-08-13 ENCOUNTER — Ambulatory Visit (HOSPITAL_COMMUNITY): Payer: Medicare PPO | Attending: Physician Assistant | Admitting: Cardiology

## 2023-08-13 ENCOUNTER — Other Ambulatory Visit: Payer: Self-pay | Admitting: Cardiology

## 2023-08-13 ENCOUNTER — Other Ambulatory Visit (HOSPITAL_COMMUNITY): Payer: Medicare PPO

## 2023-08-13 ENCOUNTER — Ambulatory Visit (HOSPITAL_BASED_OUTPATIENT_CLINIC_OR_DEPARTMENT_OTHER): Payer: Medicare PPO

## 2023-08-13 VITALS — BP 162/78 | HR 60 | Ht 67.0 in | Wt 160.0 lb

## 2023-08-13 DIAGNOSIS — F329 Major depressive disorder, single episode, unspecified: Secondary | ICD-10-CM | POA: Diagnosis not present

## 2023-08-13 DIAGNOSIS — I442 Atrioventricular block, complete: Secondary | ICD-10-CM

## 2023-08-13 DIAGNOSIS — E041 Nontoxic single thyroid nodule: Secondary | ICD-10-CM | POA: Diagnosis not present

## 2023-08-13 DIAGNOSIS — Z952 Presence of prosthetic heart valve: Secondary | ICD-10-CM

## 2023-08-13 DIAGNOSIS — I10 Essential (primary) hypertension: Secondary | ICD-10-CM | POA: Diagnosis not present

## 2023-08-13 DIAGNOSIS — I350 Nonrheumatic aortic (valve) stenosis: Secondary | ICD-10-CM

## 2023-08-13 LAB — ECHOCARDIOGRAM COMPLETE
AV Mean grad: 6 mm[Hg]
AV Peak grad: 11.2 mm[Hg]
Ao pk vel: 1.68 m/s
Area-P 1/2: 2.5 cm2
S' Lateral: 2.7 cm

## 2023-08-13 MED ORDER — AMLODIPINE BESYLATE 5 MG PO TABS: 5.0000 mg | ORAL_TABLET | Freq: Every day | ORAL | 6 refills | Status: DC

## 2023-08-13 NOTE — Patient Instructions (Signed)
Medication Instructions:  START Norvasc 5mg  take 1 tablet once a day  *If you need a refill on your cardiac medications before your next appointment, please call your pharmacy*   Lab Work: None ordered If you have labs (blood work) drawn today and your tests are completely normal, you will receive your results only by: MyChart Message (if you have MyChart) OR A paper copy in the mail If you have any lab test that is abnormal or we need to change your treatment, we will call you to review the results.   Testing/Procedures: Your physician has requested that you have an echocardiogram. Echocardiography is a painless test that uses sound waves to create images of your heart. It provides your doctor with information about the size and shape of your heart and how well your heart's chambers and valves are working. This procedure takes approximately one hour. There are no restrictions for this procedure. Please do NOT wear cologne, perfume, aftershave, or lotions (deodorant is allowed). Please arrive 15 minutes prior to your appointment time.  Please note: We ask at that you not bring children with you during ultrasound (echo/ vascular) testing. Due to room size and safety concerns, children are not allowed in the ultrasound rooms during exams. Our front office staff cannot provide observation of children in our lobby area while testing is being conducted. An adult accompanying a patient to their appointment will only be allowed in the ultrasound room at the discretion of the ultrasound technician under special circumstances. We apologize for any inconvenience. Complete in 1 year   Follow-Up: At [facility], you and your health needs are our priority.  As part of our continuing mission to provide you with exceptional heart care, we have created designated Provider Care Teams.  These Care Teams include your primary Cardiologist (physician) and Advanced Practice Providers (APPs -  Physician  Assistants and Nurse Practitioners) who all work together to provide you with the care you need, when you need it.  We recommend signing up for the patient portal called "MyChart".  Sign up information is provided on this After Visit Summary.  MyChart is used to connect with patients for Virtual Visits (Telemedicine).  Patients are able to view lab/test results, encounter notes, upcoming appointments, etc.  Non-urgent messages can be sent to your provider as well.   To learn more about what you can do with MyChart, go to [email or web address].    Your next appointment:   FOLLOW UP AS SCHEDULED   Provider:   Chilton Si, MD    Other Instructions

## 2023-08-15 ENCOUNTER — Encounter (HOSPITAL_COMMUNITY): Payer: Self-pay

## 2023-08-20 ENCOUNTER — Telehealth: Payer: Self-pay | Admitting: Pharmacy Technician

## 2023-08-20 ENCOUNTER — Telehealth: Payer: Self-pay | Admitting: Cardiovascular Disease

## 2023-08-20 ENCOUNTER — Other Ambulatory Visit (HOSPITAL_COMMUNITY): Payer: Self-pay

## 2023-08-20 NOTE — Telephone Encounter (Signed)
Spoke to patient he stated he needs a prior auth for Pletal.[facility] Medicaid review # [phone].Advised I will send message to our prior auth team.

## 2023-08-20 NOTE — Telephone Encounter (Signed)
Pharmacy Patient Advocate Encounter  Received notification from [facility] that Prior Authorization for cilostazol has been APPROVED from 09/18/22 to 09/17/24   PA #/Case ID/Reference #: [id number]

## 2023-08-20 NOTE — Telephone Encounter (Signed)
Spoke to patient advised Pletal has been approved.

## 2023-08-20 NOTE — Telephone Encounter (Signed)
Pharmacy Patient Advocate Encounter   Received notification from Pt Calls Messages that prior authorization for cilostazol is required/requested.   Insurance verification completed.   The patient is insured through [employer] .   Per test claim: PA required; PA submitted to above mentioned insurance via CoverMyMeds Key/confirmation #/EOC [id number] Status is pending

## 2023-08-20 NOTE — Telephone Encounter (Signed)
Pt c/o medication issue:  1. Name of Medication:   cilostazol (PLETAL) 50 MG tablet    2. How are you currently taking this medication (dosage and times per day)?    3. Are you having a reaction (difficulty breathing--STAT)? No   4. What is your medication issue? Patient called to say that our office need called his insurance. To say why this patient need to take this medication. Please advise

## 2023-08-27 ENCOUNTER — Encounter: Payer: Self-pay | Admitting: Cardiovascular Disease

## 2023-08-27 ENCOUNTER — Ambulatory Visit: Payer: Medicare PPO | Attending: Cardiovascular Disease | Admitting: Cardiovascular Disease

## 2023-08-27 VITALS — BP 144/72 | HR 77 | Ht 67.0 in | Wt 171.2 lb

## 2023-08-27 DIAGNOSIS — I739 Peripheral vascular disease, unspecified: Secondary | ICD-10-CM

## 2023-08-27 DIAGNOSIS — I6522 Occlusion and stenosis of left carotid artery: Secondary | ICD-10-CM | POA: Diagnosis not present

## 2023-08-27 NOTE — Assessment & Plan Note (Signed)
History of peripheral arterial disease with known occluded SFAs bilaterally.  He did respond positively to Pletal.  He recently had TAVR for symptomatic aortic stenosis and is more active.  He does complain of some mild claudication after walking 1/4 mile which resolves with rest and he can walk after that.  He does not think he is at the point where he wishes to pursue an invasive evaluation.  Will continue to monitor him noninvasively.

## 2023-08-27 NOTE — Patient Instructions (Signed)
Medication Instructions:  Your physician recommends that you continue on your current medications as directed. Please refer to the Current Medication list given to you today.  *If you need a refill on your cardiac medications before your next appointment, please call your pharmacy*   Testing/Procedures: Your physician has requested that you have a carotid duplex. This test is an ultrasound of the carotid arteries in your neck. It looks at blood flow through these arteries that supply the brain with blood. Allow one hour for this exam. There are no restrictions or special instructions. This will take place at [address] [place], [address]. **To do in January**  Please note: We ask at that you not bring children with you during ultrasound (echo/ vascular) testing. Due to room size and safety concerns, children are not allowed in the ultrasound rooms during exams. Our front office staff cannot provide observation of children in our lobby area while testing is being conducted. An adult accompanying a patient to their appointment will only be allowed in the ultrasound room at the discretion of the ultrasound technician under special circumstances. We apologize for any inconvenience.  Your physician has requested that you have an Aorta/Iliac Duplex. This will be take place at [address] [facility], [address].  No food after 11PM the night before.  Water is OK. (Don't drink liquids if you have been instructed not to for ANOTHER test) Avoid foods that produce bowel gas, for 24 hours prior to exam (see below). No breakfast, no chewing gum, no smoking or carbonated beverages. Patient may take morning medications with water. Come in for test at least 15 minutes early to register. **To do in January**  Please note: We ask at that you not bring children with you during ultrasound (echo/ vascular) testing. Due to room size and safety concerns, children are not allowed in the ultrasound rooms during exams. Our  front office staff cannot provide observation of children in our lobby area while testing is being conducted. An adult accompanying a patient to their appointment will only be allowed in the ultrasound room at the discretion of the ultrasound technician under special circumstances. We apologize for any inconvenience.  Your physician has requested that you have a lower extremity arterial duplex. During this test, ultrasound is used to evaluate arterial blood flow in the legs. Allow one hour for this exam. There are no restrictions or special instructions. This will take place at [address] [facility], [address]. **To do in January**  Please note: We ask at that you not bring children with you during ultrasound (echo/ vascular) testing. Due to room size and safety concerns, children are not allowed in the ultrasound rooms during exams. Our front office staff cannot provide observation of children in our lobby area while testing is being conducted. An adult accompanying a patient to their appointment will only be allowed in the ultrasound room at the discretion of the ultrasound technician under special circumstances. We apologize for any inconvenience.  Your physician has requested that you have an ankle brachial index (ABI). During this test an ultrasound and blood pressure cuff are used to evaluate the arteries that supply the arms and legs with blood. Allow thirty minutes for this exam. There are no restrictions or special instructions. This will take place at [address] [facility], [address]. **To do in January**   Please note: We ask at that you not bring children with you during ultrasound (echo/ vascular) testing. Due to room size and safety concerns, children  are not allowed in the ultrasound rooms during exams. Our front office staff cannot provide observation of children in our lobby area while testing is being conducted. An adult accompanying a patient to their appointment will only be allowed in the  ultrasound room at the discretion of the ultrasound technician under special circumstances. We apologize for any inconvenience.      Follow-Up: At [facility], you and your health needs are our priority.  As part of our continuing mission to provide you with exceptional heart care, we have created designated Provider Care Teams.  These Care Teams include your primary Cardiologist (physician) and Advanced Practice Providers (APPs -  Physician Assistants and Nurse Practitioners) who all work together to provide you with the care you need, when you need it.  We recommend signing up for the patient portal called "MyChart".  Sign up information is provided on this After Visit Summary.  MyChart is used to connect with patients for Virtual Visits (Telemedicine).  Patients are able to view lab/test results, encounter notes, upcoming appointments, etc.  Non-urgent messages can be sent to your provider as well.   To learn more about what you can do with MyChart, go to [email or web address].    Your next appointment:   12 month(s)  Provider:   Nanetta Batty, MD

## 2023-08-27 NOTE — Assessment & Plan Note (Signed)
History of moderate left ICA stenosis by duplex ultrasound performed 09/25/2022.  This will be repeated on an annual basis.

## 2023-08-27 NOTE — Progress Notes (Signed)
08/27/2023 COLTYN BEIERLE   1946/06/15  [id number]  Primary Physician Etta Grandchild, MD Primary Cardiologist: Runell Gess MD Nicholes Calamity, [place]  HPI:  Brian Hunt is a 77 y.o.   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  He was referred by Dr. Duke Salvia, his cardiologist, for symptomatic claudication.  I last saw him in the office 05/01/2022.  His risk factors include 50 pack years of tobacco abuse having quit several years ago, treated hypertension and hyperlipidemia.  There is no family history for heart disease.  Is never had a heart attack or stroke.  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  He works out 3 times a week doing [employer]  He says that his calfs cause him discomfort when he walks and he has to stop periodically.  He had Doppler studies performed in our office 06/30/2021 revealing occluded SFAs bilaterally with a right ABI of 0.72 and a left of 0.66.  He also has bilateral carotid bruits   I began him on Pletal 50 mg p.o. twice daily which has afforded him significant improvement in his claudication symptoms.  He is not completely back to where he wants to be but is able to walk 2-3 times as long than previously before having to stop.  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   Since I saw him 16 months ago his claudication has significantly improved with Pletal and physical therapy.  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  He has more energy as well.   Current Meds  Medication Sig   amLODipine (NORVASC) 5 MG tablet Take 1 tablet (5 mg total) by mouth daily.   aspirin EC 81 MG tablet Take 81 mg by mouth daily.   cilostazol  (PLETAL) 50 MG tablet TAKE 1 TABLET(50 MG) BY MOUTH TWICE DAILY   famotidine (PEPCID) 20 MG tablet Take 20 mg by mouth daily as needed for heartburn or indigestion.   hydrocortisone cream 1 % Apply 1 Application topically daily as needed for itching.   metoprolol succinate (TOPROL-XL) 25 MG 24 hr tablet Take 1 tablet (25 mg total) by mouth daily. Take with or immediately following a meal.   Multiple Vitamins-Minerals (MULTIVITAMIN WITH MINERALS) tablet Take 1 tablet by mouth daily.   Propylene Glycol (SYSTANE BALANCE) 0.6 % SOLN Place 1 drop into both eyes as needed (dry eyes).   rosuvastatin (CRESTOR) 5 MG tablet TAKE 1 TABLET EVERY DAY   tamsulosin (FLOMAX) 0.4 MG CAPS capsule Take 0.4 mg by mouth at bedtime.   telmisartan (MICARDIS) 80 MG tablet Take 1 tablet (80 mg total) by mouth daily.   Tiotropium Bromide-Olodaterol (STIOLTO RESPIMAT) 2.5-2.5 MCG/ACT AERS Inhale 1 puff into the lungs daily.     Allergies  Allergen Reactions   Demeclocycline Nausea Only    Nausea and constipation   Pravastatin Other (See Comments)    myopathy   Lisinopril Cough   Oxycodone     Mental confusion    Social History   Socioeconomic History   Marital status: Married    Spouse name: Andrey Campanile   Number of children: 2   Years of education: Not on file   Highest  education level: Master's degree (e.g., MA, MS, MEng, MEd, MSW, MBA)  Occupational History   Occupation: retired  Tobacco Use   Smoking status: Former    Current packs/day: 0.00    Types: Cigarettes    Quit date: 06/14/1981    Years since quitting: 42.2   Smokeless tobacco: Never  Vaping Use   Vaping status: Never Used  Substance and Sexual Activity   Alcohol use: No    Comment: 2001- states no rehab   Drug use: No   Sexual activity: Yes  Other Topics Concern   Not on file  Social History Narrative      Lives with wife.    Social Determinants of Health   Financial Resource Strain: Low Risk  (07/05/2023)   Overall Financial  Resource Strain (CARDIA)    Difficulty of Paying Living Expenses: Not hard at all  Food Insecurity: No Food Insecurity (07/05/2023)   Hunger Vital Sign    Worried About Running Out of Food in the Last Year: Never true    Ran Out of Food in the Last Year: Never true  Transportation Needs: No Transportation Needs (07/05/2023)   PRAPARE - Administrator, Civil Service (Medical): No    Lack of Transportation (Non-Medical): No  Physical Activity: Insufficiently Active (07/05/2023)   Exercise Vital Sign    Days of Exercise per Week: 1 day    Minutes of Exercise per Session: 60 min  Stress: No Stress Concern Present (07/05/2023)   [employer] of Occupational Health - Occupational Stress Questionnaire    Feeling of Stress : Not at all  Social Connections: Moderately Integrated (07/05/2023)   Social Connection and Isolation Panel [NHANES]    Frequency of Communication with Friends and Family: Three times a week    Frequency of Social Gatherings with Friends and Family: Never    Attends Religious Services: Never    Database administrator or Organizations: Yes    Attends Banker Meetings: Never    Marital Status: Married  Catering manager Violence: Not At Risk (07/05/2023)   Humiliation, Afraid, Rape, and Kick questionnaire    Fear of Current or Ex-Partner: No    Emotionally Abused: No    Physically Abused: No    Sexually Abused: No     Review of Systems: General: negative for chills, fever, night sweats or weight changes.  Cardiovascular: negative for chest pain, dyspnea on exertion, edema, orthopnea, palpitations, paroxysmal nocturnal dyspnea or shortness of breath Dermatological: negative for rash Respiratory: negative for cough or wheezing Urologic: negative for hematuria Abdominal: negative for nausea, vomiting, diarrhea, bright red blood per rectum, melena, or hematemesis Neurologic: negative for visual changes, syncope, or dizziness All other systems  reviewed and are otherwise negative except as noted above.    Blood pressure (!) 144/72, pulse 77, height 5\' 7"  (1.702 m), weight 171 lb 3.2 oz (77.7 kg), SpO2 94%.  General appearance: alert and no distress Neck: no adenopathy, no JVD, supple, symmetrical, trachea midline, thyroid not enlarged, symmetric, no tenderness/mass/nodules, and soft bilateral carotid bruits Lungs: clear to auscultation bilaterally Heart: Soft outflow tract murmur Extremities: extremities normal, atraumatic, no cyanosis or edema Pulses: Diminished pedal pulses Skin: Skin color, texture, turgor normal. No rashes or lesions Neurologic: Grossly normal  EKG not performed today      ASSESSMENT AND PLAN:   Carotid stenosis History of moderate left ICA stenosis by duplex ultrasound performed 09/25/2022.  This will be repeated on an annual basis.  PAD (  peripheral artery disease) (HCC) History of peripheral arterial disease with known occluded SFAs bilaterally.  He did respond positively to Pletal.  He recently had TAVR for symptomatic aortic stenosis and is more active.  He does complain of some mild claudication after walking 1/4 mile which resolves with rest and he can walk after that.  He does not think he is at the point where he wishes to pursue an invasive evaluation.  Will continue to monitor him noninvasively.     Runell Gess MD FACP,FACC,FAHA, [facility] 08/27/2023 9:43 AM

## 2023-08-28 DIAGNOSIS — G4733 Obstructive sleep apnea (adult) (pediatric): Secondary | ICD-10-CM | POA: Diagnosis not present

## 2023-09-07 ENCOUNTER — Ambulatory Visit (HOSPITAL_BASED_OUTPATIENT_CLINIC_OR_DEPARTMENT_OTHER): Payer: Medicare PPO | Admitting: Family

## 2023-09-13 ENCOUNTER — Ambulatory Visit: Payer: Medicare PPO | Admitting: Internal Medicine

## 2023-09-13 ENCOUNTER — Encounter: Payer: Self-pay | Admitting: Internal Medicine

## 2023-09-13 ENCOUNTER — Ambulatory Visit (INDEPENDENT_AMBULATORY_CARE_PROVIDER_SITE_OTHER): Payer: Medicare PPO

## 2023-09-13 VITALS — BP 110/58 | HR 74 | Temp 97.8°F | Ht 67.0 in | Wt 169.4 lb

## 2023-09-13 DIAGNOSIS — E538 Deficiency of other specified B group vitamins: Secondary | ICD-10-CM

## 2023-09-13 DIAGNOSIS — N400 Enlarged prostate without lower urinary tract symptoms: Secondary | ICD-10-CM

## 2023-09-13 DIAGNOSIS — Z950 Presence of cardiac pacemaker: Secondary | ICD-10-CM | POA: Diagnosis not present

## 2023-09-13 DIAGNOSIS — F321 Major depressive disorder, single episode, moderate: Secondary | ICD-10-CM | POA: Diagnosis not present

## 2023-09-13 DIAGNOSIS — R202 Paresthesia of skin: Secondary | ICD-10-CM | POA: Diagnosis not present

## 2023-09-13 DIAGNOSIS — Z96611 Presence of right artificial shoulder joint: Secondary | ICD-10-CM | POA: Diagnosis not present

## 2023-09-13 DIAGNOSIS — Z471 Aftercare following joint replacement surgery: Secondary | ICD-10-CM | POA: Diagnosis not present

## 2023-09-13 DIAGNOSIS — I950 Idiopathic hypotension: Secondary | ICD-10-CM | POA: Insufficient documentation

## 2023-09-13 DIAGNOSIS — R059 Cough, unspecified: Secondary | ICD-10-CM | POA: Diagnosis not present

## 2023-09-13 DIAGNOSIS — Z0001 Encounter for general adult medical examination with abnormal findings: Secondary | ICD-10-CM | POA: Insufficient documentation

## 2023-09-13 DIAGNOSIS — Z0000 Encounter for general adult medical examination without abnormal findings: Secondary | ICD-10-CM | POA: Diagnosis not present

## 2023-09-13 DIAGNOSIS — R058 Other specified cough: Secondary | ICD-10-CM | POA: Diagnosis not present

## 2023-09-13 DIAGNOSIS — R200 Anesthesia of skin: Secondary | ICD-10-CM | POA: Diagnosis not present

## 2023-09-13 LAB — CBC WITH DIFFERENTIAL/PLATELET
Basophils Absolute: 0.1 10*3/uL (ref 0.0–0.1)
Basophils Relative: 0.5 % (ref 0.0–3.0)
Eosinophils Absolute: 0.2 10*3/uL (ref 0.0–0.7)
Eosinophils Relative: 2.3 % (ref 0.0–5.0)
HCT: 40.1 % (ref 39.0–52.0)
Hemoglobin: 13.6 g/dL (ref 13.0–17.0)
Lymphocytes Relative: 9.6 % — ABNORMAL LOW (ref 12.0–46.0)
Lymphs Abs: 1 10*3/uL (ref 0.7–4.0)
MCHC: 33.8 g/dL (ref 30.0–36.0)
MCV: 99.8 fL (ref 78.0–100.0)
Monocytes Absolute: 1.2 10*3/uL — ABNORMAL HIGH (ref 0.1–1.0)
Monocytes Relative: 12.3 % — ABNORMAL HIGH (ref 3.0–12.0)
Neutro Abs: 7.5 10*3/uL (ref 1.4–7.7)
Neutrophils Relative %: 75.3 % (ref 43.0–77.0)
Platelets: 235 10*3/uL (ref 150.0–400.0)
RBC: 4.02 Mil/uL — ABNORMAL LOW (ref 4.22–5.81)
RDW: 13.7 % (ref 11.5–15.5)
WBC: 9.9 10*3/uL (ref 4.0–10.5)

## 2023-09-13 LAB — BASIC METABOLIC PANEL
BUN: 19 mg/dL (ref 6–23)
CO2: 29 meq/L (ref 19–32)
Calcium: 9.1 mg/dL (ref 8.4–10.5)
Chloride: 96 meq/L (ref 96–112)
Creatinine, Ser: 0.91 mg/dL (ref 0.40–1.50)
GFR: 81.46 mL/min (ref >60.00)
Glucose, Bld: 108 mg/dL — ABNORMAL HIGH (ref 70–99)
Potassium: 4.4 meq/L (ref 3.5–5.1)
Sodium: 132 meq/L — ABNORMAL LOW (ref 135–145)

## 2023-09-13 LAB — POC COVID19 BINAXNOW: SARS Coronavirus 2 Ag: NEGATIVE

## 2023-09-13 LAB — POCT INFLUENZA A/B
Influenza A, POC: NEGATIVE
Influenza B, POC: NEGATIVE

## 2023-09-13 LAB — PSA: PSA: 3.9 ng/mL (ref 0.10–4.00)

## 2023-09-13 LAB — CORTISOL: Cortisol, Plasma: 7.7 ug/dL

## 2023-09-13 LAB — VITAMIN B12: Vitamin B-12: 265 pg/mL (ref 211–911)

## 2023-09-13 LAB — TSH: TSH: 2.8 u[IU]/mL (ref 0.35–5.50)

## 2023-09-13 LAB — FOLATE: Folate: 17 ng/mL (ref >5.9)

## 2023-09-13 MED ORDER — VORTIOXETINE HBR 5 MG PO TABS: 5.0000 mg | ORAL_TABLET | Freq: Every day | ORAL | 0 refills | Status: DC

## 2023-09-13 NOTE — Patient Instructions (Signed)
Health Maintenance, Male Adopting a healthy lifestyle and getting preventive care are important in promoting health and wellness. Ask your health care provider about: The right schedule for you to have regular tests and exams. Things you can do on your own to prevent diseases and keep yourself healthy. What should I know about diet, weight, and exercise? Eat a healthy diet  Eat a diet that includes plenty of vegetables, fruits, low-fat dairy products, and lean protein. Do not eat a lot of foods that are high in solid fats, added sugars, or sodium. Maintain a healthy weight Body mass index (BMI) is a measurement that can be used to identify possible weight problems. It estimates body fat based on height and weight. Your health care provider can help determine your BMI and help you achieve or maintain a healthy weight. Get regular exercise Get regular exercise. This is one of the most important things you can do for your health. Most adults should: Exercise for at least 150 minutes each week. The exercise should increase your heart rate and make you sweat (moderate-intensity exercise). Do strengthening exercises at least twice a week. This is in addition to the moderate-intensity exercise. Spend less time sitting. Even light physical activity can be beneficial. Watch cholesterol and blood lipids Have your blood tested for lipids and cholesterol at 77 years of age, then have this test every 5 years. You may need to have your cholesterol levels checked more often if: Your lipid or cholesterol levels are high. You are older than 77 years of age. You are at high risk for heart disease. What should I know about cancer screening? Many types of cancers can be detected early and may often be prevented. Depending on your health history and family history, you may need to have cancer screening at various ages. This may include screening for: Colorectal cancer. Prostate cancer. Skin cancer. Lung  cancer. What should I know about heart disease, diabetes, and high blood pressure? Blood pressure and heart disease High blood pressure causes heart disease and increases the risk of stroke. This is more likely to develop in people who have high blood pressure readings or are overweight. Talk with your health care provider about your target blood pressure readings. Have your blood pressure checked: Every 3-5 years if you are 18-39 years of age. Every year if you are 40 years old or older. If you are between the ages of 65 and 75 and are a current or former smoker, ask your health care provider if you should have a one-time screening for abdominal aortic aneurysm (AAA). Diabetes Have regular diabetes screenings. This checks your fasting blood sugar level. Have the screening done: Once every three years after age 45 if you are at a normal weight and have a low risk for diabetes. More often and at a younger age if you are overweight or have a high risk for diabetes. What should I know about preventing infection? Hepatitis B If you have a higher risk for hepatitis B, you should be screened for this virus. Talk with your health care provider to find out if you are at risk for hepatitis B infection. Hepatitis C Blood testing is recommended for: Everyone born from 1945 through 1965. Anyone with known risk factors for hepatitis C. Sexually transmitted infections (STIs) You should be screened each year for STIs, including gonorrhea and chlamydia, if: You are sexually active and are younger than 77 years of age. You are older than 77 years of age and your   health care provider tells you that you are at risk for this type of infection. Your sexual activity has changed since you were last screened, and you are at increased risk for chlamydia or gonorrhea. Ask your health care provider if you are at risk. Ask your health care provider about whether you are at high risk for HIV. Your health care provider  may recommend a prescription medicine to help prevent HIV infection. If you choose to take medicine to prevent HIV, you should first get tested for HIV. You should then be tested every 3 months for as long as you are taking the medicine. Follow these instructions at home: Alcohol use Do not drink alcohol if your health care provider tells you not to drink. If you drink alcohol: Limit how much you have to 0-2 drinks a day. Know how much alcohol is in your drink. In the [place] one drink equals one 12 oz bottle of beer (355 mL), one 5 oz glass of wine (148 mL), or one 1 oz glass of hard liquor (44 mL). Lifestyle Do not use any products that contain nicotine or tobacco. These products include cigarettes, chewing tobacco, and vaping devices, such as e-cigarettes. If you need help quitting, ask your health care provider. Do not use street drugs. Do not share needles. Ask your health care provider for help if you need support or information about quitting drugs. General instructions Schedule regular health, dental, and eye exams. Stay current with your vaccines. Tell your health care provider if: You often feel depressed. You have ever been abused or do not feel safe at home. Summary Adopting a healthy lifestyle and getting preventive care are important in promoting health and wellness. Follow your health care provider's instructions about healthy diet, exercising, and getting tested or screened for diseases. Follow your health care provider's instructions on monitoring your cholesterol and blood pressure. This information is not intended to replace advice given to you by your health care provider. Make sure you discuss any questions you have with your health care provider. Document Revised: 01/24/2021 Document Reviewed: 01/24/2021 Elsevier Patient Education  2024 Elsevier Inc.  

## 2023-09-13 NOTE — Progress Notes (Signed)
Subjective:  Patient ID: Brian Hunt, male    DOB: March 18, 1946  Age: 77 y.o. MRN: [id number]  CC: Annual Exam, Anemia, and Cough   HPI Brian Hunt presents for a CPX and f/up ---  Discussed the use of AI scribe software for clinical note transcription with the patient, who gave verbal consent to proceed.  History of Present Illness   The patient, with a history of aortic valve disease, presents with intermittent dizziness and shortness of breath. He had anticipated a complete resolution of these symptoms post valve surgery, but this has not been the case. The dizziness is severe enough to necessitate sitting down, but it is infrequent. He denies any palpitations.  Recently, the patient has developed congestion, which he describes as a cough productive of yellow sputum. This started the day before the consultation. He denies any associated dyspnea. He also reports a low-grade fever of 99.59F the night before the consultation.  The patient has been monitoring his blood pressure, which has been high post-surgery, with readings around 160/80. However, during this consultation, it was noted to be significantly lower at 104/56. He has been taking his medications, part in the morning and part in the evening, but denies any new medications.  He also reports an upcoming procedure involving an injection in his eye. He has not yet received a flu shot but is willing to get one. He denies any nausea, vomiting, or diarrhea. He has not had any previous episodes of pneumonia.       Outpatient Medications Prior to Visit  Medication Sig Dispense Refill   aspirin EC 81 MG tablet Take 81 mg by mouth daily.     cilostazol (PLETAL) 50 MG tablet TAKE 1 TABLET(50 MG) BY MOUTH TWICE DAILY 180 tablet 2   famotidine (PEPCID) 20 MG tablet Take 20 mg by mouth daily as needed for heartburn or indigestion.     hydrocortisone cream 1 % Apply 1 Application topically daily as needed for itching.     metoprolol  succinate (TOPROL-XL) 25 MG 24 hr tablet Take 1 tablet (25 mg total) by mouth daily. Take with or immediately following a meal. 90 tablet 3   Multiple Vitamins-Minerals (MULTIVITAMIN WITH MINERALS) tablet Take 1 tablet by mouth daily.     Propylene Glycol (SYSTANE BALANCE) 0.6 % SOLN Place 1 drop into both eyes as needed (dry eyes).     rosuvastatin (CRESTOR) 5 MG tablet TAKE 1 TABLET EVERY DAY 90 tablet 1   tamsulosin (FLOMAX) 0.4 MG CAPS capsule Take 0.4 mg by mouth at bedtime.     telmisartan (MICARDIS) 80 MG tablet Take 1 tablet (80 mg total) by mouth daily. 90 tablet 3   Tiotropium Bromide-Olodaterol (STIOLTO RESPIMAT) 2.5-2.5 MCG/ACT AERS Inhale 1 puff into the lungs daily.     amLODipine (NORVASC) 5 MG tablet Take 1 tablet (5 mg total) by mouth daily. 30 tablet 6   amoxicillin (AMOXIL) 500 MG tablet Take 4 tablets (2000 mg) by mouth one hour prior to dental procedure. (Patient not taking: Reported on 09/13/2023) 4 tablet 3   No facility-administered medications prior to visit.    ROS Review of Systems  Constitutional:  Positive for fever. Negative for appetite change, chills, diaphoresis and fatigue.  HENT: Negative.  Negative for sore throat and trouble swallowing.   Eyes:  Negative for visual disturbance.  Respiratory:  Positive for cough and shortness of breath. Negative for chest tightness and wheezing.   Cardiovascular:  Negative for chest pain,  palpitations and leg swelling.  Gastrointestinal:  Negative for abdominal pain, constipation, diarrhea, nausea and vomiting.  Genitourinary: Negative.  Negative for decreased urine volume, difficulty urinating and urgency.  Musculoskeletal: Negative.  Negative for arthralgias, myalgias, neck pain and neck stiffness.  Skin: Negative.  Negative for color change.  Neurological:  Positive for numbness. Negative for dizziness, tremors, weakness and light-headedness.  Hematological:  Negative for adenopathy. Does not bruise/bleed easily.   Psychiatric/Behavioral:  Positive for confusion, decreased concentration and dysphoric mood. Negative for self-injury, sleep disturbance and suicidal ideas. The patient is not nervous/anxious and is not hyperactive.        Sadness, apathy, anhedonia    Objective:  BP (!) 110/58 (BP Location: Left Arm, Patient Position: Sitting, Cuff Size: Normal) Comment: BP (R) 104/56 (L) 114/62  Pulse 74   Temp 97.8 F (36.6 C) (Oral)   Ht 5\' 7"  (1.702 m)   Wt 169 lb 6.4 oz (76.8 kg)   SpO2 97%   BMI 26.53 kg/m   BP Readings from Last 3 Encounters:  09/13/23 (!) 110/58  08/27/23 (!) 144/72  08/13/23 (!) 162/78    Wt Readings from Last 3 Encounters:  09/13/23 169 lb 6.4 oz (76.8 kg)  08/27/23 171 lb 3.2 oz (77.7 kg)  08/13/23 160 lb (72.6 kg)    Physical Exam Vitals reviewed.  Constitutional:      General: He is not in acute distress.    Appearance: He is not ill-appearing, toxic-appearing or diaphoretic.  HENT:     Nose: Nose normal.     Mouth/Throat:     Mouth: Mucous membranes are moist.  Eyes:     General: No scleral icterus.    Conjunctiva/sclera: Conjunctivae normal.  Cardiovascular:     Rate and Rhythm: Normal rate and regular rhythm.     Heart sounds: Murmur heard.     Systolic murmur is present with a grade of 1/6.     No diastolic murmur is present.     No gallop.     Comments: EKG- Atrial-paced rhythm, 60 bpm RBBB Unchanged Pulmonary:     Effort: Pulmonary effort is normal. No respiratory distress.     Breath sounds: No stridor. Examination of the right-lower field reveals rales. Examination of the left-lower field reveals rales. Rales present. No decreased breath sounds, wheezing or rhonchi.  Abdominal:     General: Abdomen is flat.     Palpations: There is no mass.     Tenderness: There is no abdominal tenderness. There is no guarding.     Hernia: No hernia is present.  Musculoskeletal:        General: Normal range of motion.     Cervical back: Normal range  of motion and neck supple.     Right lower leg: No edema.     Left lower leg: No edema.  Lymphadenopathy:     Cervical: No cervical adenopathy.  Skin:    General: Skin is warm and dry.     Coloration: Skin is not pale.     Findings: No rash.  Neurological:     General: No focal deficit present.     Mental Status: He is alert. Mental status is at baseline.  Psychiatric:        Mood and Affect: Mood normal.        Behavior: Behavior normal.     Lab Results  Component Value Date   WBC 9.9 09/13/2023   HGB 13.6 09/13/2023   HCT 40.1 09/13/2023  PLT 235.0 09/13/2023   GLUCOSE 108 (H) 09/13/2023   CHOL 112 04/17/2023   TRIG 43.0 04/17/2023   HDL 67.20 04/17/2023   LDLCALC 36 04/17/2023   ALT 18 07/09/2023   AST 24 07/09/2023   NA 132 (L) 09/13/2023   K 4.4 09/13/2023   CL 96 09/13/2023   CREATININE 0.91 09/13/2023   BUN 19 09/13/2023   CO2 29 09/13/2023   TSH 2.80 09/13/2023   PSA 3.90 09/13/2023   INR 1.1 07/09/2023   HGBA1C 5.7 04/17/2023    US THYROID Result Date: 07/29/2023 CLINICAL DATA:  Thyroid nodule EXAM: THYROID ULTRASOUND TECHNIQUE: Ultrasound examination of the thyroid gland and adjacent soft tissues was performed. COMPARISON:  None available. FINDINGS: Parenchymal Echotexture: Mildly heterogenous Isthmus: 0.5 cm Right lobe: 3.1 x 1.8 x 2.0 cm Left lobe: 3.1 x 1.5 x 1.1 cm _________________________________________________________ Estimated total number of nodules >/= 1 cm: 1 Number of spongiform nodules >/=  2 cm not described below (TR1): 0 Number of mixed cystic and solid nodules >/= 1.5 cm not described below (TR2): 0 _________________________________________________________ Nodule 1: 2.1 x 2.0 x 1.6 cm mixed solid cystic isoechoic right mid thyroid nodule (TI-RADS 2) corresponds to the abnormality seen on recent CT and does not meet criteria for FNA or imaging follow-up. _________________________________________________________ Nodule 2: 0.7 x 0.5 x 0.5 cm  mixed solid cystic isoechoic left inferior thyroid nodule (TI-RADS 2) does not meet criteria for imaging surveillance or FNA. IMPRESSION: Bilateral mixed solid cystic thyroid nodules do not meet criteria for FNA or imaging follow-up. The above is in keeping with the ACR TI-RADS recommendations - J Am Coll Radiol 2017;14:587-595. Electronically Signed   By: Acquanetta Belling M.D.   On: 07/29/2023 08:23   DG Chest 2 View Result Date: 09/13/2023 CLINICAL DATA:  Cough. EXAM: CHEST - 2 VIEW COMPARISON:  July 12, 2023. FINDINGS: The heart size and mediastinal contours are within normal limits. Both lungs are clear. Status post transcatheter aortic valve repair. Left-sided pacemaker is unchanged. Status post right shoulder arthroplasty. IMPRESSION: No active cardiopulmonary disease. Electronically Signed   By: Lupita Raider M.D.   On: 09/13/2023 11:20       Assessment & Plan:  B12 deficiency -     CBC with Differential/Platelet; Future -     Folate; Future -     Vitamin B12; Future  Idiopathic hypotension- The work-up is unremarkable. BP is over-controlled. Will discontinue the CCB. -     Basic metabolic panel; Future -     TSH; Future -     Cortisol; Future -     EKG 12-Lead  Productive cough- The work up is reassuring. -     DG Chest 2 View; Future -     POCT Influenza A/B -     POC COVID-19 BinaxNow  Benign prostatic hyperplasia without lower urinary tract symptoms -     PSA; Future  Encounter for general adult medical examination with abnormal findings- Exam completed, labs reviewed, vaccines reviewed, no cancer screenings indicated, pt ed material was given.   Current moderate episode of major depressive disorder without prior episode (HCC) -     Vortioxetine HBr; Take 1 tablet (5 mg total) by mouth daily.  Dispense: 30 tablet; Refill: 0  Numbness and tingling of both upper extremities -     Ambulatory referral to Neurology     Follow-up: Return in about 3 months (around  12/12/2023).  Sanda Linger, MD

## 2023-09-13 NOTE — Progress Notes (Signed)
6 

## 2023-09-14 DIAGNOSIS — H353221 Exudative age-related macular degeneration, left eye, with active choroidal neovascularization: Secondary | ICD-10-CM | POA: Diagnosis not present

## 2023-09-18 ENCOUNTER — Telehealth (HOSPITAL_COMMUNITY): Payer: Self-pay

## 2023-09-18 NOTE — Telephone Encounter (Signed)
 No response from in regards to CR.  Closed referral

## 2023-09-24 ENCOUNTER — Encounter: Payer: Self-pay | Admitting: Neurology

## 2023-09-24 ENCOUNTER — Encounter: Payer: Self-pay | Admitting: Internal Medicine

## 2023-09-26 ENCOUNTER — Encounter: Payer: Self-pay | Admitting: *Deleted

## 2023-09-26 ENCOUNTER — Telehealth: Payer: Self-pay | Admitting: Cardiovascular Disease

## 2023-09-26 NOTE — Telephone Encounter (Signed)
 Office called to say they need a letter on file stating the patient needs pre meds before appt. Fax number [phone]. Please advise

## 2023-09-26 NOTE — Telephone Encounter (Signed)
Letter generated and faxed to the number provided 

## 2023-09-28 DIAGNOSIS — G4733 Obstructive sleep apnea (adult) (pediatric): Secondary | ICD-10-CM | POA: Diagnosis not present

## 2023-10-02 ENCOUNTER — Ambulatory Visit (HOSPITAL_COMMUNITY)
Admission: RE | Admit: 2023-10-02 | Discharge: 2023-10-02 | Discharge status: Home / Self Care | Payer: Medicare PPO | Source: Ambulatory Visit | Attending: Cardiovascular Disease | Admitting: Cardiovascular Disease

## 2023-10-02 ENCOUNTER — Ambulatory Visit (HOSPITAL_COMMUNITY)
Admission: RE | Admit: 2023-10-02 | Discharge: 2023-10-02 | Disposition: A | Discharge status: Home / Self Care | Payer: Medicare PPO | Source: Ambulatory Visit | Attending: Cardiology | Admitting: Cardiology

## 2023-10-02 ENCOUNTER — Ambulatory Visit (HOSPITAL_BASED_OUTPATIENT_CLINIC_OR_DEPARTMENT_OTHER)
Admission: RE | Admit: 2023-10-02 | Discharge: 2023-10-02 | Disposition: A | Discharge status: Home / Self Care | Payer: Medicare PPO | Source: Ambulatory Visit | Attending: Cardiovascular Disease | Admitting: Cardiovascular Disease

## 2023-10-02 DIAGNOSIS — I739 Peripheral vascular disease, unspecified: Secondary | ICD-10-CM

## 2023-10-02 DIAGNOSIS — I6522 Occlusion and stenosis of left carotid artery: Secondary | ICD-10-CM | POA: Diagnosis not present

## 2023-10-04 LAB — VAS US ABI WITH/WO TBI
Left ABI: 0.65
Right ABI: 0.66

## 2023-10-08 ENCOUNTER — Other Ambulatory Visit (HOSPITAL_COMMUNITY): Payer: Self-pay

## 2023-10-08 DIAGNOSIS — I6523 Occlusion and stenosis of bilateral carotid arteries: Secondary | ICD-10-CM

## 2023-10-08 DIAGNOSIS — I739 Peripheral vascular disease, unspecified: Secondary | ICD-10-CM

## 2023-10-10 ENCOUNTER — Other Ambulatory Visit (HOSPITAL_COMMUNITY): Payer: Self-pay

## 2023-10-10 ENCOUNTER — Encounter (HOSPITAL_COMMUNITY): Payer: Self-pay | Admitting: Pharmacy Technician

## 2023-10-10 ENCOUNTER — Telehealth: Payer: Self-pay | Admitting: Pharmacy Technician

## 2023-10-10 NOTE — Telephone Encounter (Signed)
Pharmacy Patient Advocate Encounter   Received notification from CoverMyMeds that prior authorization for Trintellix (formerly Brintellix) 5MG  tablets is required/requested.   Insurance verification completed.   The patient is insured through [employer] .   Per test claim: PA required; PA submitted to above mentioned insurance via CoverMyMeds Key/confirmation #/EOC [id number] Status is pending

## 2023-10-10 NOTE — Telephone Encounter (Signed)
Error

## 2023-10-11 DIAGNOSIS — L570 Actinic keratosis: Secondary | ICD-10-CM | POA: Diagnosis not present

## 2023-10-11 DIAGNOSIS — L821 Other seborrheic keratosis: Secondary | ICD-10-CM | POA: Diagnosis not present

## 2023-10-11 DIAGNOSIS — Z85828 Personal history of other malignant neoplasm of skin: Secondary | ICD-10-CM | POA: Diagnosis not present

## 2023-10-11 DIAGNOSIS — D225 Melanocytic nevi of trunk: Secondary | ICD-10-CM | POA: Diagnosis not present

## 2023-10-11 DIAGNOSIS — D0421 Carcinoma in situ of skin of right ear and external auricular canal: Secondary | ICD-10-CM | POA: Diagnosis not present

## 2023-10-12 ENCOUNTER — Other Ambulatory Visit: Payer: Self-pay | Admitting: Internal Medicine

## 2023-10-12 DIAGNOSIS — F321 Major depressive disorder, single episode, moderate: Secondary | ICD-10-CM

## 2023-10-12 DIAGNOSIS — H353221 Exudative age-related macular degeneration, left eye, with active choroidal neovascularization: Secondary | ICD-10-CM | POA: Diagnosis not present

## 2023-10-12 MED ORDER — VILAZODONE HCL 20 MG PO TABS: 20.0000 mg | ORAL_TABLET | Freq: Every day | ORAL | 0 refills | Status: DC

## 2023-10-12 MED ORDER — VILAZODONE HCL 10 MG PO TABS: 10.0000 mg | ORAL_TABLET | Freq: Every day | ORAL | 0 refills | Status: DC

## 2023-10-12 NOTE — Telephone Encounter (Signed)
Pharmacy Patient Advocate Encounter  Received notification from [facility] that Prior Authorization for Trintellix 5mg  has been DENIED.  Full denial letter will be uploaded to the media tab. See denial reason below.   PA #/Case ID/Reference #: We cover this drug when our criteria are met. The unmet criteria are: has tried or cannot use a generic SSRI (for example: citalopram, fluoxetine, paroxetine, sertraline), OR SNRI (for example: venlafaxine or duloxetine), OR a generic bupropion product (75mg /100mg  IR, 100mg /150mg /200mg  SR, or 150mg /300mg  XL), OR mirtazapine. This decision was from [employer]s Trintellix (vortioxetine) Pharmacy Coverage Policy at [email or web address]

## 2023-10-16 ENCOUNTER — Telehealth: Payer: Self-pay

## 2023-10-16 ENCOUNTER — Other Ambulatory Visit (HOSPITAL_COMMUNITY): Payer: Self-pay

## 2023-10-16 NOTE — Telephone Encounter (Signed)
Pharmacy Patient Advocate Encounter   Received notification from  [facility] Portal that prior authorization for Vilazodone HCl 10MG  tablets is required/requested.   Insurance verification completed.   The patient is insured through [place] .   Per test claim: PA required; PA submitted to above mentioned insurance via CoverMyMeds Key/confirmation #/EOC  [id number] Status is pending

## 2023-10-17 ENCOUNTER — Other Ambulatory Visit (HOSPITAL_COMMUNITY): Payer: Self-pay

## 2023-10-17 DIAGNOSIS — N401 Enlarged prostate with lower urinary tract symptoms: Secondary | ICD-10-CM | POA: Diagnosis not present

## 2023-10-17 DIAGNOSIS — R351 Nocturia: Secondary | ICD-10-CM | POA: Diagnosis not present

## 2023-10-17 NOTE — Telephone Encounter (Signed)
Pharmacy Patient Advocate Encounter  Received notification from [facility] that Prior Authorization for Vilazodone HCl 10MG  tablets has been DENIED.  See denial reason below. No denial letter attached in CMM. Will attach denial letter to Media tab once received.   PA #/Case ID/Reference #: [id number] Robes

## 2023-10-17 NOTE — Telephone Encounter (Signed)
Patient has been made aware his medication has been denied.

## 2023-10-18 ENCOUNTER — Ambulatory Visit: Payer: Medicare PPO | Admitting: Internal Medicine

## 2023-10-27 ENCOUNTER — Encounter: Payer: Self-pay | Admitting: Internal Medicine

## 2023-10-29 ENCOUNTER — Other Ambulatory Visit: Payer: Self-pay | Admitting: Internal Medicine

## 2023-10-29 DIAGNOSIS — F321 Major depressive disorder, single episode, moderate: Secondary | ICD-10-CM

## 2023-10-29 DIAGNOSIS — G4733 Obstructive sleep apnea (adult) (pediatric): Secondary | ICD-10-CM | POA: Diagnosis not present

## 2023-10-30 ENCOUNTER — Other Ambulatory Visit: Payer: Self-pay | Admitting: Internal Medicine

## 2023-10-30 ENCOUNTER — Encounter (HOSPITAL_BASED_OUTPATIENT_CLINIC_OR_DEPARTMENT_OTHER): Payer: Self-pay | Admitting: Family

## 2023-10-30 ENCOUNTER — Ambulatory Visit (HOSPITAL_BASED_OUTPATIENT_CLINIC_OR_DEPARTMENT_OTHER): Payer: Medicare PPO | Admitting: Family

## 2023-10-30 VITALS — BP 130/74 | HR 70 | Ht 67.0 in | Wt 170.0 lb

## 2023-10-30 DIAGNOSIS — I6523 Occlusion and stenosis of bilateral carotid arteries: Secondary | ICD-10-CM

## 2023-10-30 DIAGNOSIS — I739 Peripheral vascular disease, unspecified: Secondary | ICD-10-CM

## 2023-10-30 DIAGNOSIS — I350 Nonrheumatic aortic (valve) stenosis: Secondary | ICD-10-CM

## 2023-10-30 DIAGNOSIS — I5022 Chronic systolic (congestive) heart failure: Secondary | ICD-10-CM

## 2023-10-30 DIAGNOSIS — E785 Hyperlipidemia, unspecified: Secondary | ICD-10-CM

## 2023-10-30 DIAGNOSIS — Z952 Presence of prosthetic heart valve: Secondary | ICD-10-CM | POA: Diagnosis not present

## 2023-10-30 DIAGNOSIS — F321 Major depressive disorder, single episode, moderate: Secondary | ICD-10-CM

## 2023-10-30 DIAGNOSIS — I442 Atrioventricular block, complete: Secondary | ICD-10-CM | POA: Diagnosis not present

## 2023-10-30 MED ORDER — VENLAFAXINE HCL ER 37.5 MG PO CP24: 37.5000 mg | ORAL_CAPSULE | Freq: Every day | ORAL | 0 refills | Status: DC

## 2023-10-30 NOTE — Progress Notes (Signed)
Cardiology Office Note:  .   Date:  10/30/2023  ID:  Brian Hunt, DOB [birth year], MRN [id number] PCP: Brian Grandchild, MD  [facility] Providers Cardiologist:  Brian Si, MD Sleep Medicine:  Brian Magic, MD    History of Present Illness: .   Brian Hunt is a 78 y.o. 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  He sees Dr. Allyson Hunt for PAD with last visit 08/27/23 with improvement in symptoms with Pletal.  Presents today for follow up. He has been overwhelmed with doctors appointments and feeling like he is not able to do his strenuous activity as before his TAVR. We discussed that to fully reach his normal capacity it does take time. He is very understanding and will opt to start to reach small goals and listen to his body. He is getting SOB intermittently that he attributes it to pushing himself. His BP at home have not been consistent, with some high and lower readings. The average was 131/ 80. He has been experiencing dizziness intermittently when standing up. We discussed orthostatic hypotension and the importance of changing positions slowly. He is hopeful that he will start to feel better and his mental health will improve as his doctor appointments decrease.   ROS: Please see the history of present illness.    All other systems reviewed and are negative.   Studies Reviewed: .        Cardiac Studies & Procedures   ______________________________________________________________________________________________ CARDIAC CATHETERIZATION  CARDIAC CATHETERIZATION 07/10/2023  Narrative See surgical note for result.   CARDIAC CATHETERIZATION  CARDIAC CATHETERIZATION 06/18/2023  Narrative   Prox LAD lesion is 50% stenosed.  1.  Mild nonobstructive coronary artery disease. 2.  Fick cardiac output  of 5.1 L/min and Fick cardiac index of 2.8 L/min/m with the following hemodynamics: Right atrial pressure mean of 8 mmHg Right ventricular pressure 45/2 with an end-diastolic pressure of 12 mmHg Wedge pressure mean of 16 mmHg with V waves to 20 mmHg PA pressure of 50/18 with a mean of 30 mmHg PVR of 2.74 Woods units PA pulsatility index of 4 3.  Capacious iliofemoral vessels bilaterally.  Recommendation: Continue evaluation for aortic valve intervention.  Findings Coronary Findings Diagnostic  Dominance: Right  Left Anterior Descending There is mild diffuse disease throughout the vessel. Prox LAD lesion is 50% stenosed.  Left Circumflex There is mild diffuse disease throughout the vessel.  Right Coronary Artery There is mild diffuse disease throughout the vessel.  Intervention  No interventions have been documented.   STRESS TESTS  MYOCARDIAL PERFUSION IMAGING 07/06/2021  Narrative   The study is normal. The study is low risk.   RBBB with repolarization changes was present at baseline with no ST changes from baseline during infusion.  Occsaional PVCs were noted.   LV perfusion is normal. There is no evidence of ischemia. There is no evidence of infarction.   Left ventricular function is normal. End diastolic cavity size is normal. End systolic cavity size is normal.   Prior study not available for comparison.   ECHOCARDIOGRAM  ECHOCARDIOGRAM COMPLETE 08/13/2023  Narrative ECHOCARDIOGRAM REPORT    Patient Name:   Brian Hunt Date of Exam: 08/13/2023 Medical Rec #:  [id number]      Height:       67.0 in Accession #:  [id number]     Weight:       160.0 lb Date of Birth:  11/03/1945      BSA:          1.839 m Patient Age:    77 years       BP:           162/78 mmHg Patient Gender: M              HR:           60 bpm. Exam Location:  [address]  Procedure: 2D Echo, Cardiac Doppler and Color Doppler  Indications:    Z95.2 Status post TAVR  History:         Patient has prior history of Echocardiogram examinations, most recent 07/11/2023. TAVR-20mm Wish S3UR. and Pacemaker, COPD, Arrythmias:RBBB; Risk Factors:Hypertension and Dyslipidemia. Obstructive sleep apnea-CPAP.  Sonographer:    Brian Hunt RDCS Referring Phys: [id number] Brian Hunt  IMPRESSIONS   1. Left ventricular ejection fraction, by estimation, is 60 to 65%. The left ventricle has normal function. The left ventricle has no regional wall motion abnormalities. There is mild left ventricular hypertrophy. Left ventricular diastolic parameters are indeterminate. 2. Device leads on RA/RV. Right ventricular systolic function is mildly reduced. The right ventricular size is mildly enlarged. 3. Left atrial size was severely dilated. 4. The mitral valve is degenerative. Trivial mitral valve regurgitation. No evidence of mitral stenosis. Severe mitral annular calcification. 5. 29 mm Sapien 3 valve no PVL mild central regurgitation stable low gardients. The aortic valve has been repaired/replaced. Aortic valve regurgitation is mild. No aortic stenosis is present. 6. The inferior vena cava is normal in size with greater than 50% respiratory variability, suggesting right atrial pressure of 3 mmHg.  FINDINGS Left Ventricle: Left ventricular ejection fraction, by estimation, is 60 to 65%. The left ventricle has normal function. The left ventricle has no regional wall motion abnormalities. The left ventricular internal cavity size was normal in size. There is mild left ventricular hypertrophy. Left ventricular diastolic parameters are indeterminate.  Right Ventricle: Device leads on RA/RV. The right ventricular size is mildly enlarged. No increase in right ventricular wall thickness. Right ventricular systolic function is mildly reduced.  Left Atrium: Left atrial size was severely dilated.  Right Atrium: Right atrial size was normal in size.  Pericardium: There is no  evidence of pericardial effusion.  The mitral valve is degenerative in appearance. There is moderate thickening of the mitral valve leaflet(s). There is moderate calcification of the mitral valve leaflet(s). Severe mitral annular calcification. Trivial mitral valve regurgitation. No evidence of mitral valve stenosis. Tricuspid Valve: The tricuspid valve is normal in structure. Tricuspid valve regurgitation is not demonstrated. No evidence of tricuspid stenosis.  29 mm Sapien 3 valve no PVL mild central regurgitation stable low gardients. The aortic valve has been repaired/replaced. Aortic valve regurgitation is mild. No aortic stenosis is present. Pulmonic Valve: The pulmonic valve was normal in structure. Pulmonic valve regurgitation is not visualized. No evidence of pulmonic stenosis.  Aorta: The aortic root is normal in size and structure.  Venous: The inferior vena cava is normal in size with greater than 50% respiratory variability, suggesting right atrial pressure of 3 mmHg.  IAS/Shunts: No atrial level shunt detected by color flow Doppler.  Additional Comments: A device lead is visualized.   LEFT VENTRICLE PLAX 2D LVIDd:         4.10 cm Diastology LVIDs:         2.70  cm LV e' medial:    6.96 cm/s LV PW:         1.20 cm LV E/e' medial:  16.5 LV IVS:        1.20 cm LV e' lateral:   6.70 cm/s LV E/e' lateral: 17.2   RIGHT VENTRICLE             IVC RV Basal diam:  4.40 cm     IVC diam: 1.10 cm RV S prime:     18.50 cm/s TAPSE (M-mode): 2.9 cm  LEFT ATRIUM             Index        RIGHT ATRIUM           Index LA diam:        6.20 cm 3.37 cm/m   RA Area:     17.60 cm LA Vol (A2C):   74.9 ml 40.72 ml/m  RA Volume:   49.90 ml  27.13 ml/m LA Vol (A4C):   81.9 ml 44.53 ml/m LA Biplane Vol: 79.8 ml 43.39 ml/m AORTIC VALVE AV Vmax:           167.60 cm/s AV Vmean:          113.020 cm/s AV VTI:            0.358 m AV Peak Grad:      11.2 mmHg AV Mean Grad:      6.0 mmHg LVOT  Vmax:         120.50 cm/s LVOT Vmean:        82.500 cm/s LVOT VTI:          0.286 m LVOT/AV VTI ratio: 0.80  AORTA Ao Root diam: 3.50 cm Ao Asc diam:  3.50 cm  MITRAL VALVE                TRICUSPID VALVE MV Area (PHT): 2.50 cm     TR Peak grad:   38.7 mmHg MV Peak grad:  11.8 mmHg    TR Vmax:        311.00 cm/s MV Mean grad:  4.0 mmHg MV Vmax:       1.72 m/s     SHUNTS MV Vmean:      92.7 cm/s    Systemic VTI: 0.29 m MV Decel Time: 303 msec MV E velocity: 115.00 cm/s MV A velocity: 170.50 cm/s MV E/A ratio:  0.67  Charlton Haws MD Electronically signed by Charlton Haws MD Signature Date/Time: 08/13/2023/9:43:16 AM    Final      CT SCANS  CT CORONARY MORPH W/CTA COR W/SCORE 07/02/2023  Addendum 07/05/2023  9:59 AM ADDENDUM REPORT: 07/05/2023 09:56  EXAM: OVER-READ INTERPRETATION  CT CHEST  The following report is an over-read performed by radiologist Dr. Lesia Hausen [facility] on 07/05/2023. This over-read does not include interpretation of cardiac or coronary anatomy or pathology. The cardiac CTA interpretation by the cardiologist is attached.  COMPARISON:  None.  FINDINGS: Please see the separate concurrent chest CT angiogram report for details.  IMPRESSION: Please see the separate concurrent chest CT angiogram report for details.   Electronically Signed By: Delbert Phenix M.D. On: 07/05/2023 09:56  Narrative CLINICAL DATA:  Aortic valve replacement (TAVR), pre-op eval  EXAM: Cardiac TAVR CT  TECHNIQUE: The patient was scanned on a Siemens Force 192 slice scanner. A 120 kV retrospective scan was triggered in the descending thoracic aorta at 111 HU's. Gantry rotation speed was 270 msecs and  collimation was .9 mm. The 3D data set was reconstructed in 5% intervals of the R-R cycle. Systolic and diastolic phases were analyzed on a dedicated work station using MPR, MIP and VRT modes. The patient received 75mL OMNIPAQUE IOHEXOL 350  MG/ML SOLN of contrast.  FINDINGS: Aortic Valve:  Tricuspid aortic valve with severely reduced cusp excursion. Severely thickened and severely calcified aortic valve cusps.  AV calcium score: 3608  Virtual Basal Annulus Measurements:  Maximum/Minimum Diameter: 30.2 x 26.7 mm  Perimeter: 88.4 mm  Area:  598 mm2  Mild LVOT calcifications under LCC at level of annulus.  Membranous septal length: 8 mm  Based on these measurements, the annulus would be suitable for a 29 mm Sapien 3 valve. Alternatively, Heart Team can consider 34 mm Evolut valve. Recommend Heart Team discussion for valve selection.  Sinus of Valsalva Measurements:  Non-coronary:  36 mm  Right - coronary:  36 mm  Left - coronary:  37 mm  Sinus of Valsalva Height:  Left: 27.8 mm  Right: 27.1 mm  Aorta: Conventional 3 vessel branch pattern of aortic arch. Severe atherosclerosis of the arch vessels.  Sinotubular Junction:  32 mm  Ascending Thoracic Aorta:  35 mm  Aortic Arch:  32 mm  Descending Thoracic Aorta:  27 mm  Coronary Artery Height above Annulus:  Left main: 21.7 mm  Right coronary: 22.3 mm  Coronary Arteries: Normal coronary origin. Right dominance. The study was performed without use of NTG and insufficient for plaque evaluation. Coronary artery calcium seen in 3 vessel distribution.  Optimum Fluoroscopic Angle for Delivery: LAO 22 CAU 12  OTHER:  Atria: Biatrial enlargement  Left atrial appendage: No thrombus.  Mitral valve: Grossly normal, mild mitral annular calcifications.  Pulmonary artery: Mild dilation, 33 mm.  Pulmonary veins: Normal anatomy.  IMPRESSION: 1. Tricuspid aortic valve with severely reduced cusp excursion. Severely thickened and severely calcified aortic valve cusps. 2. Aortic valve calcium score: 3608 3. Annulus area: 598 mm2, suitable for 29 mm Sapien 3 valve. Mild LVOT calcifications. Membranous septal length 8 mm. 4. Sufficient coronary artery  heights from annulus. 5. Optimum fluoroscopic angle for delivery: LAO 22 CAU 12  Electronically Signed: By: Weston Brass M.D. On: 07/02/2023 16:41     ______________________________________________________________________________________________      Risk Assessment/Calculations:         STOP-Bang Score:         Physical Exam:   VS:  BP 130/74 (BP Location: Left Arm, Patient Position: Sitting, Cuff Size: Normal)   Pulse 70   Ht 5\' 7"  (1.702 m)   Wt 170 lb (77.1 kg)   SpO2 96%   BMI 26.63 kg/m    Wt Readings from Last 3 Encounters:  10/30/23 170 lb (77.1 kg)  09/13/23 169 lb 6.4 oz (76.8 kg)  08/27/23 171 lb 3.2 oz (77.7 kg)    GEN: Well nourished, well developed in no acute distress NECK: No JVD; No carotid bruits CARDIAC: RRR, no murmurs, rubs, gallops RESPIRATORY:  Clear to auscultation without rales, wheezing or rhonchi  ABDOMEN: Soft, non-tender, non-distended EXTREMITIES:  No edema; No deformity   ASSESSMENT AND PLAN: .    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 He is doing strenuous activity at the gym and in his yard. No dyspnea with normal activities. No evidence of volume overload. Discussed setting small goals and listening to body as he is recovering. Anticipate simply continued recovery after TAVR. Discussed cardiac rehab and patient is not  interested at this time with busy appointment schedule but will consider if his activity level plateaus.  Continue SBE prophylaxis and Asprin 81 mg.   PAD - Followed by Dr. Allyson Hunt. Continue Aspirin, Pletal. Reports no claudication.   Nonobstructive CAD - LHC 05/2023 with pLAD 50% stenosed. Stable with no anginal symptoms. No indication for ischemic evaluation.  GDMT Aspirin, Rosuvastatin, Toprol. Recommend aiming for 150 minutes of moderate intensity activity per week and following a heart healthy diet.    HLD - 03/2023 LDL 36. Continue rosuvastatin 5 mg.   HFmrEF with normalization of LVEF - GDMT Toprol 25  mg and temisartan 80 mg. Appears euvolemic. No indication for loop diuretic at this time.   S/p PPM - Followed by Dr. Lalla Brothers. Appointment next week.   OSA - CPAP compliance encouraged. Endorses wearing regularly.   HTN - BP at home are inconsistent - average home BP 131/80. Continue telmisartan 80 mg, Toprol 25mg  daily. If feeling dizzy, check BP to ensure not dropping too low. Discussed to monitor BP at home at least 2 hours after medications and sitting for 5-10 minutes. He will contact us if routinely <110/60 or >140/80.        Dispo: Follow up with Dr. Duke Salvia in June.   Signed, Alver Sorrow, NP

## 2023-10-30 NOTE — Patient Instructions (Signed)
Medication Instructions:  Your physician recommends that you continue on your current medications as directed. Please refer to the Current Medication list given to you today.   Follow-Up: At [facility], you and your health needs are our priority.  As part of our continuing mission to provide you with exceptional heart care, we have created designated Provider Care Teams.  These Care Teams include your primary Cardiologist (physician) and Advanced Practice Providers (APPs -  Physician Assistants and Nurse Practitioners) who all work together to provide you with the care you need, when you need it.  We recommend signing up for the patient portal called "MyChart".  Sign up information is provided on this After Visit Summary.  MyChart is used to connect with patients for Virtual Visits (Telemedicine).  Patients are able to view lab/test results, encounter notes, upcoming appointments, etc.  Non-urgent messages can be sent to your provider as well.   To learn more about what you can do with MyChart, go to [email or web address].    Your next appointment:   June 2025  Provider:   Chilton Si, MD

## 2023-10-31 ENCOUNTER — Telehealth: Payer: Self-pay

## 2023-10-31 NOTE — Telephone Encounter (Signed)
Check to see if pt monitor transmitted 11/01/2023. His monitor was unplug. He has a Physiological scientist.

## 2023-11-01 NOTE — Telephone Encounter (Signed)
Transmission updated 11/01/2023.

## 2023-11-05 NOTE — Progress Notes (Signed)
 Initial neurology clinic note  Reason for Evaluation: Consultation requested by Brian Grandchild, MD for an opinion regarding numbness and tingling in bilateral hands. My final recommendations will be communicated back to the requesting physician by way of shared medical record or letter to requesting physician via [place] mail.  HPI: This is [name], a 78 y.o. right-handed male with a medical history of B12 deficiency, HTN, HLD, HFmrEF, PPM, PAD, COPD, aortic stenosis s/p TAVR, carotid stenosis, OSA (on CPAP), OA, and former tobacco use who presents to neurology clinic with the chief complaint of numbness and tingling in hands. The patient is alone today.  Patient's symptoms started in the left hand around 5 years ago. He would notice numbness and tingling in his hand after fishing. He notices symptoms more at night. He will shake his hand out or hold his hand off the bed to help symptoms. He also notices it in the morning, but generally not present during the day. He started noticing symptoms in the right hand about 2 weeks ago, but not as severe as in the right hand. He is not sure if his hands are week, but he has difficulty manipulating objects due to numbness. He has not had any testing or treatment for these symptoms.  He endorses occasional neck pain but nothing radiating down his arms.  Patient is retired. He former Runner, broadcasting/film/video and principal. He does work around his house and has used power tools a lot in the past.  He endorses PAD and had weakness in his legs for a while. He went to PT and on medications that have helped with this. He can now walk and bike and stay active much better. He used to having numbness and tingling in his feet, but this has improved with the above interventions.  He does not take any supplements regularly.  He report any constitutional symptoms like fever, night sweats, anorexia or unintentional weight loss.  EtOH use: None  Restrictive diet? No Family  history of neuropathy/myopathy/neurologic disease? Mother with strokes (older)   MEDICATIONS:  Outpatient Encounter Medications as of 11/14/2023  Medication Sig Note   amoxicillin (AMOXIL) 500 MG tablet Take 4 tablets (2000 mg) by mouth one hour prior to dental procedure.    aspirin EC 81 MG tablet Take 81 mg by mouth daily.    cilostazol (PLETAL) 50 MG tablet TAKE 1 TABLET(50 MG) BY MOUTH TWICE DAILY    famotidine (PEPCID) 20 MG tablet Take 20 mg by mouth daily as needed for heartburn or indigestion. 06/08/2023: [employer]    finasteride (PROSCAR) 5 MG tablet Take 5 mg by mouth daily.    hydrocortisone cream 1 % Apply 1 Application topically daily as needed for itching.    metoprolol succinate (TOPROL-XL) 25 MG 24 hr tablet Take 1 tablet (25 mg total) by mouth daily. Take with or immediately following a meal.    Multiple Vitamins-Minerals (MULTIVITAMIN WITH MINERALS) tablet Take 1 tablet by mouth daily.    Propylene Glycol (SYSTANE BALANCE) 0.6 % SOLN Place 1 drop into both eyes as needed (dry eyes).    rosuvastatin (CRESTOR) 5 MG tablet TAKE 1 TABLET EVERY DAY    tamsulosin (FLOMAX) 0.4 MG CAPS capsule Take 0.4 mg by mouth at bedtime.    telmisartan (MICARDIS) 80 MG tablet Take 1 tablet (80 mg total) by mouth daily.    Tiotropium Bromide-Olodaterol (STIOLTO RESPIMAT) 2.5-2.5 MCG/ACT AERS Inhale 1 puff into the lungs daily.    venlafaxine XR (  EFFEXOR XR) 37.5 MG 24 hr capsule Take 1 capsule (37.5 mg total) by mouth daily with breakfast.    No facility-administered encounter medications on file as of 11/14/2023.    PAST MEDICAL HISTORY: Past Medical History:  Diagnosis Date   Anemia    Anxiety    Aortic stenosis, severe 03/09/2018              Reading Physician    Reading Date    Result Priority      Quintella Reichert, MD [phone]    07/06/2021    Routine     Narrative & Impression       The study is normal. The study is low risk.    RBBB with repolarization changes was present at  baseline with no ST changes from baseline during infusion.  Occsaional PVCs were noted.    LV perfusion is normal. There is no evidence of is   Arthritis    Basal cell carcinoma    Basal Cell X2 , Dr Mayford Knife   BPH (benign prostatic hyperplasia)    Bursitis of left hip    Dr. 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   s/p TAVR with a 29 mm Kiely S3UR via the TF approach by Drs Excell Seltzer & Bartle    PAST SURGICAL HISTORY: Past Surgical History:  Procedure Laterality Date   COLONOSCOPY  2010   negative; [facility]   CYSTOSCOPY  10/2014   [facility]; neg   dislocation of right hip  2019   GREEN LIGHT LASER TURP (TRANSURETHRAL RESECTION OF PROSTATE  09/27/2012   Procedure: GREEN LIGHT LASER TURP (TRANSURETHRAL RESECTION OF PROSTATE;  Surgeon: [name], MD;  Location: [facility];  Service: Urology;  Laterality: N/A;      INTRAOPERATIVE TRANSTHORACIC ECHOCARDIOGRAM N/A 07/10/2023   Procedure: INTRAOPERATIVE TRANSTHORACIC ECHOCARDIOGRAM;  Surgeon: Tonny Bollman, MD;  Location: [facility] INVASIVE CV LAB;  Service: Cardiovascular;  Laterality: N/A;   JOINT REPLACEMENT Left Oct. 12, 2012   left total hip replacement by Dr. Turner Daniels   PACEMAKER IMPLANT N/A 07/11/2023   Procedure: PACEMAKER IMPLANT;  Surgeon: Lanier Prude, MD;  Location: [facility] INVASIVE CV LAB;  Service: Cardiovascular;  Laterality: N/A;   PROSTATE BIOPSY  2007 , 2009   X2 , Dr Cleatrice Burke  & Dr Vonita Moss    PROSTATE SURGERY     Laser   RIGHT/LEFT HEART CATH AND CORONARY ANGIOGRAPHY N/A 06/18/2023   Procedure: RIGHT/LEFT HEART CATH AND CORONARY ANGIOGRAPHY;  Surgeon: [name], MD;  Location: [facility] INVASIVE CV LAB;  Service: Cardiovascular;  Laterality: N/A;   TEMPORARY PACEMAKER Right 07/10/2023   Procedure: TEMPORARY PACEMAKER;  Surgeon: Tonny Bollman, MD;  Location: [facility] INVASIVE CV LAB;  Service: Cardiovascular;  Laterality: Right;   TOTAL HIP ARTHROPLASTY Right 09/26/2017   TOTAL HIP ARTHROPLASTY Right 09/26/2017   Procedure: TOTAL HIP ARTHROPLASTY ANTERIOR APPROACH;  Surgeon: Turner Daniels,  Homero Fellers, MD;  Location: [facility] OR;  Service: Orthopedics;  Laterality: Right;   TOTAL SHOULDER ARTHROPLASTY Right 08/17/2022   Procedure: TOTAL SHOULDER ARTHROPLASTY WITH AUGMENTED GLENOID;  Surgeon: Jones Broom, MD;  Location: [facility];  Service: Orthopedics;  Laterality: Right;   TRANSCATHETER AORTIC VALVE REPLACEMENT,SUBCLAVIAN Left 07/10/2023   Procedure: Possible Transcatheter Aortic Valve Replacement-Left Subclavian;  Surgeon: Tonny Bollman, MD;  Location: [facility] INVASIVE CV LAB;  Service: Cardiovascular;  Laterality: Left;    ALLERGIES: Allergies  Allergen Reactions   Demeclocycline Nausea Only    Nausea and constipation   Pravastatin Other (See Comments)    myopathy   Lisinopril Cough   Oxycodone     Mental confusion    FAMILY HISTORY: Family History  Problem Relation Age of Onset   Arthritis Mother    Vascular Disease Mother        Varicose Veins   Hypertension Mother    Hyperlipidemia Mother    Transient ischemic attack Mother    Heart disease Mother        After age 32   Stroke Mother    COPD Father    Bone cancer Maternal Grandmother    Heart attack Paternal Grandmother 60   Colon cancer Son 67   Turner syndrome Daughter    Asthma Neg Hx    Diabetes Neg Hx    Pancreatic cancer Neg Hx    Esophageal cancer Neg Hx    Stomach cancer Neg Hx    Liver disease Neg Hx    Rectal cancer  Neg Hx     SOCIAL HISTORY: Social History   Tobacco Use   Smoking status: Former    Current packs/day: 0.00    Types: Cigarettes    Quit date: 06/14/1981    Years since quitting: 42.4   Smokeless tobacco: Never  Vaping Use   Vaping status: Never Used  Substance Use Topics   Alcohol use: No    Comment: 2001- states no rehab   Drug use: No   Social History   Social History Narrative      Lives with wife.    Are you right handed or left handed? Right   Are you currently employed ?    What is your current occupation? retired   Do you live at home alone?   Who lives with you? wife   What type of home do you live in: 1 story or 2 story? two    Caffiene 5 cups a day     OBJECTIVE: PHYSICAL EXAM: BP (!) 150/72 Comment: pressure is being checked by cardio and see what works best.  Pulse 83   Ht 5\' 7"  (1.702 m)   Wt 172 lb (78 kg)   SpO2 98%   BMI 26.94 kg/m   General: General appearance: Awake and alert. No distress. Cooperative with exam.  Skin: No obvious rash or jaundice. HEENT: Atraumatic. Anicteric. Lungs: Non-labored breathing on room air  Extremities: No edema. Arthritic changes in bilateral hands. Watch worn on left wrist. Psych: Affect appropriate.  Neurological: Mental Status: Alert. Speech fluent. No pseudobulbar affect Cranial Nerves: CNII: No RAPD. Visual fields grossly intact. CNIII, IV, VI: PERRL. No nystagmus. EOMI. CN V: Facial sensation intact bilaterally to fine touch. CN VII: Facial muscles symmetric and strong. No ptosis at rest. CN VIII: Hearing grossly intact bilaterally. CN IX: No hypophonia. CN X: Palate elevates symmetrically. CN XI: Full strength shoulder shrug bilaterally. CN XII: Tongue protrusion full and midline. No atrophy or fasciculations. No  significant dysarthria Motor: Tone is normal. Atrophy of bilateral APB.  Individual muscle group testing (MRC grade out of 5):  Movement     Neck flexion 5    Neck extension 5      Right Left   Shoulder abduction 5 5   Elbow flexion 5 5   Elbow extension 5 5   Finger abduction - FDI 5- 5-   Finger abduction - ADM 5- 5-   Finger extension 5 5   Finger distal flexion - 2/3 5 5    Finger distal flexion - 4/5 5 5    Thumb flexion - FPL 5 5   Thumb abduction - APB 4 4    Hip flexion 5 5   Hip extension 5 5   Hip adduction 5 5   Hip abduction 5 5   Knee extension 5 5   Knee flexion 5 5   Dorsiflexion 5 5   Plantarflexion 5 5     Reflexes:  Right Left   Bicep 2+ 2+   Tricep 2+ 2+   BrRad 2+ 2+   Knee 2+ 2+   Ankle 0 0    Pathological Reflexes: Babinski: flexor response bilaterally Hoffman: absent bilaterally Troemner: absent bilaterally Sensation: Pinprick: Intact in all extremities. Tinel's and Phalen negative. Coordination: Intact finger-to- nose-finger bilaterally. Romberg with mild sway. Gait: Able to rise from chair with arms crossed unassisted. Normal, narrow-based gait.  Lab and Test Review: Internal labs: 09/13/23: B12: 265 Folate wnl CBC w/ diff unremarkable BMP significant for Na 132 (chronic), glucose 108 TSH wnl  04/17/23: Lipid panel: tChol 112, LDL 36, TG 43 HbA1c: 5.7  Imaging/Procedures: CTA neck (04/07/2011): Findings:  A standard three-vessel arch configuration is present.  Calcified plaque is present at the origin of the great vessels  without significant stenosis.   Both vertebral arteries originate from the subclavian arteries.  The vertebral arteries are codominant within the neck and  intracranially.  The vertebral basilar junction and visualized  basilar arteries is within normal limits.   The right common carotid artery is within normal limits.  Calcifications are present at the bifurcation and proximal right  internal carotid artery without significant stenosis relative to  the distal segment.  The remainder of the cervical and visualized  intracranial right internal carotid artery is within normal limits.    The left common carotid artery is within normal limits beyond the  origin.  There is extensive calcified and noncalcified plaque at  the left carotid bifurcation and extending into the proximal left  internal carotid artery.  The lumen is narrowed to 1.2 mm.  This  compares with a more distal segment of 4.1 mm for a calculated  stenosis of 70% by NASCET criteria.  The more distal left cervical  internal carotid artery is within normal limits.  Minimal  calcification is present within the visualized cavernous segment  without significant stenosis.   A dominant low density nodule within the right side of the thyroid  measures 1.9 x 1.3 x 1.9 cm.  The thyroid is otherwise  unremarkable.  No significant adenopathy is present.   Spondylosis of the cervical spine is most pronounced at C4-5 on the  right and bilaterally at C5-6 and C6-7.  Osseous foraminal stenosis  is evident on the left at C5-6 and on the right at C6-7.   Mild emphysematous changes are noted at the lung apices  bilaterally.    Review of the MIP images confirms the above findings.  IMPRESSION:   1. 70% stenosis of the proximal left internal carotid artery  secondary to calcified and noncalcified plaque.  2.  Calcified plaque at the proximal right internal carotid artery  and carotid bifurcation without significant stenosis by NASCET  criteria.  3. Calcified plaque at the origins of the great vessels without  significant stenosis.  4.  1.9 cm dominant right thyroid nodule.  Ultrasound or ultrasound  guided biopsy may be useful for further evaluation.  5.  Emphysema.   Echocardiogram (08/13/2023): 1. Left ventricular ejection fraction, by estimation, is 60 to 65%. The  left ventricle has normal function. The left ventricle has no regional  wall motion abnormalities. There is mild left ventricular hypertrophy.  Left ventricular diastolic parameters  are indeterminate.   2. Device leads on RA/RV. Right  ventricular systolic function is mildly  reduced. The right ventricular size is mildly enlarged.   3. Left atrial size was severely dilated.   4. The mitral valve is degenerative. Trivial mitral valve regurgitation.  No evidence of mitral stenosis. Severe mitral annular calcification.   5. 29 mm Sapien 3 valve no PVL mild central regurgitation stable low  gardients. The aortic valve has been repaired/replaced. Aortic valve  regurgitation is mild. No aortic stenosis is present.   6. The inferior vena cava is normal in size with greater than 50%  respiratory variability, suggesting right atrial pressure of 3 mmHg.   Carotid ultrasound (10/02/23): Summary:  Right Carotid: Velocities in the right ICA are consistent with a 1-39%  stenosis. Non-hemodynamically significant plaque <50% noted in the  CCA. The ECA appears >50% stenosed.   Left Carotid: Velocities in the left ICA are consistent with a 60-79%  stenosis. Non-hemodynamically significant plaque <50% noted in the CCA.   Vertebrals: Bilateral vertebral arteries demonstrate antegrade flow.  Subclavians: Normal flow hemodynamics were seen in bilateral subclavian arteries.   ASSESSMENT: MARTRELL EGUIA is a 78 y.o. male who presents for evaluation of pain and weakness in bilateral hands. He has a relevant medical history of B12 deficiency, HTN, HLD, HFmrEF, PPM, PAD, COPD, aortic stenosis s/p TAVR, carotid stenosis, OSA (on CPAP), OA, and former tobacco use. His neurological examination is pertinent for weakness of bilateral APB muscles. Available diagnostic data is significant for B12 of 265, HbA1c 5.7. 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 It is curious that symptoms are worse on the left when he is right handed though. I will get an EMG and NMUS to confirm pathology.  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 All questions were answered to their satisfaction.  When available, results of the above investigations and possible further recommendations will be communicated to the patient via telephone/MyChart. Patient to call office if not contacted after expected testing turnaround time.   Thank you for allowing me to participate in patient's care.  If I can answer any additional questions, I would be pleased to do so.  Jacquelyne Balint, MD   CC: Brian Grandchild, MD [address] [place] [address]  CC: Referring provider: Etta Grandchild, MD [address] [place],  [place] [address]

## 2023-11-07 ENCOUNTER — Ambulatory Visit: Payer: Medicare PPO | Admitting: Cardiology

## 2023-11-08 NOTE — Progress Notes (Unsigned)
 Cardiology Office Note:  .   Date:  11/08/2023  ID:  Brian Hunt, DOB 08-31-1946, MRN [id number] PCP: Brian Grandchild, MD  [facility] Providers Cardiologist:  Chilton Si, MD Electrophysiologist:  Lanier Prude, MD  Sleep Medicine:  Armanda Magic, MD {  History of Present Illness: .   Brian Hunt is a 78 y.o. male w/PMHx of HTN, HLD, RBBB, OSA w/CPAP  PVD (ABIs 06/2021 that revealed moderate disease bilaterally. Arterial dopplers showed 30-49% stenosis in the L common femoral and total occlusion of the L SFA. There was irregular plaque on the right that could not be quantified. Carotid dopplers showed moderate stenosis in the bilateral ICAs)  VHD w/severe AS   Admitted 07/10/23 >> TAVR >> CHB >> PPM  Saw Structural APP 08/13/23, denied SOB, mentioned perhaps a littled eperssion post procedure, planned to discuss further with his PMD Echo same day reported with normal LV, stable valve function with a mean gradient at , peak 11.1mmHg, AVA 3.59cm2 and DI 0.80. There is mild central AR and severe MAC with degenerative MV.   Saw Dr. Allyson Sabal, reported improved strength post TAVR, more energy. Mild claudication, no plans for invasive management at this time  Saw C walker, NP 10/30/23, reported less exertional capacity then pre-TAVR and disappointed, some orthostatic dizziness, fluctuating BPs, feeling overwhelmed with the the visits, symptoms.  Offered cardiac rehab > he declined No med changes  Today's visit is scheduled as his 91 day post implant visit  ROS:   He is doing OK Thinks he may have set his expectation a little to high, though does admit that heavier activities of late have been easire. He gives an example of cleaning out a drainage ditch last time a couple weeks ago got him pretty winded, this week, not as much He exercises regularly between the gym/home gym No CP, palpitations No near syncope or syncope He does get random poking type pain by his  pacer, worries him.  Device information Biotronik dual chamber PPM implanted 07/11/23   Studies Reviewed: Marland Kitchen    EKG not done today  DEVICE interrogation done today and reviewed by myself Battery and lead measurements are good RV threshold up just slightly though remains good Chronic outputs programmed NOT dependent AP 23%, VP zero% 2 very brief ATach (one labeled as a HVR episode, 14 and 23 seconds   08/13/23: TTE 1. Left ventricular ejection fraction, by estimation, is 60 to 65%. The  left ventricle has normal function. The left ventricle has no regional  wall motion abnormalities. There is mild left ventricular hypertrophy.  Left ventricular diastolic parameters  are indeterminate.   2. Device leads on RA/RV. Right ventricular systolic function is mildly  reduced. The right ventricular size is mildly enlarged.   3. Left atrial size was severely dilated.   4. The mitral valve is degenerative. Trivial mitral valve regurgitation.  No evidence of mitral stenosis. Severe mitral annular calcification.   5. 29 mm Sapien 3 valve no PVL mild central regurgitation stable low  gardients. The aortic valve has been repaired/replaced. Aortic valve  regurgitation is mild. No aortic stenosis is present.   6. The inferior vena cava is normal in size with greater than 50%  respiratory variability, suggesting right atrial pressure of 3 mmHg.   CARDIAC CATHETERIZATION 06/18/2023 Narrative   Prox LAD lesion is 50% stenosed.   1.  Mild nonobstructive coronary artery disease. 2.  Fick cardiac output of 5.1 L/min and Fick cardiac  index of 2.8 L/min/m with the following hemodynamics: Right atrial pressure mean of 8 mmHg Right ventricular pressure 45/2 with an end-diastolic pressure of 12 mmHg Wedge pressure mean of 16 mmHg with V waves to 20 mmHg PA pressure of 50/18 with a mean of 30 mmHg PVR of 2.74 Woods units PA pulsatility index of 4 3.  Capacious iliofemoral vessels bilaterally.    Recommendation: Continue evaluation for aortic valve intervention.  Echo  05/31/2023:  1. Left ventricular ejection fraction, by estimation, is 45 to 50%. Left  ventricular ejection fraction by 3D volume is 48 %. The left ventricle has  mildly decreased function. The left ventricle demonstrates global  hypokinesis. There is mild concentric  left ventricular hypertrophy. Left ventricular diastolic function could  not be evaluated.   2. Right ventricular systolic function is normal. The right ventricular  size is normal.   3. Left atrial size was moderately dilated.   4. Right atrial size was severely dilated.   5. The mitral valve is degenerative. Trivial mitral valve regurgitation.  No evidence of mitral stenosis. The mean mitral valve gradient is 4.0  mmHg. Moderate to severe mitral annular calcification.   6. The aortic valve is calcified. There is moderate calcification of the  aortic valve. There is severe thickening of the aortic valve. Aortic valve  regurgitation is mild. Severe aortic valve stenosis. Aortic valve area, by  VTI measures 0.79 cm. Aortic  valve mean gradient measures 41.0 mmHg. Aortic valve Vmax measures 3.90  m/s.   7. The inferior vena cava is normal in size with greater than 50%  respiratory variability, suggesting right atrial pressure of 3 mmHg.    Carotid Dopplers  09/25/2022: Summary:  Right Carotid: Velocities in the right ICA are consistent with a 1-39% stenosis. Unable to obtain right arm pressure due to recent shoulder surgery.   Left Carotid: Velocities in the left ICA are consistent with a 40-59% stenosis.   Vertebrals: Bilateral vertebral arteries demonstrate antegrade flow.  Subclavians: Normal flow hemodynamics were seen in bilateral subclavian arteries.    Risk Assessment/Calculations:    Physical Exam:   VS:  There were no vitals taken for this visit.   Wt Readings from Last 3 Encounters:  10/30/23 170 lb (77.1 kg)  09/13/23 169 lb 6.4  oz (76.8 kg)  08/27/23 171 lb 3.2 oz (77.7 kg)    GEN: Well nourished, well developed in no acute distress NECK: No JVD; No carotid bruits CARDIAC: RRR, no murmurs, rubs, gallops RESPIRATORY:  CTA b/l without rales, wheezing or rhonchi  ABDOMEN: Soft, non-tender, non-distended EXTREMITIES: No edema; No deformity   PPM site: is stable, no thinning, fluctuation, tethering, well healed  ASSESSMENT AND PLAN: .    PPM intact function chronic outputs programmed  He has some worry about his site/occ pinch/pain Will go ahead and have him back in a couple months  VHD S/p TAVR Sounds like he is doing very well C/w Dr. Brule/structural teams  HTN A recheck by myself is 146/80 He reports an avg BP at home is 131/80 Sometimes creeps up always settles back down Deferred to Dr. Duke Salvia     Dispo: as above, remotes otherwise as usual  Signed, Sheilah Pigeon, PA-C

## 2023-11-09 ENCOUNTER — Ambulatory Visit: Payer: Medicare PPO | Attending: Physician Assistant | Admitting: Physician Assistant

## 2023-11-09 ENCOUNTER — Encounter: Payer: Self-pay | Admitting: Physician Assistant

## 2023-11-09 VITALS — BP 156/92 | HR 75 | Ht 67.0 in | Wt 169.0 lb

## 2023-11-09 DIAGNOSIS — Z950 Presence of cardiac pacemaker: Secondary | ICD-10-CM

## 2023-11-09 DIAGNOSIS — M25511 Pain in right shoulder: Secondary | ICD-10-CM | POA: Diagnosis not present

## 2023-11-09 DIAGNOSIS — Z952 Presence of prosthetic heart valve: Secondary | ICD-10-CM | POA: Diagnosis not present

## 2023-11-09 DIAGNOSIS — I10 Essential (primary) hypertension: Secondary | ICD-10-CM | POA: Diagnosis not present

## 2023-11-09 LAB — CUP PACEART INCLINIC DEVICE CHECK
Date Time Interrogation Session: 20250221155720
Implantable Lead Connection Status: 753985
Implantable Lead Connection Status: 753985
Implantable Lead Implant Date: 20241023
Implantable Lead Implant Date: 20241023
Implantable Lead Location: 753859
Implantable Lead Location: 753860
Implantable Lead Model: 377
Implantable Lead Model: 377
Implantable Lead Serial Number: 8001570721
Implantable Lead Serial Number: 8001632583
Implantable Pulse Generator Implant Date: 20241023
Lead Channel Pacing Threshold Amplitude: 1.1 V
Lead Channel Pacing Threshold Amplitude: 1.2 V
Lead Channel Pacing Threshold Pulse Width: 0.4 ms
Lead Channel Pacing Threshold Pulse Width: 0.4 ms
Lead Channel Sensing Intrinsic Amplitude: 3.7 mV
Lead Channel Sensing Intrinsic Amplitude: 8.9 mV
Pulse Gen Serial Number: 1000330918

## 2023-11-09 NOTE — Patient Instructions (Signed)
 Medication Instructions:   Your physician recommends that you continue on your current medications as directed. Please refer to the Current Medication list given to you today.   *If you need a refill on your cardiac medications before your next appointment, please call your pharmacy*    Lab Work: NONE ORDERED  TODAY     If you have labs (blood work) drawn today and your tests are completely normal, you will receive your results only by: MyChart Message (if you have MyChart) OR A paper copy in the mail If you have any lab test that is abnormal or we need to change your treatment, we will call you to review the results.     Testing/Procedures: NONE ORDERED  TODAY       Follow-Up: At [facility], you and your health needs are our priority.  As part of our continuing mission to provide you with exceptional heart care, we have created designated Provider Care Teams.  These Care Teams include your primary Cardiologist (physician) and Advanced Practice Providers (APPs -  Physician Assistants and Nurse Practitioners) who all work together to provide you with the care you need, when you need it.  We recommend signing up for the patient portal called "MyChart".  Sign up information is provided on this After Visit Summary.  MyChart is used to connect with patients for Virtual Visits (Telemedicine).  Patients are able to view lab/test results, encounter notes, upcoming appointments, etc.  Non-urgent messages can be sent to your provider as well.   To learn more about what you can do with MyChart, go to [email or web address].    Your next appointment:    2 month(s)  ( CONTACT  CASSIE HALL/ ANGELINE HAMMER FOR EP SCHEDULING ISSUES )     Provider:    Francis Dowse, PA-C     Other Instructions    1st Floor: - Lobby - Registration  - Pharmacy  - Lab - Cafe  2nd Floor: - PV Lab - Diagnostic Testing (echo, CT, nuclear med)  3rd Floor: - Vacant  4th Floor: -  TCTS (cardiothoracic surgery) - AFib Clinic - Structural Heart Clinic - Vascular Surgery  - Vascular Ultrasound  5th Floor: - HeartCare Cardiology (general and EP) - Clinical Pharmacy for coumadin, hypertension, lipid, weight-loss medications, and med management appointments    Valet parking services will be available as well.

## 2023-11-13 ENCOUNTER — Encounter: Payer: Self-pay | Admitting: Cardiology

## 2023-11-13 DIAGNOSIS — H18413 Arcus senilis, bilateral: Secondary | ICD-10-CM | POA: Diagnosis not present

## 2023-11-13 DIAGNOSIS — H25043 Posterior subcapsular polar age-related cataract, bilateral: Secondary | ICD-10-CM | POA: Diagnosis not present

## 2023-11-13 DIAGNOSIS — H353221 Exudative age-related macular degeneration, left eye, with active choroidal neovascularization: Secondary | ICD-10-CM | POA: Diagnosis not present

## 2023-11-13 DIAGNOSIS — H2513 Age-related nuclear cataract, bilateral: Secondary | ICD-10-CM | POA: Diagnosis not present

## 2023-11-13 DIAGNOSIS — H2512 Age-related nuclear cataract, left eye: Secondary | ICD-10-CM | POA: Diagnosis not present

## 2023-11-13 DIAGNOSIS — H353112 Nonexudative age-related macular degeneration, right eye, intermediate dry stage: Secondary | ICD-10-CM | POA: Diagnosis not present

## 2023-11-14 ENCOUNTER — Ambulatory Visit: Payer: Medicare PPO | Admitting: Neurology

## 2023-11-14 ENCOUNTER — Encounter: Payer: Self-pay | Admitting: Neurology

## 2023-11-14 VITALS — BP 150/72 | HR 83 | Ht 67.0 in | Wt 172.0 lb

## 2023-11-14 DIAGNOSIS — G5603 Carpal tunnel syndrome, bilateral upper limbs: Secondary | ICD-10-CM | POA: Diagnosis not present

## 2023-11-14 DIAGNOSIS — M79641 Pain in right hand: Secondary | ICD-10-CM | POA: Diagnosis not present

## 2023-11-14 DIAGNOSIS — E538 Deficiency of other specified B group vitamins: Secondary | ICD-10-CM | POA: Diagnosis not present

## 2023-11-14 DIAGNOSIS — R29898 Other symptoms and signs involving the musculoskeletal system: Secondary | ICD-10-CM

## 2023-11-14 DIAGNOSIS — M79642 Pain in left hand: Secondary | ICD-10-CM

## 2023-11-14 NOTE — Patient Instructions (Signed)
 I saw you today for hand pain and weakness. Your symptoms are consistent with a nerve being compressed at your wrist, called carpal tunnel syndrome. I would like to prove this with a muscle and nerve test called EMG (see more information below). I will also look with an ultrasound at the same time as the EMG and to see if there is a reason the left is worse than the right.  In the meantime, I recommend cock up wrist splint for carpal tunnel symptoms, which can be bought in local drug stores or online. This should especially be worn at night while sleeping.     Your B12 was borderline low. Given your symptoms, I would recommend supplementing with B12 1000 mcg daily. This can be bought over the counter at any local drug store or online.   The physicians and staff at [facility] are committed to providing excellent care. You may receive a survey requesting feedback about your experience at our office. We strive to receive "very good" responses to the survey questions. If you feel that your experience would prevent you from giving the office a "very good " response, please contact our office to try to remedy the situation. We may be reached at [phone]. Thank you for taking the time out of your busy day to complete the survey.  Jacquelyne Balint, MD [facility]  ELECTROMYOGRAM AND NERVE CONDUCTION STUDIES (EMG/NCS) INSTRUCTIONS  How to Prepare The neurologist conducting the EMG will need to know if you have certain medical conditions. Tell the neurologist and other EMG lab personnel if you: Have a pacemaker or any other electrical medical device Take blood-thinning medications Have hemophilia, a blood-clotting disorder that causes prolonged bleeding Bathing Take a shower or bath shortly before your exam in order to remove oils from your skin. Don't apply lotions or creams before the exam.  What to Expect You'll likely be asked to change into a hospital gown for the procedure and lie  down on an examination table. The following explanations can help you understand what will happen during the exam.  Electrodes. The neurologist or a technician places surface electrodes at various locations on your skin depending on where you're experiencing symptoms. Or the neurologist may insert needle electrodes at different sites depending on your symptoms.  Sensations. The electrodes will at times transmit a tiny electrical current that you may feel as a twinge or spasm. The needle electrode may cause discomfort or pain that usually ends shortly after the needle is removed. If you are concerned about discomfort or pain, you may want to talk to the neurologist about taking a short break during the exam.  Instructions. During the needle EMG, the neurologist will assess whether there is any spontaneous electrical activity when the muscle is at rest - activity that isn't present in healthy muscle tissue - and the degree of activity when you slightly contract the muscle.  He or she will give you instructions on resting and contracting a muscle at appropriate times. Depending on what muscles and nerves the neurologist is examining, he or she may ask you to change positions during the exam.  After your EMG You may experience some temporary, minor bruising where the needle electrode was inserted into your muscle. This bruising should fade within several days. If it persists, contact your primary care doctor.

## 2023-11-15 DIAGNOSIS — H353221 Exudative age-related macular degeneration, left eye, with active choroidal neovascularization: Secondary | ICD-10-CM | POA: Diagnosis not present

## 2023-11-24 ENCOUNTER — Other Ambulatory Visit: Payer: Self-pay | Admitting: Internal Medicine

## 2023-11-24 DIAGNOSIS — F321 Major depressive disorder, single episode, moderate: Secondary | ICD-10-CM

## 2023-11-26 ENCOUNTER — Other Ambulatory Visit: Payer: Self-pay | Admitting: Internal Medicine

## 2023-11-26 DIAGNOSIS — F321 Major depressive disorder, single episode, moderate: Secondary | ICD-10-CM

## 2023-11-26 DIAGNOSIS — G4733 Obstructive sleep apnea (adult) (pediatric): Secondary | ICD-10-CM | POA: Diagnosis not present

## 2023-11-26 MED ORDER — VENLAFAXINE HCL ER 75 MG PO CP24: 75.0000 mg | ORAL_CAPSULE | Freq: Every day | ORAL | 0 refills | Status: DC

## 2023-12-07 ENCOUNTER — Other Ambulatory Visit: Payer: Self-pay | Admitting: Internal Medicine

## 2023-12-07 ENCOUNTER — Encounter: Payer: Self-pay | Admitting: Internal Medicine

## 2023-12-07 DIAGNOSIS — F321 Major depressive disorder, single episode, moderate: Secondary | ICD-10-CM

## 2023-12-07 MED ORDER — VENLAFAXINE HCL ER 37.5 MG PO CP24: 37.5000 mg | ORAL_CAPSULE | Freq: Every day | ORAL | 0 refills | Status: DC

## 2023-12-10 ENCOUNTER — Ambulatory Visit: Payer: Self-pay | Admitting: Neurology

## 2023-12-10 ENCOUNTER — Telehealth: Payer: Self-pay | Admitting: Neurology

## 2023-12-10 DIAGNOSIS — M5412 Radiculopathy, cervical region: Secondary | ICD-10-CM

## 2023-12-10 DIAGNOSIS — M79641 Pain in right hand: Secondary | ICD-10-CM | POA: Diagnosis not present

## 2023-12-10 DIAGNOSIS — M79642 Pain in left hand: Secondary | ICD-10-CM

## 2023-12-10 DIAGNOSIS — G5603 Carpal tunnel syndrome, bilateral upper limbs: Secondary | ICD-10-CM

## 2023-12-10 DIAGNOSIS — R29898 Other symptoms and signs involving the musculoskeletal system: Secondary | ICD-10-CM

## 2023-12-10 NOTE — Procedures (Signed)
 [facility]  [address] [place], [address]  [place], [place] [address] Tel: [phone] Fax: [phone] Test Date:  12/10/2023  Patient: Brian Hunt DOB: 02-21-1946 Physician: Jacquelyne Balint, MD  Sex: Male Height: 5\' 7"  Ref Phys: Jacquelyne Balint, MD  ID#: [id number]   Technician:    History: This is a 78 year old male with pain and weakness in his hands.  NCV & EMG Findings: Extensive electrodiagnostic evaluation of bilateral upper limbs shows: Left median sensory response is absent. Right median sensory response shows prolonged distal peak latency (5.3 ms) and reduced amplitude (6 V). Bilateral ulnar and radial sensory responses are within normal limits. Bilateral median (APB) motor responses show prolonged distal onset latency (L6.6, R5.5 ms) and reduced amplitude (L3.7, R3.0 mV). Bilateral ulnar (ADM) motor responses are within normal limits. Chronic motor axon loss changes without accompanying active denervation changes are seen in bilateral abductor pollicis brevis, left first dorsal interosseous, left extensor indicis proprius, and left cervical paraspinal (C7 level) muscles.  Neuromuscular Ultrasound Findings: High frequency (4.0-16.0 MHz) B-mode, nonvascular ultrasound of the left upper limb shows: Cross sectional areas (CSA) of the left median (palm to forearm) nerve are within normal limits. Wrist to forearm ratios of the left median nerve is increased (2.10). No other obvious lesion involving the adjacent bone or tendon is identified. No definite vascular abnormalities.  Impression: This is an abnormal study. The findings are most consistent with the following: Evidence of bilateral median mononeuropathy at or distal to the wrist, consistent with carpal tunnel syndrome. The findings are severe in degree electrically on the left and moderate in degree electrically on the right. The residuals of an old intraspinal canal lesion (ie: motor radiculopathy) at the left C8 root  or segment, mild in degree electrically. No electrodiagnostic evidence of a right cervical (C5-T1) motor radiculopathy.   ___________________________ Jacquelyne Balint, MD    NCS+ Motor Nerve Results    Latency Amplitude F-Lat Segment Distance CV Comment  Site (ms) Norm (mV) Norm (ms)  (cm) (m/s) Norm   Left Median (APB) Motor  Wrist *6.6  < 4.0 *3.7  > 5.0      Could not eliminate dip, moved x3  Elbow 11.1 - 3.5 -  Elbow-Wrist 31 69  > 50   Right Median (APB) Motor  Wrist *5.5  < 4.0 *3.0  > 5.0        Elbow 11.2 - 2.1 -  Elbow-Wrist 28.5 50  > 50   Left Ulnar (ADM) Motor  Wrist 2.5  < 3.1 13.1  > 7.0        Bel elbow 6.6 - 10.4 -  Bel elbow-Wrist 21 51  > 50   Ab elbow 8.6 - 9.4 -  Ab elbow-Bel elbow 10 50 -   Right Ulnar (ADM) Motor  Wrist 2.4  < 3.1 9.6  > 7.0        Bel elbow 5.8 - 9.1 -  Bel elbow-Wrist 22 65  > 50   Ab elbow 7.6 - 7.9 -  Ab elbow-Bel elbow 10 56 -    Sensory Sites    Neg Peak Lat Amplitude (O-P) Segment Distance Velocity Comment  Site (ms) Norm (V) Norm  (cm) (ms)   Left Median Sensory  Wrist-Dig II *NR  < 3.8 *NR  > 10 Wrist-Dig II 13    Right Median Sensory  Wrist-Dig II *5.3  < 3.8 *6  > 10 Wrist-Dig II 13    Left Radial Sensory  Forearm-Wrist 2.3  < 2.8 13  > 10 Forearm-Wrist 10    Right Radial Sensory  Forearm-Wrist 2.4  < 2.8 15  > 10 Forearm-Wrist 10    Left Ulnar Sensory  Wrist-Dig V 3.1  < 3.2 16  > 5 Wrist-Dig V 11    Right Ulnar Sensory  Wrist-Dig V 3.0  < 3.2 15  > 5 Wrist-Dig V 11     Nerve Measurements   Site Area Segment Area Ratio Mobility Vascularity Comment   mm Norm   Norm     Left Median  Palm 5.9         Wrist 12.2  < 13.0         Forearm 5.8  < 10.7  Wrist - Forearm *2.10  < 1.50       EMG+   Side Muscle Ins.Act Fibs Fasc Recrt Amp Dur Poly Activation Comment  Right FDI Nml Nml Nml Nml Nml Nml Nml Nml N/A  Right EIP Nml Nml Nml Nml Nml Nml Nml Nml N/A  Right Pronator teres Nml Nml Nml Nml Nml Nml Nml Nml N/A  Right  APB Nml Nml Nml *1- *1+ *1+ *1+ Nml N/A  Right Biceps Nml Nml Nml Nml Nml Nml Nml Nml N/A  Right Triceps Nml Nml Nml Nml Nml Nml Nml Nml N/A  Right Deltoid Nml Nml Nml Nml Nml Nml Nml Nml N/A  Left FDI Nml Nml Nml *1- *1+ *1+ Nml Nml N/A  Left EIP Nml Nml Nml *1- *1+ *1+ Nml Nml N/A  Left Pronator teres Nml Nml Nml Nml Nml Nml Nml Nml N/A  Left APB Nml Nml Nml *2- *1+ *1+ Nml Nml N/A  Left Biceps Nml Nml Nml Nml Nml Nml Nml Nml N/A  Left Triceps Nml Nml Nml Nml Nml Nml Nml Nml N/A  Left Deltoid Nml Nml Nml Nml Nml Nml Nml Nml N/A  Left C7 PSP Nml Nml Nml *1- *1+ *1+ Nml Nml N/A      Waveforms:  Motor           Sensory

## 2023-12-10 NOTE — Telephone Encounter (Signed)
 Discussed the results of patient's EMG after the procedure today. It showed bilateral carpal tunnel syndrome, severe on the left and moderate on the right. Given that patient is right handed and it is unusual that symptoms are worse on the left, I did NMUS that did not show any compressive lesion on the left median nerve at the wrist such as cyst, etc.  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  Patient will try wearing his braces and let me know if symptoms persist or worsen, in which case I will recommend referral to hand surgery.  All questions were answered.  Jacquelyne Balint, MD [facility]

## 2023-12-17 DIAGNOSIS — H353221 Exudative age-related macular degeneration, left eye, with active choroidal neovascularization: Secondary | ICD-10-CM | POA: Diagnosis not present

## 2023-12-23 NOTE — Progress Notes (Unsigned)
 Cardiology Office Note:  .   Date:  12/23/2023  ID:  HAMP MORELAND, DOB [birth year], MRN [id number] PCP: Etta Grandchild, MD  [facility] Providers Cardiologist:  Chilton Si, MD Electrophysiologist:  Lanier Prude, MD  Sleep Medicine:  Armanda Magic, MD {  History of Present Illness: .   Brian Hunt is a 78 y.o. male w/PMHx of HTN, HLD, RBBB, OSA w/CPAP  PVD (ABIs 06/2021 that revealed moderate disease bilaterally. Arterial dopplers showed 30-49% stenosis in the L common femoral and total occlusion of the L SFA. There was irregular plaque on the right that could not be quantified. 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 There is mild central AR and severe MAC with degenerative MV.   Saw Dr. Allyson Sabal, reported improved strength post TAVR, more energy. Mild claudication, no plans for invasive management at this time  Saw C walker, NP 10/30/23, reported less exertional capacity then pre-TAVR and disappointed, some orthostatic dizziness, fluctuating BPs, feeling overwhelmed with the the visits, symptoms.  Offered cardiac rehab > he declined No med changes  I saw him 11/09/23,  Doing OK,  anticipated he would feel much better then he was post TAVR (and subsequent PPM) perhaps set his expectations too high, though also reported doing activities like cleaning out a ditch. RV threshold up some, but felt stable Not dependent, conducting  2 brief PATs Planned to to follow up in a couple months  Today's visit is scheduled as a 2 mo f/u  ROS:   *** RV thresholds? *** symptoms  Device information Biotronik dual chamber PPM  implanted 07/11/23   Studies Reviewed: Marland Kitchen    EKG not done today  DEVICE interrogation done today and reviewed by myself *** Battery and lead measurements are good ***   08/13/23: TTE 1. Left ventricular ejection fraction, by estimation, is 60 to 65%. The  left ventricle has normal function. The left ventricle has no regional  wall motion abnormalities. There is mild left ventricular hypertrophy.  Left ventricular diastolic parameters  are indeterminate.   2. Device leads on RA/RV. Right ventricular systolic function is mildly  reduced. The right ventricular size is mildly enlarged.   3. Left atrial size was severely dilated.   4. The mitral valve is degenerative. Trivial mitral valve regurgitation.  No evidence of mitral stenosis. Severe mitral annular calcification.   5. 29 mm Sapien 3 valve no PVL mild central regurgitation stable low  gardients. The aortic valve has been repaired/replaced. Aortic valve  regurgitation is mild. No aortic stenosis is present.   6. The inferior vena cava is normal in size with greater than 50%  respiratory variability, suggesting right atrial pressure of 3 mmHg.   CARDIAC CATHETERIZATION 06/18/2023 Narrative   Prox LAD lesion is 50% stenosed.   1.  Mild nonobstructive coronary artery disease. 2.  Fick cardiac output of 5.1 L/min and Fick cardiac index of 2.8 L/min/m with the following hemodynamics: Right atrial pressure mean of 8 mmHg Right ventricular pressure 45/2 with an end-diastolic pressure of 12 mmHg Wedge pressure mean of 16 mmHg with V waves to 20 mmHg PA pressure of 50/18  with a mean of 30 mmHg PVR of 2.74 Woods units PA pulsatility index of 4 3.  Capacious iliofemoral vessels bilaterally.   Recommendation: Continue evaluation for aortic valve intervention.  Echo  05/31/2023:  1. Left ventricular ejection fraction, by estimation, is 45 to 50%. Left  ventricular ejection fraction by 3D volume is 48 %. The left ventricle has   mildly decreased function. The left ventricle demonstrates global  hypokinesis. There is mild concentric  left ventricular hypertrophy. Left ventricular diastolic function could  not be evaluated.   2. Right ventricular systolic function is normal. The right ventricular  size is normal.   3. Left atrial size was moderately dilated.   4. Right atrial size was severely dilated.   5. The mitral valve is degenerative. Trivial mitral valve regurgitation.  No evidence of mitral stenosis. The mean mitral valve gradient is 4.0  mmHg. Moderate to severe mitral annular calcification.   6. The aortic valve is calcified. There is moderate calcification of the  aortic valve. There is severe thickening of the aortic valve. Aortic valve  regurgitation is mild. Severe aortic valve stenosis. Aortic valve area, by  VTI measures 0.79 cm. Aortic  valve mean gradient measures 41.0 mmHg. Aortic valve Vmax measures 3.90  m/s.   7. The inferior vena cava is normal in size with greater than 50%  respiratory variability, suggesting right atrial pressure of 3 mmHg.    Carotid Dopplers  09/25/2022: Summary:  Right Carotid: Velocities in the right ICA are consistent with a 1-39% stenosis. Unable to obtain right arm pressure due to recent shoulder surgery.   Left Carotid: Velocities in the left ICA are consistent with a 40-59% stenosis.   Vertebrals: Bilateral vertebral arteries demonstrate antegrade flow.  Subclavians: Normal flow hemodynamics were seen in bilateral subclavian arteries.    Risk Assessment/Calculations:    Physical Exam:   VS:  There were no vitals taken for this visit.   Wt Readings from Last 3 Encounters:  11/14/23 172 lb (78 kg)  11/09/23 169 lb (76.7 kg)  10/30/23 170 lb (77.1 kg)    GEN: Well nourished, well developed in no acute distress NECK: No JVD; No carotid bruits CARDIAC: *** RRR, no murmurs, rubs, gallops RESPIRATORY: *** CTA b/l without rales, wheezing or rhonchi   ABDOMEN: Soft, non-tender, non-distended EXTREMITIES: *** No edema; No deformity   *** PPM site: is stable, no thinning, fluctuation, tethering, well healed  ASSESSMENT AND PLAN: .    PPM *** intact function *** no programming changes amde  VHD S/p TAVR *** Sounds like he is doing very well C/w Dr. [place]/structural teams  HTN *** Deferred to Dr. Duke Salvia     Dispo: ***  Signed, Sheilah Pigeon, PA-C

## 2023-12-25 ENCOUNTER — Ambulatory Visit: Attending: Physician Assistant | Admitting: Physician Assistant

## 2023-12-25 ENCOUNTER — Encounter: Payer: Self-pay | Admitting: Physician Assistant

## 2023-12-25 VITALS — BP 138/74 | HR 70 | Ht 67.0 in | Wt 171.2 lb

## 2023-12-25 DIAGNOSIS — I10 Essential (primary) hypertension: Secondary | ICD-10-CM

## 2023-12-25 DIAGNOSIS — Z952 Presence of prosthetic heart valve: Secondary | ICD-10-CM | POA: Diagnosis not present

## 2023-12-25 DIAGNOSIS — Z950 Presence of cardiac pacemaker: Secondary | ICD-10-CM | POA: Diagnosis not present

## 2023-12-25 NOTE — Patient Instructions (Signed)
 Medication Instructions:   Your physician recommends that you continue on your current medications as directed. Please refer to the Current Medication list given to you today.   *If you need a refill on your cardiac medications before your next appointment, please call your pharmacy*    Lab Work: NONE ORDERED  TODAY     If you have labs (blood work) drawn today and your tests are completely normal, you will receive your results only by: MyChart Message (if you have MyChart) OR A paper copy in the mail If you have any lab test that is abnormal or we need to change your treatment, we will call you to review the results.   Testing/Procedures: NONE ORDERED  TODAY      Follow-Up: At [facility], you and your health needs are our priority.  As part of our continuing mission to provide you with exceptional heart care, our providers are all part of one team.  This team includes your primary Cardiologist (physician) and Advanced Practice Providers or APPs (Physician Assistants and Nurse Practitioners) who all work together to provide you with the care you need, when you need it.  Your next appointment:    1 year(s)  Provider:   You may see Lanier Prude, MD or one of the following Advanced Practice Providers on your designated Care Team:   Francis Dowse, [place]  We recommend signing up for the patient portal called "MyChart".  Sign up information is provided on this After Visit Summary.  MyChart is used to connect with patients for Virtual Visits (Telemedicine).  Patients are able to view lab/test results, encounter notes, upcoming appointments, etc.  Non-urgent messages can be sent to your provider as well.   To learn more about what you can do with MyChart, go to [email or web address].   Other Instructions       1st Floor: - Lobby - Registration  - Pharmacy  - Lab - Cafe  2nd Floor: - PV Lab - Diagnostic Testing (echo, CT, nuclear med)  3rd Floor: -  Vacant  4th Floor: - TCTS (cardiothoracic surgery) - AFib Clinic - Structural Heart Clinic - Vascular Surgery  - Vascular Ultrasound  5th Floor: - HeartCare Cardiology (general and EP) - Clinical Pharmacy for coumadin, hypertension, lipid, weight-loss medications, and med management appointments    Valet parking services will be available as well.

## 2023-12-26 ENCOUNTER — Ambulatory Visit: Payer: Medicare PPO | Admitting: Physician Assistant

## 2023-12-26 DIAGNOSIS — H2512 Age-related nuclear cataract, left eye: Secondary | ICD-10-CM | POA: Diagnosis not present

## 2023-12-27 DIAGNOSIS — G4733 Obstructive sleep apnea (adult) (pediatric): Secondary | ICD-10-CM | POA: Diagnosis not present

## 2023-12-27 DIAGNOSIS — H2511 Age-related nuclear cataract, right eye: Secondary | ICD-10-CM | POA: Diagnosis not present

## 2023-12-31 ENCOUNTER — Other Ambulatory Visit: Payer: Self-pay | Admitting: Internal Medicine

## 2023-12-31 DIAGNOSIS — F321 Major depressive disorder, single episode, moderate: Secondary | ICD-10-CM

## 2024-01-09 DIAGNOSIS — H2511 Age-related nuclear cataract, right eye: Secondary | ICD-10-CM | POA: Diagnosis not present

## 2024-01-16 DIAGNOSIS — H353221 Exudative age-related macular degeneration, left eye, with active choroidal neovascularization: Secondary | ICD-10-CM | POA: Diagnosis not present

## 2024-01-21 ENCOUNTER — Ambulatory Visit: Payer: Medicare PPO

## 2024-01-21 DIAGNOSIS — I442 Atrioventricular block, complete: Secondary | ICD-10-CM

## 2024-01-22 LAB — CUP PACEART REMOTE DEVICE CHECK
Battery Voltage: 95
Date Time Interrogation Session: 20250505102927
Implantable Lead Connection Status: 753985
Implantable Lead Connection Status: 753985
Implantable Lead Implant Date: 20241023
Implantable Lead Implant Date: 20241023
Implantable Lead Location: 753859
Implantable Lead Location: 753860
Implantable Lead Model: 377
Implantable Lead Model: 377
Implantable Lead Serial Number: 8001570721
Implantable Lead Serial Number: 8001632583
Implantable Pulse Generator Implant Date: 20241023
Pulse Gen Serial Number: 1000330918

## 2024-01-23 ENCOUNTER — Encounter: Payer: Self-pay | Admitting: Cardiology

## 2024-01-30 ENCOUNTER — Ambulatory Visit (INDEPENDENT_AMBULATORY_CARE_PROVIDER_SITE_OTHER): Payer: Medicare PPO | Admitting: Otolaryngology

## 2024-01-30 ENCOUNTER — Encounter (INDEPENDENT_AMBULATORY_CARE_PROVIDER_SITE_OTHER): Payer: Self-pay | Admitting: Otolaryngology

## 2024-01-30 VITALS — BP 173/76 | HR 71

## 2024-01-30 DIAGNOSIS — J310 Chronic rhinitis: Secondary | ICD-10-CM

## 2024-01-30 DIAGNOSIS — R0981 Nasal congestion: Secondary | ICD-10-CM | POA: Diagnosis not present

## 2024-01-30 DIAGNOSIS — H6123 Impacted cerumen, bilateral: Secondary | ICD-10-CM | POA: Diagnosis not present

## 2024-01-30 DIAGNOSIS — H903 Sensorineural hearing loss, bilateral: Secondary | ICD-10-CM | POA: Diagnosis not present

## 2024-01-31 NOTE — Progress Notes (Signed)
 Patient ID: Brian Hunt, male   DOB: 09/13/1946, 78 y.o.   MRN: [id number]  Follow-up: Hearing loss, chronic nasal congestion  HPI: The patient is a 78 year old male who returns today for his follow-up evaluation.  The patient was previously seen for bilateral high-frequency sensorineural hearing loss and chronic nasal congestion.  He was treated with Flonase nasal sprays daily.  The patient returns today reporting no significant change in his hearing.  He continues to have chronic nasal congestion, especially during the allergy season.  He has also noted occasional postnasal drainage.  Currently he denies any otalgia, otorrhea, facial pain, or fever.  Exam: General: Communicates without difficulty, well nourished, no acute distress. Head: Normocephalic, no evidence injury, no tenderness, facial buttresses intact without stepoff. Face/sinus: No tenderness to palpation and percussion. Facial movement is normal and symmetric. Eyes: PERRL, EOMI. No scleral icterus, conjunctivae clear. Neuro: CN II exam reveals vision grossly intact.  No nystagmus at any point of gaze. Ears: Auricles well formed without lesions.  Bilateral cerumen impaction.  Nose: External evaluation reveals normal support and skin without lesions.  Dorsum is intact.  Anterior rhinoscopy reveals congested mucosa over anterior aspect of inferior turbinates and intact septum.  No purulence noted. Oral:  Oral cavity and oropharynx are intact, symmetric, without erythema or edema.  Mucosa is moist without lesions. Neck: Full range of motion without pain.  There is no significant lymphadenopathy.  No masses palpable.  Thyroid  bed within normal limits to palpation.  Parotid glands and submandibular glands equal bilaterally without mass.  Trachea is midline. Neuro:  CN 2-12 grossly intact.   Procedure: Bilateral cerumen disimpaction Anesthesia: None Description: Under the operating microscope, the cerumen is carefully removed with a combination  of cerumen currette, alligator forceps, and suction catheters.  After the cerumen is removed, the TMs are noted to be normal.  No mass, erythema, or lesions. The patient tolerated the procedure well.    Assessment: 1.  Recurrent cerumen impaction. 2.  Both tympanic membranes and middle ear spaces are normal. 3.  Chronic rhinitis with nasal mucosal congestion. 4.  Subjectively stable bilateral high-frequency sensorineural hearing loss.  Plan: 1.  Otomicroscopy with bilateral cerumen disimpaction. 2.  The physical exam findings are reviewed with the patient. 3.  The patient is a candidate for hearing amplification. 4.  Continue the use of Flonase daily.  The importance of consistent daily use is discussed. 5.  The patient will return for reevaluation in 6 months.

## 2024-02-02 ENCOUNTER — Other Ambulatory Visit: Payer: Self-pay | Admitting: Cardiovascular Disease

## 2024-02-19 ENCOUNTER — Ambulatory Visit (INDEPENDENT_AMBULATORY_CARE_PROVIDER_SITE_OTHER)

## 2024-02-19 ENCOUNTER — Ambulatory Visit: Admitting: Internal Medicine

## 2024-02-19 ENCOUNTER — Encounter: Payer: Self-pay | Admitting: Internal Medicine

## 2024-02-19 ENCOUNTER — Other Ambulatory Visit (HOSPITAL_COMMUNITY): Payer: Self-pay

## 2024-02-19 ENCOUNTER — Ambulatory Visit: Payer: Self-pay | Admitting: Internal Medicine

## 2024-02-19 ENCOUNTER — Telehealth: Payer: Self-pay | Admitting: Pharmacy Technician

## 2024-02-19 VITALS — BP 130/66 | HR 70 | Temp 97.6°F | Ht 67.0 in | Wt 171.4 lb

## 2024-02-19 DIAGNOSIS — I10 Essential (primary) hypertension: Secondary | ICD-10-CM | POA: Diagnosis not present

## 2024-02-19 DIAGNOSIS — K5904 Chronic idiopathic constipation: Secondary | ICD-10-CM

## 2024-02-19 DIAGNOSIS — E538 Deficiency of other specified B group vitamins: Secondary | ICD-10-CM

## 2024-02-19 DIAGNOSIS — R109 Unspecified abdominal pain: Secondary | ICD-10-CM | POA: Diagnosis not present

## 2024-02-19 DIAGNOSIS — E222 Syndrome of inappropriate secretion of antidiuretic hormone: Secondary | ICD-10-CM

## 2024-02-19 DIAGNOSIS — R1084 Generalized abdominal pain: Secondary | ICD-10-CM | POA: Insufficient documentation

## 2024-02-19 DIAGNOSIS — L2084 Intrinsic (allergic) eczema: Secondary | ICD-10-CM | POA: Diagnosis not present

## 2024-02-19 LAB — BASIC METABOLIC PANEL WITH GFR
BUN: 14 mg/dL (ref 6–23)
CO2: 27 meq/L (ref 19–32)
Calcium: 9.1 mg/dL (ref 8.4–10.5)
Chloride: 90 meq/L — ABNORMAL LOW (ref 96–112)
Creatinine, Ser: 0.99 mg/dL (ref 0.40–1.50)
GFR: 73.4 mL/min (ref >60.00)
Glucose, Bld: 93 mg/dL (ref 70–99)
Potassium: 4.1 meq/L (ref 3.5–5.1)
Sodium: 124 meq/L — ABNORMAL LOW (ref 135–145)

## 2024-02-19 LAB — CBC WITH DIFFERENTIAL/PLATELET
Basophils Absolute: 0 10*3/uL (ref 0.0–0.1)
Basophils Relative: 0.7 % (ref 0.0–3.0)
Eosinophils Absolute: 0.2 10*3/uL (ref 0.0–0.7)
Eosinophils Relative: 3.1 % (ref 0.0–5.0)
HCT: 38.6 % — ABNORMAL LOW (ref 39.0–52.0)
Hemoglobin: 13.3 g/dL (ref 13.0–17.0)
Lymphocytes Relative: 17 % (ref 12.0–46.0)
Lymphs Abs: 1 10*3/uL (ref 0.7–4.0)
MCHC: 34.4 g/dL (ref 30.0–36.0)
MCV: 96.9 fl (ref 78.0–100.0)
Monocytes Absolute: 1.1 10*3/uL — ABNORMAL HIGH (ref 0.1–1.0)
Monocytes Relative: 17.9 % — ABNORMAL HIGH (ref 3.0–12.0)
Neutro Abs: 3.7 10*3/uL (ref 1.4–7.7)
Neutrophils Relative %: 61.3 % (ref 43.0–77.0)
Platelets: 268 10*3/uL (ref 150.0–400.0)
RBC: 3.98 Mil/uL — ABNORMAL LOW (ref 4.22–5.81)
RDW: 13.8 % (ref 11.5–15.5)
WBC: 6 10*3/uL (ref 4.0–10.5)

## 2024-02-19 LAB — LIPASE: Lipase: 21 U/L (ref 11.0–59.0)

## 2024-02-19 LAB — MAGNESIUM: Magnesium: 2 mg/dL (ref 1.5–2.5)

## 2024-02-19 MED ORDER — TRULANCE 3 MG PO TABS
1.0000 | ORAL_TABLET | Freq: Every day | ORAL | 1 refills | Status: AC
Start: 2024-02-19 — End: ?

## 2024-02-19 MED ORDER — FLUOCINONIDE EMULSIFIED BASE 0.05 % EX CREA: 1.0000 | TOPICAL_CREAM | Freq: Two times a day (BID) | CUTANEOUS | 1 refills | Status: DC

## 2024-02-19 NOTE — Telephone Encounter (Signed)
 Pharmacy Patient Advocate Encounter   Received notification from CoverMyMeds that prior authorization for Trulance 3MG  tablets is required/requested.   Insurance verification completed.   The patient is insured through [employer] .   Per test claim: PA required; PA submitted to above mentioned insurance via CoverMyMeds Key/confirmation #/EOC [id number] Status is pending

## 2024-02-19 NOTE — Patient Instructions (Signed)

## 2024-02-19 NOTE — Progress Notes (Signed)
 Subjective:  Patient ID: Brian Hunt, male    DOB: 1945-10-16  Age: 78 y.o. MRN: [id number]  CC: Constipation (Stomach pain. 8 days. Most recent Bowel movement was yesterday morning it was very loose and runny. )   HPI Brian Hunt presents for f/up ----  Discussed the use of AI scribe software for clinical note transcription with the patient, who gave verbal consent to proceed.  History of Present Illness   Brian Hunt is a 78 year old male who presents with an eight-day history of abdominal pain and constipation.  He has experienced an eight-day history of abdominal pain and constipation. Initially, he had hard bowel movements that transitioned to pencil-sized stools, with minimal moisture requiring little wiping. Approximately eight days ago, he became constipated and unable to pass stool, accompanied by pain and bloating. The abdominal pain is described as a constant aching sensation located throughout the stomach area. No stabbing or burning pain. He has some nausea but no vomiting. After eating, he feels bloated and has gained a small amount of weight, which he attributes to being sedentary following cataract surgery. He also experiences sleepiness after meals.  His last bowel movement was the previous day, facilitated by herbal tea. He has not used other laxatives recently, although he has tried dulcolax in the past, which caused significant discomfort. He finds herbal tea effective but limits its use to twice.  No urinary symptoms such as painful, bloody, or frequent urination. He is concerned about possible dehydration and mentions a history of intermittent anemia.  He reports a rash that appeared around a month ago, possibly from an insect bite, but denies finding a tick. He experiences occasional warmth but no persistent fevers, chills, or headaches.       Outpatient Medications Prior to Visit  Medication Sig Dispense Refill   amoxicillin  (AMOXIL ) 500 MG tablet  Take 4 tablets (2000 mg) by mouth one hour prior to dental procedure. 4 tablet 3   aspirin  EC 81 MG tablet Take 81 mg by mouth daily.     cilostazol  (PLETAL ) 50 MG tablet TAKE 1 TABLET(50 MG) BY MOUTH TWICE DAILY 180 tablet 2   famotidine (PEPCID) 20 MG tablet Take 20 mg by mouth daily as needed for heartburn or indigestion.     finasteride (PROSCAR) 5 MG tablet Take 5 mg by mouth daily.     fluticasone (FLONASE) 50 MCG/ACT nasal spray Place 2 sprays into both nostrils daily.     hydrocortisone cream 1 % Apply 1 Application topically daily as needed for itching.     metoprolol  succinate (TOPROL -XL) 25 MG 24 hr tablet Take 1 tablet (25 mg total) by mouth daily. Take with or immediately following a meal. 90 tablet 3   Multiple Vitamins-Minerals (MULTIVITAMIN WITH MINERALS) tablet Take 1 tablet by mouth daily.     Propylene Glycol (SYSTANE BALANCE) 0.6 % SOLN Place 1 drop into both eyes as needed (dry eyes).     rosuvastatin  (CRESTOR ) 5 MG tablet TAKE 1 TABLET EVERY DAY 90 tablet 0   tamsulosin  (FLOMAX ) 0.4 MG CAPS capsule Take 0.4 mg by mouth at bedtime.     telmisartan  (MICARDIS ) 80 MG tablet Take 1 tablet (80 mg total) by mouth daily. 90 tablet 3   Tiotropium Bromide-Olodaterol (STIOLTO RESPIMAT ) 2.5-2.5 MCG/ACT AERS Inhale 1 puff into the lungs daily.     venlafaxine  XR (EFFEXOR -XR) 37.5 MG 24 hr capsule TAKE 1 CAPSULE(37.5 MG) BY MOUTH DAILY WITH  BREAKFAST. Needs appointment for refills 30 capsule 0   No facility-administered medications prior to visit.    ROS Review of Systems  Constitutional: Negative.  Negative for appetite change, chills, diaphoresis, fatigue and fever.  HENT: Negative.    Respiratory: Negative.  Negative for cough, chest tightness, shortness of breath and wheezing.   Cardiovascular:  Negative for chest pain, palpitations and leg swelling.  Gastrointestinal:  Positive for abdominal pain, constipation and nausea. Negative for abdominal distention, anal bleeding, blood  in stool, diarrhea, rectal pain and vomiting.  Endocrine: Negative for polyuria.  Genitourinary: Negative.  Negative for difficulty urinating and dysuria.  Musculoskeletal:  Negative for arthralgias, back pain, joint swelling and myalgias.  Skin:  Positive for rash. Negative for color change.  Neurological:  Negative for dizziness and weakness.  Hematological:  Negative for adenopathy. Does not bruise/bleed easily.  Psychiatric/Behavioral: Negative.      Objective:  BP 130/66 (BP Location: Left Arm, Patient Position: Sitting, Cuff Size: Normal)   Pulse 70   Temp 97.6 F (36.4 C) (Oral)   Ht 5\' 7"  (1.702 m)   Wt 171 lb 6.4 oz (77.7 kg)   SpO2 95%   BMI 26.85 kg/m   BP Readings from Last 3 Encounters:  02/19/24 130/66  01/30/24 (!) 173/76  12/25/23 138/74    Wt Readings from Last 3 Encounters:  02/19/24 171 lb 6.4 oz (77.7 kg)  12/25/23 171 lb 3.2 oz (77.7 kg)  11/14/23 172 lb (78 kg)    Physical Exam Vitals reviewed.  Constitutional:      Appearance: Normal appearance.  HENT:     Nose: Nose normal.     Mouth/Throat:     Mouth: Mucous membranes are moist.  Eyes:     General: No scleral icterus.    Conjunctiva/sclera: Conjunctivae normal.  Cardiovascular:     Rate and Rhythm: Normal rate and regular rhythm.     Heart sounds: No murmur heard.    No friction rub. No gallop.  Pulmonary:     Effort: Pulmonary effort is normal.     Breath sounds: No stridor. No wheezing, rhonchi or rales.  Abdominal:     General: Abdomen is flat. Bowel sounds are normal. There is no distension.     Palpations: There is no hepatomegaly, splenomegaly or mass.     Tenderness: There is no abdominal tenderness. There is no guarding or rebound.     Hernia: No hernia is present.  Genitourinary:    Prostate: Enlarged. Not tender and no nodules present.     Rectum: Guaiac result negative. External hemorrhoid and internal hemorrhoid present. No mass, tenderness or anal fissure. Normal anal  tone.     Comments: He has uncomplicated E and I anal hemorrhoids Musculoskeletal:        General: Normal range of motion.     Cervical back: Neck supple.     Right lower leg: No edema.     Left lower leg: No edema.  Lymphadenopathy:     Cervical: No cervical adenopathy.  Skin:    General: Skin is warm and dry.  Neurological:     General: No focal deficit present.     Mental Status: He is alert and oriented to person, place, and time. Mental status is at baseline.  Psychiatric:        Mood and Affect: Mood normal.        Behavior: Behavior normal.     Lab Results  Component Value Date   WBC  6.0 02/19/2024   HGB 13.3 02/19/2024   HCT 38.6 (L) 02/19/2024   PLT 268.0 02/19/2024   GLUCOSE 93 02/19/2024   CHOL 112 04/17/2023   TRIG 43.0 04/17/2023   HDL 67.20 04/17/2023   LDLCALC 36 04/17/2023   ALT 18 07/09/2023   AST 24 07/09/2023   NA 124 (L) 02/19/2024   K 4.1 02/19/2024   CL 90 (L) 02/19/2024   CREATININE 0.99 02/19/2024   BUN 14 02/19/2024   CO2 27 02/19/2024   TSH 1.90 02/19/2024   PSA 3.90 09/13/2023   INR 1.1 07/09/2023   HGBA1C 5.7 04/17/2023    VAS US  LOWER EXTREMITY ARTERIAL DUPLEX Result Date: 10/05/2023 LOWER EXTREMITY ARTERIAL DUPLEX STUDY Patient Name:  LAVONTAE CORNIA  Date of Exam:   10/02/2023 Medical Rec #: [id number]       Accession #:    [id number] Date of Birth: October 15, 1945       Patient Gender: M Patient Age:   80 years Exam Location:  [facility] Procedure:      VAS US  LOWER EXTREMITY ARTERIAL DUPLEX Referring Phys: JONATHAN BERRY --------------------------------------------------------------------------------  Indications: Peripheral artery disease, and patient reports intermittent              discomfort in both legs after walking 75-100 yards, but he states              the pain is not as it was prior to medical therapy. He denies any              rest pain. High Risk         Hypertension, hyperlipidemia, past history of smoking, prior Factors:           CVA. Other Factors: COPD.  Current ABI: .66 on the right and .65 on the left Comparison Study: In 06/2021, a lower arterial duplex showed no evidence of                   focal stenosis in the right lower extremity, although there                   were areas of shadowing plaque and high grade stenosis or                   occlusion could not be ruled out. On the left, there was                   30-49% stenosis in the distal CFA, 50-74% stenosis at the                   ostial SFA, low end range and occluded mid SFA. Performing Technologist: Doren Gammons RVT  Examination Guidelines: A complete evaluation includes B-mode imaging, spectral Doppler, color Doppler, and power Doppler as needed of all accessible portions of each vessel. Bilateral testing is considered an integral part of a complete examination. Limited examinations for reoccurring indications may be performed as noted.  +-----------+--------+-----+--------+----------------------+-------------------+ RIGHT      PSV cm/sRatioStenosisWaveform              Comments            +-----------+--------+-----+--------+----------------------+-------------------+ CFA Prox   96                   biphasic                                  +-----------+--------+-----+--------+----------------------+-------------------+  CFA Mid    118                  biphasic                                  +-----------+--------+-----+--------+----------------------+-------------------+ CFA Distal 105                  biphasic                                  +-----------+--------+-----+--------+----------------------+-------------------+ SFA Prox   72                   biphasic                                  +-----------+--------+-----+--------+----------------------+-------------------+ SFA Mid    44                   biphasic to monophasicheavily calcified                                                         with shadowing,                                                            cannot exclude high                                                       grade stenosis vs                                                         occlusion, possible                                                       collateralization                                                         just distally       +-----------+--------+-----+--------+----------------------+-------------------+ SFA Distal 50                   monophasic                                +-----------+--------+-----+--------+----------------------+-------------------+  POP Prox   42                   biphasic to monophasic                    +-----------+--------+-----+--------+----------------------+-------------------+ POP Mid    54                   biphasic                                  +-----------+--------+-----+--------+----------------------+-------------------+ POP Distal 62                   monophasic                                +-----------+--------+-----+--------+----------------------+-------------------+ TP Trunk   31                   biphasic                                  +-----------+--------+-----+--------+----------------------+-------------------+ ATA Distal 33                   biphasic to monophasic                    +-----------+--------+-----+--------+----------------------+-------------------+ PTA Distal 27                   monophasic                                +-----------+--------+-----+--------+----------------------+-------------------+ PERO Distal14                   biphasic                                  +-----------+--------+-----+--------+----------------------+-------------------+  +-----------+--------+-----+---------------+-----------+-----------------------+ LEFT       PSV cm/sRatioStenosis       Waveform   Comments                 +-----------+--------+-----+---------------+-----------+-----------------------+ CFA Prox   133                         multiphasic                        +-----------+--------+-----+---------------+-----------+-----------------------+ CFA Mid    119                         triphasic                          +-----------+--------+-----+---------------+-----------+-----------------------+ CFA Distal 191          30-49% stenosisbiphasic   high end range          +-----------+--------+-----+---------------+-----------+-----------------------+ SFA Prox   69                          biphasic   ostium, heavily  calcified with                                                            shadowing, cannot                                                         exclude high grade                                                        stenosis vs occlusion                                                     distal to ostium        +-----------+--------+-----+---------------+-----------+-----------------------+ SFA Mid    106                         monophasic heavily calcified with                                                    shadowing, cannot                                                         exclude high grade                                                        stenosis vs occlusion,                                                    possible                                                                  collateralization just  distally                +-----------+--------+-----+---------------+-----------+-----------------------+ SFA Distal 23                          monophasic                         +-----------+--------+-----+---------------+-----------+-----------------------+ POP  Prox   26                          monophasic                         +-----------+--------+-----+---------------+-----------+-----------------------+ POP Mid    26                          biphasic                           +-----------+--------+-----+---------------+-----------+-----------------------+ POP Distal 35                          biphasic                           +-----------+--------+-----+---------------+-----------+-----------------------+ ATA Distal 12                          monophasic                         +-----------+--------+-----+---------------+-----------+-----------------------+ PTA Distal 23                          monophasic                         +-----------+--------+-----+---------------+-----------+-----------------------+ PERO Distal11                          monophasic                         +-----------+--------+-----+---------------+-----------+-----------------------+  Summary: Right: No significant change compared to previous study. Extensive bulky calcified plaque with shadowing throughout with low flow velocities. Bulky calcifications with shadowing in the mid SFA; cannot exclude high grade stenosis vs occlusion. Left: No significant change as compared to previous study. Extensive bulky calcified plaque with shadowing throughout with low flow velocities. Essentially stable 30-49% stenosis in the distal CFA, high end range. Bulky calcifications with shadowing in the proximal SFA distal to ostium and mid SFA; cannot exclude high grade stenosis vs occlusion.  See table(s) above for measurements and observations. See Aortoiliac duplex and ABI reports. Electronically signed by Antionette Kirks MD on 10/05/2023 at 1:29:14 PM.    Final    VAS US  AORTA/IVC/ILIACS Result Date: 10/05/2023 ABDOMINAL AORTA STUDY Patient Name:  HAL NORRINGTON  Date of Exam:   10/02/2023 Medical Rec #: [id number]       Accession #:    [id number] Date of Birth:  03-30-1946       Patient Gender: M Patient Age:   38 years Exam Location:  [facility] Procedure:      VAS US  AORTA/IVC/ILIACS Referring Phys: JONATHAN BERRY --------------------------------------------------------------------------------  Indications: Peripheral arterial disease. Patient reports intermittent              discomfort in both legs after walking 75-100 yards, but he states              the pain is not as it was prior to medical therapy. He denies any              rest pain.               Today's ABIs are .66 on the right and .65 on the left. Risk Factors: Hypertension, hyperlipidemia, past history of smoking, prior CVA. Other Factors: COPD. Limitations: Air/bowel gas and flash artifact.  Comparison Study: In 07/2021, an aortoiliac duplex showed no evidence of focal                   stenosis in the bilateral common and external iliac arteries. Performing Technologist: Doren Gammons RVT  Examination Guidelines: A complete evaluation includes B-mode imaging, spectral Doppler, color Doppler, and power Doppler as needed of all accessible portions of each vessel. Bilateral testing is considered an integral part of a complete examination. Limited examinations for reoccurring indications may be performed as noted.  Abdominal Aorta Findings: +-------------+-------+----------+----------+--------+--------+--------------+ Location     AP (cm)Trans (cm)PSV (cm/s)WaveformThrombusComments       +-------------+-------+----------+----------+--------+--------+--------------+ Proximal                                                not visualized +-------------+-------+----------+----------+--------+--------+--------------+ Mid                           70        biphasic                       +-------------+-------+----------+----------+--------+--------+--------------+ Distal                        79        biphasic                        +-------------+-------+----------+----------+--------+--------+--------------+ RT CIA Prox                   126       biphasic                       +-------------+-------+----------+----------+--------+--------+--------------+ RT CIA Mid                    109       biphasic                       +-------------+-------+----------+----------+--------+--------+--------------+ RT CIA Distal                 127       biphasic                       +-------------+-------+----------+----------+--------+--------+--------------+ RT EIA Prox                   160       biphasic                       +-------------+-------+----------+----------+--------+--------+--------------+  RT EIA Mid                    112       biphasic                       +-------------+-------+----------+----------+--------+--------+--------------+ RT EIA Distal                 102       biphasic                       +-------------+-------+----------+----------+--------+--------+--------------+ LT CIA Prox                                             not visualized +-------------+-------+----------+----------+--------+--------+--------------+ LT CIA Mid                                              not visualized +-------------+-------+----------+----------+--------+--------+--------------+ LT CIA Distal                                           not visualized +-------------+-------+----------+----------+--------+--------+--------------+ LT EIA Prox                   122       biphasic                       +-------------+-------+----------+----------+--------+--------+--------------+ LT EIA Mid                    148       biphasic                       +-------------+-------+----------+----------+--------+--------+--------------+ LT EIA Distal                 112       biphasic                        +-------------+-------+----------+----------+--------+--------+--------------+ Visualization of the Left CIA Proximal artery, Left CIA Mid artery and Left CIA Distal artery was limited. IVC/Iliac Findings: +--------+------+--------+--------+   IVC   PatentThrombusComments +--------+------+--------+--------+ IVC Proxpatent                 +--------+------+--------+--------+   Summary: Stenosis: Widely patent right common and bilateral external iliac arteries without evidence of focal stenosis. The entire left common iliac artery is obscured by overlying bowel gas. IVC/Iliac: Patent IVC.  *See table(s) above for measurements and observations.  Electronically signed by Antionette Kirks MD on 10/05/2023 at 8:20:36 AM. See LE Arterial duplex and ABI reports.    Final    VAS US  CAROTID Result Date: 10/04/2023 Carotid Arterial Duplex Study Patient Name:  ORHAN MAYORGA  Date of Exam:   10/02/2023 Medical Rec #: [id number]       Accession #:    [id number] Date of Birth: August 24, 1946       Patient Gender: M Patient Age:   45 years Exam Location:  [facility] Procedure:      VAS US  CAROTID Referring Phys: JONATHAN BERRY --------------------------------------------------------------------------------  Indications:  Carotid artery disease and patient reports occasional                    headaches, blurred vision in both eyes, with h/o cataracts                    and macular degeneration, and intermittent numbness in both                    hands, left worse than right. He denies any other                    cerebrovascular symptoms. Risk Factors:      Hypertension, hyperlipidemia, past history of smoking, prior                    CVA, PAD. Other Factors:     COPD. Comparison Study:  In 09/2022, a carotid duplex showed velocities of 134/29 cm/s                    in the RICA and 182/46 cm/s in the LICA. Performing Technologist: Doren Gammons RVT  Examination Guidelines: A complete evaluation includes B-mode imaging,  spectral Doppler, color Doppler, and power Doppler as needed of all accessible portions of each vessel. Bilateral testing is considered an integral part of a complete examination. Limited examinations for reoccurring indications may be performed as noted.  Right Carotid Findings: +----------+--------+--------+--------+------------------+---------+           PSV cm/sEDV cm/sStenosisPlaque DescriptionComments  +----------+--------+--------+--------+------------------+---------+ CCA Prox  55      7                                           +----------+--------+--------+--------+------------------+---------+ CCA Distal54      13      <50%    heterogenous                +----------+--------+--------+--------+------------------+---------+ ICA Prox  79      26              calcific          Shadowing +----------+--------+--------+--------+------------------+---------+ ICA Mid   108     33      1-39%   calcific          Shadowing +----------+--------+--------+--------+------------------+---------+ ICA Distal98      35                                          +----------+--------+--------+--------+------------------+---------+ ECA       142     22      >50%    heterogenous                +----------+--------+--------+--------+------------------+---------+ +----------+--------+-------+----------------+-------------------+           PSV cm/sEDV cmsDescribe        Arm Pressure (mmHG) +----------+--------+-------+----------------+-------------------+ [id number]            Multiphasic, [id number]                 +----------+--------+-------+----------------+-------------------+ +---------+--------+--+--------+--+---------+ VertebralPSV cm/s29EDV cm/s10Antegrade +---------+--------+--+--------+--+---------+  Left Carotid Findings: +----------+--------+--------+--------+------------------+---------+           PSV cm/sEDV cm/sStenosisPlaque DescriptionComments   +----------+--------+--------+--------+------------------+---------+ CCA Prox  82  12                                          +----------+--------+--------+--------+------------------+---------+ CCA Distal75      16      <50%    heterogenous                +----------+--------+--------+--------+------------------+---------+ ICA Prox  227     66              calcific          Shadowing +----------+--------+--------+--------+------------------+---------+ ICA Mid   245     68      60-79%  calcific          Shadowing +----------+--------+--------+--------+------------------+---------+ ICA Distal73      23                                          +----------+--------+--------+--------+------------------+---------+ ECA       108     14              heterogenous                +----------+--------+--------+--------+------------------+---------+ +----------+--------+--------+----------------+-------------------+           PSV cm/sEDV cm/sDescribe        Arm Pressure (mmHG) +----------+--------+--------+----------------+-------------------+ [id number]             Multiphasic, [id number]                 +----------+--------+--------+----------------+-------------------+ +---------+--------+--+--------+--+---------+ VertebralPSV cm/s53EDV cm/s14Antegrade +---------+--------+--+--------+--+---------+   Summary: Right Carotid: Velocities in the right ICA are consistent with a 1-39% stenosis.                Non-hemodynamically significant plaque <50% noted in the CCA. The                ECA appears >50% stenosed. Left Carotid: Velocities in the left ICA are consistent with a 60-79% stenosis.               Non-hemodynamically significant plaque <50% noted in the CCA. Vertebrals:  Bilateral vertebral arteries demonstrate antegrade flow. Subclavians: Normal flow hemodynamics were seen in bilateral subclavian              arteries. *See table(s) above for measurements  and observations. Suggest follow up study in 12 months. Electronically signed by Antionette Kirks MD on 10/04/2023 at 3:40:29 PM.    Final    VAS US  ABI WITH/WO TBI Result Date: 10/04/2023  LOWER EXTREMITY DOPPLER STUDY Patient Name:  THOMES BURAK  Date of Exam:   10/02/2023 Medical Rec #: [id number]       Accession #:    [id number] Date of Birth: 1945-10-03       Patient Gender: M Patient Age:   2 years Exam Location:  [facility] Procedure:      VAS US  ABI WITH/WO TBI Referring Phys: Arlyce Lambert BERRY --------------------------------------------------------------------------------  Indications: Peripheral artery disease. Patient reports intermittent discomfort              in both legs after walking 75-100 yards, but he states the pain is              not as it was prior to medical therapy. He denies any rest pain. High Risk  Hypertension, hyperlipidemia, past history of smoking, prior Factors:          CVA. Other Factors: COPD.  Comparison Study: In 06/2021, a lower arterial Doppler showed an ABI of .72 on                   the right and .66 on the left. Performing Technologist: Doren Gammons RVT  Examination Guidelines: A complete evaluation includes at minimum, Doppler waveform signals and systolic blood pressure reading at the level of bilateral brachial, anterior tibial, and posterior tibial arteries, when vessel segments are accessible. Bilateral testing is considered an integral part of a complete examination. Photoelectric Plethysmograph (PPG) waveforms and toe systolic pressure readings are included as required and additional duplex testing as needed. Limited examinations for reoccurring indications may be performed as noted.  ABI Findings: +---------+------------------+-----+-----------+--------+ Right    Rt Pressure (mmHg)IndexWaveform   Comment  +---------+------------------+-----+-----------+--------+ Brachial 184                                         +---------+------------------+-----+-----------+--------+ PTA      121               0.66 multiphasic         +---------+------------------+-----+-----------+--------+ DP       99                0.54 multiphasic         +---------+------------------+-----+-----------+--------+ Great Toe99                0.54 Abnormal            +---------+------------------+-----+-----------+--------+ +---------+------------------+-----+-----------+-------+ Left     Lt Pressure (mmHg)IndexWaveform   Comment +---------+------------------+-----+-----------+-------+ Brachial 177                                       +---------+------------------+-----+-----------+-------+ PTA      117               0.64 multiphasic        +---------+------------------+-----+-----------+-------+ DP       119               0.65 monophasic         +---------+------------------+-----+-----------+-------+ Great Toe59                0.32 Abnormal           +---------+------------------+-----+-----------+-------+ +-------+-----------+-----------+------------+------------+ ABI/TBIToday's ABIToday's TBIPrevious ABIPrevious TBI +-------+-----------+-----------+------------+------------+ Right  .66        .54        .72         .43          +-------+-----------+-----------+------------+------------+ Left   .65        .32        .66         .34          +-------+-----------+-----------+------------+------------+  Bilateral ABIs and TBIs appear essentially unchanged compared to prior study on 06/30/2021.  Summary: Right: Resting right ankle-brachial index indicates moderate right lower extremity arterial disease. The right toe-brachial index is abnormal. Left: Resting left ankle-brachial index indicates moderate left lower extremity arterial disease. The left toe-brachial index is abnormal. *See table(s) above for measurements and observations. See Aortoiliac and LE Arterial duplex reports. Suggest  follow up study in  12 months. Electronically signed by Antionette Kirks MD on 10/04/2023 at 3:31:56 PM.    Final    DG ABD ACUTE 2+V W 1V CHEST Result Date: 02/19/2024 CLINICAL DATA:  Abdominal pain constipation. EXAM: DG ABDOMEN ACUTE WITH 1 VIEW CHEST COMPARISON:  September 13, 2023. FINDINGS: There is no evidence of dilated bowel loops or free intraperitoneal air. Mild amount of stool seen throughout colon. No radiopaque calculi or other significant radiographic abnormality is seen. Heart size and mediastinal contours are within normal limits. Both lungs are clear. IMPRESSION: No abnormal bowel dilatation. Mild stool burden. No acute cardiopulmonary disease. Electronically Signed   By: Rosalene Colon M.D.   On: 02/19/2024 12:10   CUP PACEART REMOTE DEVICE CHECK Result Date: 01/22/2024 PPM Scheduled remote reviewed. Normal device function.  Presenting rhythm:  AP/VS 1 HVR, EGM c/w atrial driven 1:1, brief, HR 180's Next remote 91 days. LA, CVRS  NCV with EMG(electromyography) Result Date: 12/10/2023 Ellene Gustin, MD     12/10/2023  3:22 PM [facility] Neurology [address] [facility], [address]  [place], [place] [address] Tel: [phone] Fax: [phone] Test Date:  12/10/2023 Patient: Andrei Mccook DOB: 01/13/1946 Physician: Rommie Coats, MD Sex: Male Height: 5\' 7"  Ref Phys: Rommie Coats, MD ID#: [id number]   Technician:  History: This is a 78 year old male with pain and weakness in his hands. NCV & EMG Findings: Extensive electrodiagnostic evaluation of bilateral upper limbs shows: Left median sensory response is absent. Right median sensory response shows prolonged distal peak latency (5.3 ms) and reduced amplitude (6 V). Bilateral ulnar and radial sensory responses are within normal limits. Bilateral median (APB) motor responses show prolonged distal onset latency (L6.6, R5.5 ms) and reduced amplitude (L3.7, R3.0 mV). Bilateral ulnar (ADM) motor responses are within normal limits. Chronic motor axon loss  changes without accompanying active denervation changes are seen in bilateral abductor pollicis brevis, left first dorsal interosseous, left extensor indicis proprius, and left cervical paraspinal (C7 level) muscles. Neuromuscular Ultrasound Findings: High frequency (4.0-16.0 MHz) B-mode, nonvascular ultrasound of the left upper limb shows: Cross sectional areas (CSA) of the left median (palm to forearm) nerve are within normal limits. Wrist to forearm ratios of the left median nerve is increased (2.10). No other obvious lesion involving the adjacent bone or tendon is identified. No definite vascular abnormalities. Impression: This is an abnormal study. The findings are most consistent with the following: Evidence of bilateral median mononeuropathy at or distal to the wrist, consistent with carpal tunnel syndrome. The findings are severe in degree electrically on the left and moderate in degree electrically on the right. The residuals of an old intraspinal canal lesion (ie: motor radiculopathy) at the left C8 root or segment, mild in degree electrically. No electrodiagnostic evidence of a right cervical (C5-T1) motor radiculopathy. ___________________________ Rommie Coats, MD NCS+ Motor Nerve Results   Latency Amplitude F-Lat Segment Distance CV Comment Site (ms) Norm (mV) Norm (ms)  (cm) (m/s) Norm  Left Median (APB) Motor Wrist *6.6  < 4.0 *3.7  > 5.0      Could not eliminate dip, moved x3 Elbow 11.1 - 3.5 -  Elbow-Wrist 31 69  > 50  Right Median (APB) Motor Wrist *5.5  < 4.0 *3.0  > 5.0       Elbow 11.2 - 2.1 -  Elbow-Wrist 28.5 50  > 50  Left Ulnar (ADM) Motor Wrist 2.5  < 3.1 13.1  > 7.0       Bel  elbow 6.6 - 10.4 -  Bel elbow-Wrist 21 51  > 50  Ab elbow 8.6 - 9.4 -  Ab elbow-Bel elbow 10 50 -  Right Ulnar (ADM) Motor Wrist 2.4  < 3.1 9.6  > 7.0       Bel elbow 5.8 - 9.1 -  Bel elbow-Wrist 22 65  > 50  Ab elbow 7.6 - 7.9 -  Ab elbow-Bel elbow 10 56 -  Sensory Sites   Neg Peak Lat Amplitude (O-P) Segment Distance  Velocity Comment Site (ms) Norm (V) Norm  (cm) (ms)  Left Median Sensory Wrist-Dig II *NR  < 3.8 *NR  > 10 Wrist-Dig II 13   Right Median Sensory Wrist-Dig II *5.3  < 3.8 *6  > 10 Wrist-Dig II 13   Left Radial Sensory Forearm-Wrist 2.3  < 2.8 13  > 10 Forearm-Wrist 10   Right Radial Sensory Forearm-Wrist 2.4  < 2.8 15  > 10 Forearm-Wrist 10   Left Ulnar Sensory Wrist-Dig V 3.1  < 3.2 16  > 5 Wrist-Dig V 11   Right Ulnar Sensory Wrist-Dig V 3.0  < 3.2 15  > 5 Wrist-Dig V 11   Nerve Measurements  Site Area Segment Area Ratio Mobility Vascularity Comment  mm Norm   Norm    Left Median Palm 5.9        Wrist 12.2  < 13.0        Forearm 5.8  < 10.7  Wrist - Forearm *2.10  < 1.50     EMG+  Side Muscle Ins.Act Fibs Fasc Recrt Amp Dur Poly Activation Comment Right FDI Nml Nml Nml Nml Nml Nml Nml Nml N/A Right EIP Nml Nml Nml Nml Nml Nml Nml Nml N/A Right Pronator teres Nml Nml Nml Nml Nml Nml Nml Nml N/A Right APB Nml Nml Nml *1- *1+ *1+ *1+ Nml N/A Right Biceps Nml Nml Nml Nml Nml Nml Nml Nml N/A Right Triceps Nml Nml Nml Nml Nml Nml Nml Nml N/A Right Deltoid Nml Nml Nml Nml Nml Nml Nml Nml N/A Left FDI Nml Nml Nml *1- *1+ *1+ Nml Nml N/A Left EIP Nml Nml Nml *1- *1+ *1+ Nml Nml N/A Left Pronator teres Nml Nml Nml Nml Nml Nml Nml Nml N/A Left APB Nml Nml Nml *2- *1+ *1+ Nml Nml N/A Left Biceps Nml Nml Nml Nml Nml Nml Nml Nml N/A Left Triceps Nml Nml Nml Nml Nml Nml Nml Nml N/A Left Deltoid Nml Nml Nml Nml Nml Nml Nml Nml N/A Left C7 PSP Nml Nml Nml *1- *1+ *1+ Nml Nml N/A Waveforms: Motor       Sensory                       Assessment & Plan:  Chronic idiopathic constipation -     Magnesium ; Future -     Basic metabolic panel with GFR; Future -     Thyroid  Panel With TSH; Future -     DG ABD ACUTE 2+V W 1V CHEST; Future -     Trulance ; Take 1 tablet (3 mg total) by mouth daily.  Dispense: 90 tablet; Refill: 1  B12 deficiency -     CBC with Differential/Platelet; Future  Primary hypertension- BP is well  controlled. -     CBC with Differential/Platelet; Future -     Basic metabolic panel with GFR; Future -     Thyroid  Panel With TSH; Future  SIADH (syndrome of inappropriate ADH  production) (HCC)- Urine Na+ is low. I have asked him to decrease his water  intake. -     Sodium, urine, random; Future -     Basic metabolic panel with GFR; Future -     Thyroid  Panel With TSH; Future -     DG ABD ACUTE 2+V W 1V CHEST; Future  Generalized abdominal pain -     Lipase; Future -     DG ABD ACUTE 2+V W 1V CHEST; Future     Follow-up: Return in about 3 months (around 05/21/2024).  Sandra Crouch, MD

## 2024-02-20 DIAGNOSIS — H353221 Exudative age-related macular degeneration, left eye, with active choroidal neovascularization: Secondary | ICD-10-CM | POA: Diagnosis not present

## 2024-02-20 NOTE — Telephone Encounter (Signed)
 Pharmacy Patient Advocate Encounter  Received notification from [employer] that Prior Authorization for Trulance 3MG  tablets has been DENIED.  Full denial letter will be uploaded to the media tab. See denial reason below.    PA #/Case ID/Reference #: [id number]

## 2024-02-21 LAB — THYROID PANEL WITH TSH
Free Thyroxine Index: 2.4 (ref 1.4–3.8)
T3 Uptake: 38 % — ABNORMAL HIGH (ref 22–35)
T4, Total: 6.2 ug/dL (ref 4.9–10.5)
TSH: 1.9 m[IU]/L (ref 0.40–4.50)

## 2024-02-21 LAB — SODIUM, URINE, RANDOM: Sodium, Ur: 25 mmol/L — ABNORMAL LOW (ref 28–272)

## 2024-03-04 ENCOUNTER — Encounter (HOSPITAL_BASED_OUTPATIENT_CLINIC_OR_DEPARTMENT_OTHER): Payer: Self-pay | Admitting: Cardiovascular Disease

## 2024-03-04 ENCOUNTER — Encounter (HOSPITAL_BASED_OUTPATIENT_CLINIC_OR_DEPARTMENT_OTHER): Payer: Self-pay | Admitting: *Deleted

## 2024-03-04 ENCOUNTER — Ambulatory Visit (HOSPITAL_BASED_OUTPATIENT_CLINIC_OR_DEPARTMENT_OTHER): Payer: Medicare PPO | Admitting: Cardiovascular Disease

## 2024-03-04 VITALS — BP 166/80 | HR 70 | Ht 67.0 in | Wt 171.4 lb

## 2024-03-04 DIAGNOSIS — E785 Hyperlipidemia, unspecified: Secondary | ICD-10-CM | POA: Diagnosis not present

## 2024-03-04 DIAGNOSIS — I10 Essential (primary) hypertension: Secondary | ICD-10-CM | POA: Diagnosis not present

## 2024-03-04 MED ORDER — RIVAROXABAN 2.5 MG PO TABS: 2.5000 mg | ORAL_TABLET | Freq: Every day | ORAL | 5 refills | Status: DC

## 2024-03-04 MED ORDER — RIVAROXABAN 2.5 MG PO TABS: 2.5000 mg | ORAL_TABLET | Freq: Two times a day (BID) | ORAL | 5 refills | Status: DC

## 2024-03-04 MED ORDER — TELMISARTAN 80 MG PO TABS: ORAL_TABLET | ORAL | Status: DC

## 2024-03-04 NOTE — Patient Instructions (Addendum)
 Medication Instructions:  CHANGE YOUR TELMISARTAN  TO 1/2 TABLET TWICE A DAY   START XARELTO 2.5 MG TWICE A DAY   *If you need a refill on your cardiac medications before your next appointment, please call your pharmacy*  Lab Work: FASTING LIPIDS WHEN YOU HAVE LABS IN COUPLE MONTHS AT PRIMARY CARE  If you have labs (blood work) drawn today and your tests are completely normal, you will receive your results only by: MyChart Message (if you have MyChart) OR A paper copy in the mail If you have any lab test that is abnormal or we need to change your treatment, we will call you to review the results.  Testing/Procedures: NONE   Follow-Up: At [facility], you and your health needs are our priority.  As part of our continuing mission to provide you with exceptional heart care, our providers are all part of one team.  This team includes your primary Cardiologist (physician) and Advanced Practice Providers or APPs (Physician Assistants and Nurse Practitioners) who all work together to provide you with the care you need, when you need it.  Your next appointment:   6 month(s)  Provider:   Maudine Sos, MD   We recommend signing up for the patient portal called MyChart.  Sign up information is provided on this After Visit Summary.  MyChart is used to connect with patients for Virtual Visits (Telemedicine).  Patients are able to view lab/test results, encounter notes, upcoming appointments, etc.  Non-urgent messages can be sent to your provider as well.   To learn more about what you can do with MyChart, go to [email or web address].   Other Instructions MONITOR AND LOG YOUR YOUR EPISODES WHEN YOUR LEGS ARE HURTING. TAKE THE LOG TO YOUR APPOINTMENT WITH DR Kachemak   MONITOR AND LOG YOUR BLOOD PRESSURE DAILY. SEND MYCHART MESSAGE IN 2 WEEKS

## 2024-03-04 NOTE — Progress Notes (Signed)
 Cardiology Office Note:  .   Date:  03/04/2024  ID:  Brian Hunt, DOB 01-27-1946, MRN [id number] PCP: Arcadio Knuckles, MD  [facility] Providers Cardiologist:  Maudine Sos, MD Electrophysiologist:  Boyce Byes, MD  Sleep Medicine:  Gaylyn Keas, MD    History of Present Illness: .    Brian Hunt is a 78 y.o. male with a hx of OSA, severe aortic stenosis s/p TAVR, anemia, anxiety, arthritis, basal cell carcinoma, BPH, carotid artery occlusion, complication of anesthesia (vasal vagal reaction), COPD, diverticulosis, DOE, hyperlipidemia, hypertension, RBBB, OSA, here for follow-up. He saw Dr. Rochelle Chu 02/2021, and his blood pressure was 136/78. He reported that his blood pressure had been well controlled. Mild aortic stenosis was also noted and he was referred to cardiology. He previously had an echo 02/2018 with LVEF 65-70%, grade 1 diastolic dysfunction, and mild stenosis with a mean gradient of 13 mm HG.   He complained of bilateral LE pain. He had ABIs 06/2021 that revealed moderate disease bilaterally. Arterial dopplers showed 30-49% stenosis in the L common femoral and total occlusion of the L SFA. There was irregular plaque on the right that could not be quantified. Carotid dopplers showed moderate stenosis in the bilateral ICAs. Echo 06/2021 revealed LVEF 60-65% and mild aortic stenosis. He was referred to Dr. Katheryne Pane and was started on Pletal  07/2021. They made plans to re-evaluate for angiography in 3 months. He stopped smoking 06/2021.    In 09/2021, his blood pressure continued to be labile, but it averaged in the 130s/70s and was controlled in the office. He continued exercising regularly. He continues to follow up with Dr. Katheryne Pane for PAD. When he last saw him 04/2022 he was doing well and he had some mild claudication. They discussed peripheral angiography and he wanted to think about it.   At his visit 09/2022, his blood pressure was high in the office but consistently well  controlled at home. He expressed concerns with insomnia, shortness of breath/orthopnea since his shoulder replacement surgery 6 weeks prior. He also had poor air movement on exam and was referred to pulmonary. He had an echo 09/2022 showing moderate to severe aortic stenosis (was mild in 2022). He completed an [place]amar sleep study revealing moderate obstructive sleep apnea and was recommended for CPAP. Repeat echo 05/2023 showed LVEF 45-50%, left ventricular global hypokinesis, mild LVH, and severe aortic stenosis.  He was referred for TAVR and had a 29mm Rolon Ultra THV implanted 06/2023.  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  Post-procedure echo revealed LVEF 60-65% with a well-functioning TAVR.  He saw Neomi Banks, NP 10/2023 and noted intermitted dizziness upon standing.    Discussed the use of AI scribe software for clinical note transcription with the patient, who gave verbal consent to proceed.  History of Present Illness [name] experiences elevated blood pressure readings in the mornings, typically around 160s, which decrease to the 110s by the afternoon. He is currently on metoprolol  and telmisartan  for blood pressure management. He previously tried amlodipine  but discontinued it due to dizziness. His home blood pressure readings are consistent with those observed in the clinic.  He feels 'lousy' in the mornings despite using a CPAP machine, which shows good numbers. He experiences significant fatigue and lethargy, especially after meals, where he struggles to stay awake for about 20 minutes. He has a history of low sodium levels, with recent values as low as 124 mEq/L.  He  feels that TAVR initially improved his physical capacity. However, he now feels he has plateaued. He experiences leg pain when walking, which limits his activities, such as attending fairs or arts and crafts events.   He recently underwent cataract surgery, which temporarily limited  his physical activity. No chest pain or pressure, but he reports occasional shortness of breath. He is on multiple medications, including metoprolol , telmisartan , aspirin , cilostazol , and a statin for cholesterol management. He is due for a cholesterol check soon.  He notes claudication that limits his exercise and what he wants to do such as going to the fair.  He experiences extremely dry skin, which he manages with creams. His thyroid  function has been checked and is normal. He works outside and typically drinks 34 to 36 ounces of water  daily.  ROS:  As per HPI  Studies Reviewed: .       Echo 07/2023:  1. Left ventricular ejection fraction, by estimation, is 60 to 65%. The  left ventricle has normal function. The left ventricle has no regional  wall motion abnormalities. There is mild left ventricular hypertrophy.  Left ventricular diastolic parameters  are indeterminate.   2. Device leads on RA/RV. Right ventricular systolic function is mildly  reduced. The right ventricular size is mildly enlarged.   3. Left atrial size was severely dilated.   4. The mitral valve is degenerative. Trivial mitral valve regurgitation.  No evidence of mitral stenosis. Severe mitral annular calcification.   5. 29 mm Sapien 3 valve no PVL mild central regurgitation stable low  gardients. The aortic valve has been repaired/replaced. Aortic valve  regurgitation is mild. No aortic stenosis is present.   6. The inferior vena cava is normal in size with greater than 50%  respiratory variability, suggesting right atrial pressure of 3 mmHg.   Risk Assessment/Calculations:     HYPERTENSION CONTROL Vitals:   03/04/24 0844 03/04/24 1601  BP: (!) 162/68 (!) 166/80    The patient's blood pressure is elevated above target today.  In order to address the patient's elevated BP:       STOP-Bang Score:         Physical Exam:   VS:  BP (!) 166/80   Pulse 70   Ht 5' 7 (1.702 m)   Wt 171 lb 6.4 oz (77.7 kg)    SpO2 93%   BMI 26.85 kg/m  , BMI Body mass index is 26.85 kg/m. GENERAL:  Well appearing HEENT: Pupils equal round and reactive, fundi not visualized, oral mucosa unremarkable NECK:  No jugular venous distention, waveform within normal limits, carotid upstroke brisk and symmetric, no bruits, no thyromegaly LUNGS:  Clear to auscultation bilaterally HEART:  RRR.  PMI not displaced or sustained,S1 and S2 within normal limits, no S3, no S4, no clicks, no rubs, no murmurs ABD:  Flat, positive bowel sounds normal in frequency in pitch, no bruits, no rebound, no guarding, no midline pulsatile mass, no hepatomegaly, no splenomegaly EXT: Unable to palpate DP/PT bilaterally.   No edema, no cyanosis no clubbing SKIN:  No rashes no nodules NEURO:  Cranial nerves II through XII grossly intact, motor grossly intact throughout PSYCH:  Cognitively intact, oriented to person place and time   ASSESSMENT AND PLAN: .    Assessment & Plan # Hypertension Hypertension with variable readings. Considered medication timing and metabolism for better control. Discussed splitting telmisartan  dose for consistent control.  It is high in the AM but thenin the 110s in the evening.  -  Split telmisartan  dose: half in morning, half at night. - Monitor blood pressure at home, report via MyChart in two weeks.  # Peripheral Artery Disease Intermittent claudication managed with aspirin  and cilostazol . Discussed low-dose Xarelto for risk reduction. Revascularization reserved for significant symptoms. - Add low-dose Xarelto 2.5 mg bid. - Log leg pain and claudication symptoms for vascular specialist discussion.  It will be helpful if he can better quantify his symptoms and how often he limits activities - Plan cholesterol check during next lab work with primary care. - Continue aspirin , statin, metoprolol , and cilostazol   # Hyponatremia Chronic hyponatremia with sodium at 124 mEq/L. No medication cause identified. Managed  with reduced water  intake. Considered SIADH, kidney, or endocrine disorder. - Continue reduced water  intake as advised by primary care. - Re-evaluate sodium levels in three months with primary care. - Consider nephrology referral if sodium remains low after re-evaluation.  # Hyperlipidemia: LDL goal <55.  Continue rosuvastatin .          Dispo: f/u APP/PharmD in 2 months.  Kelleen Stolze C. Theodis Fiscal, MD, [facility] in 6 months  Signed, Maudine Sos, MD

## 2024-03-07 NOTE — Progress Notes (Signed)
 Remote pacemaker transmission.

## 2024-03-07 NOTE — Addendum Note (Signed)
 Addended by: Edra Govern D on: 03/07/2024 05:24 PM   Modules accepted: Orders

## 2024-03-18 MED ORDER — TELMISARTAN 40 MG PO TABS: 40.0000 mg | ORAL_TABLET | Freq: Two times a day (BID) | ORAL | 3 refills | Status: DC

## 2024-03-20 ENCOUNTER — Telehealth: Payer: Self-pay | Admitting: Pharmacy Technician

## 2024-03-20 ENCOUNTER — Other Ambulatory Visit (HOSPITAL_COMMUNITY): Payer: Self-pay

## 2024-03-20 MED ORDER — TELMISARTAN 80 MG PO TABS: 40.0000 mg | ORAL_TABLET | Freq: Two times a day (BID) | ORAL | 3 refills | Status: AC

## 2024-03-20 NOTE — Telephone Encounter (Signed)
   TELMISARTAN  FILLED TODAY 90/90DS

## 2024-03-26 DIAGNOSIS — H353221 Exudative age-related macular degeneration, left eye, with active choroidal neovascularization: Secondary | ICD-10-CM | POA: Diagnosis not present

## 2024-03-31 ENCOUNTER — Telehealth: Payer: Self-pay | Admitting: Cardiovascular Disease

## 2024-03-31 NOTE — Telephone Encounter (Signed)
 Pt called in stating he dropped off some bp readings to the office a few weeks ago and wants to know if any changes needed to be made to his medication. Please advise.

## 2024-04-01 ENCOUNTER — Encounter (HOSPITAL_BASED_OUTPATIENT_CLINIC_OR_DEPARTMENT_OTHER): Payer: Self-pay | Admitting: Cardiovascular Disease

## 2024-04-01 NOTE — Telephone Encounter (Signed)
See my chart message from 7/15.

## 2024-04-01 NOTE — Telephone Encounter (Signed)
 At last clinic visit recommended to split Telmisartan . BP readings from 6/18-7/1 reviewed. Out of 28 readings, only 3 readings with SBP <110. Based on provided readings, average AM BP 128/64 and average PM BP 124/65 with overall BP range.  BP seems to be less labile since medication changes.  If he has a SBP <110, may hold next Telmisartan  dose.   Would recommend continuing 40mg  BID and monitoring BP. If hypotension (SBP <110) is noted recommend eating and drinking something to help prevent BP from lowering further.   Jonisha Kindig S Torey Reinard, NP

## 2024-04-02 ENCOUNTER — Other Ambulatory Visit: Payer: Self-pay | Admitting: Internal Medicine

## 2024-04-02 ENCOUNTER — Other Ambulatory Visit (INDEPENDENT_AMBULATORY_CARE_PROVIDER_SITE_OTHER)

## 2024-04-02 DIAGNOSIS — E785 Hyperlipidemia, unspecified: Secondary | ICD-10-CM

## 2024-04-02 LAB — LIPID PANEL
Cholesterol: 137 mg/dL (ref 0–200)
HDL: 66.5 mg/dL (ref >39.00)
LDL Cholesterol: 63 mg/dL (ref 0–99)
NonHDL: 70.53
Total CHOL/HDL Ratio: 2
Triglycerides: 36 mg/dL (ref 0.0–149.0)
VLDL: 7.2 mg/dL (ref 0.0–40.0)

## 2024-04-07 ENCOUNTER — Encounter: Payer: Self-pay | Admitting: Internal Medicine

## 2024-04-10 NOTE — Telephone Encounter (Unsigned)
 Copied from CRM [id number]. Topic: Clinical - Request for Lab/Test Order >> Apr 10, 2024  8:44 AM [name] wrote: Reason for CRM: Patient is calling to follow up on a lab request he submitted through MyChart. He would like to check his sodium & vitamin levels.

## 2024-04-17 ENCOUNTER — Other Ambulatory Visit: Payer: Self-pay | Admitting: Internal Medicine

## 2024-04-17 DIAGNOSIS — E222 Syndrome of inappropriate secretion of antidiuretic hormone: Secondary | ICD-10-CM

## 2024-04-17 DIAGNOSIS — N182 Chronic kidney disease, stage 2 (mild): Secondary | ICD-10-CM

## 2024-04-17 NOTE — Progress Notes (Unsigned)
 Lab Results  Component Value Date   WBC 6.0 02/19/2024   HGB 13.3 02/19/2024   HCT 38.6 (L) 02/19/2024   PLT 268.0 02/19/2024   GLUCOSE 93 02/19/2024   CHOL 137 04/02/2024   TRIG 36.0 04/02/2024   HDL 66.50 04/02/2024   LDLCALC 63 04/02/2024   ALT 18 07/09/2023   AST 24 07/09/2023   NA 124 (L) 02/19/2024   K 4.1 02/19/2024   CL 90 (L) 02/19/2024   CREATININE 0.99 02/19/2024   BUN 14 02/19/2024   CO2 27 02/19/2024   TSH 1.90 02/19/2024   PSA 3.90 09/13/2023   INR 1.1 07/09/2023   HGBA1C 5.7 04/17/2023

## 2024-04-18 ENCOUNTER — Other Ambulatory Visit (INDEPENDENT_AMBULATORY_CARE_PROVIDER_SITE_OTHER)

## 2024-04-18 DIAGNOSIS — N182 Chronic kidney disease, stage 2 (mild): Secondary | ICD-10-CM | POA: Diagnosis not present

## 2024-04-18 DIAGNOSIS — E222 Syndrome of inappropriate secretion of antidiuretic hormone: Secondary | ICD-10-CM

## 2024-04-18 LAB — BASIC METABOLIC PANEL WITH GFR
BUN: 16 mg/dL (ref 6–23)
CO2: 27 meq/L (ref 19–32)
Calcium: 8.7 mg/dL (ref 8.4–10.5)
Chloride: 97 meq/L (ref 96–112)
Creatinine, Ser: 1.03 mg/dL (ref 0.40–1.50)
GFR: 69.92 mL/min (ref >60.00)
Glucose, Bld: 103 mg/dL — ABNORMAL HIGH (ref 70–99)
Potassium: 4.2 meq/L (ref 3.5–5.1)
Sodium: 130 meq/L — ABNORMAL LOW (ref 135–145)

## 2024-04-21 ENCOUNTER — Ambulatory Visit: Payer: Medicare PPO

## 2024-04-21 DIAGNOSIS — I442 Atrioventricular block, complete: Secondary | ICD-10-CM

## 2024-04-21 LAB — CUP PACEART REMOTE DEVICE CHECK
Date Time Interrogation Session: 20250804125756
Implantable Lead Connection Status: 753985
Implantable Lead Connection Status: 753985
Implantable Lead Implant Date: 20241023
Implantable Lead Implant Date: 20241023
Implantable Lead Location: 753859
Implantable Lead Location: 753860
Implantable Lead Model: 377
Implantable Lead Model: 377
Implantable Lead Serial Number: 8001570721
Implantable Lead Serial Number: 8001632583
Implantable Pulse Generator Implant Date: 20241023
Pulse Gen Serial Number: 1000330918

## 2024-04-22 ENCOUNTER — Ambulatory Visit: Payer: Self-pay | Admitting: Internal Medicine

## 2024-04-23 ENCOUNTER — Ambulatory Visit: Payer: Self-pay | Admitting: Cardiology

## 2024-04-30 DIAGNOSIS — H35361 Drusen (degenerative) of macula, right eye: Secondary | ICD-10-CM | POA: Diagnosis not present

## 2024-04-30 DIAGNOSIS — H353221 Exudative age-related macular degeneration, left eye, with active choroidal neovascularization: Secondary | ICD-10-CM | POA: Diagnosis not present

## 2024-04-30 DIAGNOSIS — H353112 Nonexudative age-related macular degeneration, right eye, intermediate dry stage: Secondary | ICD-10-CM | POA: Diagnosis not present

## 2024-05-02 ENCOUNTER — Other Ambulatory Visit: Payer: Self-pay | Admitting: Student

## 2024-05-02 ENCOUNTER — Ambulatory Visit (INDEPENDENT_AMBULATORY_CARE_PROVIDER_SITE_OTHER): Admitting: Otolaryngology

## 2024-05-02 ENCOUNTER — Encounter (INDEPENDENT_AMBULATORY_CARE_PROVIDER_SITE_OTHER): Payer: Self-pay | Admitting: Otolaryngology

## 2024-05-02 VITALS — BP 122/69 | HR 76

## 2024-05-02 DIAGNOSIS — J310 Chronic rhinitis: Secondary | ICD-10-CM | POA: Diagnosis not present

## 2024-05-02 DIAGNOSIS — H6123 Impacted cerumen, bilateral: Secondary | ICD-10-CM

## 2024-05-02 DIAGNOSIS — I10 Essential (primary) hypertension: Secondary | ICD-10-CM

## 2024-05-02 DIAGNOSIS — R0981 Nasal congestion: Secondary | ICD-10-CM | POA: Diagnosis not present

## 2024-05-02 DIAGNOSIS — H903 Sensorineural hearing loss, bilateral: Secondary | ICD-10-CM | POA: Diagnosis not present

## 2024-05-02 MED ORDER — CIPROFLOXACIN-DEXAMETHASONE 0.3-0.1 % OT SUSP: 4.0000 [drp] | Freq: Two times a day (BID) | OTIC | 5 refills | Status: AC

## 2024-05-04 NOTE — Progress Notes (Signed)
 Patient ID: Brian Hunt, male   DOB: Sep 11, 1946, 78 y.o.   MRN: [id number]  Follow-up: Chronic nasal congestion, hearing loss  HPI: The patient is a 78 year old male who returns today for his follow-up evaluation.  The patient has a history of chronic nasal congestion and bilateral high-frequency sensorineural hearing loss.  The patient was last seen in May 2025.  At that time, his bilateral hearing loss was subjectively stable.  The patient returns today reporting improvement in his nasal congestion with the use of Flonase.  He denies any change in his hearing.  He has recently noted increasing clogging sensation in his ears.  He denies any significant otalgia, otorrhea, facial pain, or fever.  Exam: General: Communicates without difficulty, well nourished, no acute distress. Head: Normocephalic, no evidence injury, no tenderness, facial buttresses intact without stepoff. Face/sinus: No tenderness to palpation and percussion. Facial movement is normal and symmetric. Eyes: PERRL, EOMI. No scleral icterus, conjunctivae clear. Neuro: CN II exam reveals vision grossly intact.  No nystagmus at any point of gaze. Ears: Auricles well formed without lesions.  Bilateral cerumen impaction.  Nose: External evaluation reveals normal support and skin without lesions.  Dorsum is intact.  Anterior rhinoscopy reveals congested mucosa over anterior aspect of inferior turbinates and intact septum.  No purulence noted. Oral:  Oral cavity and oropharynx are intact, symmetric, without erythema or edema.  Mucosa is moist without lesions. Neck: Full range of motion without pain.  There is no significant lymphadenopathy.  No masses palpable.  Thyroid  bed within normal limits to palpation.  Parotid glands and submandibular glands equal bilaterally without mass.  Trachea is midline. Neuro:  CN 2-12 grossly intact.   Procedure: Bilateral cerumen disimpaction Anesthesia: None Description: Under the operating microscope, the  cerumen is carefully removed with a combination of cerumen currette, alligator forceps, and suction catheters.  After the cerumen is removed, polypoid tissue is noted within the left ear canal.  No mass, erythema, or lesions. The patient tolerated the procedure well.    Assessment: 1.  Chronic rhinitis with nasal mucosal congestion.  The severity of his nasal congestion has decreased. 2.  Subjectively stable bilateral high-frequency sensorineural hearing loss. 3.  Bilateral cerumen impaction.  After the cerumen removal procedure, polypoid tissue is noted within the left ear canal.  Plan: 1.  The physical exam findings are reviewed with the patient. 2.  Otomicroscopy with bilateral cerumen disimpaction. 3.  Ciprodex  eardrops 4 drops left ear twice daily for 2 weeks. 4.  The patient will return for reevaluation in 3 months, sooner if needed.

## 2024-05-06 ENCOUNTER — Ambulatory Visit (INDEPENDENT_AMBULATORY_CARE_PROVIDER_SITE_OTHER): Admitting: Otolaryngology

## 2024-05-13 DIAGNOSIS — N401 Enlarged prostate with lower urinary tract symptoms: Secondary | ICD-10-CM | POA: Diagnosis not present

## 2024-05-13 DIAGNOSIS — R351 Nocturia: Secondary | ICD-10-CM | POA: Diagnosis not present

## 2024-05-28 DIAGNOSIS — H353221 Exudative age-related macular degeneration, left eye, with active choroidal neovascularization: Secondary | ICD-10-CM | POA: Diagnosis not present

## 2024-06-03 ENCOUNTER — Telehealth (INDEPENDENT_AMBULATORY_CARE_PROVIDER_SITE_OTHER): Payer: Self-pay | Admitting: Otolaryngology

## 2024-06-03 DIAGNOSIS — N401 Enlarged prostate with lower urinary tract symptoms: Secondary | ICD-10-CM | POA: Diagnosis not present

## 2024-06-03 NOTE — Telephone Encounter (Signed)
 The patient called in reporting that his ear is feeling infected again and he wants to know if Dr Karis will send in another round of antibiotics for him? He saw Dr Karis on 05/06/2024 and he says he did finish out the medication he prescribed for him at that visit. Please advise.

## 2024-06-03 NOTE — Telephone Encounter (Signed)
 Returned patient 's phone call. Left him a voice mail.

## 2024-06-04 ENCOUNTER — Encounter (INDEPENDENT_AMBULATORY_CARE_PROVIDER_SITE_OTHER): Payer: Self-pay | Admitting: Otolaryngology

## 2024-06-04 ENCOUNTER — Ambulatory Visit (INDEPENDENT_AMBULATORY_CARE_PROVIDER_SITE_OTHER): Admitting: Otolaryngology

## 2024-06-04 VITALS — BP 126/78 | HR 74 | Temp 97.6°F

## 2024-06-04 DIAGNOSIS — J310 Chronic rhinitis: Secondary | ICD-10-CM | POA: Diagnosis not present

## 2024-06-04 DIAGNOSIS — H903 Sensorineural hearing loss, bilateral: Secondary | ICD-10-CM

## 2024-06-04 DIAGNOSIS — H6982 Other specified disorders of Eustachian tube, left ear: Secondary | ICD-10-CM

## 2024-06-04 DIAGNOSIS — R0981 Nasal congestion: Secondary | ICD-10-CM | POA: Diagnosis not present

## 2024-06-06 DIAGNOSIS — H6982 Other specified disorders of Eustachian tube, left ear: Secondary | ICD-10-CM | POA: Insufficient documentation

## 2024-06-06 NOTE — Progress Notes (Signed)
 Patient ID: Brian Hunt, male   DOB: 12-30-1945, 78 y.o.   MRN: [id number]  Follow-up: Chronic nasal congestion, hearing loss, eustachian tube dysfunction  HPI: The patient is a 78 year old male who returns today for his follow-up evaluation.  The patient has a history of chronic nasal congestion and bilateral sensorineural hearing loss.  He was noted to have chronic rhinitis with nasal mucosal congestion.  The patient returns today complaining of clogging sensation in his left ear for the past 5 days.  He denies any significant otalgia or drainage.  He is no longer using Flonase.  Exam: General: Communicates without difficulty, well nourished, no acute distress. Head: Normocephalic, no evidence injury, no tenderness, facial buttresses intact without stepoff. Face/sinus: No tenderness to palpation and percussion. Facial movement is normal and symmetric. Eyes: PERRL, EOMI. No scleral icterus, conjunctivae clear. Neuro: CN II exam reveals vision grossly intact.  No nystagmus at any point of gaze. Ears: Auricles well formed without lesions.  Ear canals are intact without mass or lesion.  No erythema or edema is appreciated.  The TMs are intact without fluid. Nose: External evaluation reveals normal support and skin without lesions.  Dorsum is intact.  Anterior rhinoscopy reveals congested mucosa over anterior aspect of inferior turbinates and intact septum.  No purulence noted. Oral:  Oral cavity and oropharynx are intact, symmetric, without erythema or edema.  Mucosa is moist without lesions. Neck: Full range of motion without pain.  There is no significant lymphadenopathy.  No masses palpable.  Thyroid  bed within normal limits to palpation.  Parotid glands and submandibular glands equal bilaterally without mass.  Trachea is midline. Neuro:  CN 2-12 grossly intact.   Assessment: 1.  Chronic rhinitis with diffuse nasal mucosal congestion. 2.  His ear canals, tympanic membranes, and middle ear spaces are  normal.  No middle ear effusion or infection is noted. 3.  Left ear eustachian tube dysfunction.  Plan: 1.  The physical exam findings are reviewed with the patient. 2.  Restart Flonase nasal spray 2 sprays each nostril daily. 3.  Valsalva exercise multiple times a day. 4.  The patient will return for reevaluation in 2 months.

## 2024-06-10 ENCOUNTER — Other Ambulatory Visit: Payer: Self-pay | Admitting: Internal Medicine

## 2024-06-10 DIAGNOSIS — I6523 Occlusion and stenosis of bilateral carotid arteries: Secondary | ICD-10-CM

## 2024-06-10 DIAGNOSIS — E785 Hyperlipidemia, unspecified: Secondary | ICD-10-CM

## 2024-06-10 DIAGNOSIS — I739 Peripheral vascular disease, unspecified: Secondary | ICD-10-CM

## 2024-06-16 NOTE — Progress Notes (Signed)
 Remote PPM Transmission

## 2024-06-25 ENCOUNTER — Telehealth (INDEPENDENT_AMBULATORY_CARE_PROVIDER_SITE_OTHER): Payer: Self-pay | Admitting: Otolaryngology

## 2024-06-25 NOTE — Telephone Encounter (Signed)
 Patient called and stated that the Flonase is helping much . Informed him to keep using it , give it more time. He will f/u on 08/18/24, sooner if needed.

## 2024-07-03 NOTE — Progress Notes (Signed)
 [facility]   MULTIDISCIPLINARY HEART VALVE CLINIC                                     Cardiology Office Note:    Date:  07/07/2024   ID:  Brian Hunt, DOB [birth year], MRN [id number]  PCP:  Joshua Debby CROME, MD  [facility] Cardiologist:  Annabella Scarce, MD  [facility] Structural heart: Brian Fell, MD [facility] Electrophysiologist:  Brian ONEIDA HOLTS, MD   Referring MD: Joshua Debby CROME, MD   1 year s/p TAVR  History of Present Illness:    Brian Hunt is a 78 y.o. 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   Brian Hunt has been followed over time for aortic stenosis. He began having issues with fatigue and DOE. Echo on 05/31/2023 showed EF 45-50%, mild LVH, and severe AS with a mean grad 41 mmHg, AVA 0.70 cm2 and mod-severe MAC. [facility] 06/18/23 showed mild nonobstructive disease. S/p successful TAVR with a 29 mm Moroni Sapien 3 Ultra Resilia THV via the TF approach on 07/10/23. Post operative echo showed EF 65%, normally functioning TAVR with a mean gradient of 11 mmHg and no PVL as well as moderate MAC with with mild MS. Hospital course c/b CHB s/p Biotronik DDD PPM 10/23 with Dr. HOLTS. 1 month echo showed normalization of his EF to 65% and normally functioning TAVR with a mean gradient of 6 mmHg and mild central AI.   Today the patient presents to clinic for follow up. Here alone. He has been doing well. No CP or SOB. No LE edema, orthopnea or PND. He has occasional dizziness mostly int he mornings with positional changes but no syncope. No blood in stool or urine. No palpitations. Exercises regularly which has really helped his PAD- still has some mild claudication.     Past Medical History:  Diagnosis Date   Anemia    Anxiety    Aortic stenosis,  severe 03/09/2018              Reading Physician    Reading Date    Result Priority      Brian Wilbert SAUNDERS, MD [phone]    07/06/2021    Routine     Narrative & Impression       The study is normal. The study is low risk.    RBBB with repolarization changes was present at baseline with no ST changes from baseline during infusion.  Occsaional PVCs were noted.    LV perfusion is normal. There is no evidence of is   Arthritis    Basal cell carcinoma    Basal Cell X2 , Dr Brian   BPH (benign prostatic hyperplasia)    Bursitis of left hip    Dr. 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   aspirin  EC 81 MG tablet Take 81 mg by mouth daily.   cilostazol  (PLETAL ) 50 MG tablet TAKE 1 TABLET(50 MG) BY MOUTH TWICE DAILY   cyanocobalamin  (VITAMIN B12) 1000 MCG tablet Take 1,000 mcg by mouth daily.   famotidine (PEPCID) 20 MG tablet Take 20 mg by mouth daily as needed for heartburn or indigestion.   finasteride (PROSCAR) 5 MG tablet Take 5 mg by mouth daily.   fluocinonide -emollient (LIDEX -E) 0.05 % cream Apply 1 Application topically 2 (two) times daily.   fluticasone (FLONASE) 50 MCG/ACT nasal spray Place 2 sprays into both nostrils daily.    hydrocortisone cream 1 % Apply 1 Application topically daily as needed for itching.   metoprolol  succinate (TOPROL -XL) 25 MG 24 hr tablet TAKE 1 TABLET(25 MG) BY MOUTH DAILY WITH OR IMMEDIATELY FOLLOWING A MEAL   Propylene Glycol (SYSTANE BALANCE) 0.6 % SOLN Place 1 drop into both eyes as needed (dry eyes).   rivaroxaban  (XARELTO ) 2.5 MG TABS tablet Take 1 tablet (2.5 mg total) by mouth 2 (two) times daily.   rosuvastatin  (CRESTOR ) 5 MG tablet TAKE 1 TABLET EVERY DAY   telmisartan  (MICARDIS ) 80 MG tablet Take 0.5 tablets (40 mg total) by mouth in the morning and at bedtime.   Tiotropium Bromide-Olodaterol (STIOLTO RESPIMAT ) 2.5-2.5 MCG/ACT AERS Inhale 1 puff into the lungs daily.   [DISCONTINUED] tamsulosin  (FLOMAX ) 0.4 MG CAPS capsule Take 0.4 mg by mouth at bedtime.      ROS:   Please see the history of present illness.    All other systems reviewed and are negative.  EKGs       Risk Assessment/Calculations:           Physical Exam:    VS:  BP (!) 140/76   Pulse 64   Ht 5' 7 (1.702 m)   Wt 168 lb 6.4 oz (76.4 kg)   SpO2 97%   BMI 26.38 kg/m     Wt Readings from Last 3 Encounters:  07/07/24 168 lb 6.4 oz (76.4 kg)  03/04/24 171 lb 6.4 oz (77.7 kg)  02/19/24 171 lb 6.4 oz (77.7 kg)     GEN: Well nourished, well developed in no acute distress NECK: No JVD CARDIAC: RRR, no murmurs, rubs, gallops RESPIRATORY:  Clear to auscultation without rales, wheezing or rhonchi  ABDOMEN: Soft, non-tender, non-distended EXTREMITIES:  No edema; No deformity.   ASSESSMENT:    1. S/P TAVR (transcatheter aortic valve replacement)   2. S/P placement of cardiac pacemaker   3. Primary hypertension   4. Heart failure with improved ejection fraction (HFimpEF) (HCC)   5. Hyponatremia   6. PAD (peripheral artery disease)   7. Orthostatic dizziness     PLAN:    In order of problems listed above:  Severe AS s/p TAVR:  -- Echo today shows EF 60%, mild LVH of the basal-septal  segment, severe MAC and normally functioning TAVR with a mean gradient of 7 mm hg and mild PVL (noted at 10 o'clock and 6 o'clock  in the PSAX views.)  -- NYHA class I symptoms.  -- Continue aspirin  81 mg daily.  -- SBE prophylaxis discussed; he has amoxicillin .  -- Continue regular follow up with Dr. Raford   S/p PPM:  -- Continue follow up with EP.   HTN: -- BP mildly elevated today but reviewed extensive log from home which showed overall good BP control.  -- Continue telmisartan  40mg  BID and Toprol  XL 25 mg daily.  HFimpEF:  -- EF normalized to 60%. -- No s/s volume overload. Not on any diuretic therapy. -- Continue telmisartan  40mg  BID and Toprol  XL 25 mg daily.   Hyponatremia:  -- NA 130 on last check 04/18/24 (personally reviewed). -- This has been a chronic ongoing issue for him.   PAD with LE claudication: -- Continue aspirin  81mg  daily and low dose Xarelto  2.5mg  daily.  -- Continue cilostazol  50mg  daily.  -- Continue Crestor  5mg  daily.  Orthostatic hypotension: -- Noted to have dizziness with positional changes when standing from sitting, mostly in the mornings. He has not checked BP during the sx.  -- Will stop tamsulosin  0.4 mg daily and see if that helps. (He said he has not needed it for BPH). -- We discussed staying hydrated and standing up slowly.     Medication Adjustments/Labs and Tests Ordered: Current medicines are reviewed at length with the patient today.  Concerns regarding medicines are outlined above.  No orders of the defined types were placed in this encounter.  No orders of the defined types were placed in this encounter.   Patient Instructions  Medication Instructions:  Your physician has recommended you make the following change in your medication: STOP taking Flomax    *If you need a refill on your cardiac medications before your next appointment, please call your pharmacy*  Lab Work: None needed If you have labs (blood work) drawn today  and your tests are completely normal, you will receive your results only by: MyChart Message (if you have MyChart) OR A paper copy in the mail If you have any lab test that is abnormal or we need to change your treatment, we will call you to review the results.  Testing/Procedures: None needed  Follow-Up: At [facility], you and your health needs are our priority.  As part of our continuing mission to provide you with exceptional heart care, our providers are all part of one team.  This team includes your primary Cardiologist (physician) and Advanced Practice Providers or APPs (Physician Assistants and Nurse Practitioners) who all work together to provide you with the care you need, when you need it.  Your next appointment:   08/20/24 at 9:00 AM Provider:   Dr. Court  We recommend signing up for the patient portal called MyChart.  Sign up information is provided on this After Visit Summary.  MyChart is used to connect with patients for Virtual Visits (Telemedicine).  Patients are able to view lab/test results, encounter notes, upcoming appointments, etc.  Non-urgent messages can be sent to your provider as well.   To learn more about what you can do with MyChart, go to [email or web address].           Signed, Lamarr Hummer, PA-C  07/07/2024 12:47 PM    [facility]

## 2024-07-07 ENCOUNTER — Ambulatory Visit: Payer: Self-pay | Admitting: Physician Assistant

## 2024-07-07 ENCOUNTER — Ambulatory Visit: Payer: Medicare PPO | Attending: Physician Assistant | Admitting: Physician Assistant

## 2024-07-07 ENCOUNTER — Ambulatory Visit (HOSPITAL_COMMUNITY)
Admission: RE | Admit: 2024-07-07 | Discharge: 2024-07-07 | Disposition: A | Discharge status: Home / Self Care | Payer: Medicare PPO | Source: Ambulatory Visit | Attending: Cardiology | Admitting: Cardiology

## 2024-07-07 VITALS — BP 140/76 | HR 64 | Ht 67.0 in | Wt 168.4 lb

## 2024-07-07 DIAGNOSIS — Z950 Presence of cardiac pacemaker: Secondary | ICD-10-CM | POA: Diagnosis not present

## 2024-07-07 DIAGNOSIS — R42 Dizziness and giddiness: Secondary | ICD-10-CM | POA: Diagnosis not present

## 2024-07-07 DIAGNOSIS — Z952 Presence of prosthetic heart valve: Secondary | ICD-10-CM

## 2024-07-07 DIAGNOSIS — I5020 Unspecified systolic (congestive) heart failure: Secondary | ICD-10-CM

## 2024-07-07 DIAGNOSIS — E871 Hypo-osmolality and hyponatremia: Secondary | ICD-10-CM | POA: Diagnosis not present

## 2024-07-07 DIAGNOSIS — I10 Essential (primary) hypertension: Secondary | ICD-10-CM | POA: Diagnosis not present

## 2024-07-07 DIAGNOSIS — I739 Peripheral vascular disease, unspecified: Secondary | ICD-10-CM

## 2024-07-07 LAB — ECHOCARDIOGRAM COMPLETE
AR max vel: 1.63 cm2
AV Area VTI: 2.07 cm2
AV Area mean vel: 2.19 cm2
AV Mean grad: 7 mmHg
AV Peak grad: 14.9 mmHg
Ao pk vel: 1.93 m/s
Area-P 1/2: 2.24 cm2
P 1/2 time: 784 ms
S' Lateral: 2.3 cm

## 2024-07-07 NOTE — Patient Instructions (Addendum)
 Medication Instructions:  Your physician has recommended you make the following change in your medication: STOP taking Flomax    *If you need a refill on your cardiac medications before your next appointment, please call your pharmacy*  Lab Work: None needed If you have labs (blood work) drawn today and your tests are completely normal, you will receive your results only by: MyChart Message (if you have MyChart) OR A paper copy in the mail If you have any lab test that is abnormal or we need to change your treatment, we will call you to review the results.  Testing/Procedures: None needed  Follow-Up: At [facility], you and your health needs are our priority.  As part of our continuing mission to provide you with exceptional heart care, our providers are all part of one team.  This team includes your primary Cardiologist (physician) and Advanced Practice Providers or APPs (Physician Assistants and Nurse Practitioners) who all work together to provide you with the care you need, when you need it.  Your next appointment:   08/20/24 at 9:00 AM Provider:   Dr. Court  We recommend signing up for the patient portal called MyChart.  Sign up information is provided on this After Visit Summary.  MyChart is used to connect with patients for Virtual Visits (Telemedicine).  Patients are able to view lab/test results, encounter notes, upcoming appointments, etc.  Non-urgent messages can be sent to your provider as well.   To learn more about what you can do with MyChart, go to [email or web address].

## 2024-07-21 ENCOUNTER — Ambulatory Visit (INDEPENDENT_AMBULATORY_CARE_PROVIDER_SITE_OTHER): Payer: Medicare PPO

## 2024-07-21 DIAGNOSIS — I428 Other cardiomyopathies: Secondary | ICD-10-CM | POA: Diagnosis not present

## 2024-07-22 ENCOUNTER — Ambulatory Visit: Payer: Self-pay | Admitting: Cardiology

## 2024-07-22 ENCOUNTER — Other Ambulatory Visit: Payer: Self-pay | Admitting: Cardiovascular Disease

## 2024-07-22 LAB — CUP PACEART REMOTE DEVICE CHECK
Battery Voltage: 90
Date Time Interrogation Session: 20251103111302
Implantable Lead Connection Status: 753985
Implantable Lead Connection Status: 753985
Implantable Lead Implant Date: 20241023
Implantable Lead Implant Date: 20241023
Implantable Lead Location: 753859
Implantable Lead Location: 753860
Implantable Lead Model: 377
Implantable Lead Model: 377
Implantable Lead Serial Number: 8001570721
Implantable Lead Serial Number: 8001632583
Implantable Pulse Generator Implant Date: 20241023
Pulse Gen Serial Number: 1000330918

## 2024-07-23 DIAGNOSIS — H353221 Exudative age-related macular degeneration, left eye, with active choroidal neovascularization: Secondary | ICD-10-CM | POA: Diagnosis not present

## 2024-07-23 MED ORDER — AMOXICILLIN 500 MG PO TABS: ORAL_TABLET | ORAL | 3 refills | Status: DC

## 2024-07-23 NOTE — Progress Notes (Signed)
 Remote PPM Transmission

## 2024-08-01 ENCOUNTER — Ambulatory Visit (INDEPENDENT_AMBULATORY_CARE_PROVIDER_SITE_OTHER): Admitting: Otolaryngology

## 2024-08-08 ENCOUNTER — Encounter: Payer: Self-pay | Admitting: Internal Medicine

## 2024-08-08 ENCOUNTER — Ambulatory Visit: Admitting: Internal Medicine

## 2024-08-08 VITALS — BP 158/64 | HR 65 | Temp 98.0°F | Ht 67.0 in | Wt 169.0 lb

## 2024-08-08 DIAGNOSIS — I10 Essential (primary) hypertension: Secondary | ICD-10-CM

## 2024-08-08 DIAGNOSIS — J329 Chronic sinusitis, unspecified: Secondary | ICD-10-CM | POA: Insufficient documentation

## 2024-08-08 DIAGNOSIS — J309 Allergic rhinitis, unspecified: Secondary | ICD-10-CM | POA: Insufficient documentation

## 2024-08-08 MED ORDER — CEFDINIR 300 MG PO CAPS: 300.0000 mg | ORAL_CAPSULE | Freq: Two times a day (BID) | ORAL | 0 refills | Status: DC

## 2024-08-08 MED ORDER — PREDNISONE 10 MG PO TABS: ORAL_TABLET | ORAL | 0 refills | Status: DC

## 2024-08-08 NOTE — Patient Instructions (Signed)
 Please take all new medication as prescribed - the antibiotic, and prednisone   Ok to change the Flonase to Nasacort due to the nosebleeds  You can also take Delsym OTC for cough, and/or Mucinex (or it's generic off brand) for congestion, and tylenol  as needed for pain.  Please continue all other medications as before, and refills have been done if requested.  Please have the pharmacy call with any other refills you may need  Please keep your appointments with your specialists as you may have planned  You may wish to follow up with Cardiology if you have further low blood pressures and dizziness

## 2024-08-08 NOTE — Assessment & Plan Note (Signed)
 Mild to mod, for antibx course, zpack  to f/u any worsening symptoms or concerns

## 2024-08-08 NOTE — Assessment & Plan Note (Signed)
 Mild to mod, for change flonase to nasacort asd due to recent nosebleeds,  to f/u any worsening symptoms or concerns

## 2024-08-08 NOTE — Assessment & Plan Note (Signed)
 BP Readings from Last 3 Encounters:  08/08/24 (!) 158/64  07/07/24 (!) 140/76  06/04/24 126/78   Uncontrolled, likely reactive, pt states has low normal BP at home, pt to continue medical treatment toprol  xl 25 every day, micardis  80 mg qd

## 2024-08-08 NOTE — Progress Notes (Signed)
 Patient ID: Brian Hunt, male   DOB: [birth year], 78 y.o.   MRN: [id number]        Chief Complaint: follow up sinusitis, allergic rhinitis, htn       HPI:  Brian Hunt is a 78 y.o. male  Here with 2-3 days acute onset fever, facial pain, pressure, headache, general weakness and malaise, and greenish d/c, with mild ST and cough, but pt denies chest pain, wheezing, increased sob or doe, orthopnea, PND, increased LE swelling, palpitations, dizziness or syncope.  Does have several wks ongoing nasal allergy symptoms with clearish congestion, itch and sneezing, without fever, pain, ST, cough, swelling or wheezing.         Wt Readings from Last 3 Encounters:  08/08/24 169 lb (76.7 kg)  07/07/24 168 lb 6.4 oz (76.4 kg)  03/04/24 171 lb 6.4 oz (77.7 kg)   BP Readings from Last 3 Encounters:  08/08/24 (!) 158/64  07/07/24 (!) 140/76  06/04/24 126/78         Past Medical History:  Diagnosis Date   Anemia    Anxiety    Aortic stenosis, severe 03/09/2018              Reading Physician    Reading Date    Result Priority      Shlomo Wilbert SAUNDERS, MD [phone]    07/06/2021    Routine     Narrative & Impression       The study is normal. The study is low risk.    RBBB with repolarization changes was present at baseline with no ST changes from baseline during infusion.  Occsaional PVCs were noted.    LV perfusion is normal. There is no evidence of is   Arthritis    Basal cell carcinoma    Basal Cell X2 , Dr Shlomo   BPH (benign prostatic hyperplasia)    Bursitis of left hip    Dr. 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   s/p TAVR with a 29 mm Tedrick S3UR via the TF approach by Drs Wonda ODESSIA Fellers   Past Surgical History:  Procedure Laterality Date   COLONOSCOPY  2010   negative; [facility]   CYSTOSCOPY  10/2014   [facility]; neg   dislocation of right hip  2019   GREEN LIGHT LASER TURP (TRANSURETHRAL RESECTION OF PROSTATE  09/27/2012   Procedure: GREEN LIGHT LASER TURP (TRANSURETHRAL RESECTION OF PROSTATE;  Surgeon: Donnice Gwenyth Brooks, MD;  Location: [facility];  Service: Urology;  Laterality: N/A;      INTRAOPERATIVE TRANSTHORACIC ECHOCARDIOGRAM N/A 07/10/2023   Procedure: INTRAOPERATIVE TRANSTHORACIC ECHOCARDIOGRAM;  Surgeon: Wonda Sharper, MD;  Location: [facility] INVASIVE CV LAB;  Service: Cardiovascular;  Laterality: N/A;   JOINT REPLACEMENT Left Oct. 12, 2012   left total hip replacement by Dr. Liam   PACEMAKER IMPLANT N/A 07/11/2023   Procedure: PACEMAKER IMPLANT;  Surgeon: Cindie Ole DASEN, MD;  Location: [facility] INVASIVE CV LAB;  Service: Cardiovascular;  Laterality: N/A;   PROSTATE BIOPSY  2007 , 2009   X2 , Dr Terence  & Dr Andra   PROSTATE SURGERY     Laser   RIGHT/LEFT HEART CATH AND CORONARY ANGIOGRAPHY N/A 06/18/2023   Procedure: RIGHT/LEFT HEART CATH AND CORONARY ANGIOGRAPHY;  Surgeon: Wendel Lurena POUR, MD;  Location: [facility] INVASIVE CV LAB;  Service: Cardiovascular;  Laterality: N/A;   TEMPORARY PACEMAKER Right 07/10/2023   Procedure: TEMPORARY PACEMAKER;  Surgeon: Wonda Sharper, MD;  Location: [facility] INVASIVE CV LAB;  Service: Cardiovascular;  Laterality: Right;   TOTAL HIP ARTHROPLASTY Right 09/26/2017   TOTAL HIP ARTHROPLASTY Right 09/26/2017   Procedure: TOTAL HIP ARTHROPLASTY ANTERIOR APPROACH;  Surgeon: Liam Lerner, MD;  Location: [facility] OR;  Service: Orthopedics;  Laterality: Right;   TOTAL SHOULDER ARTHROPLASTY Right 08/17/2022   Procedure:  TOTAL SHOULDER ARTHROPLASTY WITH AUGMENTED GLENOID;  Surgeon: Dozier Soulier, MD;  Location: [facility];  Service: Orthopedics;  Laterality: Right;   TRANSCATHETER AORTIC VALVE REPLACEMENT,SUBCLAVIAN Left 07/10/2023   Procedure: Possible Transcatheter Aortic Valve Replacement-Left Subclavian;  Surgeon: Wonda Sharper, MD;  Location: [facility] INVASIVE CV LAB;  Service: Cardiovascular;  Laterality: Left;    reports that he quit smoking about 43 years ago. His smoking use included cigarettes. He has never used smokeless tobacco. He reports that he does not drink alcohol and does not use drugs. family history includes Arthritis in his mother; Bone cancer in his maternal grandmother; COPD in his father; Colon cancer (age of onset: 31) in his son; Heart attack (age of onset: 48) in his paternal grandmother; Heart disease in his mother; Hyperlipidemia in his mother; Hypertension in his mother; Stroke in his mother; Transient ischemic attack in his mother; Turner syndrome in his daughter; Vascular Disease in his mother. Allergies  Allergen Reactions   Demeclocycline Nausea Only    Nausea and constipation   Pravastatin  Other (See Comments)    myopathy   Lisinopril  Cough   Oxycodone      Mental confusion   Current Outpatient Medications on File Prior to Visit  Medication Sig Dispense Refill   amoxicillin  (AMOXIL ) 500 MG tablet Take 4 tablets (2000 mg) by mouth one hour prior to dental procedure. 4 tablet 3   aspirin  EC 81 MG tablet Take 81 mg by mouth daily.     cilostazol  (PLETAL ) 50 MG tablet TAKE 1 TABLET(50 MG) BY MOUTH TWICE DAILY 180 tablet 2   cyanocobalamin  (VITAMIN B12) 1000 MCG tablet Take 1,000 mcg by mouth daily.     famotidine (PEPCID) 20 MG tablet Take 20 mg by mouth daily as needed for heartburn or indigestion.     finasteride (PROSCAR) 5 MG tablet Take 5 mg by mouth daily.     fluocinonide -emollient (LIDEX -E) 0.05 % cream Apply 1 Application topically 2 (two) times daily. 60 g 1   fluticasone  (FLONASE) 50 MCG/ACT nasal spray Place 2 sprays into both nostrils daily.     hydrocortisone cream 1 % Apply 1 Application topically daily as needed for itching.     metoprolol  succinate (TOPROL -XL) 25 MG 24 hr tablet TAKE 1 TABLET(25 MG) BY MOUTH DAILY WITH OR IMMEDIATELY FOLLOWING A MEAL 90 tablet 3   Propylene Glycol (SYSTANE BALANCE) 0.6 % SOLN Place 1 drop into both eyes as needed (dry eyes).     rivaroxaban  (XARELTO ) 2.5 MG TABS tablet Take 1 tablet (2.5 mg total) by mouth 2 (two) times daily. 60 tablet  5   rosuvastatin  (CRESTOR ) 5 MG tablet TAKE 1 TABLET EVERY DAY 90 tablet 3   tamsulosin  (FLOMAX ) 0.4 MG CAPS capsule Take by mouth.     telmisartan  (MICARDIS ) 80 MG tablet Take 0.5 tablets (40 mg total) by mouth in the morning and at bedtime. 90 tablet 3   Tiotropium Bromide-Olodaterol (STIOLTO RESPIMAT ) 2.5-2.5 MCG/ACT AERS Inhale 1 puff into the lungs daily.     No current facility-administered medications on file prior to visit.        ROS:  All others reviewed and negative.  Objective        PE:  BP (!) 158/64 (BP Location: Right Arm, Patient Position: Sitting, Cuff Size: Normal)   Pulse 65   Temp 98 F (36.7 C) (Oral)   Ht 5' 7 (1.702 m)   Wt 169 lb (76.7 kg)   SpO2 99%   BMI 26.47 kg/m                 Constitutional: Pt appears in NAD               HENT: Head: NCAT.                Right Ear: External ear normal.                 Left Ear: External ear normal. Bilat tm's with mild erythema.  Max sinus areas mild tender.  Pharynx with mild erythema, no exudate               Eyes: . Pupils are equal, round, and reactive to light. Conjunctivae and EOM are normal               Nose: without d/c or deformity               Neck: Neck supple. Gross normal ROM               Cardiovascular: Normal rate and regular rhythm.                 Pulmonary/Chest: Effort normal and breath sounds without rales or wheezing.                               Neurological: Pt is alert. At baseline  orientation, motor grossly intact               Skin: Skin is warm. No rashes, no other new lesions, LE edema - none               Psychiatric: Pt behavior is normal without agitation   Micro: none  Cardiac tracings I have personally interpreted today:  none  Pertinent Radiological findings (summarize): none   Lab Results  Component Value Date   WBC 6.0 02/19/2024   HGB 13.3 02/19/2024   HCT 38.6 (L) 02/19/2024   PLT 268.0 02/19/2024   GLUCOSE 103 (H) 04/18/2024   CHOL 137 04/02/2024   TRIG 36.0 04/02/2024   HDL 66.50 04/02/2024   LDLCALC 63 04/02/2024   ALT 18 07/09/2023   AST 24 07/09/2023   NA 130 (L) 04/18/2024   K 4.2 04/18/2024   CL 97 04/18/2024   CREATININE 1.03 04/18/2024   BUN 16 04/18/2024   CO2 27 04/18/2024   TSH 1.90 02/19/2024   PSA 3.90 09/13/2023   INR 1.1 07/09/2023   HGBA1C 5.7 04/17/2023   Assessment/Plan:  Brian Hunt is  a 78 y.o. White or Caucasian [1] male with  has a past medical history of Anemia, Anxiety, Aortic stenosis, severe (03/09/2018), Arthritis, Basal cell carcinoma, BPH (benign prostatic hyperplasia), Bursitis of left hip, Carotid artery occlusion, Carotid stenosis (06/15/2021), Complication of anesthesia, COPD (chronic obstructive pulmonary disease) (HCC), Diverticulosis, Elevated PSA, Heart failure with mildly reduced ejection fraction (HFmrEF) (HCC) (06/08/2023), Hyperlipidemia, Hypertension, Internal hemorrhoids, OSA on CPAP, PAD (peripheral artery disease) (10/17/2021), Prostatitis, RBBB, and S/P TAVR (transcatheter aortic valve replacement) (07/10/2023).  Sinusitis Mild to mod, for antibx course, zpack  to f/u any worsening symptoms or concerns  Primary hypertension BP Readings from Last 3 Encounters:  08/08/24 (!) 158/64  07/07/24 (!) 140/76  06/04/24 126/78   Uncontrolled, likely reactive, pt states has low normal BP at home, pt to continue medical treatment toprol  xl 25 every day, micardis  80 mg qd   Allergic  rhinitis Mild to mod, for change flonase to nasacort asd due to recent nosebleeds,  to f/u any worsening symptoms or concerns  Followup: No follow-ups on file.  Lynwood Rush, MD 08/08/2024 7:17 PM [facility] - [facility]

## 2024-08-18 ENCOUNTER — Ambulatory Visit (INDEPENDENT_AMBULATORY_CARE_PROVIDER_SITE_OTHER): Admitting: Otolaryngology

## 2024-08-18 ENCOUNTER — Encounter (INDEPENDENT_AMBULATORY_CARE_PROVIDER_SITE_OTHER): Payer: Self-pay | Admitting: Otolaryngology

## 2024-08-18 VITALS — BP 133/73 | HR 67 | Temp 97.9°F | Ht 67.0 in | Wt 165.0 lb

## 2024-08-18 DIAGNOSIS — J310 Chronic rhinitis: Secondary | ICD-10-CM | POA: Diagnosis not present

## 2024-08-18 DIAGNOSIS — H6982 Other specified disorders of Eustachian tube, left ear: Secondary | ICD-10-CM

## 2024-08-18 DIAGNOSIS — H6123 Impacted cerumen, bilateral: Secondary | ICD-10-CM

## 2024-08-18 DIAGNOSIS — H903 Sensorineural hearing loss, bilateral: Secondary | ICD-10-CM | POA: Diagnosis not present

## 2024-08-18 NOTE — Progress Notes (Signed)
 Patient ID: Brian Hunt, male   DOB: 1946-03-01, 78 y.o.   MRN: [id number]  Follow-up: Chronic nasal congestion, hearing loss, eustachian tube dysfunction   History of Present Illness Brian Hunt is a 78 year old male with eustachian tube dysfunction who presents with persistent ear congestion and hearing loss.  He experiences persistent ear congestion and hearing loss, primarily in the left ear, despite previous treatments. He has a history of eustachian tube dysfunction and was previously prescribed Flonase nasal spray, which he used for a month and a half but discontinued due to epistaxis.  He visited his primary care doctor who prescribed cefdinir  and prednisone . He completed the prednisone  course but continues to experience congestion. He is unsure if there was an infection present.  He experiences significant frustration due to his inability to hear, which he describes as 'just can't hear.' He has not used hearing aids and is concerned about the rapid progression of his hearing loss. Changes in pressure, such as when getting into a car, exacerbate his symptoms.  He is aware that different nasal sprays can cause similar side effects, including epistaxis. He hears something moving around in his ears when performing the Valsalva maneuver.   Exam: General: Communicates without difficulty, well nourished, no acute distress. Head: Normocephalic, no evidence injury, no tenderness, facial buttresses intact without stepoff. Face/sinus: No tenderness to palpation and percussion. Facial movement is normal and symmetric. Eyes: PERRL, EOMI. No scleral icterus, conjunctivae clear. Neuro: CN II exam reveals vision grossly intact.  No nystagmus at any point of gaze. Ears: Auricles well formed without lesions.  Bilateral cerumen impaction.  Nose: External evaluation reveals normal support and skin without lesions.  Dorsum is intact.  Anterior rhinoscopy reveals congested mucosa over anterior aspect of  inferior turbinates and intact septum.  No purulence noted. Oral:  Oral cavity and oropharynx are intact, symmetric, without erythema or edema.  Mucosa is moist without lesions. Neck: Full range of motion without pain.  There is no significant lymphadenopathy.  No masses palpable.  Thyroid  bed within normal limits to palpation.  Parotid glands and submandibular glands equal bilaterally without mass.  Trachea is midline. Neuro:  CN 2-12 grossly intact.   Procedure: Bilateral cerumen disimpaction Anesthesia: None Description: Under the operating microscope, the cerumen is carefully removed with a combination of cerumen currette, alligator forceps, and suction catheters.  After the cerumen is removed, the TMs are noted to be normal.  No mass, erythema, or lesions. The patient tolerated the procedure well.       Assessment and Plan Assessment & Plan Left ear eustachian tube dysfunction Persistent left ear eustachian tube dysfunction with congestion and hearing difficulties. No fluid or infection present.  - Continue Flonase nasal spray, ensuring proper technique to avoid epistaxis. - Perform Valsalva maneuver 20-30 times daily to help open the eustachian tube. - Ordered hearing test to assess the extent of hearing loss and determine the need for hearing aids.  Chronic rhinitis with nasal mucosal congestion Chronic rhinitis with nasal mucosal congestion. Previous use of Flonase led to epistaxis due to improper technique. Alternative nasal sprays like Nasacort or Rhinocort discussed, with similar efficacy and side effects. - Continue Flonase nasal spray with proper technique to avoid epistaxis. - Consider alternative nasal sprays like Nasacort or Rhinocort if preferred.  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 Hearing aids considered as a potential intervention based on hearing test results. - Ordered hearing test to assess the extent of hearing  loss and determine the need for hearing aids.  Bilateral cerumen impaction. - Otomicroscopy with bilateral cerumen disimpaction.

## 2024-08-20 ENCOUNTER — Encounter: Payer: Self-pay | Admitting: Cardiovascular Disease

## 2024-08-20 ENCOUNTER — Ambulatory Visit: Attending: Cardiovascular Disease | Admitting: Cardiovascular Disease

## 2024-08-20 VITALS — BP 132/60 | HR 65 | Ht 67.0 in | Wt 168.0 lb

## 2024-08-20 DIAGNOSIS — I739 Peripheral vascular disease, unspecified: Secondary | ICD-10-CM | POA: Diagnosis not present

## 2024-08-20 DIAGNOSIS — I428 Other cardiomyopathies: Secondary | ICD-10-CM

## 2024-08-20 DIAGNOSIS — I6522 Occlusion and stenosis of left carotid artery: Secondary | ICD-10-CM

## 2024-08-20 NOTE — Progress Notes (Signed)
 08/20/2024 Brian Hunt   [birth year]  [id number]  Primary Physician Joshua Debby CROME, MD Primary Cardiologist: Dorn JINNY Lesches MD GENI C[place], [place]  HPI:  Brian Hunt is a 78 y.o.    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  He was referred by Dr. Raford, his cardiologist, for symptomatic claudication.  I last saw him in the office 08/27/2023.  His risk factors include 50 pack years of tobacco abuse having quit several years ago, treated hypertension and hyperlipidemia.  There is no family history for heart disease.  Is never had a heart attack or stroke.  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  He works out 3 times a week doing [employer].  He says that his calfs cause him discomfort when he walks and he has to stop periodically.  He had Doppler studies performed in our office 06/30/2021 revealing occluded SFAs bilaterally with a right ABI of 0.72 and a left of 0.66.  He also has bilateral carotid bruits   I began him on Pletal  50 mg p.o. twice daily which has afforded him significant improvement in his claudication symptoms.  He is not completely back to where he wants to be but is able to walk 2-3 times as long than previously before having to stop.  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   Since I saw him in the office a year ago he continues to do well.  He does have very mild claudication which does not appear to be lifestyle-limiting.  He denies chest pain but does have some shortness of breath.  He admits to having more energy since his TAVR procedure.  He remains on Pletal  and says he is not at the point where he needs to consider endovascular therapy at this time.  He does workout 3 hours a week in the gym  lifting weights and riding a recumbent bike.   Current Meds  Medication Sig   amoxicillin  (AMOXIL ) 500 MG tablet Take 4 tablets (2000 mg) by mouth one hour prior to dental procedure.   aspirin  EC 81 MG tablet Take 81 mg by mouth daily.   cefdinir  (OMNICEF ) 300 MG capsule Take 1 capsule (300 mg total) by mouth 2 (two) times daily.   cilostazol  (PLETAL ) 50 MG tablet TAKE 1 TABLET(50 MG) BY MOUTH TWICE DAILY   cyanocobalamin  (VITAMIN B12) 1000 MCG tablet Take 1,000 mcg by mouth daily.   famotidine (PEPCID) 20 MG tablet Take 20 mg by mouth daily as needed for heartburn or indigestion.   finasteride (PROSCAR) 5 MG tablet Take 5 mg by mouth daily.   fluocinonide -emollient (LIDEX -E) 0.05 % cream Apply 1 Application topically 2 (two) times daily.   fluticasone (FLONASE) 50 MCG/ACT nasal spray Place 2 sprays into both nostrils daily.   hydrocortisone cream 1 % Apply 1 Application topically daily as needed for itching.   metoprolol  succinate (TOPROL -XL) 25 MG 24 hr tablet TAKE 1 TABLET(25 MG) BY MOUTH DAILY WITH OR IMMEDIATELY FOLLOWING A MEAL   Propylene Glycol (SYSTANE BALANCE) 0.6 % SOLN Place 1 drop into both eyes as needed (dry eyes).   rivaroxaban  (XARELTO ) 2.5 MG TABS tablet Take 1 tablet (2.5 mg total) by mouth 2 (two) times daily.   rosuvastatin  (  CRESTOR ) 5 MG tablet TAKE 1 TABLET EVERY DAY   tamsulosin  (FLOMAX ) 0.4 MG CAPS capsule Take by mouth.   telmisartan  (MICARDIS ) 80 MG tablet Take 0.5 tablets (40 mg total) by mouth in the morning and at bedtime.   Tiotropium Bromide-Olodaterol (STIOLTO RESPIMAT ) 2.5-2.5 MCG/ACT AERS Inhale 1 puff into the lungs daily.     Allergies  Allergen Reactions   Demeclocycline Nausea Only    Nausea and constipation   Pravastatin  Other (See Comments)    myopathy   Lisinopril  Cough   Oxycodone      Mental confusion    Social History   Socioeconomic History   Marital status: Married    Spouse name: Particia   Number of children: 2   Years of  education: Not on file   Highest education level: Master's degree (e.g., MA, MS, MEng, MEd, MSW, MBA)  Occupational History   Occupation: retired  Tobacco Use   Smoking status: Former    Current packs/day: 0.00    Types: Cigarettes    Quit date: 06/14/1981    Years since quitting: 43.2   Smokeless tobacco: Never  Vaping Use   Vaping status: Never Used  Substance and Sexual Activity   Alcohol use: No    Comment: 2001- states no rehab   Drug use: No   Sexual activity: Yes  Other Topics Concern   Not on file  Social History Narrative      Lives with wife.    Are you right handed or left handed? Right   Are you currently employed ?    What is your current occupation? retired   Do you live at home alone?   Who lives with you? wife   What type of home do you live in: 1 story or 2 story? two    Caffiene 5 cups a day   Social Drivers of Corporate Investment Banker Strain: Low Risk  (07/05/2023)   Overall Financial Resource Strain (CARDIA)    Difficulty of Paying Living Expenses: Not hard at all  Food Insecurity: No Food Insecurity (07/05/2023)   Hunger Vital Sign    Worried About Running Out of Food in the Last Year: Never true    Ran Out of Food in the Last Year: Never true  Transportation Needs: No Transportation Needs (07/05/2023)   PRAPARE - Administrator, Civil Service (Medical): No    Lack of Transportation (Non-Medical): No  Physical Activity: Insufficiently Active (07/05/2023)   Exercise Vital Sign    Days of Exercise per Week: 1 day    Minutes of Exercise per Session: 60 min  Stress: No Stress Concern Present (07/05/2023)   [employer] of Occupational Health - Occupational Stress Questionnaire    Feeling of Stress : Not at all  Social Connections: Moderately Integrated (07/05/2023)   Social Connection and Isolation Panel    Frequency of Communication with Friends and Family: Three times a week    Frequency of Social Gatherings with Friends and  Family: Never    Attends Religious Services: Never    Database Administrator or Organizations: Yes    Attends Banker Meetings: Never    Marital Status: Married  Catering Manager Violence: Not At Risk (07/05/2023)   Humiliation, Afraid, Rape, and Kick questionnaire    Fear of Current or Ex-Partner: No    Emotionally Abused: No    Physically Abused: No    Sexually Abused: No     Review of Systems: General:  negative for chills, fever, night sweats or weight changes.  Cardiovascular: negative for chest pain, dyspnea on exertion, edema, orthopnea, palpitations, paroxysmal nocturnal dyspnea or shortness of breath Dermatological: negative for rash Respiratory: negative for cough or wheezing Urologic: negative for hematuria Abdominal: negative for nausea, vomiting, diarrhea, bright red blood per rectum, melena, or hematemesis Neurologic: negative for visual changes, syncope, or dizziness All other systems reviewed and are otherwise negative except as noted above.    Blood pressure 132/60, pulse 65, height 5' 7 (1.702 m), weight 168 lb (76.2 kg).  General appearance: alert and no distress Neck: no adenopathy, no carotid bruit, no JVD, supple, symmetrical, trachea midline, and thyroid  not enlarged, symmetric, no tenderness/mass/nodules Lungs: clear to auscultation bilaterally Heart: regular rate and rhythm, S1, S2 normal, no murmur, click, rub or gallop Extremities: extremities normal, atraumatic, no cyanosis or edema Pulses: 2+ and symmetric Skin: Skin color, texture, turgor normal. No rashes or lesions Neurologic: Grossly normal  EKG EKG Interpretation Date/Time:  Wednesday August 20 2024 08:56:53 EST Ventricular Rate:  65 PR Interval:  152 QRS Duration:  138 QT Interval:  432 QTC Calculation: 449 R Axis:   46  Text Interpretation: Sinus rhythm with Premature atrial complexes Right bundle branch block When compared with ECG of 12-Jul-2023 07:02, Premature atrial  complexes are now Present T wave inversion less evident in Anterior leads Confirmed by Court Carrier [phone]) on 08/20/2024 9:11:02 AM    ASSESSMENT AND PLAN:   PAD (peripheral artery disease) History of peripheral arterial disease with Dopplers that show ABIs in the mid 6 range bilaterally with what appears to be occluded SFAs.  His iliac arteries appear patent.  He does have claudication which has markedly improved with the addition of Pletal .  He is not at the point where he wishes endovascular therapy.  Will continue to monitor noninvasively.  Carotid stenosis History of moderate left ICA stenosis by duplex ultrasound most recently performed 10/02/2023.  This was confirmed with CTA.  He is not at that point where he needs vascular surgical referral.  Will continue to monitor on an annual basis.     Carrier DOROTHA Court MD FACP,FACC,FAHA, [facility] 08/20/2024 9:28 AM

## 2024-08-20 NOTE — Assessment & Plan Note (Signed)
 History of peripheral arterial disease with Dopplers that show ABIs in the mid 6 range bilaterally with what appears to be occluded SFAs.  His iliac arteries appear patent.  He does have claudication which has markedly improved with the addition of Pletal .  He is not at the point where he wishes endovascular therapy.  Will continue to monitor noninvasively.

## 2024-08-20 NOTE — Assessment & Plan Note (Signed)
 History of moderate left ICA stenosis by duplex ultrasound most recently performed 10/02/2023.  This was confirmed with CTA.  He is not at that point where he needs vascular surgical referral.  Will continue to monitor on an annual basis.

## 2024-08-20 NOTE — Patient Instructions (Signed)
 Medication Instructions:  Your physician recommends that you continue on your current medications as directed. Please refer to the Current Medication list given to you today.  *If you need a refill on your cardiac medications before your next appointment, please call your pharmacy*  Testing/Procedures: Your physician has requested that you have a carotid duplex. This test is an ultrasound of the carotid arteries in your neck. It looks at blood flow through these arteries that supply the brain with blood. Allow one hour for this exam. There are no restrictions or special instructions. This will take place at [address], 4th floor  **To do in January**  Please note: We ask at that you not bring children with you during ultrasound (echo/ vascular) testing. Due to room size and safety concerns, children are not allowed in the ultrasound rooms during exams. Our front office staff cannot provide observation of children in our lobby area while testing is being conducted. An adult accompanying a patient to their appointment will only be allowed in the ultrasound room at the discretion of the ultrasound technician under special circumstances. We apologize for any inconvenience.  Your physician has requested that you have an Aorta/Iliac Duplex. This will take place at [address] [facility], 4th floor  No food after 11PM the night before.  Water  is OK. (Don't drink liquids if you have been instructed not to for ANOTHER test) Avoid foods that produce bowel gas, for 24 hours prior to exam (see below). No breakfast, no chewing gum, no smoking or carbonated beverages. Patient may take morning medications with water . Come in for test at least 15 minutes early to register.  **To do in January**  Please note: We ask at that you not bring children with you during ultrasound (echo/ vascular) testing. Due to room size and safety concerns, children are not allowed in the ultrasound rooms during exams. Our front office  staff cannot provide observation of children in our lobby area while testing is being conducted. An adult accompanying a patient to their appointment will only be allowed in the ultrasound room at the discretion of the ultrasound technician under special circumstances. We apologize for any inconvenience.  Your physician has requested that you have a lower extremity arterial duplex. During this test, ultrasound is used to evaluate arterial blood flow in the legs. Allow one hour for this exam. There are no restrictions or special instructions. This will take place at [address], 4th floor   **To do in January**  Please note: We ask at that you not bring children with you during ultrasound (echo/ vascular) testing. Due to room size and safety concerns, children are not allowed in the ultrasound rooms during exams. Our front office staff cannot provide observation of children in our lobby area while testing is being conducted. An adult accompanying a patient to their appointment will only be allowed in the ultrasound room at the discretion of the ultrasound technician under special circumstances. We apologize for any inconvenience.  Your physician has requested that you have an ankle brachial index (ABI). During this test an ultrasound and blood pressure cuff are used to evaluate the arteries that supply the arms and legs with blood. Allow thirty minutes for this exam. There are no restrictions or special instructions. This will take place at [address], 4th floor   **To do in January**   Please note: We ask at that you not bring children with you during ultrasound (echo/ vascular) testing. Due to room size  and safety concerns, children are not allowed in the ultrasound rooms during exams. Our front office staff cannot provide observation of children in our lobby area while testing is being conducted. An adult accompanying a patient to their appointment will only be allowed in the ultrasound room at  the discretion of the ultrasound technician under special circumstances. We apologize for any inconvenience.   Follow-Up: At [facility], you and your health needs are our priority.  As part of our continuing mission to provide you with exceptional heart care, our providers are all part of one team.  This team includes your primary Cardiologist (physician) and Advanced Practice Providers or APPs (Physician Assistants and Nurse Practitioners) who all work together to provide you with the care you need, when you need it.  Your next appointment:   12 month(s) (PV only)  Provider:   Dorn Lesches, MD

## 2024-08-22 DIAGNOSIS — H6993 Unspecified Eustachian tube disorder, bilateral: Secondary | ICD-10-CM | POA: Insufficient documentation

## 2024-08-26 ENCOUNTER — Other Ambulatory Visit (HOSPITAL_BASED_OUTPATIENT_CLINIC_OR_DEPARTMENT_OTHER): Payer: Self-pay | Admitting: Cardiovascular Disease

## 2024-08-28 ENCOUNTER — Other Ambulatory Visit: Payer: Self-pay | Admitting: Cardiovascular Disease

## 2024-09-08 ENCOUNTER — Other Ambulatory Visit: Payer: Self-pay | Admitting: Cardiovascular Disease

## 2024-09-08 DIAGNOSIS — I10 Essential (primary) hypertension: Secondary | ICD-10-CM

## 2024-09-30 ENCOUNTER — Telehealth: Payer: Self-pay

## 2024-09-30 ENCOUNTER — Encounter: Payer: Self-pay | Admitting: Cardiology

## 2024-09-30 ENCOUNTER — Ambulatory Visit: Attending: Cardiology | Admitting: Cardiology

## 2024-09-30 VITALS — BP 130/70 | HR 80 | Ht 67.0 in | Wt 169.8 lb

## 2024-09-30 DIAGNOSIS — G4733 Obstructive sleep apnea (adult) (pediatric): Secondary | ICD-10-CM

## 2024-09-30 DIAGNOSIS — G4734 Idiopathic sleep related nonobstructive alveolar hypoventilation: Secondary | ICD-10-CM

## 2024-09-30 DIAGNOSIS — I10 Essential (primary) hypertension: Secondary | ICD-10-CM | POA: Diagnosis not present

## 2024-09-30 DIAGNOSIS — J449 Chronic obstructive pulmonary disease, unspecified: Secondary | ICD-10-CM | POA: Diagnosis not present

## 2024-09-30 NOTE — Addendum Note (Signed)
 Addended by: JANIT GENI CROME on: 09/30/2024 10:05 AM   Modules accepted: Orders

## 2024-09-30 NOTE — Progress Notes (Signed)
 "    Sleep Medicine Note    Date:  09/30/2024   ID:  Brian Hunt, DOB 01/17/1946, MRN [id number]  PCP:  Brian Debby CROME, MD  Cardiologist: Annabella Scarce, MD  No chief complaint on file.   History of Present Illness:  Brian Hunt is a 79 y.o. male with a history of hypertension, hyperlipidemia, COPD, carotid artery stenosis who was seen by Dr. Scarce in January 2024 and complained of problems sleeping for only 4 to 5 hours a night associated with nocturia up to 6 times a night.  He has been having difficulties staying asleep and occasionally snores if he sleeping on his back.  He has also been having problems with sleepiness during the day and occasion will catch himself drifting off to sleep.  He has been sleeping in a recliner as well.  He says right after he eats most meals he will go and sit in his chair and fell asleep.  He underwent home sleep study which showed moderate obstructive sleep apnea with an AHI of 23.9/h with lowest O2 saturation 68% with O2 saturations less than 88% for only 3.9 minutes.  He was started on auto CPAP from 4 to 15 cm H2O. due to persistent nocturnal hypoxemia on CPAP as documented on ONO he was started on O2 at 2 L via CPAP nightly.  He cannot remember if the ONO that he was supposed to repeat on CPAP and O2 was actually was on O2.  The second ONO did not show and further O2 desats.  He did not like the O2 tank in his room and sent all the O2 back.He is back for follow-up today.  He is doing well with his PAP device and thinks that he has gotten used to it.  He tolerates the mask and feels the pressure is adequate.  Since going on PAP he feels rested in the am if he sleeps well at night but does nap during the evening when watching TV.  He denies any significant mouth or nasal dryness. He has a lot of mucous in his nose and chest.  He does not think that he snores. An Epworth Sleepiness Scale score was calculated the office today and this endorsed at 11  arguing for some mild residual daytime sleepiness. Patient denies any episodes of bruxism, restless legs, No hypnogognic hallucinations or cataplectic events.    Past Medical History:  Diagnosis Date   Anemia    Anxiety    Aortic stenosis, severe 03/09/2018              Reading Physician    Reading Date    Result Priority      Shlomo Wilbert SAUNDERS, MD [phone]    07/06/2021    Routine     Narrative & Impression       The study is normal. The study is low risk.    RBBB with repolarization changes was present at baseline with no ST changes from baseline during infusion.  Occsaional PVCs were noted.    LV perfusion is normal. There is no evidence of is   Arthritis    Basal cell carcinoma    Basal Cell X2 , Dr Shlomo   BPH (benign prostatic hyperplasia)    Bursitis of left hip    Dr. 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   s/p TAVR with a 29 mm Ching S3UR via the TF approach by Drs Wonda ODESSIA Fellers    Past Surgical History:  Procedure Laterality Date   COLONOSCOPY  2010   negative; [facility]   CYSTOSCOPY  10/2014   [facility]; neg   dislocation of right hip  2019   GREEN LIGHT LASER TURP (TRANSURETHRAL RESECTION OF PROSTATE  09/27/2012   Procedure: GREEN LIGHT LASER TURP (TRANSURETHRAL RESECTION OF PROSTATE;  Surgeon: Donnice Gwenyth Brooks, MD;  Location: [facility];  Service: Urology;  Laterality: N/A;      INTRAOPERATIVE TRANSTHORACIC ECHOCARDIOGRAM N/A 07/10/2023    Procedure: INTRAOPERATIVE TRANSTHORACIC ECHOCARDIOGRAM;  Surgeon: Wonda Sharper, MD;  Location: [facility] INVASIVE CV LAB;  Service: Cardiovascular;  Laterality: N/A;   JOINT REPLACEMENT Left Oct. 12, 2012   left total hip replacement by Dr. Liam   PACEMAKER IMPLANT N/A 07/11/2023   Procedure: PACEMAKER IMPLANT;  Surgeon: Cindie Ole DASEN, MD;  Location: [facility] INVASIVE CV LAB;  Service: Cardiovascular;  Laterality: N/A;   PROSTATE BIOPSY  2007 , 2009   X2 , Dr Terence  & Dr Andra   PROSTATE SURGERY     Laser   RIGHT/LEFT HEART CATH AND CORONARY ANGIOGRAPHY N/A 06/18/2023   Procedure: RIGHT/LEFT HEART CATH AND CORONARY ANGIOGRAPHY;  Surgeon: Wendel Lurena POUR, MD;  Location: [facility] INVASIVE CV LAB;  Service: Cardiovascular;  Laterality: N/A;   TEMPORARY PACEMAKER Right 07/10/2023   Procedure: TEMPORARY PACEMAKER;  Surgeon: Wonda Sharper, MD;  Location: [facility] INVASIVE CV LAB;  Service: Cardiovascular;  Laterality: Right;   TOTAL HIP ARTHROPLASTY Right 09/26/2017   TOTAL HIP ARTHROPLASTY Right 09/26/2017   Procedure: TOTAL HIP ARTHROPLASTY ANTERIOR APPROACH;  Surgeon: Liam Lerner, MD;  Location: [facility] OR;  Service: Orthopedics;  Laterality: Right;   TOTAL SHOULDER ARTHROPLASTY Right 08/17/2022   Procedure: TOTAL SHOULDER ARTHROPLASTY WITH AUGMENTED GLENOID;  Surgeon: Dozier Soulier, MD;  Location: [facility];  Service: Orthopedics;  Laterality: Right;   TRANSCATHETER AORTIC VALVE REPLACEMENT,SUBCLAVIAN Left 07/10/2023   Procedure: Possible Transcatheter Aortic Valve Replacement-Left Subclavian;  Surgeon: Wonda Sharper, MD;  Location: [facility] INVASIVE CV LAB;  Service: Cardiovascular;  Laterality: Left;    Current Medications: Current Meds  Medication Sig   amoxicillin  (AMOXIL ) 500 MG tablet Take 4 tablets (2000 mg) by mouth one hour prior to dental procedure.   aspirin  EC 81 MG tablet Take 81 mg by mouth daily.   cefdinir  (OMNICEF ) 300 MG capsule Take 1 capsule (300 mg total) by mouth 2 (two) times daily.    cilostazol  (PLETAL ) 50 MG tablet TAKE 1 TABLET(50 MG) BY MOUTH TWICE DAILY   cyanocobalamin  (VITAMIN B12) 1000 MCG tablet Take 1,000 mcg by mouth daily.   famotidine (PEPCID) 20 MG tablet Take 20 mg by mouth daily as needed for heartburn or indigestion.   finasteride (PROSCAR) 5 MG tablet Take 5 mg by mouth daily.   fluocinonide -emollient (LIDEX -E) 0.05 % cream Apply 1 Application topically 2 (two) times daily.   hydrocortisone cream 1 % Apply 1 Application topically daily as needed for itching.  metoprolol  succinate (TOPROL -XL) 25 MG 24 hr tablet TAKE 1 TABLET(25 MG) BY MOUTH DAILY WITH OR IMMEDIATELY FOLLOWING A MEAL   Propylene Glycol (SYSTANE BALANCE) 0.6 % SOLN Place 1 drop into both eyes as needed (dry eyes).   rivaroxaban  (XARELTO ) 2.5 MG TABS tablet TAKE 1 TABLET(2.5 MG) BY MOUTH TWICE DAILY   rosuvastatin  (CRESTOR ) 5 MG tablet TAKE 1 TABLET EVERY DAY   tamsulosin  (FLOMAX ) 0.4 MG CAPS capsule Take by mouth.   telmisartan  (MICARDIS ) 80 MG tablet Take 0.5 tablets (40 mg total) by mouth in the morning and at bedtime.    Allergies:   Demeclocycline, Pravastatin , Lisinopril , and Oxycodone    Social History   Socioeconomic History   Marital status: Married    Spouse name: Particia   Number of children: 2   Years of education: Not on file   Highest education level: Master's degree (e.g., MA, MS, MEng, MEd, MSW, MBA)  Occupational History   Occupation: retired  Tobacco Use   Smoking status: Former    Current packs/day: 0.00    Types: Cigarettes    Quit date: 06/14/1981    Years since quitting: 43.3   Smokeless tobacco: Never  Vaping Use   Vaping status: Never Used  Substance and Sexual Activity   Alcohol use: No    Comment: 2001- states no rehab   Drug use: No   Sexual activity: Yes  Other Topics Concern   Not on file  Social History Narrative      Lives with wife.    Are you right handed or left handed? Right   Are you currently employed ?    What is your current  occupation? retired   Do you live at home alone?   Who lives with you? wife   What type of home do you live in: 1 story or 2 story? two    Caffiene 5 cups a day   Social Drivers of Health   Tobacco Use: Medium Risk (09/30/2024)   Patient History    Smoking Tobacco Use: Former    Smokeless Tobacco Use: Never    Passive Exposure: Not on file  Financial Resource Strain: Low Risk (07/05/2023)   Overall Financial Resource Strain (CARDIA)    Difficulty of Paying Living Expenses: Not hard at all  Food Insecurity: Low Risk (09/26/2024)   Received from [facility]   Epic    Within the past 12 months, you worried that your food would run out before you got money to buy more: Never true    Within the past 12 months, the food you bought just didn't last and you didn't have money to get more. : Never true  Transportation Needs: No Transportation Needs (09/26/2024)   Received from [facility]    In the past 12 months, has lack of reliable transportation kept you from medical appointments, meetings, work or from getting things needed for daily living? : No  Physical Activity: Insufficiently Active (07/05/2023)   Exercise Vital Sign    Days of Exercise per Week: 1 day    Minutes of Exercise per Session: 60 min  Stress: No Stress Concern Present (07/05/2023)   [employer] of Occupational Health - Occupational Stress Questionnaire    Feeling of Stress : Not at all  Social Connections: Moderately Integrated (07/05/2023)   Social Connection and Isolation Panel    Frequency of Communication with Friends and Family: Three times a week    Frequency of Social Gatherings with Friends and  Family: Never    Attends Religious Services: Never    Active Member of Clubs or Organizations: Yes    Attends Banker Meetings: Never    Marital Status: Married  Depression (PHQ2-9): Low Risk (08/08/2024)   Depression (PHQ2-9)    PHQ-2 Score: 0  Alcohol Screen: Low Risk  (07/05/2023)   Alcohol Screen    Last Alcohol Screening Score (AUDIT): 0  Housing: Low Risk (09/26/2024)   Received from [facility]   Epic    What is your living situation today?: I have a steady place to live    Think about the place you live. Do you have problems with any of the following? Choose all that apply:: None/None on this list  Utilities: Low Risk (09/26/2024)   Received from [facility]   Utilities    In the past 12 months has the electric, gas, oil, or water  company threatened to shut off services in your home? : No  Health Literacy: Adequate Health Literacy (07/05/2023)   B1300 Health Literacy    Frequency of need for help with medical instructions: Never     Family History:  The patient's family history includes Arthritis in his mother; Bone cancer in his maternal grandmother; COPD in his father; Colon cancer (age of onset: 64) in his son; Heart attack (age of onset: 56) in his paternal grandmother; Heart disease in his mother; Hyperlipidemia in his mother; Hypertension in his mother; Stroke in his mother; Transient ischemic attack in his mother; Kaleyah Labreck syndrome in his daughter; Vascular Disease in his mother.   ROS:   Please see the history of present illness.    ROS All other systems reviewed and are negative.      No data to display             PHYSICAL EXAM:   VS:  BP 130/70   Pulse 80   Ht 5' 7 (1.702 m)   Wt 169 lb 12.8 oz (77 kg)   SpO2 92%   BMI 26.59 kg/m    GEN: Well nourished, well developed in no acute distress HEENT: Normal NECK: No JVD; No carotid bruits LYMPHATICS: No lymphadenopathy CARDIAC:RRR, no murmurs, rubs, gallops RESPIRATORY:  Clear to auscultation without rales, wheezing or rhonchi  ABDOMEN: Soft, non-tender, non-distended MUSCULOSKELETAL:  No edema; No deformity  SKIN: Warm and dry NEUROLOGIC:  Alert and oriented x 3 PSYCHIATRIC:  Normal affect   Wt Readings from Last 3 Encounters:  09/30/24 169 lb 12.8 oz (77 kg)   08/20/24 168 lb (76.2 kg)  08/18/24 165 lb (74.8 kg)      Studies/Labs Reviewed:   Home sleep study and PAP compliance download  Recent Labs: 02/19/2024: Hemoglobin 13.3; Magnesium  2.0; Platelets 268.0; TSH 1.90 04/18/2024: BUN 16; Creatinine, Ser 1.03; Potassium 4.2; Sodium 130    ASSESSMENT:    1. OSA (obstructive sleep apnea)   2. Nocturnal hypoxemia   3. Primary hypertension       PLAN:  In order of problems listed above:  OSA - The patient is tolerating PAP therapy well without any problems. The PAP download performed by his DME was personally reviewed and interpreted by me today and showed an AHI of 1.3 /hr on auto CPAP 4-15 cm H2O with 100% compliance in using more than 4 hours nightly.  The patient has been using and benefiting from PAP use and will continue to benefit from therapy.   Nocturnal hypoxemia -Currently on CPAP only as he did not like the  O2 tank in his room and he sent the O2 back -Repeat ONO on CPAP to see if he needs supplemental O2 -suspect that his COPD may be contributing to his nocturnal hypoxemia.  His O2 sat today is 92% on RA -refer back to Pulmonary  Hypertension -BP controlled on exam today - Continue telmisartan  40 mg daily and Toprol -XL 25 mg daily with as needed refills  Followup with me in 1 year  Time Spent: 20 minutes total time of encounter, including 15 minutes spent in face-to-face patient care on the date of this encounter. This time includes coordination of care and counseling regarding above mentioned problem list. Remainder of non-face-to-face time involved reviewing chart documents/testing relevant to the patient encounter and documentation in the medical record. I have independently reviewed documentation from referring provider  Medication Adjustments/Labs and Tests Ordered: Current medicines are reviewed at length with the patient today.  Concerns regarding medicines are outlined above.  Medication changes, Labs and Tests  ordered today are listed in the Patient Instructions below.  There are no Patient Instructions on file for this visit.   Signed, Wilbert Bihari, MD  09/30/2024 10:00 AM    [facility] [address] [place], [place], [place]  [address] Phone: [phone]; Fax: [phone]   "

## 2024-09-30 NOTE — Patient Instructions (Signed)
 Medication Instructions:  Your physician recommends that you continue on your current medications as directed. Please refer to the Current Medication list given to you today.  *If you need a refill on your cardiac medications before your next appointment, please call your pharmacy*  Lab Work: None.  If you have labs (blood work) drawn today and your tests are completely normal, you will receive your results only by: MyChart Message (if you have MyChart) OR A paper copy in the mail If you have any lab test that is abnormal or we need to change your treatment, we will call you to review the results.  Testing/Procedures: Your physician has recommended that you have a overnight oximetry study. Someone from your DME company will call you to set this up.  Follow-Up: At [facility], you and your health needs are our priority.  As part of our continuing mission to provide you with exceptional heart care, our providers are all part of one team.  This team includes your primary Cardiologist (physician) and Advanced Practice Providers or APPs (Physician Assistants and Nurse Practitioners) who all work together to provide you with the care you need, when you need it.  Your next appointment will be dependent on the results of your overnight oximetry study and it will be with:     Provider:   Dr. Wilbert Bihari, MD   We recommend signing up for the patient portal called MyChart.  Sign up information is provided on this After Visit Summary.  MyChart is used to connect with patients for Virtual Visits (Telemedicine).  Patients are able to view lab/test results, encounter notes, upcoming appointments, etc.  Non-urgent messages can be sent to your provider as well.   To learn more about what you can do with MyChart, go to [email or web address].   Other Instructions Dr. Bihari has referred you to pulmonology. Someone from that office should call you to set up an appointment.

## 2024-09-30 NOTE — Telephone Encounter (Signed)
 SABRA

## 2024-10-02 ENCOUNTER — Encounter (HOSPITAL_BASED_OUTPATIENT_CLINIC_OR_DEPARTMENT_OTHER): Payer: Self-pay | Admitting: Cardiovascular Disease

## 2024-10-02 ENCOUNTER — Ambulatory Visit (HOSPITAL_BASED_OUTPATIENT_CLINIC_OR_DEPARTMENT_OTHER): Admitting: Cardiovascular Disease

## 2024-10-02 VITALS — BP 126/68 | HR 70 | Ht 67.0 in | Wt 170.8 lb

## 2024-10-02 DIAGNOSIS — I10 Essential (primary) hypertension: Secondary | ICD-10-CM | POA: Diagnosis not present

## 2024-10-02 DIAGNOSIS — I5020 Unspecified systolic (congestive) heart failure: Secondary | ICD-10-CM

## 2024-10-02 DIAGNOSIS — Z5181 Encounter for therapeutic drug level monitoring: Secondary | ICD-10-CM | POA: Diagnosis not present

## 2024-10-02 DIAGNOSIS — F329 Major depressive disorder, single episode, unspecified: Secondary | ICD-10-CM | POA: Insufficient documentation

## 2024-10-02 DIAGNOSIS — I6523 Occlusion and stenosis of bilateral carotid arteries: Secondary | ICD-10-CM | POA: Diagnosis not present

## 2024-10-02 DIAGNOSIS — F331 Major depressive disorder, recurrent, moderate: Secondary | ICD-10-CM | POA: Diagnosis not present

## 2024-10-02 DIAGNOSIS — E785 Hyperlipidemia, unspecified: Secondary | ICD-10-CM | POA: Diagnosis not present

## 2024-10-02 DIAGNOSIS — I350 Nonrheumatic aortic (valve) stenosis: Secondary | ICD-10-CM

## 2024-10-02 DIAGNOSIS — I442 Atrioventricular block, complete: Secondary | ICD-10-CM

## 2024-10-02 DIAGNOSIS — I739 Peripheral vascular disease, unspecified: Secondary | ICD-10-CM | POA: Diagnosis not present

## 2024-10-02 MED ORDER — ROSUVASTATIN CALCIUM 10 MG PO TABS: 10.0000 mg | ORAL_TABLET | Freq: Every day | ORAL | 3 refills | Status: DC

## 2024-10-02 MED ORDER — ESCITALOPRAM OXALATE 5 MG PO TABS: 5.0000 mg | ORAL_TABLET | Freq: Every day | ORAL | 1 refills | Status: AC

## 2024-10-02 NOTE — Patient Instructions (Signed)
 Medication Instructions:  INCREASE YOUR ROSUVASTATIN  TO 10 MG DAILY   START LEXAPRO  5 MG DAILY ADDITIONAL REFILLS WILL NEED TO COME FROM YOUR PRIMARY CARE   *If you need a refill on your cardiac medications before your next appointment, please call your pharmacy*  Lab Work: FASTING LIPID/CMET IN 3 MONTHS   If you have labs (blood work) drawn today and your tests are completely normal, you will receive your results only by: MyChart Message (if you have MyChart) OR A paper copy in the mail If you have any lab test that is abnormal or we need to change your treatment, we will call you to review the results.  Testing/Procedures: NONE   Follow-Up: At [facility], you and your health needs are our priority.  As part of our continuing mission to provide you with exceptional heart care, our providers are all part of one team.  This team includes your primary Cardiologist (physician) and Advanced Practice Providers or APPs (Physician Assistants and Nurse Practitioners) who all work together to provide you with the care you need, when you need it.  Your next appointment:   4 month(s)  Provider:   Annabella Scarce, MD, Rosaline Bane, NP, or Reche Finder, NP    We recommend signing up for the patient portal called MyChart.  Sign up information is provided on this After Visit Summary.  MyChart is used to connect with patients for Virtual Visits (Telemedicine).  Patients are able to view lab/test results, encounter notes, upcoming appointments, etc.  Non-urgent messages can be sent to your provider as well.   To learn more about what you can do with MyChart, go to [email or web address].   Other Instructions

## 2024-10-02 NOTE — Progress Notes (Signed)
 " Cardiology Office Note:  .   Date:  10/02/2024  ID:  Brian Hunt, DOB [birth year], MRN [id number] PCP: Joshua Debby CROME, MD  [facility] Providers Cardiologist:  Annabella Scarce, MD Electrophysiologist:  OLE ONEIDA HOLTS, MD (Inactive)  Structural Heart:  Ozell Fell, MD Sleep Medicine:  Wilbert Bihari, MD    History of Present Illness: .   Brian Hunt is a 79 y.o. male with a hx of OSA, severe aortic stenosis s/p TAVR, anemia, anxiety, arthritis, basal cell carcinoma, BPH, carotid artery occlusion, complication of anesthesia (vasal vagal reaction), COPD, diverticulosis, DOE, hyperlipidemia, hypertension, RBBB, OSA, here for follow-up. He saw Dr. Joshua 79/2022, and his blood pressure was 136/78. He reported that his blood pressure had been well controlled. Mild aortic stenosis was also noted and he was referred to cardiology. He previously had an echo 02/2018 with LVEF 65-70%, grade 1 diastolic dysfunction, and mild stenosis with a mean gradient of 13 mm HG.   He complained of bilateral LE pain. He had ABIs 06/2021 that revealed moderate disease bilaterally. Arterial dopplers showed 30-49% stenosis in the L common femoral and total occlusion of the L SFA. There was irregular plaque on the right that could not be quantified. Carotid dopplers showed moderate stenosis in the bilateral ICAs. Echo 06/2021 revealed LVEF 60-65% and mild aortic stenosis. He was referred to Dr. Court and was started on Pletal  07/2021. They made plans to re-evaluate for angiography in 3 months. He stopped smoking 06/2021.    In 09/2021, his blood pressure continued to be labile, but it averaged in the 130s/70s and was controlled in the office. He continued exercising regularly. He continues to follow up with Dr. Court for PAD. When he last saw him 04/2022 he was doing well and he had some mild claudication. They discussed peripheral angiography and he wanted to think about it.   At his visit 09/2022, his blood  pressure was high in the office but consistently well controlled at home. He expressed concerns with insomnia, shortness of breath/orthopnea since his shoulder replacement surgery 6 weeks prior. He also had poor air movement on exam and was referred to pulmonary. He had an echo 09/2022 showing moderate to severe aortic stenosis (was mild in 2022). He completed an [place] sleep study revealing moderate obstructive sleep apnea and was recommended for CPAP. Repeat echo 05/2023 showed LVEF 45-50%, left ventricular global hypokinesis, mild LVH, and severe aortic stenosis.  He was referred for TAVR and had a 29mm Yeary Ultra THV implanted 06/2023.  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  Post-procedure echo revealed LVEF 60-65% with a well-functioning TAVR.  He saw Reche Finder, NP 10/2023 and noted intermitted dizziness upon standing.    On 02/2024 he noted elevated BP in the AM to the 160s then dropping to 110s by the afternoon.  Telmisartan  was switched to bid.  Xarelto  2.5mg  was added for his PAD.  He saw Dr. Court for claudication that he felt was mildly limiting. He recommended continued monitoring given his symptomatic improvement on Pletal  and continued monitoring of his L ICA stenosis.    Discussed the use of AI scribe software for clinical note transcription with the patient, who gave verbal consent to proceed.  History of Present Illness Brian Hunt has been experiencing congestion since late September, which he believes has led to a clogged ear. He also mentions shortness of breath, which he attributes to the congestion draining down his throat.  Despite these symptoms, he continues to exercise regularly.  He has no exertional chest pain or pressure.   His blood pressure has stabilized since adjusting his telmisartan  dosage, averaging around 120-130/70 mmHg. He experiences occasional tiredness and has noticed brief episodes of a 'funny feeling' in his chest,  typically at night, lasting only seconds without associated dizziness or lightheadedness.  He reports improvement in leg discomfort, allowing him to walk further without pain. He is considering discussing potential interventions for his leg arteries.  He is currently taking 5 mg of rosuvastatin  and has experienced muscle cramps with higher doses in the past. He is open to adjusting his medication to manage his cholesterol levels better.  He expresses symptoms of depression, including lack of motivation, tenseness, and apprehension about minor tasks. He has previously experienced depression but is finding it difficult to manage currently. He is considering medication to help with these symptoms.  No issues with swelling. No lightheadedness or dizziness associated with his chest sensations.   ROS:  As per HPI  Studies Reviewed: .       Echo 06/2024:  1. Left ventricular ejection fraction, by estimation, is 60 to 65%. Left  ventricular ejection fraction by 3D volume is 64 %. The left ventricle has  normal function. The left ventricle has no regional wall motion  abnormalities. There is mild left  ventricular hypertrophy of the basal-septal segment. Left ventricular  diastolic parameters are consistent with Grade I diastolic dysfunction  (impaired relaxation). Elevated left atrial pressure.   2. Right ventricular systolic function is normal. The right ventricular  size is normal. There is normal pulmonary artery systolic pressure. The  estimated right ventricular systolic pressure is 27.8 mmHg.   3. The mitral valve is degenerative. Trivial mitral valve regurgitation.  No evidence of mitral stenosis. Severe mitral annular calcification.   4. The aortic valve has been repaired/replaced. Mild perivalvular AI  noted at 10 o'clock and 6 o'clock in the PSAX views. No aortic stenosis is  present. There is a 29 mm Sapien prosthetic (TAVR) valve present in the  aortic position. Procedure Date:   07/10/2023. Aortic regurgitation PHT measures 784 msec. Aortic valve area,  by VTI measures 2.07 cm. Aortic valve mean gradient measures 7.0 mmHg.  Aortic valve Vmax measures 1.93 m/s. DI 0.73.   5. The inferior vena cava is normal in size with greater than 50%  respiratory variability, suggesting right atrial pressure of 3 mmHg.   Risk Assessment/Calculations:         STOP-Bang Score:         Physical Exam:   VS:  BP 126/68   Pulse 70   Ht 5' 7 (1.702 m)   Wt 170 lb 12.8 oz (77.5 kg)   SpO2 99%   BMI 26.75 kg/m  , BMI Body mass index is 26.75 kg/m. GENERAL:  Well appearing HEENT: Pupils equal round and reactive, fundi not visualized, oral mucosa unremarkable NECK:  No jugular venous distention, waveform within normal limits, carotid upstroke brisk and symmetric, no bruits, no thyromegaly LUNGS:  Clear to auscultation bilaterally HEART:  RRR.  PMI not displaced or sustained,S1 and S2 within normal limits, no S3, no S4, no clicks, no rubs, no murmurs ABD:  Flat, positive bowel sounds normal in frequency in pitch, no bruits, no rebound, no guarding, no midline pulsatile mass, no hepatomegaly, no splenomegaly EXT:  no edema, no cyanosis no clubbing SKIN:  No rashes no nodules NEURO:  Cranial nerves II through XII grossly  intact, motor grossly intact throughout PSYCH:  Cognitively intact, oriented to person place and time   ASSESSMENT AND PLAN: .    Assessment & Plan #  Peripheral artery disease Mild functional limitation due to claudication.  He is considering an intervention.  We will optimize medical therapy with LDL goal < 55.   - Increased rosuvastatin  to 10 mg daily to lower LDL to 55. - Instructed to monitor for muscle cramps or other side effects; revert to 5 mg if cramps occur.  He didn't tolerate pravastatin  - Will recheck cholesterol levels in a few months. - Continue aspirin , cilostazol , and Xarlto.  Complete heart block with pacemaker Intermittent palpitations  likely due to PVCs or brief ventricular tachycardia. Device shows normal function with occasional PVCs and brief tachycardia.  Managed by EP.   # Primary hypertension Blood pressure well-controlled with telmisartan  and metoprolol .  Less labile with BID dosing.  - Continue current antihypertensive regimen.  # Major depressive disorder Symptoms include lack of motivation and tenseness. Previous venlafaxine  trial not tolerated. Willing to try Lexapro . - Started Lexapro  (escitalopram ) at 5 mg daily. - Instructed to monitor for improvement over six weeks.      Dispo: f/u 4 months  Signed, Annabella Scarce, MD   "

## 2024-10-03 ENCOUNTER — Telehealth: Payer: Self-pay | Admitting: *Deleted

## 2024-10-03 ENCOUNTER — Ambulatory Visit (INDEPENDENT_AMBULATORY_CARE_PROVIDER_SITE_OTHER): Admitting: Audiology

## 2024-10-03 ENCOUNTER — Ambulatory Visit (INDEPENDENT_AMBULATORY_CARE_PROVIDER_SITE_OTHER): Admitting: Otolaryngology

## 2024-10-03 DIAGNOSIS — R400 Somnolence: Secondary | ICD-10-CM

## 2024-10-03 DIAGNOSIS — I5020 Unspecified systolic (congestive) heart failure: Secondary | ICD-10-CM

## 2024-10-03 DIAGNOSIS — G4734 Idiopathic sleep related nonobstructive alveolar hypoventilation: Secondary | ICD-10-CM

## 2024-10-03 DIAGNOSIS — R0683 Snoring: Secondary | ICD-10-CM

## 2024-10-03 DIAGNOSIS — F331 Major depressive disorder, recurrent, moderate: Secondary | ICD-10-CM

## 2024-10-03 DIAGNOSIS — I10 Essential (primary) hypertension: Secondary | ICD-10-CM

## 2024-10-03 DIAGNOSIS — J449 Chronic obstructive pulmonary disease, unspecified: Secondary | ICD-10-CM

## 2024-10-03 DIAGNOSIS — G4733 Obstructive sleep apnea (adult) (pediatric): Secondary | ICD-10-CM

## 2024-10-03 NOTE — Telephone Encounter (Signed)
 Order placed to [facility] to Order ONO on CPAP.

## 2024-10-03 NOTE — Telephone Encounter (Signed)
-----   Message from Wilbert Bihari, MD sent at 09/30/2024  9:57 AM EST ----- Order ONO on CPAP

## 2024-10-06 ENCOUNTER — Emergency Department (HOSPITAL_COMMUNITY)
Admission: EM | Admit: 2024-10-06 | Discharge: 2024-10-06 | Disposition: A | Discharge status: Home / Self Care | Attending: Emergency Medicine | Admitting: Emergency Medicine

## 2024-10-06 ENCOUNTER — Other Ambulatory Visit: Payer: Self-pay

## 2024-10-06 ENCOUNTER — Encounter (HOSPITAL_COMMUNITY): Payer: Self-pay

## 2024-10-06 ENCOUNTER — Emergency Department (HOSPITAL_COMMUNITY)

## 2024-10-06 DIAGNOSIS — Z950 Presence of cardiac pacemaker: Secondary | ICD-10-CM | POA: Insufficient documentation

## 2024-10-06 DIAGNOSIS — Z7901 Long term (current) use of anticoagulants: Secondary | ICD-10-CM | POA: Diagnosis not present

## 2024-10-06 DIAGNOSIS — W01198A Fall on same level from slipping, tripping and stumbling with subsequent striking against other object, initial encounter: Secondary | ICD-10-CM | POA: Diagnosis not present

## 2024-10-06 DIAGNOSIS — S2232XA Fracture of one rib, left side, initial encounter for closed fracture: Secondary | ICD-10-CM | POA: Diagnosis not present

## 2024-10-06 DIAGNOSIS — W19XXXA Unspecified fall, initial encounter: Secondary | ICD-10-CM

## 2024-10-06 DIAGNOSIS — R0789 Other chest pain: Secondary | ICD-10-CM | POA: Diagnosis present

## 2024-10-06 LAB — CBC WITH DIFFERENTIAL/PLATELET
Abs Immature Granulocytes: 0.05 K/uL (ref 0.00–0.07)
Basophils Absolute: 0.1 K/uL (ref 0.0–0.1)
Basophils Relative: 1 %
Eosinophils Absolute: 0.3 K/uL (ref 0.0–0.5)
Eosinophils Relative: 4 %
HCT: 38.4 % — ABNORMAL LOW (ref 39.0–52.0)
Hemoglobin: 13 g/dL (ref 13.0–17.0)
Immature Granulocytes: 1 %
Lymphocytes Relative: 12 %
Lymphs Abs: 0.9 K/uL (ref 0.7–4.0)
MCH: 33 pg (ref 26.0–34.0)
MCHC: 33.9 g/dL (ref 30.0–36.0)
MCV: 97.5 fL (ref 80.0–100.0)
Monocytes Absolute: 0.9 K/uL (ref 0.1–1.0)
Monocytes Relative: 12 %
Neutro Abs: 5.2 K/uL (ref 1.7–7.7)
Neutrophils Relative %: 70 %
Platelets: 246 K/uL (ref 150–400)
RBC: 3.94 MIL/uL — ABNORMAL LOW (ref 4.22–5.81)
RDW: 13.2 % (ref 11.5–15.5)
WBC: 7.5 K/uL (ref 4.0–10.5)
nRBC: 0 % (ref 0.0–0.2)

## 2024-10-06 LAB — TROPONIN T, HIGH SENSITIVITY
Troponin T High Sensitivity: <15 ng/L (ref 0–19)
Troponin T High Sensitivity: <15 ng/L (ref 0–19)

## 2024-10-06 LAB — BASIC METABOLIC PANEL WITH GFR
Anion gap: 9 (ref 5–15)
BUN: 13 mg/dL (ref 8–23)
CO2: 24 mmol/L (ref 22–32)
Calcium: 9.1 mg/dL (ref 8.9–10.3)
Chloride: 94 mmol/L — ABNORMAL LOW (ref 98–111)
Creatinine, Ser: 1 mg/dL (ref 0.61–1.24)
GFR, Estimated: >60 mL/min
Glucose, Bld: 135 mg/dL — ABNORMAL HIGH (ref 70–99)
Potassium: 4.8 mmol/L (ref 3.5–5.1)
Sodium: 127 mmol/L — ABNORMAL LOW (ref 135–145)

## 2024-10-06 MED ORDER — ACETAMINOPHEN 500 MG PO TABS
1000.0000 mg | ORAL_TABLET | Freq: Once | ORAL | Status: AC
  Administered 2024-10-06: 1000 mg via ORAL
  Filled 2024-10-06: qty 2

## 2024-10-06 MED ORDER — ONDANSETRON HCL 4 MG PO TABS: 4.0000 mg | ORAL_TABLET | Freq: Three times a day (TID) | ORAL | Status: AC | PRN

## 2024-10-06 MED ORDER — MORPHINE SULFATE 15 MG PO TABS: 15.0000 mg | ORAL_TABLET | Freq: Three times a day (TID) | ORAL | 0 refills | Status: AC

## 2024-10-06 NOTE — ED Provider Notes (Signed)
 " [place] [facility] AT [facility] Provider Note   CSN: [id number] Arrival date & time: 10/06/24  1154     Patient presents with: Brian Hunt is a 79 y.o. male.  Who presents to the ED after a fall.  Patient was carrying a large cooler when he tripped and fell on top of the cooler striking the left side of his chest.  Now has persistent anterior left chest pain.  No head injuries loss of consciousness or other complaints.  Able to get up under his own power.  Is on Xarelto .    Fall       Prior to Admission medications  Medication Sig Start Date End Date Taking? Authorizing Provider  morphine  (MSIR) 15 MG tablet Take 1 tablet (15 mg total) by mouth every 8 (eight) hours for 5 days. 10/06/24 10/11/24 Yes Pamella Ozell LABOR, DO  ondansetron  (ZOFRAN ) 4 MG tablet Take 1 tablet (4 mg total) by mouth every 8 (eight) hours as needed for up to 5 days for nausea or vomiting. 10/06/24 10/11/24 Yes Pamella Ozell LABOR, DO  amoxicillin  (AMOXIL ) 500 MG tablet Take 4 tablets (2000 mg) by mouth one hour prior to dental procedure. 07/23/24   Sebastian Lamarr SAUNDERS, PA-C  aspirin  EC 81 MG tablet Take 81 mg by mouth daily.    [provider]  cilostazol  (PLETAL ) 50 MG tablet TAKE 1 TABLET(50 MG) BY MOUTH TWICE DAILY 08/29/24   Court Dorn PARAS, MD  cyanocobalamin  (VITAMIN B12) 1000 MCG tablet Take 1,000 mcg by mouth daily.    [provider]  escitalopram  (LEXAPRO ) 5 MG tablet Take 1 tablet (5 mg total) by mouth daily. ADDITIONAL REFILLS FROM YOUR PRIMARY CARE 10/02/24   Raford Riggs, MD  famotidine (PEPCID) 20 MG tablet Take 20 mg by mouth daily as needed for heartburn or indigestion.    [provider]  finasteride (PROSCAR) 5 MG tablet Take 5 mg by mouth daily. 10/17/23   [provider]  fluocinonide -emollient (LIDEX -E) 0.05 % cream Apply 1 Application topically 2 (two) times daily. 02/19/24   Joshua Debby CROME, MD  fluticasone (FLONASE) 50  MCG/ACT nasal spray Place 2 sprays into both nostrils daily.    [provider]  hydrocortisone cream 1 % Apply 1 Application topically daily as needed for itching.    [provider]  metoprolol  succinate (TOPROL -XL) 25 MG 24 hr tablet TAKE 1 TABLET(25 MG) BY MOUTH DAILY WITH OR IMMEDIATELY FOLLOWING A MEAL 05/02/24   Raford Riggs, MD  Propylene Glycol (SYSTANE BALANCE) 0.6 % SOLN Place 1 drop into both eyes as needed (dry eyes).    [provider]  rivaroxaban  (XARELTO ) 2.5 MG TABS tablet TAKE 1 TABLET(2.5 MG) BY MOUTH TWICE DAILY 08/26/24   Raford Riggs, MD  rosuvastatin  (CRESTOR ) 10 MG tablet Take 1 tablet (10 mg total) by mouth daily. 10/02/24   Raford Riggs, MD  tamsulosin  (FLOMAX ) 0.4 MG CAPS capsule Take by mouth. 08/05/24   [provider]  telmisartan  (MICARDIS ) 80 MG tablet Take 0.5 tablets (40 mg total) by mouth in the morning and at bedtime. 03/20/24   Raford Riggs, MD  Tiotropium Bromide-Olodaterol (STIOLTO RESPIMAT ) 2.5-2.5 MCG/ACT AERS Inhale 1 puff into the lungs daily.    [provider]    Allergies: Demeclocycline, Pravastatin , Lisinopril , and Oxycodone     Review of Systems  Updated Vital Signs BP (!) 192/87   Pulse 60   Temp 97.9 F (36.6 C) (Oral)  Resp 18   Ht 5' 7 (1.702 m)   Wt 77.1 kg   SpO2 96%   BMI 26.63 kg/m   Physical Exam Vitals and nursing note reviewed.  HENT:     Head: Normocephalic and atraumatic.  Eyes:     Pupils: Pupils are equal, round, and reactive to light.  Cardiovascular:     Rate and Rhythm: Normal rate and regular rhythm.  Pulmonary:     Effort: Pulmonary effort is normal.     Breath sounds: Normal breath sounds.  Abdominal:     Palpations: Abdomen is soft.     Tenderness: There is no abdominal tenderness.  Musculoskeletal:     Cervical back: Neck supple. No tenderness.     Comments: Left anterior chest wall tenderness without crepitus deformity No midline tenderness  step-off deformity back Full active range of motion bilateral upper and lower extremities  Skin:    General: Skin is warm and dry.  Neurological:     Mental Status: He is alert.  Psychiatric:        Mood and Affect: Mood normal.     (all labs ordered are listed, but only abnormal results are displayed) Labs Reviewed  CBC WITH DIFFERENTIAL/PLATELET - Abnormal; Notable for the following components:      Result Value   RBC 3.94 (*)    HCT 38.4 (*)    All other components within normal limits  BASIC METABOLIC PANEL WITH GFR - Abnormal; Notable for the following components:   Sodium 127 (*)    Chloride 94 (*)    Glucose, Bld 135 (*)    All other components within normal limits  TROPONIN T, HIGH SENSITIVITY  TROPONIN T, HIGH SENSITIVITY    EKG: EKG Interpretation Date/Time:  Monday October 06 2024 13:08:03 EST Ventricular Rate:  69 PR Interval:  152 QRS Duration:  154 QT Interval:  422 QTC Calculation: 453 R Axis:   46  Text Interpretation: Sinus rhythm Right bundle branch block Confirmed by Pamella Sharper [phone]) on 10/06/2024 7:02:36 PM  Radiology: [facility] Chest 2 View Result Date: 10/06/2024 CLINICAL DATA:  Fall, chest pain and shortness of breath. EXAM: CHEST - 2 VIEW COMPARISON:  02/19/2024 and CT chest 04/12/2021. FINDINGS: Trachea is midline. Heart is at the upper limits of normal in size to mildly enlarged. Thoracic aorta is calcified. Pacemaker lead tips are in the right atrium and right ventricle. Aortic valve replacement. Lungs are clear. No pleural fluid. Right shoulder arthroplasty. IMPRESSION: No acute findings. Electronically Signed   By: Newell Eke M.D.   On: 10/06/2024 14:31     Procedures   Medications Ordered in the ED  acetaminophen  (TYLENOL ) tablet 1,000 mg (has no administration in time range)                                    Medical Decision Making 79 year old male presenting after mechanical fall at home.  Reporting chest pain after landing on  left ribs.  No head trauma or loss of consciousness.  No other traumatic findings on my exam.  Chest x-ray initially read as negative but upon further evaluation there does appear to be 1 acute nondisplaced rib fracture in the area where he is tender to over the left anterior chest.  Pulling 1750 on incentive spirometer.  Stable on room air.  Counseled him on symptomatic management of rib fracture with Tylenol .  Laboratory workup at patient's  baseline.  Cardiac enzyme negative.  Low suspicion for blunt cardiac injury.  Will prescribe p.o. morphine  which he has tolerated in the past for severe pain only and he will follow-up with PCP.  Risk OTC drugs. Prescription drug management.        Final diagnoses:  Fall, initial encounter  Closed fracture of one rib of left side, initial encounter    ED Discharge Orders          Ordered    morphine  (MSIR) 15 MG tablet  Every 8 hours        10/06/24 1945    ondansetron  (ZOFRAN ) 4 MG tablet  Every 8 hours PRN        10/06/24 1945               Pamella Ozell LABOR, DO 10/06/24 1946  "

## 2024-10-06 NOTE — ED Triage Notes (Signed)
 Pt presents to ED from home C/O mechanical fall prior to arrival. Now with L sided rib cage pain, concerned that during fall may have broken rib and/or dislodged pacemaker.

## 2024-10-06 NOTE — ED Provider Triage Note (Signed)
 Emergency Medicine Provider Triage Evaluation Note  Brian Hunt , a 79 y.o. male  was evaluated in triage.  Pt complains of fall and chest pain. Mechanical fall just PTA> Faell on eft side and hit his pacemaker. Pain 5/10 with rest, worse with deep inhalation and coughing. No head/neck injury or LOC  Review of Systems  Positive: Fall   Negative: Loc   Physical Exam  Ht 5' 7 (1.702 m)   Wt 77.1 kg   BMI 26.63 kg/m  Gen:   Awake, no distress   Resp:  Normal effort  MSK:   Moves extremities without difficulty  Other:    Medical Decision Making  Medically screening exam initiated at 12:45 PM.  Appropriate orders placed.  Brian Hunt was informed that the remainder of the evaluation will be completed by another provider, this initial triage assessment does not replace that evaluation, and the importance of remaining in the ED until their evaluation is complete.     Arloa Chroman, PA-C 10/06/24 1328

## 2024-10-06 NOTE — Discharge Instructions (Addendum)
 You were seen in the Emergency Department for injuries after a fall You likely have a fractured rib on the left side where you are tender Use the incentive spirometer every few hours at home Take Tylenol  as directed for pain For severe pain at home take the prescribed morphine  which we have called into your [facility] pharmacy Do not drink alcohol or drive while taking morphine  Follow-up with your primary doctor within 1 week for reevaluation Return to the emergency department for trouble breathing severe pain or other concerns

## 2024-10-07 ENCOUNTER — Ambulatory Visit (HOSPITAL_COMMUNITY)

## 2024-10-07 ENCOUNTER — Telehealth: Payer: Self-pay

## 2024-10-07 NOTE — Telephone Encounter (Signed)
 Copied from CRM [id number]. Topic: Appointments - Scheduling Inquiry for Clinic >> Oct 07, 2024 10:15 AM Grant H wrote: Reason for CRM: Patient recently visited ER discharged 10/06/2024.  His diagnosis was a fractured/broken rib on the left side.  I attempted to scheduled Hospital F/U but none of the time slots worked for the patient. Please see if we can fit him in or get him seen by another provider ASAP.   -----------------------------------------------------------------------

## 2024-10-08 NOTE — Telephone Encounter (Signed)
Patient has been scheduled to see Dr Yetta Barre

## 2024-10-13 ENCOUNTER — Ambulatory Visit: Payer: Self-pay

## 2024-10-13 ENCOUNTER — Ambulatory Visit
Admission: RE | Admit: 2024-10-13 | Discharge: 2024-10-13 | Disposition: A | Discharge status: Home / Self Care | Attending: Family Medicine | Admitting: Family Medicine

## 2024-10-13 VITALS — BP 175/74 | HR 71 | Temp 98.8°F | Resp 20

## 2024-10-13 DIAGNOSIS — J209 Acute bronchitis, unspecified: Secondary | ICD-10-CM

## 2024-10-13 DIAGNOSIS — J440 Chronic obstructive pulmonary disease with acute lower respiratory infection: Secondary | ICD-10-CM

## 2024-10-13 MED ORDER — PREDNISONE 10 MG PO TABS: 30.0000 mg | ORAL_TABLET | Freq: Every day | ORAL | 0 refills | Status: DC

## 2024-10-13 MED ORDER — AZITHROMYCIN 250 MG PO TABS: ORAL_TABLET | ORAL | 0 refills | Status: DC

## 2024-10-13 NOTE — ED Provider Notes (Signed)
 " Producer, Television/film/video - [facility]  Note:  This document was prepared using Conservation officer, historic buildings and may include unintentional dictation errors.  MRN: [id number] DOB: February 24, 1946  Subjective:   Brian Hunt is a 79 y.o. male with pmh of COPD presenting for 7-day history of persistent productive cough, chest congestion and shortness of breath.  Has not used an inhaler recently.  Still smokes 1/2 pack/day.  No fever, sinus pain, throat pain, ear pain.  Has a history of congestive heart failure and last EF was 60%-65% from 2025.  No recent cardiac procedures.  Has had mild chest pain due to a recent cracked rib but generally is not affecting his breathing.  Current Outpatient Medications  Medication Instructions   amoxicillin  (AMOXIL ) 500 MG tablet Take 4 tablets (2000 mg) by mouth one hour prior to dental procedure.   aspirin  EC 81 mg, Daily   cilostazol  (PLETAL ) 50 MG tablet TAKE 1 TABLET(50 MG) BY MOUTH TWICE DAILY   cyanocobalamin  (VITAMIN B12) 1,000 mcg, Daily   escitalopram  (LEXAPRO ) 5 mg, Oral, Daily, ADDITIONAL REFILLS FROM YOUR PRIMARY CARE   famotidine (PEPCID) 20 mg, Daily PRN   finasteride  (PROSCAR ) 5 mg, Daily   fluocinonide -emollient (LIDEX -E) 0.05 % cream 1 Application, Topical, 2 times daily   fluticasone (FLONASE) 50 MCG/ACT nasal spray 2 sprays, Daily   hydrocortisone cream 1 % 1 Application, Daily PRN   metoprolol  succinate (TOPROL -XL) 25 MG 24 hr tablet TAKE 1 TABLET(25 MG) BY MOUTH DAILY WITH OR IMMEDIATELY FOLLOWING A MEAL   Propylene Glycol (SYSTANE BALANCE) 0.6 % SOLN 1 drop, As needed   rivaroxaban  (XARELTO ) 2.5 MG TABS tablet TAKE 1 TABLET(2.5 MG) BY MOUTH TWICE DAILY   rosuvastatin  (CRESTOR ) 10 mg, Oral, Daily   tamsulosin  (FLOMAX ) 0.4 MG CAPS capsule Take by mouth.   telmisartan  (MICARDIS ) 40 mg, Oral, 2 times daily   Tiotropium Bromide-Olodaterol (STIOLTO RESPIMAT ) 2.5-2.5 MCG/ACT AERS 1 puff, Daily    Allergies[1]  Past Medical History:   Diagnosis Date   Anemia    Anxiety    Aortic stenosis, severe 03/09/2018              Reading Physician    Reading Date    Result Priority      Shlomo Wilbert SAUNDERS, MD [phone]    07/06/2021    Routine     Narrative & Impression       The study is normal. The study is low risk.    RBBB with repolarization changes was present at baseline with no ST changes from baseline during infusion.  Occsaional PVCs were noted.    LV perfusion is normal. There is no evidence of is   Arthritis    Basal cell carcinoma    Basal Cell X2 , Dr Shlomo   BPH (benign prostatic hyperplasia)    Bursitis of left hip    Dr. 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   s/p TAVR with a 29 mm Pavelko S3UR via the TF approach by Drs Wonda ODESSIA Fellers     Past Surgical History:  Procedure Laterality Date   COLONOSCOPY  2010   negative; [facility]   CYSTOSCOPY  10/2014   [facility]; neg   dislocation of right hip  2019   GREEN LIGHT LASER TURP (TRANSURETHRAL RESECTION OF PROSTATE  09/27/2012   Procedure: GREEN LIGHT LASER TURP (TRANSURETHRAL RESECTION OF PROSTATE;  Surgeon: Donnice Gwenyth Brooks, MD;  Location: [facility];  Service: Urology;  Laterality: N/A;      INTRAOPERATIVE TRANSTHORACIC ECHOCARDIOGRAM N/A 07/10/2023   Procedure: INTRAOPERATIVE TRANSTHORACIC ECHOCARDIOGRAM;  Surgeon: Wonda Sharper, MD;  Location: [facility] INVASIVE CV LAB;  Service: Cardiovascular;  Laterality:  N/A;   JOINT REPLACEMENT Left Oct. 12, 2012   left total hip replacement by Dr. Liam   PACEMAKER IMPLANT N/A 07/11/2023   Procedure: PACEMAKER IMPLANT;  Surgeon: Cindie Ole DASEN, MD;  Location: [facility] INVASIVE CV LAB;  Service: Cardiovascular;  Laterality: N/A;   PROSTATE BIOPSY  2007 , 2009   X2 , Dr Terence  & Dr Andra   PROSTATE SURGERY     Laser   RIGHT/LEFT HEART CATH AND CORONARY ANGIOGRAPHY N/A 06/18/2023   Procedure: RIGHT/LEFT HEART CATH AND CORONARY ANGIOGRAPHY;  Surgeon: Wendel Lurena POUR, MD;  Location: [facility] INVASIVE CV LAB;  Service: Cardiovascular;  Laterality: N/A;   TEMPORARY PACEMAKER Right 07/10/2023   Procedure: TEMPORARY PACEMAKER;  Surgeon: Wonda Sharper, MD;  Location: [facility] INVASIVE CV LAB;  Service: Cardiovascular;  Laterality: Right;   TOTAL HIP ARTHROPLASTY Right 09/26/2017   TOTAL HIP ARTHROPLASTY Right 09/26/2017   Procedure: TOTAL HIP ARTHROPLASTY ANTERIOR APPROACH;  Surgeon: Liam Lerner, MD;  Location: [facility] OR;  Service: Orthopedics;  Laterality: Right;   TOTAL SHOULDER ARTHROPLASTY Right 08/17/2022   Procedure: TOTAL SHOULDER ARTHROPLASTY WITH AUGMENTED GLENOID;  Surgeon: Dozier Soulier, MD;  Location: [facility];  Service: Orthopedics;  Laterality: Right;   TRANSCATHETER AORTIC VALVE REPLACEMENT,SUBCLAVIAN Left 07/10/2023   Procedure: Possible Transcatheter Aortic Valve Replacement-Left Subclavian;  Surgeon: Wonda Sharper, MD;  Location: [facility] INVASIVE CV LAB;  Service: Cardiovascular;  Laterality: Left;    Family History  Problem Relation Age of Onset   Arthritis Mother    Vascular Disease Mother        Varicose Veins   Hypertension Mother    Hyperlipidemia Mother    Transient ischemic attack Mother    Heart disease Mother        After age 40   Stroke Mother    COPD Father    Bone cancer Maternal Grandmother    Heart attack Paternal Grandmother 74   Colon cancer Son 33   Turner syndrome Daughter    Asthma Neg Hx    Diabetes Neg Hx    Pancreatic cancer  Neg Hx    Esophageal cancer Neg Hx    Stomach cancer Neg Hx    Liver disease Neg Hx    Rectal cancer Neg Hx     Social History   Occupational History   Occupation: retired  Tobacco Use   Smoking status: Every Day    Current packs/day: 0.50    Average packs/day: 0.5 packs/day for 3.3 years (1.7 ttl pk-yrs)    Types: Cigarettes    Start date: 06/15/2021    Last attempt to quit: 06/14/1981   Smokeless tobacco: Never  Vaping Use   Vaping status: Never Used  Substance  and Sexual Activity   Alcohol use: No   Drug use: No   Sexual activity: Yes     ROS   Objective:   Vitals: BP (!) 175/74 (BP Location: Left Arm)   Pulse 71   Temp 98.8 F (37.1 C) (Oral)   Resp 20   SpO2 94%   Physical Exam Constitutional:      General: He is not in acute distress.    Appearance: Normal appearance. He is well-developed and normal weight. He is not ill-appearing, toxic-appearing or diaphoretic.  HENT:     Head: Normocephalic and atraumatic.     Right Ear: External ear normal.     Left Ear: External ear normal.     Nose: Nose normal.     Mouth/Throat:     Mouth: Mucous membranes are moist.     Pharynx: No pharyngeal swelling, oropharyngeal exudate, posterior oropharyngeal erythema or uvula swelling.     Tonsils: No tonsillar exudate or tonsillar abscesses. 0 on the right. 0 on the left.  Eyes:     General: No scleral icterus.       Right eye: No discharge.        Left eye: No discharge.     Extraocular Movements: Extraocular movements intact.  Cardiovascular:     Rate and Rhythm: Normal rate and regular rhythm.     Heart sounds: Normal heart sounds. No murmur heard.    No friction rub. No gallop.  Pulmonary:     Effort: Pulmonary effort is normal. No respiratory distress.     Breath sounds: No stridor. No wheezing, rhonchi or rales.     Comments: Coarse lung sounds throughout. Musculoskeletal:     Cervical back: Normal range of motion.  Neurological:     Mental Status: He is  alert and oriented to person, place, and time.  Psychiatric:        Mood and Affect: Mood normal.        Behavior: Behavior normal.        Thought Content: Thought content normal.        Judgment: Judgment normal.     Assessment and Plan :   PDMP not reviewed this encounter.  1. Bronchitis, chronic obstructive w acute bronchitis (HCC)      Recommended managing for bronchitis with azithromycin  and prednisone .  Will defer imaging.  Use supportive care.  Counseled patient on potential for adverse effects with medications prescribed/recommended today, ER and return-to-clinic precautions discussed, patient verbalized understanding.     [1]  Allergies Allergen Reactions   Demeclocycline Nausea Only    Nausea and constipation   Pravastatin  Other (See Comments)    myopathy   Lisinopril  Cough   Oxycodone      Mental confusion     Christopher Savannah, PA-C 10/14/24 [id number]  "

## 2024-10-13 NOTE — ED Triage Notes (Signed)
 Pt c/o prod cough and SHOB x 7 days-states he had a recent fall with a cracked rib-NAD-steady gait

## 2024-10-13 NOTE — Telephone Encounter (Signed)
 FYI Only or Action Required?: FYI only for provider: ED advised.  Patient was last seen in primary care on 08/08/2024 by Norleen Lynwood ORN, MD.  Called Nurse Triage reporting Cough and Shortness of Breath.  Symptoms began several days ago.  Interventions attempted: Rest, hydration, or home remedies.  Symptoms are: gradually worsening.  Triage Disposition: Go to ED Now (Notify PCP)  Patient/caregiver understands and will follow disposition?: No  Reason for Disposition  [1] MODERATE difficulty breathing (e.g., speaks in phrases, SOB even at rest, pulse 100-120) AND [2] NEW-onset or WORSE than normal  Answer Assessment - Initial Assessment Questions Patient states that he went to the ED with a rib fracture last Monday and has been having cough with SOB since then. He states that it started to worsen on Wednesday of last week and today he is experiencing intermittent moderate to severe SOB. ED advised, refused. Given information to UC that is open today given hazardous weather. Advised to be seen ASAP today.   1. RESPIRATORY STATUS: Describe your breathing? (e.g., wheezing, shortness of breath, unable to speak, severe coughing)      Shortness of breath  2. ONSET: When did this breathing problem begin?      Has been ongoing since last Monday when patient fractured rib  3. PATTERN Does the difficult breathing come and go, or has it been constant since it started?      Comes and goes  4. SEVERITY: How bad is your breathing? (e.g., mild, moderate, severe)      Moderate-severe  5. RECURRENT SYMPTOM: Have you had difficulty breathing before? If Yes, ask: When was the last time? and What happened that time?      Unknown  6. CARDIAC HISTORY: Do you have any history of heart disease? (e.g., heart attack, angina, bypass surgery, angioplasty)      Heart failure  7. LUNG HISTORY: Do you have any history of lung disease?  (e.g., pulmonary embolus, asthma, emphysema)      COPD  8. CAUSE: What do you think is causing the breathing problem?      Rib fracture  9. OTHER SYMPTOMS: Do you have any other symptoms? (e.g., chest pain, cough, dizziness, fever, runny nose)     Cough, chest pain from rib fracture, fever 99.5F  10. O2 SATURATION MONITOR:  Do you use an oxygen  saturation monitor (pulse oximeter) at home? If Yes, ask: What is your reading (oxygen  level) today? What is your usual oxygen  saturation reading? (e.g., 95%)       93% at time of triage, states he measured it earlier and it was 86% then went up to 95%  11. PREGNANCY: Is there any chance you are pregnant? When was your last menstrual period?       NA  12. TRAVEL: Have you traveled out of the country in the last month? (e.g., travel history, exposures)       Unknown  Protocols used: Breathing Difficulty-A-AH  Reason for Triage: pt exp shortness of breath, coughing , unable to clear lungs without pain, has a fractured rib currently, slight fever 99.8

## 2024-10-14 ENCOUNTER — Telehealth: Payer: Self-pay

## 2024-10-14 ENCOUNTER — Encounter (HOSPITAL_COMMUNITY): Payer: Self-pay

## 2024-10-14 ENCOUNTER — Other Ambulatory Visit: Payer: Self-pay

## 2024-10-14 ENCOUNTER — Ambulatory Visit

## 2024-10-14 ENCOUNTER — Inpatient Hospital Stay (HOSPITAL_COMMUNITY)
Admission: EM | Admit: 2024-10-14 | Discharge: 2024-10-17 | DRG: 643 | Disposition: A | Discharge status: Home / Self Care | Attending: Internal Medicine | Admitting: Internal Medicine

## 2024-10-14 ENCOUNTER — Ambulatory Visit: Payer: Self-pay | Admitting: Internal Medicine

## 2024-10-14 ENCOUNTER — Encounter: Payer: Self-pay | Admitting: Internal Medicine

## 2024-10-14 ENCOUNTER — Ambulatory Visit: Admitting: Internal Medicine

## 2024-10-14 ENCOUNTER — Emergency Department (HOSPITAL_COMMUNITY)

## 2024-10-14 VITALS — BP 166/86 | HR 82 | Temp 98.4°F | Resp 16 | Ht 67.0 in | Wt 172.8 lb

## 2024-10-14 DIAGNOSIS — N400 Enlarged prostate without lower urinary tract symptoms: Secondary | ICD-10-CM | POA: Diagnosis present

## 2024-10-14 DIAGNOSIS — Z8679 Personal history of other diseases of the circulatory system: Secondary | ICD-10-CM | POA: Diagnosis not present

## 2024-10-14 DIAGNOSIS — Z0001 Encounter for general adult medical examination with abnormal findings: Secondary | ICD-10-CM

## 2024-10-14 DIAGNOSIS — Z8249 Family history of ischemic heart disease and other diseases of the circulatory system: Secondary | ICD-10-CM

## 2024-10-14 DIAGNOSIS — R739 Hyperglycemia, unspecified: Secondary | ICD-10-CM

## 2024-10-14 DIAGNOSIS — J189 Pneumonia, unspecified organism: Secondary | ICD-10-CM | POA: Diagnosis present

## 2024-10-14 DIAGNOSIS — Z96611 Presence of right artificial shoulder joint: Secondary | ICD-10-CM | POA: Diagnosis present

## 2024-10-14 DIAGNOSIS — R0989 Other specified symptoms and signs involving the circulatory and respiratory systems: Secondary | ICD-10-CM | POA: Insufficient documentation

## 2024-10-14 DIAGNOSIS — D539 Nutritional anemia, unspecified: Secondary | ICD-10-CM | POA: Insufficient documentation

## 2024-10-14 DIAGNOSIS — Z800 Family history of malignant neoplasm of digestive organs: Secondary | ICD-10-CM

## 2024-10-14 DIAGNOSIS — R911 Solitary pulmonary nodule: Secondary | ICD-10-CM | POA: Diagnosis present

## 2024-10-14 DIAGNOSIS — Z87891 Personal history of nicotine dependence: Secondary | ICD-10-CM

## 2024-10-14 DIAGNOSIS — Z8709 Personal history of other diseases of the respiratory system: Secondary | ICD-10-CM | POA: Diagnosis not present

## 2024-10-14 DIAGNOSIS — E222 Syndrome of inappropriate secretion of antidiuretic hormone: Principal | ICD-10-CM | POA: Diagnosis present

## 2024-10-14 DIAGNOSIS — I495 Sick sinus syndrome: Secondary | ICD-10-CM | POA: Diagnosis present

## 2024-10-14 DIAGNOSIS — R0789 Other chest pain: Secondary | ICD-10-CM | POA: Insufficient documentation

## 2024-10-14 DIAGNOSIS — Z950 Presence of cardiac pacemaker: Secondary | ICD-10-CM | POA: Diagnosis not present

## 2024-10-14 DIAGNOSIS — Z825 Family history of asthma and other chronic lower respiratory diseases: Secondary | ICD-10-CM

## 2024-10-14 DIAGNOSIS — Z0000 Encounter for general adult medical examination without abnormal findings: Secondary | ICD-10-CM | POA: Diagnosis not present

## 2024-10-14 DIAGNOSIS — R9389 Abnormal findings on diagnostic imaging of other specified body structures: Secondary | ICD-10-CM | POA: Insufficient documentation

## 2024-10-14 DIAGNOSIS — D508 Other iron deficiency anemias: Secondary | ICD-10-CM | POA: Insufficient documentation

## 2024-10-14 DIAGNOSIS — D72829 Elevated white blood cell count, unspecified: Secondary | ICD-10-CM | POA: Diagnosis present

## 2024-10-14 DIAGNOSIS — Z952 Presence of prosthetic heart valve: Secondary | ICD-10-CM | POA: Diagnosis not present

## 2024-10-14 DIAGNOSIS — Z885 Allergy status to narcotic agent status: Secondary | ICD-10-CM

## 2024-10-14 DIAGNOSIS — Z7902 Long term (current) use of antithrombotics/antiplatelets: Secondary | ICD-10-CM

## 2024-10-14 DIAGNOSIS — Z823 Family history of stroke: Secondary | ICD-10-CM

## 2024-10-14 DIAGNOSIS — Z85828 Personal history of other malignant neoplasm of skin: Secondary | ICD-10-CM

## 2024-10-14 DIAGNOSIS — R531 Weakness: Secondary | ICD-10-CM | POA: Diagnosis not present

## 2024-10-14 DIAGNOSIS — M898X1 Other specified disorders of bone, shoulder: Secondary | ICD-10-CM

## 2024-10-14 DIAGNOSIS — Z7982 Long term (current) use of aspirin: Secondary | ICD-10-CM

## 2024-10-14 DIAGNOSIS — E538 Deficiency of other specified B group vitamins: Secondary | ICD-10-CM

## 2024-10-14 DIAGNOSIS — I5042 Chronic combined systolic (congestive) and diastolic (congestive) heart failure: Secondary | ICD-10-CM | POA: Diagnosis present

## 2024-10-14 DIAGNOSIS — Z8261 Family history of arthritis: Secondary | ICD-10-CM

## 2024-10-14 DIAGNOSIS — I739 Peripheral vascular disease, unspecified: Secondary | ICD-10-CM | POA: Diagnosis present

## 2024-10-14 DIAGNOSIS — W010XXA Fall on same level from slipping, tripping and stumbling without subsequent striking against object, initial encounter: Secondary | ICD-10-CM | POA: Diagnosis present

## 2024-10-14 DIAGNOSIS — A419 Sepsis, unspecified organism: Secondary | ICD-10-CM | POA: Diagnosis present

## 2024-10-14 DIAGNOSIS — R296 Repeated falls: Secondary | ICD-10-CM

## 2024-10-14 DIAGNOSIS — R5383 Other fatigue: Secondary | ICD-10-CM

## 2024-10-14 DIAGNOSIS — S2232XA Fracture of one rib, left side, initial encounter for closed fracture: Secondary | ICD-10-CM | POA: Diagnosis present

## 2024-10-14 DIAGNOSIS — Z96643 Presence of artificial hip joint, bilateral: Secondary | ICD-10-CM | POA: Diagnosis present

## 2024-10-14 DIAGNOSIS — E871 Hypo-osmolality and hyponatremia: Principal | ICD-10-CM | POA: Diagnosis present

## 2024-10-14 DIAGNOSIS — E782 Mixed hyperlipidemia: Secondary | ICD-10-CM | POA: Diagnosis not present

## 2024-10-14 DIAGNOSIS — G4730 Sleep apnea, unspecified: Secondary | ICD-10-CM | POA: Diagnosis present

## 2024-10-14 DIAGNOSIS — I480 Paroxysmal atrial fibrillation: Secondary | ICD-10-CM | POA: Diagnosis present

## 2024-10-14 DIAGNOSIS — Z888 Allergy status to other drugs, medicaments and biological substances status: Secondary | ICD-10-CM

## 2024-10-14 DIAGNOSIS — D638 Anemia in other chronic diseases classified elsewhere: Secondary | ICD-10-CM | POA: Diagnosis present

## 2024-10-14 DIAGNOSIS — Z7901 Long term (current) use of anticoagulants: Secondary | ICD-10-CM

## 2024-10-14 DIAGNOSIS — I10 Essential (primary) hypertension: Secondary | ICD-10-CM

## 2024-10-14 DIAGNOSIS — J440 Chronic obstructive pulmonary disease with acute lower respiratory infection: Secondary | ICD-10-CM | POA: Diagnosis present

## 2024-10-14 DIAGNOSIS — I110 Hypertensive heart disease with heart failure: Secondary | ICD-10-CM | POA: Diagnosis present

## 2024-10-14 DIAGNOSIS — Z83438 Family history of other disorder of lipoprotein metabolism and other lipidemia: Secondary | ICD-10-CM

## 2024-10-14 DIAGNOSIS — G4733 Obstructive sleep apnea (adult) (pediatric): Secondary | ICD-10-CM | POA: Diagnosis present

## 2024-10-14 DIAGNOSIS — Z9079 Acquired absence of other genital organ(s): Secondary | ICD-10-CM

## 2024-10-14 DIAGNOSIS — E785 Hyperlipidemia, unspecified: Secondary | ICD-10-CM | POA: Diagnosis present

## 2024-10-14 DIAGNOSIS — Z79899 Other long term (current) drug therapy: Secondary | ICD-10-CM

## 2024-10-14 DIAGNOSIS — R0902 Hypoxemia: Secondary | ICD-10-CM | POA: Diagnosis present

## 2024-10-14 LAB — COMPREHENSIVE METABOLIC PANEL WITH GFR
ALT: 14 U/L (ref 0–44)
AST: 21 U/L (ref 15–41)
Albumin: 3.2 g/dL — ABNORMAL LOW (ref 3.5–5.0)
Alkaline Phosphatase: 97 U/L (ref 38–126)
Anion gap: 11 (ref 5–15)
BUN: 16 mg/dL (ref 8–23)
CO2: 22 mmol/L (ref 22–32)
Calcium: 8.8 mg/dL — ABNORMAL LOW (ref 8.9–10.3)
Chloride: 85 mmol/L — ABNORMAL LOW (ref 98–111)
Creatinine, Ser: 0.84 mg/dL (ref 0.61–1.24)
GFR, Estimated: >60 mL/min
Glucose, Bld: 156 mg/dL — ABNORMAL HIGH (ref 70–99)
Potassium: 4.9 mmol/L (ref 3.5–5.1)
Sodium: 118 mmol/L — CL (ref 135–145)
Total Bilirubin: 0.7 mg/dL (ref 0.0–1.2)
Total Protein: 7 g/dL (ref 6.5–8.1)

## 2024-10-14 LAB — CBC WITH DIFFERENTIAL/PLATELET
Abs Immature Granulocytes: 0.4 10*3/uL — ABNORMAL HIGH (ref 0.00–0.07)
Basophils Absolute: 0.1 10*3/uL (ref 0.0–0.1)
Basophils Relative: 0 %
Eosinophils Absolute: 0 10*3/uL (ref 0.0–0.5)
Eosinophils Relative: 0 %
HCT: 32.7 % — ABNORMAL LOW (ref 39.0–52.0)
Hemoglobin: 11.5 g/dL — ABNORMAL LOW (ref 13.0–17.0)
Immature Granulocytes: 3 %
Lymphocytes Relative: 3 %
Lymphs Abs: 0.4 10*3/uL — ABNORMAL LOW (ref 0.7–4.0)
MCH: 32.6 pg (ref 26.0–34.0)
MCHC: 35.2 g/dL (ref 30.0–36.0)
MCV: 92.6 fL (ref 80.0–100.0)
Monocytes Absolute: 0.8 10*3/uL (ref 0.1–1.0)
Monocytes Relative: 6 %
Neutro Abs: 11.6 10*3/uL — ABNORMAL HIGH (ref 1.7–7.7)
Neutrophils Relative %: 88 %
Platelets: 281 10*3/uL (ref 150–400)
RBC: 3.53 MIL/uL — ABNORMAL LOW (ref 4.22–5.81)
RDW: 13 % (ref 11.5–15.5)
WBC: 13.3 10*3/uL — ABNORMAL HIGH (ref 4.0–10.5)
nRBC: 0 % (ref 0.0–0.2)

## 2024-10-14 LAB — URINALYSIS, ROUTINE W REFLEX MICROSCOPIC
Bilirubin Urine: NEGATIVE
Glucose, UA: 50 mg/dL — AB
Hgb urine dipstick: NEGATIVE
Ketones, ur: 20 mg/dL — AB
Leukocytes,Ua: NEGATIVE
Nitrite: NEGATIVE
Protein, ur: 30 mg/dL — AB
Specific Gravity, Urine: 1.015 (ref 1.005–1.030)
pH: 6 (ref 5.0–8.0)

## 2024-10-14 LAB — HEPATIC FUNCTION PANEL
ALT: 15 U/L (ref 3–53)
AST: 17 U/L (ref 5–37)
Albumin: 3.4 g/dL — ABNORMAL LOW (ref 3.5–5.2)
Alkaline Phosphatase: 83 U/L (ref 39–117)
Bilirubin, Direct: 0.1 mg/dL (ref 0.1–0.3)
Total Bilirubin: 0.6 mg/dL (ref 0.2–1.2)
Total Protein: 7.5 g/dL (ref 6.0–8.3)

## 2024-10-14 LAB — I-STAT CG4 LACTIC ACID, ED: Lactic Acid, Venous: 1 mmol/L (ref 0.5–1.9)

## 2024-10-14 LAB — TSH: TSH: 2.27 u[IU]/mL (ref 0.35–5.50)

## 2024-10-14 LAB — VITAMIN B12: Vitamin B-12: >1500 pg/mL — ABNORMAL HIGH (ref 211–911)

## 2024-10-14 LAB — IBC + FERRITIN
Ferritin: 164.5 ng/mL (ref 22.0–322.0)
Iron: 13 ug/dL — ABNORMAL LOW (ref 42–165)
Saturation Ratios: 4.7 % — ABNORMAL LOW (ref 20.0–50.0)
TIBC: 278.6 ug/dL (ref 250.0–450.0)
Transferrin: 199 mg/dL — ABNORMAL LOW (ref 212.0–360.0)

## 2024-10-14 LAB — BASIC METABOLIC PANEL WITH GFR
BUN: 16 mg/dL (ref 6–23)
CO2: 24 meq/L (ref 19–32)
Calcium: 8.9 mg/dL (ref 8.4–10.5)
Chloride: 85 meq/L — ABNORMAL LOW (ref 96–112)
Creatinine, Ser: 0.87 mg/dL (ref 0.40–1.50)
GFR: 82.76 mL/min
Glucose, Bld: 119 mg/dL — ABNORMAL HIGH (ref 70–99)
Potassium: 4.6 meq/L (ref 3.5–5.1)
Sodium: 119 meq/L — CL (ref 135–145)

## 2024-10-14 LAB — SODIUM, URINE, RANDOM: Sodium, Ur: <30 mmol/L

## 2024-10-14 LAB — POCT GLYCOSYLATED HEMOGLOBIN (HGB A1C): Hemoglobin A1C: 5.8 % — AB (ref 4.0–5.6)

## 2024-10-14 LAB — FOLATE: Folate: 12.7 ng/mL

## 2024-10-14 MED ORDER — AZITHROMYCIN 250 MG PO TABS
500.0000 mg | ORAL_TABLET | Freq: Every day | ORAL | Status: DC
  Administered 2024-10-14 – 2024-10-16 (×3): 500 mg via ORAL
  Filled 2024-10-14 (×3): qty 2

## 2024-10-14 MED ORDER — ONDANSETRON HCL 4 MG/2ML IJ SOLN
4.0000 mg | Freq: Four times a day (QID) | INTRAMUSCULAR | Status: DC | PRN
  Administered 2024-10-17: 4 mg via INTRAVENOUS
  Filled 2024-10-14: qty 2

## 2024-10-14 MED ORDER — PROMETHAZINE-DM 6.25-15 MG/5ML PO SYRP: 5.0000 mL | ORAL_SOLUTION | Freq: Four times a day (QID) | ORAL | 0 refills | Status: DC | PRN

## 2024-10-14 MED ORDER — ACETAMINOPHEN 325 MG PO TABS: 650.0000 mg | ORAL_TABLET | Freq: Four times a day (QID) | ORAL | Status: DC | PRN

## 2024-10-14 MED ORDER — CEFPODOXIME PROXETIL 200 MG PO TABS: 200.0000 mg | ORAL_TABLET | Freq: Two times a day (BID) | ORAL | 0 refills | Status: DC

## 2024-10-14 MED ORDER — HYDROCODONE-ACETAMINOPHEN 5-325 MG PO TABS
1.0000 | ORAL_TABLET | Freq: Four times a day (QID) | ORAL | Status: DC | PRN
  Administered 2024-10-15 – 2024-10-17 (×7): 1 via ORAL
  Filled 2024-10-14 (×7): qty 1

## 2024-10-14 MED ORDER — ACETAMINOPHEN 650 MG RE SUPP: 650.0000 mg | Freq: Four times a day (QID) | RECTAL | Status: DC | PRN

## 2024-10-14 MED ORDER — NALOXONE HCL 0.4 MG/ML IJ SOLN: 0.4000 mg | INTRAMUSCULAR | Status: DC | PRN

## 2024-10-14 MED ORDER — IOHEXOL 350 MG/ML SOLN
75.0000 mL | Freq: Once | INTRAVENOUS | Status: AC | PRN
  Administered 2024-10-14: 75 mL via INTRAVENOUS

## 2024-10-14 MED ORDER — MELATONIN 3 MG PO TABS
3.0000 mg | ORAL_TABLET | Freq: Every evening | ORAL | Status: DC | PRN
  Administered 2024-10-16 – 2024-10-17 (×2): 3 mg via ORAL
  Filled 2024-10-14 (×4): qty 1

## 2024-10-14 MED ORDER — SODIUM CHLORIDE 0.9 % IV BOLUS
1000.0000 mL | Freq: Once | INTRAVENOUS | Status: AC
  Administered 2024-10-14: 1000 mL via INTRAVENOUS

## 2024-10-14 MED ORDER — SODIUM CHLORIDE 0.9 % IV SOLN
1.0000 g | INTRAVENOUS | Status: DC
  Administered 2024-10-14 – 2024-10-16 (×3): 1 g via INTRAVENOUS
  Filled 2024-10-14 (×3): qty 10

## 2024-10-14 NOTE — ED Notes (Signed)
Phlebotomy at bedside getting blood cultures.  

## 2024-10-14 NOTE — Progress Notes (Unsigned)
 "  Subjective:  Patient ID: CHOSEN GESKE, male    DOB: [birth year]  Age: 79 y.o. MRN: [id number]  CC: Hospital Follow up  (1/19 for a fall was seen at [facility] yesterday and was diagnosed with bronchitis ), Annual Exam, and Hypertension   HPI JERRID FORGETTE presents for a CPX and f/up   Discussed the use of AI scribe software for clinical note transcription with the patient, who gave verbal consent to proceed.  History of Present Illness JACEION ADAY is a 79 year old male with emphysema who presents with left rib pain and shortness of breath following a fall.   He has been experiencing left rib pain and shortness of breath since a fall on January 19th. The pain is primarily located under the pacemaker and extends to the shoulder blade area. He is currently taking morphine  at night, which helps him sleep, and Tylenol  Extra Strength for pain management.  Significant shortness of breath is noted, especially when attempting to cough, which results in severe pain. He has been experiencing increased mucus production since September, which worsened after the fall. In early October, he developed an ear infection and was treated with prednisone  and Flonase, but these did not alleviate his symptoms. He is scheduled to have tubes placed in his ears on February 14th.  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 No hemoptysis is noted, but he mentions having a low-grade fever. He also reports dizziness, lack of energy, and constant sleepiness. He has no appetite and is unsteady on his legs, which he attributes to his current condition. He is concerned about the doubling of his rosuvastatin  dose and its potential effects on his symptoms.  Low sodium levels, recorded at 127, and low red blood cells were identified in a recent blood panel. He is currently taking Lexapro  for depression, which he started a couple of weeks ago, and  feels that his condition has worsened since the fall.     Outpatient Medications Prior to Visit  Medication Sig Dispense Refill   amoxicillin  (AMOXIL ) 500 MG tablet Take 4 tablets (2000 mg) by mouth one hour prior to dental procedure. 4 tablet 3   aspirin  EC 81 MG tablet Take 81 mg by mouth daily.     cilostazol  (PLETAL ) 50 MG tablet TAKE 1 TABLET(50 MG) BY MOUTH TWICE DAILY 180 tablet 3   cyanocobalamin  (VITAMIN B12) 1000 MCG tablet Take 1,000 mcg by mouth daily.     escitalopram  (LEXAPRO ) 5 MG tablet Take 1 tablet (5 mg total) by mouth daily. ADDITIONAL REFILLS FROM YOUR PRIMARY CARE 90 tablet 1   famotidine (PEPCID) 20 MG tablet Take 20 mg by mouth daily as needed for heartburn or indigestion.     finasteride  (PROSCAR ) 5 MG tablet Take 5 mg by mouth daily.     fluocinonide -emollient (LIDEX -E) 0.05 % cream Apply 1 Application topically 2 (two) times daily. 60 g 1   fluticasone (FLONASE) 50 MCG/ACT nasal spray Place 2 sprays into both nostrils daily.     hydrocortisone cream 1 % Apply 1 Application topically daily as needed for itching.     metoprolol  succinate (TOPROL -XL) 25 MG 24 hr tablet TAKE 1 TABLET(25 MG) BY MOUTH DAILY WITH OR IMMEDIATELY FOLLOWING A MEAL 90 tablet 3   predniSONE  (DELTASONE ) 10 MG tablet Take 3 tablets (30 mg total) by mouth daily with breakfast. 15 tablet 0   Propylene  Glycol (SYSTANE BALANCE) 0.6 % SOLN Place 1 drop into both eyes as needed (dry eyes).     rivaroxaban  (XARELTO ) 2.5 MG TABS tablet TAKE 1 TABLET(2.5 MG) BY MOUTH TWICE DAILY 60 tablet 5   rosuvastatin  (CRESTOR ) 10 MG tablet Take 1 tablet (10 mg total) by mouth daily. 90 tablet 3   tamsulosin  (FLOMAX ) 0.4 MG CAPS capsule Take by mouth.     telmisartan  (MICARDIS ) 80 MG tablet Take 0.5 tablets (40 mg total) by mouth in the morning and at bedtime. 90 tablet 3   Tiotropium Bromide-Olodaterol (STIOLTO RESPIMAT ) 2.5-2.5 MCG/ACT AERS Inhale 1 puff into the lungs daily.     azithromycin  (ZITHROMAX ) 250 MG  tablet Day 1: take 2 tablets. Day 2-5: Take 1 tablet daily. 6 tablet 0   No facility-administered medications prior to visit.    ROS Review of Systems  Constitutional:  Positive for activity change, appetite change and fatigue. Negative for chills, diaphoresis and unexpected weight change.  HENT:  Positive for hearing loss. Negative for trouble swallowing.   Respiratory:  Positive for cough and shortness of breath. Negative for chest tightness and wheezing.   Cardiovascular:  Negative for chest pain, palpitations and leg swelling.  Gastrointestinal:  Negative for abdominal pain, constipation, diarrhea, nausea and vomiting.  Endocrine: Negative for polydipsia, polyphagia and polyuria.  Genitourinary:  Negative for difficulty urinating and dysuria.  Musculoskeletal: Negative.   Skin: Negative.   Neurological: Negative.  Negative for dizziness, speech difficulty, weakness, light-headedness, numbness and headaches.  Hematological:  Negative for adenopathy. Does not bruise/bleed easily.  Psychiatric/Behavioral:  Positive for confusion and decreased concentration.     Objective:  BP (!) 166/86 (BP Location: Left Arm, Patient Position: Sitting, Cuff Size: Normal)   Pulse 82   Temp 98.4 F (36.9 C) (Oral)   Resp 16   Ht 5' 7 (1.702 m)   Wt 172 lb 12.8 oz (78.4 kg)   SpO2 95%   BMI 27.06 kg/m   BP Readings from Last 3 Encounters:  10/14/24 (!) 166/86  10/13/24 (!) 175/74  10/06/24 (!) 192/87    Wt Readings from Last 3 Encounters:  10/14/24 172 lb 12.8 oz (78.4 kg)  10/06/24 170 lb (77.1 kg)  10/02/24 170 lb 12.8 oz (77.5 kg)    Physical Exam Vitals reviewed.  Constitutional:      General: He is not in acute distress.    Appearance: He is not toxic-appearing or diaphoretic.  HENT:     Nose: Nose normal.     Mouth/Throat:     Mouth: Mucous membranes are moist.  Eyes:     General: No scleral icterus.    Conjunctiva/sclera: Conjunctivae normal.  Cardiovascular:     Rate  and Rhythm: Normal rate and regular rhythm.     Heart sounds: Murmur heard.     Systolic murmur is present with a grade of 1/6.     No friction rub. No gallop.  Pulmonary:     Breath sounds: Examination of the right-lower field reveals rales. Examination of the left-lower field reveals rales. Rales present. No decreased breath sounds, wheezing or rhonchi.  Abdominal:     General: Abdomen is flat.     Palpations: There is no mass.     Tenderness: There is no abdominal tenderness. There is no guarding.     Hernia: No hernia is present.  Musculoskeletal:        General: Normal range of motion.     Cervical back: Neck supple.  Right lower leg: No edema.     Left lower leg: No edema.  Lymphadenopathy:     Cervical: No cervical adenopathy.  Skin:    General: Skin is warm and dry.     Coloration: Skin is not jaundiced.  Neurological:     General: No focal deficit present.     Mental Status: He is alert.  Psychiatric:        Mood and Affect: Mood normal.        Behavior: Behavior normal.     Lab Results  Component Value Date   WBC 7.5 10/06/2024   HGB 13.0 10/06/2024   HCT 38.4 (L) 10/06/2024   PLT 246 10/06/2024   GLUCOSE 119 (H) 10/14/2024   CHOL 137 04/02/2024   TRIG 36.0 04/02/2024   HDL 66.50 04/02/2024   LDLCALC 63 04/02/2024   ALT 15 10/14/2024   AST 17 10/14/2024   NA 119 (LL) 10/14/2024   K 4.6 10/14/2024   CL 85 (L) 10/14/2024   CREATININE 0.87 10/14/2024   BUN 16 10/14/2024   CO2 24 10/14/2024   TSH 2.27 10/14/2024   PSA 3.90 09/13/2023   INR 1.1 07/09/2023   HGBA1C 5.8 (A) 10/14/2024   DG Chest 2 View Result Date: 10/14/2024 EXAM: 2 VIEW(S) XRAY OF THE CHEST 10/14/2024 03:45:21 PM COMPARISON: 10/06/2024 CLINICAL HISTORY: Fall. FINDINGS: LUNGS AND PLEURA: Pulmonary interstitial prominence with mild pulmonary edema. Patchy right alveolar process could represent asymmetric edema or pneumonia. Small right pleural effusion. No pneumothorax. HEART AND  MEDIASTINUM: Cardiomegaly. Post TAVR. Left bipolar pacer. Calcified aorta. BONES AND SOFT TISSUES: No acute osseous abnormality. IMPRESSION: 1. Mild pulmonary edema. 2. Patchy right alveolar process, possibly representing asymmetric edema or pneumonia. 3. Small right pleural effusion. Electronically signed by: Elsie Gravely MD 10/14/2024 04:23 PM EST RP Workstation: [id number]   DG Ribs Unilateral Left Result Date: 10/14/2024 EXAM: 1 VIEW XRAY OF THE LEFT RIBS 10/14/2024 03:45:21 PM COMPARISON: None available. CLINICAL HISTORY: Fall. FINDINGS: BONES: No acute displaced rib fracture. LUNGS AND PLEURA: Visualized lungs demonstrate no acute abnormality. LINES AND TUBES: Left subclavian dual lead pacemaker in place. HEART AND MEDIASTINUM: Status post TAVR. Aortic calcification. IMPRESSION: 1. No acute displaced left rib fracture. Electronically signed by: Elsie Gravely MD 10/14/2024 04:22 PM EST RP Workstation: [id number]   DG Scapula Left Result Date: 10/14/2024 EXAM: 1 VIEW XRAY OF THE LEFT SCAPULA 10/14/2024 03:45:21 PM COMPARISON: None available. CLINICAL HISTORY: Fall. FINDINGS: BONES AND JOINTS: No acute fracture. No malalignment. No dislocation. No focal bone lesion or bone destruction is identified. The bone cortex appears intact. Mild degenerative changes are present in the glenohumeral joint. SOFT TISSUES: The generator for the cardiac pacemaker overlies the scapula. The soft tissues are unremarkable. IMPRESSION: 1. No evidence of acute fracture or dislocation. 2. Mild degenerative changes in the glenohumeral joint. Electronically signed by: Elsie Gravely MD 10/14/2024 04:21 PM EST RP Workstation: [id number]      Assessment & Plan:  Left-sided chest wall pain -     DG Chest 2 View; Future -     DG Ribs Unilateral Left; Future  Pain of left scapula -     DG Scapula Left; Future  SIADH (syndrome of inappropriate ADH production) -     Sodium, urine, random; Future -     Basic metabolic  panel with GFR; Future  Encounter for general adult medical examination with abnormal findings  Primary hypertension -     TSH; Future -     Basic  metabolic panel with GFR; Future  Deficiency anemia -     IBC + Ferritin; Future -     Reticulocytes; Future -     Vitamin B1; Future -     Zinc ; Future -     Vitamin B12; Future -     Folate; Future  Hyperlipidemia with target LDL less than 70 -     TSH; Future -     Hepatic function panel; Future  B12 deficiency -     Vitamin B12; Future -     Folate; Future  Bilateral rales  Abnormal CXR (chest x-ray)  Chronic hyperglycemia -     POCT glycosylated hemoglobin (Hb A1C)  Pneumonia of right lung due to infectious organism, unspecified part of lung -     Cefpodoxime  Proxetil; Take 1 tablet (200 mg total) by mouth 2 (two) times daily for 10 days.  Dispense: 20 tablet; Refill: 0 -     Promethazine -DM; Take 5 mLs by mouth 4 (four) times daily as needed for cough.  Dispense: 118 mL; Refill: 0  Iron deficiency anemia secondary to inadequate dietary iron intake     Follow-up: Return in about 3 months (around 01/12/2025).  Debby Molt, MD "

## 2024-10-14 NOTE — ED Triage Notes (Signed)
 Patient went to PCP to be evaluated after a fall and was found to have low sodium and PNA Complains of SOB.

## 2024-10-14 NOTE — ED Notes (Signed)
 Asked phlebotomy assistance with collecting blood cultures prior to giving abx

## 2024-10-14 NOTE — Progress Notes (Signed)
 Please ask him to go to the ED for low sodium

## 2024-10-14 NOTE — ED Provider Notes (Signed)
 " [facility] Provider Note   CSN: [id number] Arrival date & time: 10/14/24  8182     Patient presents with: low sodium    Brian Hunt is a 79 y.o. male.   The history is provided by the patient and medical records. No language interpreter was used.  Illness Location:  Generalized fatigue, falls, cough, pneumonia on x-ray Severity:  Moderate Onset quality:  Gradual Duration:  4 days Timing:  Constant Progression:  Waxing and waning Chronicity:  New Associated symptoms: chest pain, cough, fatigue and shortness of breath   Associated symptoms: no abdominal pain, no congestion, no diarrhea, no fever, no headaches, no loss of consciousness, no nausea, no rash, no vomiting and no wheezing        Prior to Admission medications  Medication Sig Start Date End Date Taking? Authorizing Provider  amoxicillin  (AMOXIL ) 500 MG tablet Take 4 tablets (2000 mg) by mouth one hour prior to dental procedure. 07/23/24   Sebastian Lamarr SAUNDERS, PA-C  aspirin  EC 81 MG tablet Take 81 mg by mouth daily.    [provider]  cefpodoxime  (VANTIN ) 200 MG tablet Take 1 tablet (200 mg total) by mouth 2 (two) times daily for 10 days. 10/14/24 10/24/24  Joshua Debby CROME, MD  cilostazol  (PLETAL ) 50 MG tablet TAKE 1 TABLET(50 MG) BY MOUTH TWICE DAILY 08/29/24   Court Dorn PARAS, MD  cyanocobalamin  (VITAMIN B12) 1000 MCG tablet Take 1,000 mcg by mouth daily.    [provider]  escitalopram  (LEXAPRO ) 5 MG tablet Take 1 tablet (5 mg total) by mouth daily. ADDITIONAL REFILLS FROM YOUR PRIMARY CARE 10/02/24   Raford Riggs, MD  famotidine (PEPCID) 20 MG tablet Take 20 mg by mouth daily as needed for heartburn or indigestion.    [provider]  finasteride  (PROSCAR ) 5 MG tablet Take 5 mg by mouth daily. 10/17/23   [provider]  fluocinonide -emollient (LIDEX -E) 0.05 % cream Apply 1 Application topically 2 (two) times daily. 02/19/24    Joshua Debby CROME, MD  fluticasone (FLONASE) 50 MCG/ACT nasal spray Place 2 sprays into both nostrils daily.    [provider]  hydrocortisone cream 1 % Apply 1 Application topically daily as needed for itching.    [provider]  metoprolol  succinate (TOPROL -XL) 25 MG 24 hr tablet TAKE 1 TABLET(25 MG) BY MOUTH DAILY WITH OR IMMEDIATELY FOLLOWING A MEAL 05/02/24   Raford Riggs, MD  predniSONE  (DELTASONE ) 10 MG tablet Take 3 tablets (30 mg total) by mouth daily with breakfast. 10/13/24   Kwan Shellhammer [place], PA-C  promethazine -dextromethorphan (PROMETHAZINE -DM) 6.25-15 MG/5ML syrup Take 5 mLs by mouth 4 (four) times daily as needed for cough. 10/14/24   Joshua Debby CROME, MD  Propylene Glycol (SYSTANE BALANCE) 0.6 % SOLN Place 1 drop into both eyes as needed (dry eyes).    [provider]  rivaroxaban  (XARELTO ) 2.5 MG TABS tablet TAKE 1 TABLET(2.5 MG) BY MOUTH TWICE DAILY 08/26/24   Raford Riggs, MD  rosuvastatin  (CRESTOR ) 10 MG tablet Take 1 tablet (10 mg total) by mouth daily. 10/02/24   Raford Riggs, MD  tamsulosin  (FLOMAX ) 0.4 MG CAPS capsule Take by mouth. 08/05/24   [provider]  telmisartan  (MICARDIS ) 80 MG tablet Take 0.5 tablets (40 mg total) by mouth in the morning and at bedtime. 03/20/24   Raford Riggs, MD  Tiotropium Bromide-Olodaterol (STIOLTO RESPIMAT ) 2.5-2.5 MCG/ACT AERS Inhale 1 puff into the lungs daily.    [provider]  Allergies: Demeclocycline, Pravastatin , Lisinopril , and Oxycodone     Review of Systems  Constitutional:  Positive for chills and fatigue. Negative for fever.  HENT:  Negative for congestion.   Respiratory:  Positive for cough, chest tightness and shortness of breath. Negative for wheezing.   Cardiovascular:  Positive for chest pain.  Gastrointestinal:  Negative for abdominal pain, constipation, diarrhea, nausea and vomiting.  Genitourinary:  Negative for dysuria and flank pain.  Musculoskeletal:   Negative for back pain, neck pain and neck stiffness.  Skin:  Negative for rash and wound.  Neurological:  Positive for light-headedness. Negative for dizziness, loss of consciousness and headaches.  Psychiatric/Behavioral:  Negative for agitation and confusion.   All other systems reviewed and are negative.   Updated Vital Signs BP (!) 166/89   Pulse 75   Temp (!) 97.5 F (36.4 C)   Resp (!) 23   Ht 5' 7 (1.702 m)   Wt 78.4 kg   SpO2 91%   BMI 27.07 kg/m   Physical Exam Vitals and nursing note reviewed.  Constitutional:      General: He is not in acute distress.    Appearance: He is well-developed. He is not ill-appearing, toxic-appearing or diaphoretic.  HENT:     Head: Normocephalic and atraumatic.     Nose: No congestion or rhinorrhea.     Mouth/Throat:     Mouth: Mucous membranes are dry.     Pharynx: No oropharyngeal exudate or posterior oropharyngeal erythema.  Eyes:     Extraocular Movements: Extraocular movements intact.     Conjunctiva/sclera: Conjunctivae normal.     Pupils: Pupils are equal, round, and reactive to light.  Cardiovascular:     Rate and Rhythm: Normal rate and regular rhythm.     Pulses: Normal pulses.     Heart sounds: No murmur heard. Pulmonary:     Effort: Pulmonary effort is normal. No respiratory distress.     Breath sounds: Rhonchi present. No wheezing or rales.  Chest:     Chest wall: No tenderness.  Abdominal:     Palpations: Abdomen is soft.     Tenderness: There is no abdominal tenderness.  Musculoskeletal:        General: Tenderness present. No swelling.     Cervical back: Neck supple. No tenderness.  Skin:    General: Skin is warm and dry.     Capillary Refill: Capillary refill takes less than 2 seconds.     Findings: No erythema or rash.  Neurological:     Mental Status: He is alert.     Sensory: No sensory deficit.     Motor: No weakness.  Psychiatric:        Mood and Affect: Mood normal.     (all labs ordered are  listed, but only abnormal results are displayed) Labs Reviewed  CBC WITH DIFFERENTIAL/PLATELET - Abnormal; Notable for the following components:      Result Value   WBC 13.3 (*)    RBC 3.53 (*)    Hemoglobin 11.5 (*)    HCT 32.7 (*)    Neutro Abs 11.6 (*)    Lymphs Abs 0.4 (*)    Abs Immature Granulocytes 0.40 (*)    All other components within normal limits  COMPREHENSIVE METABOLIC PANEL WITH GFR - Abnormal; Notable for the following components:   Sodium 118 (*)    Chloride 85 (*)    Glucose, Bld 156 (*)    Calcium  8.8 (*)    Albumin 3.2 (*)  All other components within normal limits  URINALYSIS, ROUTINE W REFLEX MICROSCOPIC - Abnormal; Notable for the following components:   Glucose, UA 50 (*)    Ketones, ur 20 (*)    Protein, ur 30 (*)    Bacteria, UA RARE (*)    All other components within normal limits  CULTURE, BLOOD (ROUTINE X 2)  CULTURE, BLOOD (ROUTINE X 2)  SODIUM, URINE, RANDOM  CBC WITH DIFFERENTIAL/PLATELET  COMPREHENSIVE METABOLIC PANEL WITH GFR  MAGNESIUM   MAGNESIUM   PHOSPHORUS  CREATININE, URINE, RANDOM  OSMOLALITY  OSMOLALITY, URINE  T4, FREE  TSH  URIC ACID  BASIC METABOLIC PANEL WITH GFR  PROCALCITONIN  I-STAT CG4 LACTIC ACID, ED    EKG: None  Radiology: DG Chest 2 View Result Date: 10/14/2024 EXAM: 2 VIEW(S) XRAY OF THE CHEST 10/14/2024 03:45:21 PM COMPARISON: 10/06/2024 CLINICAL HISTORY: Fall. FINDINGS: LUNGS AND PLEURA: Pulmonary interstitial prominence with mild pulmonary edema. Patchy right alveolar process could represent asymmetric edema or pneumonia. Small right pleural effusion. No pneumothorax. HEART AND MEDIASTINUM: Cardiomegaly. Post TAVR. Left bipolar pacer. Calcified aorta. BONES AND SOFT TISSUES: No acute osseous abnormality. IMPRESSION: 1. Mild pulmonary edema. 2. Patchy right alveolar process, possibly representing asymmetric edema or pneumonia. 3. Small right pleural effusion. Electronically signed by: Elsie Gravely MD  10/14/2024 04:23 PM EST RP Workstation: [id number]   DG Ribs Unilateral Left Result Date: 10/14/2024 EXAM: 1 VIEW XRAY OF THE LEFT RIBS 10/14/2024 03:45:21 PM COMPARISON: None available. CLINICAL HISTORY: Fall. FINDINGS: BONES: No acute displaced rib fracture. LUNGS AND PLEURA: Visualized lungs demonstrate no acute abnormality. LINES AND TUBES: Left subclavian dual lead pacemaker in place. HEART AND MEDIASTINUM: Status post TAVR. Aortic calcification. IMPRESSION: 1. No acute displaced left rib fracture. Electronically signed by: Elsie Gravely MD 10/14/2024 04:22 PM EST RP Workstation: [id number]   DG Scapula Left Result Date: 10/14/2024 EXAM: 1 VIEW XRAY OF THE LEFT SCAPULA 10/14/2024 03:45:21 PM COMPARISON: None available. CLINICAL HISTORY: Fall. FINDINGS: BONES AND JOINTS: No acute fracture. No malalignment. No dislocation. No focal bone lesion or bone destruction is identified. The bone cortex appears intact. Mild degenerative changes are present in the glenohumeral joint. SOFT TISSUES: The generator for the cardiac pacemaker overlies the scapula. The soft tissues are unremarkable. IMPRESSION: 1. No evidence of acute fracture or dislocation. 2. Mild degenerative changes in the glenohumeral joint. Electronically signed by: Elsie Gravely MD 10/14/2024 04:21 PM EST RP Workstation: [id number]     Procedures    CRITICAL CARE Performed by: Lonni PARAS Reade Trefz Total critical care time: 30 minutes Critical care time was exclusive of separately billable procedures and treating other patients. Critical care was necessary to treat or prevent imminent or life-threatening deterioration. Critical care was time spent personally by me on the following activities: development of treatment plan with patient and/or surrogate as well as nursing, discussions with consultants, evaluation of patient's response to treatment, examination of patient, obtaining history from patient or surrogate, ordering and  performing treatments and interventions, ordering and review of laboratory studies, ordering and review of radiographic studies, pulse oximetry and re-evaluation of patient's condition.  Medications Ordered in the ED  cefTRIAXone  (ROCEPHIN ) 1 g in sodium chloride  0.9 % 100 mL IVPB (has no administration in time range)  azithromycin  (ZITHROMAX ) tablet 500 mg (has no administration in time range)  acetaminophen  (TYLENOL ) tablet 650 mg (has no administration in time range)    Or  acetaminophen  (TYLENOL ) suppository 650 mg (has no administration in time range)  melatonin tablet 3 mg (has no  administration in time range)  ondansetron  (ZOFRAN ) injection 4 mg (has no administration in time range)  naloxone  (NARCAN ) injection 0.4 mg (has no administration in time range)  HYDROcodone -acetaminophen  (NORCO/VICODIN) 5-325 MG per tablet 1 tablet (has no administration in time range)  iohexol  (OMNIPAQUE ) 350 MG/ML injection 75 mL (75 mLs Intravenous Contrast Given 10/14/24 2104)  sodium chloride  0.9 % bolus 1,000 mL (0 mLs Intravenous Stopped 10/14/24 2310)                                    Medical Decision Making Risk Decision regarding hospitalization.    LAUREL SMELTZ is a 79 y.o. male with a past medical history significant for complete heart block status post pacemaker, anxiety, COPD, hypertension, hyperlipidemia, diverticulosis previous TAVR, and previous hyponatremia who presents with hyponatremia, falls, lightheadedness, intermittent confusion, and pneumonia with cough and shortness of breath.  According to patient, he was diagnosed with pneumonia earlier today and was told to come into the hospital given the hyponatremia they found.  He had a fall several days ago and was told he may have a left-sided rib fracture.  Subsequent x-ray did not show the rib fracture but showed bronchitis and then pneumonia.  He reports he still having shortness of breath and some cough and oxygen  saturations have  been in the low 90s at times.  He denies hitting his head and has no headache or neck pain but feels that his sodium is low like it has been in the past.  He went and was seen earlier today and was told to come in due to hyponatremia being less than 120.  On my exam, lungs had some coarseness.  Left lateral chest was tender but anterior chest was nontender.  Abdomen nontender.  Moving all extremities.  Legs nontender and not critically edematous.  Patient had no focal deficits initially.  Patient resting.  Patient had a CT PE study ordered in triage that did show a left-sided rib fracture and showed pneumonia.  Did not show pneumothorax.  Sodium was indeed found to be 118.  He will need admission for pneumonia treatment, hyponatremia treatment, and further rehydration.  Was given a small amount of fluids to start for the hyponatremia so as not to correct too fast and cause central pontine myelinolysis.  Otherwise his urinalysis did not show nitrites or leukocytes, doubt UTI.  Will call for admission for further management.     Final diagnoses:  Hyponatremia  Fatigue, unspecified type  Falls  Pneumonia due to infectious organism, unspecified laterality, unspecified part of lung      Clinical Impression: 1. Hyponatremia   2. Fatigue, unspecified type   3. Falls   4. Pneumonia due to infectious organism, unspecified laterality, unspecified part of lung     Disposition: Admit  This note was prepared with assistance of Dragon voice recognition software. Occasional wrong-word or sound-a-like substitutions may have occurred due to the inherent limitations of voice recognition software.      Tvisha Schwoerer, Lonni PARAS, MD 10/14/24 [phone]  "

## 2024-10-14 NOTE — Patient Instructions (Signed)
 Health Maintenance, Male  Adopting a healthy lifestyle and getting preventive care are important in promoting health and wellness. Ask your health care provider about:  The right schedule for you to have regular tests and exams.  Things you can do on your own to prevent diseases and keep yourself healthy.  What should I know about diet, weight, and exercise?  Eat a healthy diet    Eat a diet that includes plenty of vegetables, fruits, low-fat dairy products, and lean protein.  Do not eat a lot of foods that are high in solid fats, added sugars, or sodium.  Maintain a healthy weight  Body mass index (BMI) is a measurement that can be used to identify possible weight problems. It estimates body fat based on height and weight. Your health care provider can help determine your BMI and help you achieve or maintain a healthy weight.  Get regular exercise  Get regular exercise. This is one of the most important things you can do for your health. Most adults should:  Exercise for at least 150 minutes each week. The exercise should increase your heart rate and make you sweat (moderate-intensity exercise).  Do strengthening exercises at least twice a week. This is in addition to the moderate-intensity exercise.  Spend less time sitting. Even light physical activity can be beneficial.  Watch cholesterol and blood lipids  Have your blood tested for lipids and cholesterol at 79 years of age, then have this test every 5 years.  You may need to have your cholesterol levels checked more often if:  Your lipid or cholesterol levels are high.  You are older than 79 years of age.  You are at high risk for heart disease.  What should I know about cancer screening?  Many types of cancers can be detected early and may often be prevented. Depending on your health history and family history, you may need to have cancer screening at various ages. This may include screening for:  Colorectal cancer.  Prostate cancer.  Skin cancer.  Lung  cancer.  What should I know about heart disease, diabetes, and high blood pressure?  Blood pressure and heart disease  High blood pressure causes heart disease and increases the risk of stroke. This is more likely to develop in people who have high blood pressure readings or are overweight.  Talk with your health care provider about your target blood pressure readings.  Have your blood pressure checked:  Every 3-5 years if you are 24-52 years of age.  Every year if you are 3 years old or older.  If you are between the ages of 60 and 72 and are a current or former smoker, ask your health care provider if you should have a one-time screening for abdominal aortic aneurysm (AAA).  Diabetes  Have regular diabetes screenings. This checks your fasting blood sugar level. Have the screening done:  Once every three years after age 66 if you are at a normal weight and have a low risk for diabetes.  More often and at a younger age if you are overweight or have a high risk for diabetes.  What should I know about preventing infection?  Hepatitis B  If you have a higher risk for hepatitis B, you should be screened for this virus. Talk with your health care provider to find out if you are at risk for hepatitis B infection.  Hepatitis C  Blood testing is recommended for:  Everyone born from [birth year] through 1965.  Anyone  with known risk factors for hepatitis C.  Sexually transmitted infections (STIs)  You should be screened each year for STIs, including gonorrhea and chlamydia, if:  You are sexually active and are younger than 79 years of age.  You are older than 79 years of age and your health care provider tells you that you are at risk for this type of infection.  Your sexual activity has changed since you were last screened, and you are at increased risk for chlamydia or gonorrhea. Ask your health care provider if you are at risk.  Ask your health care provider about whether you are at high risk for HIV. Your health care provider  may recommend a prescription medicine to help prevent HIV infection. If you choose to take medicine to prevent HIV, you should first get tested for HIV. You should then be tested every 3 months for as long as you are taking the medicine.  Follow these instructions at home:  Alcohol use  Do not drink alcohol if your health care provider tells you not to drink.  If you drink alcohol:  Limit how much you have to 0-2 drinks a day.  Know how much alcohol is in your drink. In the [place] one drink equals one 12 oz bottle of beer (355 mL), one 5 oz glass of wine (148 mL), or one 1 oz glass of hard liquor (44 mL).  Lifestyle  Do not use any products that contain nicotine or tobacco. These products include cigarettes, chewing tobacco, and vaping devices, such as e-cigarettes. If you need help quitting, ask your health care provider.  Do not use street drugs.  Do not share needles.  Ask your health care provider for help if you need support or information about quitting drugs.  General instructions  Schedule regular health, dental, and eye exams.  Stay current with your vaccines.  Tell your health care provider if:  You often feel depressed.  You have ever been abused or do not feel safe at home.  Summary  Adopting a healthy lifestyle and getting preventive care are important in promoting health and wellness.  Follow your health care provider's instructions about healthy diet, exercising, and getting tested or screened for diseases.  Follow your health care provider's instructions on monitoring your cholesterol and blood pressure.  This information is not intended to replace advice given to you by your health care provider. Make sure you discuss any questions you have with your health care provider.  Document Revised: 01/24/2021 Document Reviewed: 01/24/2021  Elsevier Patient Education  2024 [employer].

## 2024-10-14 NOTE — ED Notes (Signed)
 Pt taken for CT

## 2024-10-14 NOTE — Telephone Encounter (Signed)
 Patient is scheduled for an appointment today to see Dr. Joshua

## 2024-10-14 NOTE — Telephone Encounter (Signed)
 CRITICAL VALUE STICKER  CRITICAL VALUE: 119.2 Sodium  RECEIVER (on-site recipient of call): Kayleen Alig  DATE & TIME NOTIFIED: 05:03 pm 10/14/2024  MESSENGER (representative from lab):  MD NOTIFIED: Debby Molt  TIME OF NOTIFICATION: 05:04  RESPONSE:  Awaiting PCP response

## 2024-10-14 NOTE — H&P (Incomplete)
 " History and Physical      Brian Hunt FMW:[id number] DOB: 06-08-1946 DOA: 10/14/2024; DOS: 10/14/2024  PCP: Joshua Debby CROME, MD *** Patient coming from: home ***  I have personally briefly reviewed patient's old medical records in [facility]  Chief Complaint: ***  HPI: Brian Hunt is a 79 y.o. male with medical history significant for *** who is admitted to [facility] on 10/14/2024 with *** after presenting from home*** to [facility] ED complaining of ***.   ***       ***   ED Course:  Vital signs in the ED were notable for the following: ***  Labs were notable for the following: ***  Per my interpretation, EKG in ED demonstrated the following:  ***  Imaging in the ED, per corresponding formal radiology read, was notable for the following:  ***  While in the ED, the following were administered: ***  Subsequently, the patient was admitted  ***  ***red    Review of Systems: As per HPI otherwise 10 point review of systems negative.   Past Medical History:  Diagnosis Date   Anemia    Anxiety    Aortic stenosis, severe 03/09/2018              Reading Physician    Reading Date    Result Priority      Shlomo Wilbert SAUNDERS, MD [phone]    07/06/2021    Routine     Narrative & Impression       The study is normal. The study is low risk.    RBBB with repolarization changes was present at baseline with no ST changes from baseline during infusion.  Occsaional PVCs were noted.    LV perfusion is normal. There is no evidence of is   Arthritis    Basal cell carcinoma    Basal Cell X2 , Dr Shlomo   BPH (benign prostatic hyperplasia)    Bursitis of left hip    Dr. 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   s/p TAVR with a 29 mm [place] S3UR via the TF approach by Drs Wonda ODESSIA Fellers    Past Surgical History:  Procedure Laterality Date   COLONOSCOPY  2010   negative; [facility]   CYSTOSCOPY  10/2014   [facility]; neg   dislocation of right hip  2019   GREEN LIGHT LASER TURP (TRANSURETHRAL RESECTION OF PROSTATE  09/27/2012   Procedure: GREEN LIGHT LASER TURP (TRANSURETHRAL RESECTION OF PROSTATE;  Surgeon: Donnice Gwenyth Brooks, MD;  Location: [facility] ORS;  Service: Urology;  Laterality: N/A;      INTRAOPERATIVE TRANSTHORACIC ECHOCARDIOGRAM N/A 07/10/2023   Procedure: INTRAOPERATIVE TRANSTHORACIC ECHOCARDIOGRAM;  Surgeon: Wonda Sharper, MD;  Location: [facility] INVASIVE CV LAB;  Service: Cardiovascular;  Laterality: N/A;   JOINT REPLACEMENT Left Oct. 12, 2012   left total  hip replacement by Dr. Liam   PACEMAKER IMPLANT N/A 07/11/2023   Procedure: PACEMAKER IMPLANT;  Surgeon: Cindie Ole DASEN, MD;  Location: [facility] INVASIVE CV LAB;  Service: Cardiovascular;  Laterality: N/A;   PROSTATE BIOPSY  2007 , 2009   X2 , Dr Terence  & Dr Andra   PROSTATE SURGERY     Laser   RIGHT/LEFT HEART CATH AND CORONARY ANGIOGRAPHY N/A 06/18/2023   Procedure: RIGHT/LEFT HEART CATH AND CORONARY ANGIOGRAPHY;  Surgeon: Wendel Lurena POUR, MD;  Location: [facility] INVASIVE CV LAB;  Service: Cardiovascular;  Laterality: N/A;   TEMPORARY PACEMAKER Right 07/10/2023   Procedure: TEMPORARY PACEMAKER;  Surgeon: Wonda Sharper, MD;  Location: [facility] INVASIVE CV LAB;  Service: Cardiovascular;  Laterality: Right;   TOTAL HIP ARTHROPLASTY Right 09/26/2017   TOTAL HIP ARTHROPLASTY Right 09/26/2017   Procedure: TOTAL HIP ARTHROPLASTY ANTERIOR APPROACH;   Surgeon: Liam Lerner, MD;  Location: [facility] OR;  Service: Orthopedics;  Laterality: Right;   TOTAL SHOULDER ARTHROPLASTY Right 08/17/2022   Procedure: TOTAL SHOULDER ARTHROPLASTY WITH AUGMENTED GLENOID;  Surgeon: Dozier Soulier, MD;  Location: [facility] ORS;  Service: Orthopedics;  Laterality: Right;   TRANSCATHETER AORTIC VALVE REPLACEMENT,SUBCLAVIAN Left 07/10/2023   Procedure: Possible Transcatheter Aortic Valve Replacement-Left Subclavian;  Surgeon: Wonda Sharper, MD;  Location: [facility] INVASIVE CV LAB;  Service: Cardiovascular;  Laterality: Left;    Social History:  reports that he quit smoking about 43 years ago. His smoking use included cigarettes. He started smoking about 3 years ago. He has a 1.7 pack-year smoking history. He has never used smokeless tobacco. He reports that he does not drink alcohol and does not use drugs.   Allergies[1]  Family History  Problem Relation Age of Onset   Arthritis Mother    Vascular Disease Mother        Varicose Veins   Hypertension Mother    Hyperlipidemia Mother    Transient ischemic attack Mother    Heart disease Mother        After age 50   Stroke Mother    COPD Father    Bone cancer Maternal Grandmother    Heart attack Paternal Grandmother 76   Colon cancer Son 17   Turner syndrome Daughter    Asthma Neg Hx    Diabetes Neg Hx    Pancreatic cancer Neg Hx    Esophageal cancer Neg Hx    Stomach cancer Neg Hx    Liver disease Neg Hx    Rectal cancer Neg Hx     Family history reviewed and not pertinent ***   Prior to Admission medications  Medication Sig Start Date End Date Taking? Authorizing Provider  amoxicillin  (AMOXIL ) 500 MG tablet Take 4 tablets (2000 mg) by mouth one hour prior to dental procedure. 07/23/24   Sebastian Lamarr SAUNDERS, PA-C  aspirin  EC 81 MG tablet Take 81 mg by mouth daily.    [provider]  cefpodoxime  (VANTIN ) 200 MG tablet Take 1 tablet (200 mg total) by mouth 2 (two) times daily for 10 days. 10/14/24 10/24/24   Joshua Debby CROME, MD  cilostazol  (PLETAL ) 50 MG tablet TAKE 1 TABLET(50 MG) BY MOUTH TWICE DAILY 08/29/24   Court Dorn PARAS, MD  cyanocobalamin  (VITAMIN B12) 1000 MCG tablet Take 1,000 mcg by mouth daily.    [provider]  escitalopram  (LEXAPRO ) 5 MG tablet Take 1 tablet (5 mg total) by mouth daily. ADDITIONAL REFILLS FROM YOUR PRIMARY CARE 10/02/24   Raford Riggs, MD  famotidine (PEPCID) 20 MG tablet  Take 20 mg by mouth daily as needed for heartburn or indigestion.    [provider]  finasteride  (PROSCAR ) 5 MG tablet Take 5 mg by mouth daily. 10/17/23   [provider]  fluocinonide -emollient (LIDEX -E) 0.05 % cream Apply 1 Application topically 2 (two) times daily. 02/19/24   Joshua Debby CROME, MD  fluticasone (FLONASE) 50 MCG/ACT nasal spray Place 2 sprays into both nostrils daily.    [provider]  hydrocortisone cream 1 % Apply 1 Application topically daily as needed for itching.    [provider]  metoprolol  succinate (TOPROL -XL) 25 MG 24 hr tablet TAKE 1 TABLET(25 MG) BY MOUTH DAILY WITH OR IMMEDIATELY FOLLOWING A MEAL 05/02/24   Raford Riggs, MD  predniSONE  (DELTASONE ) 10 MG tablet Take 3 tablets (30 mg total) by mouth daily with breakfast. 10/13/24   Christopher Savannah, PA-C  promethazine -dextromethorphan (PROMETHAZINE -DM) 6.25-15 MG/5ML syrup Take 5 mLs by mouth 4 (four) times daily as needed for cough. 10/14/24   Joshua Debby CROME, MD  Propylene Glycol (SYSTANE BALANCE) 0.6 % SOLN Place 1 drop into both eyes as needed (dry eyes).    [provider]  rivaroxaban  (XARELTO ) 2.5 MG TABS tablet TAKE 1 TABLET(2.5 MG) BY MOUTH TWICE DAILY 08/26/24   Raford Riggs, MD  rosuvastatin  (CRESTOR ) 10 MG tablet Take 1 tablet (10 mg total) by mouth daily. 10/02/24   Raford Riggs, MD  tamsulosin  (FLOMAX ) 0.4 MG CAPS capsule Take by mouth. 08/05/24   [provider]  telmisartan  (MICARDIS ) 80 MG tablet Take 0.5 tablets (40 mg total) by  mouth in the morning and at bedtime. 03/20/24   Raford Riggs, MD  Tiotropium Bromide-Olodaterol (STIOLTO RESPIMAT ) 2.5-2.5 MCG/ACT AERS Inhale 1 puff into the lungs daily.    [provider]     Objective    Physical Exam: Vitals:   10/14/24 2145 10/14/24 2200 10/14/24 2215 10/14/24 2239  BP: (!) 162/73 (!) 168/89 (!) 162/84   Pulse: 71 69 70 81  Resp:      Temp:      SpO2: 95% 96% 98% 95%  Weight:      Height:        General: appears to be stated age; alert, oriented Skin: warm, dry, no rash Head:  AT/[place] Mouth:  Oral mucosa membranes appear moist, normal dentition Neck: supple; trachea midline Heart:  RRR; did not appreciate any M/R/G Lungs: CTAB, did not appreciate any wheezes, rales, or rhonchi Abdomen: + BS; soft, ND, NT Extremities: no peripheral edema, no muscle wasting   ***    *** Neuro: strength and sensation intact in upper and lower extremities b/l Neuro: 5/5 strength of the proximal and distal flexors and extensors of the upper and lower extremities bilaterally; sensation intact in upper and lower extremities b/l; cranial nerves II through XII grossly intact; no pronator drift; no evidence suggestive of slurred speech, dysarthria, or facial droop; Normal muscle tone. No tremors. Neuro: In the setting of the patient's current mental status and associated inability to follow instructions, unable to perform full neurologic exam at this time.  As such, assessment of strength, sensation, and cranial nerves is limited at this time. Patient noted to spontaneously move all 4 extremities. No tremors.  Vascular: 2+ pedal pulses b/l; 2+ radial pulses b/l ***      Labs on Admission: I have personally reviewed following labs and imaging studies  CBC: Recent Labs  Lab 10/14/24 1843  WBC 13.3*  NEUTROABS 11.6*  HGB 11.5*  HCT 32.7*  MCV 92.6  PLT 281   Basic Metabolic Panel: Recent Labs  Lab 10/14/24 1539 10/14/24 1843  NA 119* 118*  K 4.6 4.9   CL 85* 85*  CO2 24 22  GLUCOSE 119* 156*  BUN 16 16  CREATININE 0.87 0.84  CALCIUM  8.9 8.8*   GFR: Estimated Creatinine Clearance: 67.8 mL/min (by C-G formula based on SCr of 0.84 mg/dL). Liver Function Tests: Recent Labs  Lab 10/14/24 1539 10/14/24 1843  AST 17 21  ALT 15 14  ALKPHOS 83 97  BILITOT 0.6 0.7  PROT 7.5 7.0  ALBUMIN 3.4* 3.2*   No results for input(s): LIPASE, AMYLASE in the last 168 hours. No results for input(s): AMMONIA in the last 168 hours. Coagulation Profile: No results for input(s): INR, PROTIME in the last 168 hours. Cardiac Enzymes: No results for input(s): CKTOTAL, CKMB, CKMBINDEX, TROPONINI in the last 168 hours. BNP (last 3 results) No results for input(s): PROBNP in the last 8760 hours. HbA1C: Recent Labs    10/14/24 1643  HGBA1C 5.8*   CBG: No results for input(s): GLUCAP in the last 168 hours. Lipid Profile: No results for input(s): CHOL, HDL, LDLCALC, TRIG, CHOLHDL, LDLDIRECT in the last 72 hours. Thyroid  Function Tests: Recent Labs    10/14/24 1539  TSH 2.27   Anemia Panel: Recent Labs    10/14/24 1539  VITAMINB12 >1500*  FOLATE 12.7  FERRITIN 164.5  TIBC 278.6  IRON 13*   Urine analysis:    Component Value Date/Time   COLORURINE YELLOW 10/14/2024 1843   APPEARANCEUR CLEAR 10/14/2024 1843   LABSPEC 1.015 10/14/2024 1843   PHURINE 6.0 10/14/2024 1843   GLUCOSEU 50 (A) 10/14/2024 1843   GLUCOSEU NEGATIVE 08/21/2019 1003   HGBUR NEGATIVE 10/14/2024 1843   BILIRUBINUR NEGATIVE 10/14/2024 1843   KETONESUR 20 (A) 10/14/2024 1843   PROTEINUR 30 (A) 10/14/2024 1843   UROBILINOGEN 0.2 08/21/2019 1003   NITRITE NEGATIVE 10/14/2024 1843   LEUKOCYTESUR NEGATIVE 10/14/2024 1843    Radiological Exams on Admission: CT Angio Chest PE W and/or Wo Contrast Result Date: 10/14/2024 EXAM: CTA of the Chest with contrast for PE 10/14/2024 09:05:53 PM TECHNIQUE: CTA of the chest was performed  without and with the administration of 75 mL Omnipaque  350 mg/mL intravenous contrast. Multiplanar reformatted images are provided for review. MIP images are provided for review. Automated exposure control, iterative reconstruction, and/or weight based adjustment of the mA/kV was utilized to reduce the radiation dose to as low as reasonably achievable. COMPARISON: Prior plain film examination. CLINICAL HISTORY: Recent fall with chest pain. FINDINGS: PULMONARY ARTERIES: Shows a normal branching pattern bilaterally. No filling defects identified to suggest pulmonary embolism. Main pulmonary artery is normal in caliber. MEDIASTINUM: The heart and pericardium demonstrate no acute abnormality. Atherosclerotic calcification of the aorta is noted. LYMPH NODES: A prominent right hilar node is noted measuring up to 15 mm. This is likely reactive in nature. No mediastinal or axillary lymphadenopathy. LUNGS AND PLEURA: The lungs demonstrate emphysematous changes bilaterally. Small right pleural effusion is noted. Mild right upper lobe and lower lobe parenchymal infiltrate is seen. This is similar to that seen on prior plain film examination. A 7 mm right lower lobe nodule is noted best seen on image number 102 of series 6. No pneumothorax. UPPER ABDOMEN: The visualized upper abdomen is within normal limits. SOFT TISSUES AND BONES: Mildly displaced left fourth rib fracture is noted posteriorly. No acute soft tissue abnormality. IMPRESSION: 1. No evidence of pulmonary embolism. 2. Mildly displaced  left posterior fourth rib fracture. 3. Mild right upper and lower lobe parenchymal infiltrate with small right pleural effusion, similar to prior plain film examination. 4. Solid 7 mm right lower lobe pulmonary nodule, recommend non-contrast chest CT at 6-12 months and repeat non-contrast chest CT at 18-24 months as per Fleischner Society Guidelines. 5. Emphysematous changes bilaterally, pulmonary emphysema is an independent risk  factor for lung cancer and recommend consideration for evaluation for a low-dose CT lung cancer screening program. Electronically signed by: Oneil Devonshire MD 10/14/2024 09:16 PM EST RP Workstation: [id number]   DG Chest 2 View Result Date: 10/14/2024 EXAM: 2 VIEW(S) XRAY OF THE CHEST 10/14/2024 03:45:21 PM COMPARISON: 10/06/2024 CLINICAL HISTORY: Fall. FINDINGS: LUNGS AND PLEURA: Pulmonary interstitial prominence with mild pulmonary edema. Patchy right alveolar process could represent asymmetric edema or pneumonia. Small right pleural effusion. No pneumothorax. HEART AND MEDIASTINUM: Cardiomegaly. Post TAVR. Left bipolar pacer. Calcified aorta. BONES AND SOFT TISSUES: No acute osseous abnormality. IMPRESSION: 1. Mild pulmonary edema. 2. Patchy right alveolar process, possibly representing asymmetric edema or pneumonia. 3. Small right pleural effusion. Electronically signed by: Elsie Gravely MD 10/14/2024 04:23 PM EST RP Workstation: [id number]   DG Ribs Unilateral Left Result Date: 10/14/2024 EXAM: 1 VIEW XRAY OF THE LEFT RIBS 10/14/2024 03:45:21 PM COMPARISON: None available. CLINICAL HISTORY: Fall. FINDINGS: BONES: No acute displaced rib fracture. LUNGS AND PLEURA: Visualized lungs demonstrate no acute abnormality. LINES AND TUBES: Left subclavian dual lead pacemaker in place. HEART AND MEDIASTINUM: Status post TAVR. Aortic calcification. IMPRESSION: 1. No acute displaced left rib fracture. Electronically signed by: Elsie Gravely MD 10/14/2024 04:22 PM EST RP Workstation: [id number]   DG Scapula Left Result Date: 10/14/2024 EXAM: 1 VIEW XRAY OF THE LEFT SCAPULA 10/14/2024 03:45:21 PM COMPARISON: None available. CLINICAL HISTORY: Fall. FINDINGS: BONES AND JOINTS: No acute fracture. No malalignment. No dislocation. No focal bone lesion or bone destruction is identified. The bone cortex appears intact. Mild degenerative changes are present in the glenohumeral joint. SOFT TISSUES: The generator for the  cardiac pacemaker overlies the scapula. The soft tissues are unremarkable. IMPRESSION: 1. No evidence of acute fracture or dislocation. 2. Mild degenerative changes in the glenohumeral joint. Electronically signed by: Elsie Gravely MD 10/14/2024 04:21 PM EST RP Workstation: [id number]      Assessment/Plan   Principal Problem:   Acute hyponatremia   ***            ***                     ***                      ***                     ***                     ***                     ***                      ***                     ***                     ***                     ***                     ***                    ***                   ***  DVT prophylaxis: SCD's ***  Code Status: Full code*** Family Communication: none*** Disposition Plan: Per Rounding Team Consults called: none***;  Admission status: ***     I SPENT GREATER THAN 75 *** MINUTES IN CLINICAL CARE TIME/MEDICAL DECISION-MAKING IN COMPLETING THIS ADMISSION.      Eva NOVAK Myrtie Leuthold DO [facility]  From 7PM - 7AM   10/14/2024, 11:02 PM   ***     [1]  Allergies Allergen Reactions   Demeclocycline Nausea Only    Nausea and constipation   Pravastatin  Other (See Comments)    myopathy   Lisinopril  Cough   Oxycodone      Mental confusion   "

## 2024-10-14 NOTE — ED Notes (Signed)
 Pt back from CT

## 2024-10-14 NOTE — ED Notes (Signed)
 CCMD called and pt put on monitor.

## 2024-10-14 NOTE — ED Provider Triage Note (Signed)
 Emergency Medicine Provider Triage Evaluation Note  Brian Hunt , a 79 y.o. male  was evaluated in triage.  Pt complains of low sodium. Report having a fall several weeks ago, was told he had a cracked ribs.  Went to see [facility] today, had CXR and was told he has pneumonia. Now pt were notified his sodium is 119 from today's lab.  Did admit in decrease appetite, having some cold sxs and mild sob.  No n/v/d  Review of Systems  Positive: As above Negative: As above  Physical Exam  BP (!) 140/72 (BP Location: Right Arm)   Pulse 75   Temp (!) 97.5 F (36.4 C)   Resp (!) 23   Ht 5' 7 (1.702 m)   Wt 78.4 kg   SpO2 91%   BMI 27.07 kg/m  Gen:   Awake, no distress   Resp:  Normal effort  MSK:   Moves extremities without difficulty  Other:    Medical Decision Making  Medically screening exam initiated at 7:05 PM.  Appropriate orders placed.  RONNELL CLINGER was informed that the remainder of the evaluation will be completed by another provider, this initial triage assessment does not replace that evaluation, and the importance of remaining in the ED until their evaluation is complete.     Nivia Colon, PA-C 10/14/24 1907

## 2024-10-14 NOTE — Telephone Encounter (Signed)
 Please ask him to go to the ED

## 2024-10-14 NOTE — ED Triage Notes (Addendum)
 Patient was sent by PCP that told him his sodium levels were low and he was at risk for having a seizure.   Sodium levels were drawn this afternoon. Sodium 119.

## 2024-10-15 ENCOUNTER — Encounter (HOSPITAL_COMMUNITY): Payer: Self-pay | Admitting: Internal Medicine

## 2024-10-15 DIAGNOSIS — D638 Anemia in other chronic diseases classified elsewhere: Secondary | ICD-10-CM | POA: Diagnosis present

## 2024-10-15 DIAGNOSIS — D72829 Elevated white blood cell count, unspecified: Secondary | ICD-10-CM | POA: Diagnosis present

## 2024-10-15 DIAGNOSIS — Z8679 Personal history of other diseases of the circulatory system: Secondary | ICD-10-CM

## 2024-10-15 DIAGNOSIS — Z8709 Personal history of other diseases of the respiratory system: Secondary | ICD-10-CM

## 2024-10-15 DIAGNOSIS — R531 Weakness: Secondary | ICD-10-CM

## 2024-10-15 DIAGNOSIS — A419 Sepsis, unspecified organism: Secondary | ICD-10-CM | POA: Diagnosis present

## 2024-10-15 DIAGNOSIS — I480 Paroxysmal atrial fibrillation: Secondary | ICD-10-CM | POA: Diagnosis present

## 2024-10-15 DIAGNOSIS — E871 Hypo-osmolality and hyponatremia: Secondary | ICD-10-CM | POA: Diagnosis not present

## 2024-10-15 DIAGNOSIS — S2232XA Fracture of one rib, left side, initial encounter for closed fracture: Secondary | ICD-10-CM | POA: Diagnosis present

## 2024-10-15 LAB — BASIC METABOLIC PANEL WITH GFR
Anion gap: 10 (ref 5–15)
Anion gap: 13 (ref 5–15)
Anion gap: 10 (ref 5–15)
BUN: 15 mg/dL (ref 8–23)
BUN: 16 mg/dL (ref 8–23)
BUN: 18 mg/dL (ref 8–23)
CO2: 24 mmol/L (ref 22–32)
CO2: 23 mmol/L (ref 22–32)
CO2: 22 mmol/L (ref 22–32)
Calcium: 8.7 mg/dL — ABNORMAL LOW (ref 8.9–10.3)
Calcium: 8.5 mg/dL — ABNORMAL LOW (ref 8.9–10.3)
Calcium: 8.6 mg/dL — ABNORMAL LOW (ref 8.9–10.3)
Chloride: 88 mmol/L — ABNORMAL LOW (ref 98–111)
Chloride: 87 mmol/L — ABNORMAL LOW (ref 98–111)
Chloride: 89 mmol/L — ABNORMAL LOW (ref 98–111)
Creatinine, Ser: 0.99 mg/dL (ref 0.61–1.24)
Creatinine, Ser: 0.88 mg/dL (ref 0.61–1.24)
Creatinine, Ser: 0.91 mg/dL (ref 0.61–1.24)
GFR, Estimated: >60 mL/min
GFR, Estimated: >60 mL/min
GFR, Estimated: >60 mL/min
Glucose, Bld: 170 mg/dL — ABNORMAL HIGH (ref 70–99)
Glucose, Bld: 107 mg/dL — ABNORMAL HIGH (ref 70–99)
Glucose, Bld: 130 mg/dL — ABNORMAL HIGH (ref 70–99)
Potassium: 4 mmol/L (ref 3.5–5.1)
Potassium: 4.3 mmol/L (ref 3.5–5.1)
Potassium: 4.4 mmol/L (ref 3.5–5.1)
Sodium: 122 mmol/L — ABNORMAL LOW (ref 135–145)
Sodium: 123 mmol/L — ABNORMAL LOW (ref 135–145)
Sodium: 121 mmol/L — ABNORMAL LOW (ref 135–145)

## 2024-10-15 LAB — OSMOLALITY, URINE: Osmolality, Ur: 296 mosm/kg — ABNORMAL LOW (ref 300–900)

## 2024-10-15 LAB — COMPREHENSIVE METABOLIC PANEL WITH GFR
ALT: 15 U/L (ref 0–44)
AST: 26 U/L (ref 15–41)
Albumin: 3 g/dL — ABNORMAL LOW (ref 3.5–5.0)
Alkaline Phosphatase: 107 U/L (ref 38–126)
Anion gap: 12 (ref 5–15)
BUN: 14 mg/dL (ref 8–23)
CO2: 21 mmol/L — ABNORMAL LOW (ref 22–32)
Calcium: 8.7 mg/dL — ABNORMAL LOW (ref 8.9–10.3)
Chloride: 89 mmol/L — ABNORMAL LOW (ref 98–111)
Creatinine, Ser: 0.83 mg/dL (ref 0.61–1.24)
GFR, Estimated: >60 mL/min
Glucose, Bld: 143 mg/dL — ABNORMAL HIGH (ref 70–99)
Potassium: 4.6 mmol/L (ref 3.5–5.1)
Sodium: 122 mmol/L — ABNORMAL LOW (ref 135–145)
Total Bilirubin: 0.4 mg/dL (ref 0.0–1.2)
Total Protein: 6.8 g/dL (ref 6.5–8.1)

## 2024-10-15 LAB — CBC WITH DIFFERENTIAL/PLATELET
Abs Immature Granulocytes: 0.31 10*3/uL — ABNORMAL HIGH (ref 0.00–0.07)
Basophils Absolute: 0 10*3/uL (ref 0.0–0.1)
Basophils Relative: 0 %
Eosinophils Absolute: 0 10*3/uL (ref 0.0–0.5)
Eosinophils Relative: 0 %
HCT: 34.6 % — ABNORMAL LOW (ref 39.0–52.0)
Hemoglobin: 11.9 g/dL — ABNORMAL LOW (ref 13.0–17.0)
Immature Granulocytes: 2 %
Lymphocytes Relative: 6 %
Lymphs Abs: 0.8 10*3/uL (ref 0.7–4.0)
MCH: 32.4 pg (ref 26.0–34.0)
MCHC: 34.4 g/dL (ref 30.0–36.0)
MCV: 94.3 fL (ref 80.0–100.0)
Monocytes Absolute: 1.8 10*3/uL — ABNORMAL HIGH (ref 0.1–1.0)
Monocytes Relative: 13 %
Neutro Abs: 11.3 10*3/uL — ABNORMAL HIGH (ref 1.7–7.7)
Neutrophils Relative %: 79 %
Platelets: 266 10*3/uL (ref 150–400)
RBC: 3.67 MIL/uL — ABNORMAL LOW (ref 4.22–5.81)
RDW: 13.2 % (ref 11.5–15.5)
WBC: 14.2 10*3/uL — ABNORMAL HIGH (ref 4.0–10.5)
nRBC: 0 % (ref 0.0–0.2)

## 2024-10-15 LAB — CREATININE, URINE, RANDOM: Creatinine, Urine: 42 mg/dL

## 2024-10-15 LAB — MAGNESIUM
Magnesium: 1.8 mg/dL (ref 1.7–2.4)
Magnesium: 2 mg/dL (ref 1.7–2.4)

## 2024-10-15 LAB — T4, FREE: Free T4: 1.57 ng/dL (ref 0.80–2.00)

## 2024-10-15 LAB — PROCALCITONIN: Procalcitonin: <0.1 ng/mL

## 2024-10-15 LAB — PRO BRAIN NATRIURETIC PEPTIDE: Pro Brain Natriuretic Peptide: 1433 pg/mL — ABNORMAL HIGH

## 2024-10-15 LAB — PHOSPHORUS: Phosphorus: 3.7 mg/dL (ref 2.5–4.6)

## 2024-10-15 LAB — URIC ACID: Uric Acid, Serum: 3.1 mg/dL — ABNORMAL LOW (ref 3.7–8.6)

## 2024-10-15 LAB — OSMOLALITY: Osmolality: 264 mosm/kg — ABNORMAL LOW (ref 275–295)

## 2024-10-15 LAB — TSH: TSH: 0.934 u[IU]/mL (ref 0.350–4.500)

## 2024-10-15 MED ORDER — ROSUVASTATIN CALCIUM 5 MG PO TABS
5.0000 mg | ORAL_TABLET | Freq: Every day | ORAL | Status: DC
  Administered 2024-10-15 – 2024-10-16 (×3): 5 mg via ORAL
  Filled 2024-10-15 (×3): qty 1

## 2024-10-15 MED ORDER — RIVAROXABAN 2.5 MG PO TABS
2.5000 mg | ORAL_TABLET | Freq: Two times a day (BID) | ORAL | Status: DC
  Administered 2024-10-15 – 2024-10-17 (×5): 2.5 mg via ORAL
  Filled 2024-10-15 (×5): qty 1

## 2024-10-15 MED ORDER — SODIUM CHLORIDE 1 G PO TABS
1.0000 g | ORAL_TABLET | Freq: Two times a day (BID) | ORAL | Status: DC
  Administered 2024-10-15 – 2024-10-17 (×5): 1 g via ORAL
  Filled 2024-10-15 (×5): qty 1

## 2024-10-15 MED ORDER — ASPIRIN 81 MG PO TBEC
81.0000 mg | DELAYED_RELEASE_TABLET | Freq: Every day | ORAL | Status: DC
  Administered 2024-10-15 – 2024-10-17 (×3): 81 mg via ORAL
  Filled 2024-10-15 (×3): qty 1

## 2024-10-15 MED ORDER — FINASTERIDE 5 MG PO TABS
5.0000 mg | ORAL_TABLET | Freq: Every day | ORAL | Status: DC
  Administered 2024-10-15 – 2024-10-16 (×3): 5 mg via ORAL
  Filled 2024-10-15 (×3): qty 1

## 2024-10-15 MED ORDER — BENZONATATE 100 MG PO CAPS
200.0000 mg | ORAL_CAPSULE | Freq: Three times a day (TID) | ORAL | Status: DC | PRN
  Administered 2024-10-15 – 2024-10-16 (×4): 200 mg via ORAL
  Filled 2024-10-15 (×4): qty 2

## 2024-10-15 MED ORDER — IRBESARTAN 150 MG PO TABS
150.0000 mg | ORAL_TABLET | Freq: Every day | ORAL | Status: DC
  Administered 2024-10-15 – 2024-10-17 (×3): 150 mg via ORAL
  Filled 2024-10-15 (×3): qty 1

## 2024-10-15 MED ORDER — METOPROLOL SUCCINATE ER 25 MG PO TB24
25.0000 mg | ORAL_TABLET | Freq: Every day | ORAL | Status: DC
  Administered 2024-10-15 – 2024-10-16 (×3): 25 mg via ORAL
  Filled 2024-10-15 (×3): qty 1

## 2024-10-15 MED ORDER — PHENOL 1.4 % MT LIQD: 1.0000 | OROMUCOSAL | Status: DC | PRN

## 2024-10-15 MED ORDER — TAMSULOSIN HCL 0.4 MG PO CAPS
0.4000 mg | ORAL_CAPSULE | Freq: Every day | ORAL | Status: DC
  Administered 2024-10-15 – 2024-10-16 (×3): 0.4 mg via ORAL
  Filled 2024-10-15 (×3): qty 1

## 2024-10-15 MED ORDER — ALBUTEROL SULFATE (2.5 MG/3ML) 0.083% IN NEBU
2.5000 mg | INHALATION_SOLUTION | RESPIRATORY_TRACT | Status: DC | PRN
  Administered 2024-10-16: 2.5 mg via RESPIRATORY_TRACT
  Filled 2024-10-15: qty 3

## 2024-10-15 MED ORDER — CILOSTAZOL 50 MG PO TABS
50.0000 mg | ORAL_TABLET | Freq: Two times a day (BID) | ORAL | Status: DC
  Administered 2024-10-15 – 2024-10-17 (×6): 50 mg via ORAL
  Filled 2024-10-15 (×6): qty 1

## 2024-10-15 MED ORDER — GUAIFENESIN ER 600 MG PO TB12
600.0000 mg | ORAL_TABLET | Freq: Two times a day (BID) | ORAL | Status: DC
  Administered 2024-10-15 – 2024-10-17 (×5): 600 mg via ORAL
  Filled 2024-10-15 (×5): qty 1

## 2024-10-15 NOTE — Progress Notes (Signed)
 " PROGRESS NOTE    Brian Hunt  FMW:[id number] DOB: [birth year] DOA: 10/14/2024 PCP: Joshua Debby CROME, MD    Brief Narrative:  79 year old with chronic hyponatremia with previous range of sodium 124-130, BPH, hypertension, paroxysmal A-fib sick sinus syndrome status post pacemaker chronically anticoagulated on Xarelto , aortic stenosis status post TAVR, COPD who had fallen on the floor about a week ago and had some left sided chest pain and discomfort, seen in the emergency room and found to have a left fifth rib fracture, was seen at follow-up with primary care physician's office, found to have more congestion and cough and sent to ER.  In the emergency room afebrile.  Heart rate controlled.  Blood pressure stable.  On room air.  Sodium 118 with chloride of 85.  Recent sodium was 127.  WC count 13.3.  CTA of the chest showed no evidence of PE, mildly displaced left posterior fourth rib fracture without evidence of pneumothorax, right lower lobe and right upper lobe infiltrates, small right-sided pleural effusion.  Patient was started on IV antibiotics, normal saline and admitted to the hospital.  Subjective: Patient seen and examined.  Nursing reported that his saturation was 87% and needed 2 L of oxygen .  Does not use oxygen  at home.  Patient himself tells me that he has some feeling of congestion but denies any complaints.  Denies any nausea vomiting, appetite is fair.  He does drink 3 or 4 bottles of water  a day but does not think he is compulsively drinking water . Sodium 122.   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 Urine osmole and serum osmole low.  Does have chronic hyponatremia but presents with sodium of 118 which is lower than his usual.  Currently without any acute neurological deficits. Received 1 L of isotonic fluid-sodium appropriately improving.  Avoid sudden correction. Will start patient on sodium chloride  1 g twice daily.  Recommend moderate salt in  his diet.  Recommended replenish water  deficit with electrolyte waters.  Recheck levels today evening and tomorrow morning.  Suspected right-sided pneumonia: Likely pneumonia or atelectasis secondary to rib fracture.  Pain is controlled now. Continue Rocephin  and azithromycin . Start incentive spirometry, mobility with PT OT. Mucolytic's and bronchodilators.  Deep breathing exercises. Procalcitonin is normal.  Will continue IV antibiotics today but anticipate de-escalating by tomorrow once his mobility improves.  Single rib fracture: Traumatic.  Stable.  Incentive.  Essential hypertension: Blood pressure is stable, continue losartan and metoprolol .  Hyperlipidemia: On a statin continue.  BPH: Stable now.  On Flomax .  Paroxysmal A-fib status post pacemaker: Paced rhythm.  Therapeutic on Xarelto .  On metoprolol .  COPD: Currently stable.  Bronchodilator therapy.  Chest physiotherapy.    DVT prophylaxis: SCDs Start: 10/14/24 2248 rivaroxaban  (XARELTO ) tablet 2.5 mg   Code Status: Full code Family Communication: None at the bedside Disposition Plan: Status is: Inpatient Remains inpatient appropriate because: Electrolyte monitoring, IV antibiotics     Consultants:  None  Procedures:  None  Antimicrobials:  Rocephin  azithromycin  1/27---     Objective: Vitals:   10/15/24 0400 10/15/24 0409 10/15/24 0453 10/15/24 0822  BP: (!) 109/58  (!) 185/81 (!) 146/67  Pulse: 70  80 78  Resp: 14  18 20   Temp:  98.1 F (36.7 C) (!) 97.5 F (36.4 C) 97.6 F (36.4 C)  TempSrc:  Axillary Oral Oral  SpO2: 92%  93% 93%  Weight:  (P) 77.3 kg    Height:  (P) 5' 7 (1.702  m)      Intake/Output Summary (Last 24 hours) at 10/15/2024 0900 Last data filed at 10/15/2024 0802 Gross per 24 hour  Intake 480 ml  Output 225 ml  Net 255 ml   Filed Weights   10/14/24 1858 10/15/24 0409  Weight: 78.4 kg (P) 77.3 kg    Examination:  General exam: Appears calm and comfortable.  Pleasant  interaction. Respiratory system: Clear to auscultation. Respiratory effort normal.  Some conducted upper airway sounds.  No added sounds.  On 2 L oxygen .  Not using any accessory muscles. Cardiovascular system: S1 & S2 heard, irregularly irregular.  Pacemaker present left precordium.  No palpable tenderness.. No pedal edema. Gastrointestinal system: Abdomen is nondistended, soft and nontender. No organomegaly or masses felt. Normal bowel sounds heard. Central nervous system: Alert and oriented. No focal neurological deficits. Extremities: Symmetric 5 x 5 power. Skin: No rashes, lesions or ulcers Psychiatry: Judgement and insight appear normal. Mood & affect appropriate.     Data Reviewed: I have personally reviewed following labs and imaging studies  CBC: Recent Labs  Lab 10/14/24 1843 10/15/24 0200  WBC 13.3* 14.2*  NEUTROABS 11.6* 11.3*  HGB 11.5* 11.9*  HCT 32.7* 34.6*  MCV 92.6 94.3  PLT 281 266   Basic Metabolic Panel: Recent Labs  Lab 10/14/24 1539 10/14/24 1843 10/14/24 2330 10/15/24 0250  NA 119* 118*  --  122*  K 4.6 4.9  --  4.6  CL 85* 85*  --  89*  CO2 24 22  --  21*  GLUCOSE 119* 156*  --  143*  BUN 16 16  --  14  CREATININE 0.87 0.84  --  0.83  CALCIUM  8.9 8.8*  --  8.7*  MG  --   --  1.8 2.0  PHOS  --   --   --  3.7   GFR: Estimated Creatinine Clearance: 68.6 mL/min (by C-G formula based on SCr of 0.83 mg/dL). Liver Function Tests: Recent Labs  Lab 10/14/24 1539 10/14/24 1843 10/15/24 0250  AST 17 21 26   ALT 15 14 15   ALKPHOS 83 97 107  BILITOT 0.6 0.7 0.4  PROT 7.5 7.0 6.8  ALBUMIN 3.4* 3.2* 3.0*   No results for input(s): LIPASE, AMYLASE in the last 168 hours. No results for input(s): AMMONIA in the last 168 hours. Coagulation Profile: No results for input(s): INR, PROTIME in the last 168 hours. Cardiac Enzymes: No results for input(s): CKTOTAL, CKMB, CKMBINDEX, TROPONINI in the last 168 hours. BNP (last 3  results) Recent Labs    10/15/24 0250  PROBNP 1,433.0*   HbA1C: Recent Labs    10/14/24 1643  HGBA1C 5.8*   CBG: No results for input(s): GLUCAP in the last 168 hours. Lipid Profile: No results for input(s): CHOL, HDL, LDLCALC, TRIG, CHOLHDL, LDLDIRECT in the last 72 hours. Thyroid  Function Tests: Recent Labs    10/14/24 2330 10/14/24 2332  TSH  --  0.934  FREET4 1.57  --    Anemia Panel: Recent Labs    10/14/24 1539  VITAMINB12 >1500*  FOLATE 12.7  FERRITIN 164.5  TIBC 278.6  IRON 13*   Sepsis Labs: Recent Labs  Lab 10/14/24 2327 10/14/24 2330  PROCALCITON  --  <0.10  LATICACIDVEN 1.0  --     Recent Results (from the past 240 hours)  Culture, blood (Routine X 2) w Reflex to ID Panel     Status: None (Preliminary result)   Collection Time: 10/14/24 10:47 PM   Specimen:  BLOOD RIGHT HAND  Result Value Ref Range Status   Specimen Description BLOOD RIGHT HAND  Final   Special Requests   Final    BOTTLES DRAWN AEROBIC ONLY Blood Culture adequate volume   Culture   Final    NO GROWTH < 12 HOURS Performed at [facility], [address], [place], [place] [address]    Report Status PENDING  Incomplete  Culture, blood (Routine X 2) w Reflex to ID Panel     Status: None (Preliminary result)   Collection Time: 10/14/24 11:10 PM   Specimen: BLOOD LEFT ARM  Result Value Ref Range Status   Specimen Description BLOOD LEFT ARM  Final   Special Requests   Final    BOTTLES DRAWN AEROBIC AND ANAEROBIC Blood Culture adequate volume   Culture   Final    NO GROWTH < 12 HOURS Performed at [facility], [address] [place], [place] [address]    Report Status PENDING  Incomplete         Radiology Studies: CT Angio Chest PE W and/or Wo Contrast Result Date: 10/14/2024 EXAM: CTA of the Chest with contrast for PE 10/14/2024 09:05:53 PM TECHNIQUE: CTA of the chest was performed without and with the administration of 75 mL Omnipaque  350  mg/mL intravenous contrast. Multiplanar reformatted images are provided for review. MIP images are provided for review. Automated exposure control, iterative reconstruction, and/or weight based adjustment of the mA/kV was utilized to reduce the radiation dose to as low as reasonably achievable. COMPARISON: Prior plain film examination. CLINICAL HISTORY: Recent fall with chest pain. FINDINGS: PULMONARY ARTERIES: Shows a normal branching pattern bilaterally. No filling defects identified to suggest pulmonary embolism. Main pulmonary artery is normal in caliber. MEDIASTINUM: The heart and pericardium demonstrate no acute abnormality. Atherosclerotic calcification of the aorta is noted. LYMPH NODES: A prominent right hilar node is noted measuring up to 15 mm. This is likely reactive in nature. No mediastinal or axillary lymphadenopathy. LUNGS AND PLEURA: The lungs demonstrate emphysematous changes bilaterally. Small right pleural effusion is noted. Mild right upper lobe and lower lobe parenchymal infiltrate is seen. This is similar to that seen on prior plain film examination. A 7 mm right lower lobe nodule is noted best seen on image number 102 of series 6. No pneumothorax. UPPER ABDOMEN: The visualized upper abdomen is within normal limits. SOFT TISSUES AND BONES: Mildly displaced left fourth rib fracture is noted posteriorly. No acute soft tissue abnormality. IMPRESSION: 1. No evidence of pulmonary embolism. 2. Mildly displaced left posterior fourth rib fracture. 3. Mild right upper and lower lobe parenchymal infiltrate with small right pleural effusion, similar to prior plain film examination. 4. Solid 7 mm right lower lobe pulmonary nodule, recommend non-contrast chest CT at 6-12 months and repeat non-contrast chest CT at 18-24 months as per Fleischner Society Guidelines. 5. Emphysematous changes bilaterally, pulmonary emphysema is an independent risk factor for lung cancer and recommend consideration for  evaluation for a low-dose CT lung cancer screening program. Electronically signed by: Oneil Devonshire MD 10/14/2024 09:16 PM EST RP Workstation: [id number]   DG Chest 2 View Result Date: 10/14/2024 EXAM: 2 VIEW(S) XRAY OF THE CHEST 10/14/2024 03:45:21 PM COMPARISON: 10/06/2024 CLINICAL HISTORY: Fall. FINDINGS: LUNGS AND PLEURA: Pulmonary interstitial prominence with mild pulmonary edema. Patchy right alveolar process could represent asymmetric edema or pneumonia. Small right pleural effusion. No pneumothorax. HEART AND MEDIASTINUM: Cardiomegaly. Post TAVR. Left bipolar pacer. Calcified aorta. BONES AND SOFT TISSUES: No acute osseous  abnormality. IMPRESSION: 1. Mild pulmonary edema. 2. Patchy right alveolar process, possibly representing asymmetric edema or pneumonia. 3. Small right pleural effusion. Electronically signed by: Elsie Gravely MD 10/14/2024 04:23 PM EST RP Workstation: [id number]   DG Ribs Unilateral Left Result Date: 10/14/2024 EXAM: 1 VIEW XRAY OF THE LEFT RIBS 10/14/2024 03:45:21 PM COMPARISON: None available. CLINICAL HISTORY: Fall. FINDINGS: BONES: No acute displaced rib fracture. LUNGS AND PLEURA: Visualized lungs demonstrate no acute abnormality. LINES AND TUBES: Left subclavian dual lead pacemaker in place. HEART AND MEDIASTINUM: Status post TAVR. Aortic calcification. IMPRESSION: 1. No acute displaced left rib fracture. Electronically signed by: Elsie Gravely MD 10/14/2024 04:22 PM EST RP Workstation: [id number]   DG Scapula Left Result Date: 10/14/2024 EXAM: 1 VIEW XRAY OF THE LEFT SCAPULA 10/14/2024 03:45:21 PM COMPARISON: None available. CLINICAL HISTORY: Fall. FINDINGS: BONES AND JOINTS: No acute fracture. No malalignment. No dislocation. No focal bone lesion or bone destruction is identified. The bone cortex appears intact. Mild degenerative changes are present in the glenohumeral joint. SOFT TISSUES: The generator for the cardiac pacemaker overlies the scapula. The soft tissues  are unremarkable. IMPRESSION: 1. No evidence of acute fracture or dislocation. 2. Mild degenerative changes in the glenohumeral joint. Electronically signed by: Elsie Gravely MD 10/14/2024 04:21 PM EST RP Workstation: [id number]        Scheduled Meds:  aspirin  EC  81 mg Oral Daily   azithromycin   500 mg Oral Daily   cilostazol   50 mg Oral BID   finasteride   5 mg Oral QHS   guaiFENesin   600 mg Oral BID   irbesartan   150 mg Oral Daily   metoprolol  succinate  25 mg Oral QHS   rivaroxaban   2.5 mg Oral BID WC   rosuvastatin   5 mg Oral QHS   sodium chloride   1 g Oral BID WC   tamsulosin   0.4 mg Oral QHS   Continuous Infusions:  cefTRIAXone  (ROCEPHIN )  IV Stopped (10/15/24 0008)     LOS: 1 day       Maliea Grandmaison, MD [facility]   "

## 2024-10-15 NOTE — Evaluation (Signed)
 Physical Therapy Evaluation Patient Details Name: Brian Hunt MRN: [id number] DOB: 1946/07/20 Today's Date: 10/15/2024  History of Present Illness  79 y.o. male presents to [facility] 10/14/24 with SOB, recent fall 1 week prior. CTA chest showed mildly displaced L posterior 4th rib fx, PNA, and small R sided pleural effusion. Also with acute on chronic hypo-osmolar hyponatremia and sepsis. PMHx: chronic hyponatremia with baseline serum sodium range 124-130, send hypertension, BPH, paroxysmal atrial fibrillation, lead by sick sinus syndrome status post pacemaker placement, chronically anticoagulated on Xarelto , severe aortic stenosis status post TAVR in October 2024, COPD   Clinical Impression  PTA, pt was independent for mobility with no AD. Pt had a recent fall that occurred 2/2 missing last step when going down stairs. Pt presents with impaired cardiopulmonary system, decreased activity tolerance/fatigue, generalized weakness, and impaired balance. Pt was supervision for transfers and CGA to ambulate 240 ft with no AD. Pt was slightly unsteady and would drift right/left, however, no overt LOB observed. Able to negotiate 5 steps with one handrail and CGA. Required a few standing rest breaks due to feeling SOB, vitals below. Discussed pacing activities and taking frequent rest breaks with pt verbalizing understanding. Also discussed bracing L ribs when coughing to relieve pain. Pt has 24/7 assist available upon d/c home. Recommending OP PT to work on balance impairments. Acute PT to follow.     Pre-mobility- 94% SpO2 on RA During mobility- 92% SpO2 on RA After mobility- 91% SpO2 on RA  HR 110s      If plan is discharge home, recommend the following: Assist for transportation;Help with stairs or ramp for entrance   Can travel by private vehicle   Yes     Equipment Recommendations None recommended by PT     Functional Status Assessment Patient has had a recent decline in their functional status  and demonstrates the ability to make significant improvements in function in a reasonable and predictable amount of time.     Precautions / Restrictions Precautions Precautions: Fall Recall of Precautions/Restrictions: Intact Precaution/Restrictions Comments: watch O2 Restrictions Weight Bearing Restrictions Per Provider Order: No      Mobility  Bed Mobility  General bed mobility comments: NT received in bathroom    Transfers Overall transfer level: Needs assistance Equipment used: None Transfers: Sit to/from Stand Sit to Stand: Supervision    General transfer comment: supervision for safety with no AD    Ambulation/Gait Ambulation/Gait assistance: Contact guard assist Gait Distance (Feet): 240 Feet Assistive device: None Gait Pattern/deviations: Step-through pattern, Decreased stride length, Drifts right/left Gait velocity: decr    General Gait Details: slightly unsteady gait with pt drifting right/left. No overt LOB  Stairs Stairs: Yes Stairs assistance: Contact guard assist Stair Management: One rail Right, Alternating pattern, Forwards Number of Stairs: 5 General stair comments: steady with use of one handrail    Balance Overall balance assessment: Needs assistance, Mild deficits observed, not formally tested, History of Falls Sitting-balance support: No upper extremity supported, Feet supported Sitting balance-Leahy Scale: Normal    Standing balance support: No upper extremity supported Standing balance-Leahy Scale: Fair       Pertinent Vitals/Pain Pain Assessment Pain Assessment: Faces Faces Pain Scale: Hurts little more Pain Location: L rib Pain Descriptors / Indicators: Aching, Discomfort Pain Intervention(s): Limited activity within patient's tolerance, Monitored during session, Repositioned    Home Living Family/patient expects to be discharged to:: Private residence Living Arrangements: Spouse/significant other Available Help at Discharge:  Family;Available 24 hours/day Type of Home: House  Home Access: Stairs to enter Entrance Stairs-Rails: Right (wall on Left) Entrance Stairs-Number of Steps: 4   Home Layout: Two level;Full bath on main level;Able to live on main level with bedroom/bathroom Home Equipment: None      Prior Function Prior Level of Function : Independent/Modified Independent;History of Falls (last six months);Driving    Mobility Comments: Ind with no AD, one recent fall when going down steps with a cooler in his hands ADLs Comments: Ind     Extremity/Trunk Assessment   Upper Extremity Assessment Upper Extremity Assessment: Defer to OT evaluation    Lower Extremity Assessment Lower Extremity Assessment: Generalized weakness    Cervical / Trunk Assessment Cervical / Trunk Assessment: Other exceptions Cervical / Trunk Exceptions: L posterior 4th rib fx  Communication   Communication Communication: No apparent difficulties    Cognition Arousal: Alert Behavior During Therapy: WFL for tasks assessed/performed   PT - Cognitive impairments: No apparent impairments    Following commands: Intact       Cueing Cueing Techniques: Verbal cues     General Comments General comments (skin integrity, edema, etc.): Wife present and supportive. Discussed splinting for L rib pain with coughing and when deep breathing     PT Assessment Patient needs continued PT services  PT Problem List Decreased strength;Decreased activity tolerance;Decreased balance;Decreased mobility       PT Treatment Interventions DME instruction;Gait training;Stair training;Therapeutic activities;Functional mobility training;Therapeutic exercise;Balance training;Neuromuscular re-education;Patient/family education    PT Goals (Current goals can be found in the Care Plan section)  Acute Rehab PT Goals Patient Stated Goal: to work on improving balance PT Goal Formulation: With patient/family Time For Goal Achievement:  10/29/24 Potential to Achieve Goals: Good    Frequency Min 2X/week        AM-PAC PT 6 Clicks Mobility  Outcome Measure Help needed turning from your back to your side while in a flat bed without using bedrails?: None Help needed moving from lying on your back to sitting on the side of a flat bed without using bedrails?: None Help needed moving to and from a bed to a chair (including a wheelchair)?: A Little Help needed standing up from a chair using your arms (e.g., wheelchair or bedside chair)?: A Little Help needed to walk in hospital room?: A Little Help needed climbing 3-5 steps with a railing? : A Little 6 Click Score: 20    End of Session Equipment Utilized During Treatment: Gait belt Activity Tolerance: Patient tolerated treatment well Patient left: in bed;with call bell/phone within reach;with family/visitor present Nurse Communication: Mobility status;Other (comment) (SpO2) PT Visit Diagnosis: Unsteadiness on feet (R26.81);Other abnormalities of gait and mobility (R26.89);Muscle weakness (generalized) (M62.81);History of falling (Z91.81)    Time: 1044-1100 PT Time Calculation (min) (ACUTE ONLY): 16 min   Charges:   PT Evaluation $PT Eval Low Complexity: 1 Low   PT General Charges $$ ACUTE PT VISIT: 1 Visit       Kate ORN, PT, DPT Secure Chat Preferred  Rehab Office [phone]   Kate BRAVO Wendolyn 10/15/2024, 11:06 AM

## 2024-10-15 NOTE — Telephone Encounter (Signed)
 Patient currently present at ED

## 2024-10-15 NOTE — TOC CM/SW Note (Signed)
 Transition of Care [facility]) - Inpatient Brief Assessment   Patient Details  Name: Brian Hunt MRN: [id number] Date of Birth: 1946-02-27  Transition of [facility]) CM/SW Contact:    Sudie Erminio Deems, RN Phone Number: 10/15/2024, 3:41 PM   Clinical Narrative: Patient presented for shortness of breath. PTA patient was independent from home with spouse. Patient has insurance and PCP-no issues with transportation to appointments. ICM discussed PT/OT recommendations for outpatient PT-patient is agreeable to services. An ambulatory referral was submitted to the [facility] location. Office to call the patient within 3-5 business days to schedule an appointment. No further needs identified at this time.    Transition of Care Asessment: Insurance and Status: Insurance coverage has been reviewed Patient has primary care physician: Yes Home environment has been reviewed: reviewed Prior level of function:: independent Prior/Current Home Services: No current home services Social Drivers of Health Review: SDOH reviewed no interventions necessary Readmission risk has been reviewed: Yes Transition of care needs: no transition of care needs at this time

## 2024-10-16 DIAGNOSIS — E871 Hypo-osmolality and hyponatremia: Secondary | ICD-10-CM | POA: Diagnosis not present

## 2024-10-16 LAB — BASIC METABOLIC PANEL WITH GFR
Anion gap: 10 (ref 5–15)
Anion gap: 13 (ref 5–15)
BUN: 19 mg/dL (ref 8–23)
BUN: 16 mg/dL (ref 8–23)
CO2: 22 mmol/L (ref 22–32)
CO2: 23 mmol/L (ref 22–32)
Calcium: 8.8 mg/dL — ABNORMAL LOW (ref 8.9–10.3)
Calcium: 9 mg/dL (ref 8.9–10.3)
Chloride: 91 mmol/L — ABNORMAL LOW (ref 98–111)
Chloride: 87 mmol/L — ABNORMAL LOW (ref 98–111)
Creatinine, Ser: 0.86 mg/dL (ref 0.61–1.24)
Creatinine, Ser: 0.88 mg/dL (ref 0.61–1.24)
GFR, Estimated: >60 mL/min
GFR, Estimated: >60 mL/min
Glucose, Bld: 114 mg/dL — ABNORMAL HIGH (ref 70–99)
Glucose, Bld: 150 mg/dL — ABNORMAL HIGH (ref 70–99)
Potassium: 4.6 mmol/L (ref 3.5–5.1)
Potassium: 4.6 mmol/L (ref 3.5–5.1)
Sodium: 123 mmol/L — ABNORMAL LOW (ref 135–145)
Sodium: 124 mmol/L — ABNORMAL LOW (ref 135–145)

## 2024-10-16 MED ORDER — ESCITALOPRAM OXALATE 10 MG PO TABS
5.0000 mg | ORAL_TABLET | Freq: Every day | ORAL | Status: DC
  Administered 2024-10-16: 5 mg via ORAL
  Filled 2024-10-16: qty 1

## 2024-10-16 NOTE — Evaluation (Signed)
 Occupational Therapy Evaluation Patient Details Name: Brian Hunt MRN: [id number] DOB: 1946-03-22 Today's Date: 10/16/2024   History of Present Illness   79 y.o. male presents to [facility] 10/14/24 with SOB, recent fall 1 week prior. CTA chest showed mildly displaced L posterior 4th rib fx, PNA, and small R sided pleural effusion. Also with acute on chronic hypo-osmolar hyponatremia and sepsis. PMHx: chronic hyponatremia with baseline serum sodium range 124-130, send hypertension, BPH, paroxysmal atrial fibrillation, lead by sick sinus syndrome status post pacemaker placement, chronically anticoagulated on Xarelto , severe aortic stenosis status post TAVR in October 2024, COPD     Clinical Impressions PTA, pt lives with wife and typically completely Independent with ADLs, IADLs and mobility without AD. Pt presents now with deficits in cardiopulmonary endurance and dynamic standing balance. Pt not far from baseline though has new supplemental O2 needs. Pt requires overall Supervision for in room mobility without AD and for ADL completion. SpO2 88% on RA with light activity, up to 95% with 2 L O2. Anticipate no OT needs upon DC but will continue following acutely.      If plan is discharge home, recommend the following:   Other (comment) (PRN)     Functional Status Assessment   Patient has had a recent decline in their functional status and demonstrates the ability to make significant improvements in function in a reasonable and predictable amount of time.     Equipment Recommendations   None recommended by OT     Recommendations for Other Services         Precautions/Restrictions   Precautions Precautions: Fall Recall of Precautions/Restrictions: Intact Precaution/Restrictions Comments: watch O2 (does not wear at baseline) Restrictions Weight Bearing Restrictions Per Provider Order: No     Mobility Bed Mobility Overal bed mobility: Modified Independent                   Transfers Overall transfer level: Needs assistance Equipment used: None Transfers: Sit to/from Stand Sit to Stand: Supervision                  Balance Overall balance assessment: Needs assistance, Mild deficits observed, not formally tested, History of Falls Sitting-balance support: No upper extremity supported, Feet supported Sitting balance-Leahy Scale: Normal     Standing balance support: No upper extremity supported Standing balance-Leahy Scale: Fair                             ADL either performed or assessed with clinical judgement   ADL Overall ADL's : Needs assistance/impaired Eating/Feeding: Independent   Grooming: Supervision/safety;Standing;Wash/dry hands Grooming Details (indicate cue type and reason): cues for O2 tubing safety Upper Body Bathing: Modified independent   Lower Body Bathing: Supervison/ safety;Sit to/from stand   Upper Body Dressing : Modified independent   Lower Body Dressing: Supervision/safety   Toilet Transfer: Supervision/safety;Ambulation   Toileting- Clothing Manipulation and Hygiene: Supervision/safety;Sitting/lateral lean;Sit to/from stand Toileting - Clothing Manipulation Details (indicate cue type and reason): standing at bedside to use urinal     Functional mobility during ADLs: Supervision/safety General ADL Comments: Discussed considerations of supplemental O2 needs at home, use of shower chair as needed to maximize safety with showering tasks     Vision Baseline Vision/History: 1 Wears glasses Ability to See in Adequate Light: 0 Adequate Patient Visual Report: No change from baseline Vision Assessment?: No apparent visual deficits     Perception  Praxis         Pertinent Vitals/Pain Pain Assessment Pain Assessment: No/denies pain     Extremity/Trunk Assessment Upper Extremity Assessment Upper Extremity Assessment: Overall WFL for tasks assessed;Right hand dominant   Lower  Extremity Assessment Lower Extremity Assessment: Defer to PT evaluation   Cervical / Trunk Assessment Cervical / Trunk Assessment: Other exceptions Cervical / Trunk Exceptions: L posterior 4th rib fx   Communication Communication Communication: No apparent difficulties   Cognition Arousal: Alert Behavior During Therapy: WFL for tasks assessed/performed Cognition: No apparent impairments                               Following commands: Intact       Cueing  General Comments   Cueing Techniques: Verbal cues  O2 reading 88% on RA seated in chair. with application of new O2 probe, SpO2 95% w/ 2 L O2   Exercises     Shoulder Instructions      Home Living Family/patient expects to be discharged to:: Private residence Living Arrangements: Spouse/significant other Available Help at Discharge: Family;Available 24 hours/day Type of Home: House Home Access: Stairs to enter [employer] of Steps: 4 Entrance Stairs-Rails: Right Home Layout: Two level;Full bath on main level;Able to live on main level with bedroom/bathroom     Bathroom Shower/Tub: Walk-in shower;Tub/shower unit   Bathroom Toilet: Handicapped height Bathroom Accessibility: Yes   Home Equipment: Shower seat          Prior Functioning/Environment Prior Level of Function : Independent/Modified Independent;History of Falls (last six months);Driving             Mobility Comments: Ind with no AD, one recent fall when going down steps with a cooler in his hands ADLs Comments: Ind    OT Problem List: Decreased activity tolerance;Impaired balance (sitting and/or standing);Cardiopulmonary status limiting activity   OT Treatment/Interventions: Self-care/ADL training;Therapeutic exercise;Energy conservation;DME and/or AE instruction;Therapeutic activities;Patient/family education;Balance training      OT Goals(Current goals can be found in the care plan section)   Acute Rehab OT  Goals Patient Stated Goal: home today OT Goal Formulation: With patient Time For Goal Achievement: 10/30/24 Potential to Achieve Goals: Good   OT Frequency:  Min 2X/week    Co-evaluation              AM-PAC OT 6 Clicks Daily Activity     Outcome Measure Help from another person eating meals?: None Help from another person taking care of personal grooming?: None Help from another person toileting, which includes using toliet, bedpan, or urinal?: A Little Help from another person bathing (including washing, rinsing, drying)?: A Little Help from another person to put on and taking off regular upper body clothing?: None Help from another person to put on and taking off regular lower body clothing?: A Little 6 Click Score: 21   End of Session Equipment Utilized During Treatment: Oxygen   Activity Tolerance: Patient tolerated treatment well Patient left: in chair;with call bell/phone within reach;with chair alarm set  OT Visit Diagnosis: Muscle weakness (generalized) (M62.81)                Time: 9273-9252 OT Time Calculation (min): 21 min Charges:  OT General Charges $OT Visit: 1 Visit OT Evaluation $OT Eval Low Complexity: 1 Low  Mliss NOVAK, OTR/L Acute Rehab Services Office: [phone]   Mliss Fish 10/16/2024, 8:43 AM

## 2024-10-16 NOTE — Progress Notes (Signed)
 " PROGRESS NOTE    Brian Hunt  FMW:[id number] DOB: 03-30-1946 DOA: 10/14/2024 PCP: Joshua Debby CROME, MD    Brief Narrative:  79 year old with chronic hyponatremia with previous range of sodium 124-130, BPH, hypertension, paroxysmal A-fib sick sinus syndrome status post pacemaker chronically anticoagulated on Xarelto , aortic stenosis status post TAVR, COPD who had fallen on the floor about a week ago and had some left sided chest pain and discomfort, seen in the emergency room and found to have a left fifth rib fracture, was seen at follow-up with primary care physician's office, found to have more congestion and cough and sent to ER.  In the emergency room afebrile.  Heart rate controlled.  Blood pressure stable.  On room air.  Sodium 118 with chloride of 85.  Recent sodium was 127.  WC count 13.3.  CTA of the chest showed no evidence of PE, mildly displaced left posterior fourth rib fracture without evidence of pneumothorax, right lower lobe and right upper lobe infiltrates, small right-sided pleural effusion.  Patient was started on IV antibiotics, normal saline and admitted to the hospital.  Subjective:  Patient seen and examined.  Feels more comfortable.  Sitting at the chair.  Still on 1 to 2 L of oxygen .  Apparently we did not order his CPAP to be used at night.  Does have some congestion but better than yesterday.  He was looking forward to go home.  Sodium is 123.  No other overnight events.   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 Urine osmole and serum osmole low.  Does have chronic hyponatremia but presented with sodium of 118 which is lower than his usual.  Currently without any acute neurological deficits. Patient was given 1 L of fluid and then now on sodium chloride  1 g twice daily.  Will start water  restrictions 2 L/day. Recommend moderate salt in his diet.  Recommended replenish water  deficit with electrolyte waters.  Recheck levels today evening and  tomorrow morning. Patient was recently started on citalopram but he has hyponatremia issues for a long time.  Will continue citalopram today as this is helping him clinically.  Suspected right-sided pneumonia: Likely pneumonia or atelectasis secondary to rib fracture.  Pain is controlled now. Continue Rocephin  and azithromycin  today. Start incentive spirometry, mobility with PT OT. Mucolytic's and bronchodilators.  Deep breathing exercises. Procalcitonin is normal.  Will continue IV antibiotics today but anticipate de-escalating by tomorrow once his mobility improves. Check for ambulatory oxygen  whether he needs oxygen  to go home with.  Sleep apnea: Use CPAP at night.  Single rib fracture: Traumatic.  Stable.  Incentive.  Essential hypertension: Blood pressure is stable, continue losartan and metoprolol .  Hyperlipidemia: On a statin continue.  BPH: Stable now.  On Flomax .  Paroxysmal A-fib status post pacemaker: Paced rhythm.  Therapeutic on Xarelto .  On metoprolol .  COPD: Currently stable.  Bronchodilator therapy.  Chest physiotherapy.    DVT prophylaxis: SCDs Start: 10/14/24 2248 rivaroxaban  (XARELTO ) tablet 2.5 mg   Code Status: Full code Family Communication: None at the bedside Disposition Plan: Status is: Inpatient Remains inpatient appropriate because: Electrolyte monitoring, IV antibiotics     Consultants:  None  Procedures:  None  Antimicrobials:  Rocephin  azithromycin  1/27---     Objective: Vitals:   10/15/24 2213 10/15/24 2353 10/16/24 0445 10/16/24 0824  BP: (!) 157/71 (!) 143/68 129/62 132/66  Pulse: 81   79  Resp:  18 18 18   Temp:  98.6 F (37 C)  98.4 F (36.9 C) 97.8 F (36.6 C)  TempSrc:  Oral Oral Oral  SpO2:  93%  92%  Weight:   77.9 kg   Height:        Intake/Output Summary (Last 24 hours) at 10/16/2024 1135 Last data filed at 10/15/2024 2022 Gross per 24 hour  Intake 480 ml  Output 650 ml  Net -170 ml   Filed Weights   10/14/24  1858 10/15/24 0409 10/16/24 0445  Weight: 78.4 kg (P) 77.3 kg 77.9 kg    Examination:  General exam: Looks comfortable.  Pleasant and interactive. Respiratory system: Clear to auscultation. Respiratory effort normal.  no added sounds.   On 1.5 L oxygen .  Not using any accessory muscles. Cardiovascular system: S1 & S2 heard, irregularly irregular.  Pacemaker present left precordium.  No palpable tenderness.. No pedal edema. Gastrointestinal system: Abdomen is nondistended, soft and nontender. No organomegaly or masses felt. Normal bowel sounds heard. Central nervous system: Alert and oriented. No focal neurological deficits. Extremities: Symmetric 5 x 5 power. Skin: No rashes, lesions or ulcers Psychiatry: Judgement and insight appear normal. Mood & affect appropriate.     Data Reviewed: I have personally reviewed following labs and imaging studies  CBC: Recent Labs  Lab 10/14/24 1843 10/15/24 0200  WBC 13.3* 14.2*  NEUTROABS 11.6* 11.3*  HGB 11.5* 11.9*  HCT 32.7* 34.6*  MCV 92.6 94.3  PLT 281 266   Basic Metabolic Panel: Recent Labs  Lab 10/14/24 2330 10/15/24 0250 10/15/24 0842 10/15/24 1226 10/15/24 1623 10/16/24 0422  NA  --  122* 122* 123* 121* 123*  K  --  4.6 4.0 4.3 4.4 4.6  CL  --  89* 88* 87* 89* 91*  CO2  --  21* 24 23 22 22   GLUCOSE  --  143* 170* 107* 130* 114*  BUN  --  14 15 16 18 19   CREATININE  --  0.83 0.99 0.88 0.91 0.86  CALCIUM   --  8.7* 8.7* 8.5* 8.6* 8.8*  MG 1.8 2.0  --   --   --   --   PHOS  --  3.7  --   --   --   --    GFR: Estimated Creatinine Clearance: 66.2 mL/min (by C-G formula based on SCr of 0.86 mg/dL). Liver Function Tests: Recent Labs  Lab 10/14/24 1539 10/14/24 1843 10/15/24 0250  AST 17 21 26   ALT 15 14 15   ALKPHOS 83 97 107  BILITOT 0.6 0.7 0.4  PROT 7.5 7.0 6.8  ALBUMIN 3.4* 3.2* 3.0*   No results for input(s): LIPASE, AMYLASE in the last 168 hours. No results for input(s): AMMONIA in the last 168  hours. Coagulation Profile: No results for input(s): INR, PROTIME in the last 168 hours. Cardiac Enzymes: No results for input(s): CKTOTAL, CKMB, CKMBINDEX, TROPONINI in the last 168 hours. BNP (last 3 results) Recent Labs    10/15/24 0250  PROBNP 1,433.0*   HbA1C: Recent Labs    10/14/24 1643  HGBA1C 5.8*   CBG: No results for input(s): GLUCAP in the last 168 hours. Lipid Profile: No results for input(s): CHOL, HDL, LDLCALC, TRIG, CHOLHDL, LDLDIRECT in the last 72 hours. Thyroid  Function Tests: Recent Labs    10/14/24 2330 10/14/24 2332  TSH  --  0.934  FREET4 1.57  --    Anemia Panel: Recent Labs    10/14/24 1539  VITAMINB12 >1500*  FOLATE 12.7  FERRITIN 164.5  TIBC 278.6  IRON 13*   Sepsis  Labs: Recent Labs  Lab 10/14/24 2327 10/14/24 2330  PROCALCITON  --  <0.10  LATICACIDVEN 1.0  --     Recent Results (from the past 240 hours)  Culture, blood (Routine X 2) w Reflex to ID Panel     Status: None (Preliminary result)   Collection Time: 10/14/24 10:47 PM   Specimen: BLOOD RIGHT HAND  Result Value Ref Range Status   Specimen Description BLOOD RIGHT HAND  Final   Special Requests   Final    BOTTLES DRAWN AEROBIC ONLY Blood Culture adequate volume   Culture   Final    NO GROWTH 2 DAYS Performed at [facility], [address] [place], [place] [address]    Report Status PENDING  Incomplete  Culture, blood (Routine X 2) w Reflex to ID Panel     Status: None (Preliminary result)   Collection Time: 10/14/24 11:10 PM   Specimen: BLOOD LEFT ARM  Result Value Ref Range Status   Specimen Description BLOOD LEFT ARM  Final   Special Requests   Final    BOTTLES DRAWN AEROBIC AND ANAEROBIC Blood Culture adequate volume   Culture   Final    NO GROWTH 2 DAYS Performed at [facility], [address], [place], [place] [address]    Report Status PENDING  Incomplete         Radiology Studies: CT Angio Chest PE W  and/or Wo Contrast Result Date: 10/14/2024 EXAM: CTA of the Chest with contrast for PE 10/14/2024 09:05:53 PM TECHNIQUE: CTA of the chest was performed without and with the administration of 75 mL Omnipaque  350 mg/mL intravenous contrast. Multiplanar reformatted images are provided for review. MIP images are provided for review. Automated exposure control, iterative reconstruction, and/or weight based adjustment of the mA/kV was utilized to reduce the radiation dose to as low as reasonably achievable. COMPARISON: Prior plain film examination. CLINICAL HISTORY: Recent fall with chest pain. FINDINGS: PULMONARY ARTERIES: Shows a normal branching pattern bilaterally. No filling defects identified to suggest pulmonary embolism. Main pulmonary artery is normal in caliber. MEDIASTINUM: The heart and pericardium demonstrate no acute abnormality. Atherosclerotic calcification of the aorta is noted. LYMPH NODES: A prominent right hilar node is noted measuring up to 15 mm. This is likely reactive in nature. No mediastinal or axillary lymphadenopathy. LUNGS AND PLEURA: The lungs demonstrate emphysematous changes bilaterally. Small right pleural effusion is noted. Mild right upper lobe and lower lobe parenchymal infiltrate is seen. This is similar to that seen on prior plain film examination. A 7 mm right lower lobe nodule is noted best seen on image number 102 of series 6. No pneumothorax. UPPER ABDOMEN: The visualized upper abdomen is within normal limits. SOFT TISSUES AND BONES: Mildly displaced left fourth rib fracture is noted posteriorly. No acute soft tissue abnormality. IMPRESSION: 1. No evidence of pulmonary embolism. 2. Mildly displaced left posterior fourth rib fracture. 3. Mild right upper and lower lobe parenchymal infiltrate with small right pleural effusion, similar to prior plain film examination. 4. Solid 7 mm right lower lobe pulmonary nodule, recommend non-contrast chest CT at 6-12 months and repeat  non-contrast chest CT at 18-24 months as per Fleischner Society Guidelines. 5. Emphysematous changes bilaterally, pulmonary emphysema is an independent risk factor for lung cancer and recommend consideration for evaluation for a low-dose CT lung cancer screening program. Electronically signed by: Oneil Devonshire MD 10/14/2024 09:16 PM EST RP Workstation: [id number]   DG Chest 2 View Result Date: 10/14/2024 EXAM: 2  VIEW(S) XRAY OF THE CHEST 10/14/2024 03:45:21 PM COMPARISON: 10/06/2024 CLINICAL HISTORY: Fall. FINDINGS: LUNGS AND PLEURA: Pulmonary interstitial prominence with mild pulmonary edema. Patchy right alveolar process could represent asymmetric edema or pneumonia. Small right pleural effusion. No pneumothorax. HEART AND MEDIASTINUM: Cardiomegaly. Post TAVR. Left bipolar pacer. Calcified aorta. BONES AND SOFT TISSUES: No acute osseous abnormality. IMPRESSION: 1. Mild pulmonary edema. 2. Patchy right alveolar process, possibly representing asymmetric edema or pneumonia. 3. Small right pleural effusion. Electronically signed by: Elsie Gravely MD 10/14/2024 04:23 PM EST RP Workstation: [id number]   DG Ribs Unilateral Left Result Date: 10/14/2024 EXAM: 1 VIEW XRAY OF THE LEFT RIBS 10/14/2024 03:45:21 PM COMPARISON: None available. CLINICAL HISTORY: Fall. FINDINGS: BONES: No acute displaced rib fracture. LUNGS AND PLEURA: Visualized lungs demonstrate no acute abnormality. LINES AND TUBES: Left subclavian dual lead pacemaker in place. HEART AND MEDIASTINUM: Status post TAVR. Aortic calcification. IMPRESSION: 1. No acute displaced left rib fracture. Electronically signed by: Elsie Gravely MD 10/14/2024 04:22 PM EST RP Workstation: [id number]   DG Scapula Left Result Date: 10/14/2024 EXAM: 1 VIEW XRAY OF THE LEFT SCAPULA 10/14/2024 03:45:21 PM COMPARISON: None available. CLINICAL HISTORY: Fall. FINDINGS: BONES AND JOINTS: No acute fracture. No malalignment. No dislocation. No focal bone lesion or bone  destruction is identified. The bone cortex appears intact. Mild degenerative changes are present in the glenohumeral joint. SOFT TISSUES: The generator for the cardiac pacemaker overlies the scapula. The soft tissues are unremarkable. IMPRESSION: 1. No evidence of acute fracture or dislocation. 2. Mild degenerative changes in the glenohumeral joint. Electronically signed by: Elsie Gravely MD 10/14/2024 04:21 PM EST RP Workstation: [id number]        Scheduled Meds:  aspirin  EC  81 mg Oral Daily   azithromycin   500 mg Oral Daily   cilostazol   50 mg Oral BID   escitalopram   5 mg Oral Q1200   finasteride   5 mg Oral QHS   guaiFENesin   600 mg Oral BID   irbesartan   150 mg Oral Daily   metoprolol  succinate  25 mg Oral QHS   rivaroxaban   2.5 mg Oral BID WC   rosuvastatin   5 mg Oral QHS   sodium chloride   1 g Oral BID WC   tamsulosin   0.4 mg Oral QHS   Continuous Infusions:  cefTRIAXone  (ROCEPHIN )  IV 1 g (10/15/24 2221)     LOS: 2 days       Renato Applebaum, MD [facility]   "

## 2024-10-16 NOTE — Progress Notes (Signed)
" °   10/16/24 2251  BiPAP/CPAP/SIPAP  Reason BIPAP/CPAP not in use Non-compliant  BiPAP/CPAP /SiPAP Vitals  Pulse Rate 77  SpO2 96 %  Bilateral Breath Sounds Diminished  MEWS Score/Color  MEWS Score 0  MEWS Score Color Green    "

## 2024-10-16 NOTE — Plan of Care (Signed)
  Problem: Education: Goal: Knowledge of General Education information will improve Description: Including pain rating scale, medication(s)/side effects and non-pharmacologic comfort measures Outcome: Progressing   Problem: Health Behavior/Discharge Planning: Goal: Ability to manage health-related needs will improve Outcome: Progressing   Problem: Clinical Measurements: Goal: Respiratory complications will improve Outcome: Progressing Goal: Cardiovascular complication will be avoided Outcome: Progressing   Problem: Activity: Goal: Risk for activity intolerance will decrease Outcome: Progressing   Problem: Nutrition: Goal: Adequate nutrition will be maintained Outcome: Progressing   

## 2024-10-16 NOTE — Progress Notes (Signed)
 Mobility Specialist Progress Note;    10/16/24 1340  Mobility  Activity Ambulated with assistance  Level of Assistance Contact guard assist, steadying assist  Distance Ambulated (ft) 65 ft (x2)  Activity Response Tolerated well  Mobility Referral Yes  Mobility visit 1 Mobility  Mobility Specialist Start Time (ACUTE ONLY) 1340  Mobility Specialist Stop Time (ACUTE ONLY) 1353  Mobility Specialist Time Calculation (min) (ACUTE ONLY) 13 min   Pt received in bed agreeable to mobility, found on RA, SPO2 90-92%. Upon ambulation SPO2 decreased to 84-85% on RA,1 standing rest break required, given 2L/min, SPO2 93%. Returned to [facility] given a seated rest break, SPO2 91% on RA. Call bell and personal belongings in reach. All needs met.   Shamir Janeal MATSU, BS Mobility Specialist Please contact via Special Educational Needs Teacher or [employer] [phone]

## 2024-10-16 NOTE — Progress Notes (Signed)
 Mobility Specialist Progress Note;    10/16/24 1027  Mobility  Activity Ambulated with assistance  Level of Assistance Contact guard assist, steadying assist  Assistive Device None  Distance Ambulated (ft) 200 ft (x2)  Activity Response Tolerated well  Mobility Referral Yes  Mobility visit 1 Mobility  Mobility Specialist Start Time (ACUTE ONLY) 1027  Mobility Specialist Stop Time (ACUTE ONLY) 1042  Mobility Specialist Time Calculation (min) (ACUTE ONLY) 15 min    Patient received in bed agreeable to participate in mobility, found on 1.5L/min VSS. No physical assistance required, CGA for safety. 1 standing rest break d/t feeling a little winded,  SPO2 90-93% on 2L/min during ambulation. Returned to bed on 1.5L/min, SPO2 93%. Call bell and personal belongings. Bed alarm on.   [name], BS Mobility Specialist Please contact via Special Educational Needs Teacher or [employer] [phone]

## 2024-10-16 NOTE — Plan of Care (Signed)
  Problem: Education: Goal: Knowledge of General Education information will improve Description: Including pain rating scale, medication(s)/side effects and non-pharmacologic comfort measures Outcome: Progressing   Problem: Clinical Measurements: Goal: Will remain free from infection Outcome: Progressing   Problem: Nutrition: Goal: Adequate nutrition will be maintained Outcome: Progressing   

## 2024-10-17 ENCOUNTER — Other Ambulatory Visit (HOSPITAL_COMMUNITY): Payer: Self-pay

## 2024-10-17 ENCOUNTER — Encounter (HOSPITAL_COMMUNITY): Payer: Self-pay | Admitting: Internal Medicine

## 2024-10-17 DIAGNOSIS — E871 Hypo-osmolality and hyponatremia: Secondary | ICD-10-CM | POA: Diagnosis not present

## 2024-10-17 LAB — BASIC METABOLIC PANEL WITH GFR
Anion gap: 10 (ref 5–15)
BUN: 15 mg/dL (ref 8–23)
CO2: 25 mmol/L (ref 22–32)
Calcium: 8.5 mg/dL — ABNORMAL LOW (ref 8.9–10.3)
Chloride: 90 mmol/L — ABNORMAL LOW (ref 98–111)
Creatinine, Ser: 0.74 mg/dL (ref 0.61–1.24)
GFR, Estimated: >60 mL/min
Glucose, Bld: 101 mg/dL — ABNORMAL HIGH (ref 70–99)
Potassium: 4.9 mmol/L (ref 3.5–5.1)
Sodium: 125 mmol/L — ABNORMAL LOW (ref 135–145)

## 2024-10-17 LAB — CBC WITH DIFFERENTIAL/PLATELET
Abs Immature Granulocytes: 1.18 10*3/uL — ABNORMAL HIGH (ref 0.00–0.07)
Basophils Absolute: 0.1 10*3/uL (ref 0.0–0.1)
Basophils Relative: 1 %
Eosinophils Absolute: 0.2 10*3/uL (ref 0.0–0.5)
Eosinophils Relative: 1 %
HCT: 29.5 % — ABNORMAL LOW (ref 39.0–52.0)
Hemoglobin: 10.4 g/dL — ABNORMAL LOW (ref 13.0–17.0)
Immature Granulocytes: 10 %
Lymphocytes Relative: 13 %
Lymphs Abs: 1.4 10*3/uL (ref 0.7–4.0)
MCH: 32.5 pg (ref 26.0–34.0)
MCHC: 35.3 g/dL (ref 30.0–36.0)
MCV: 92.2 fL (ref 80.0–100.0)
Monocytes Absolute: 1.5 10*3/uL — ABNORMAL HIGH (ref 0.1–1.0)
Monocytes Relative: 13 %
Neutro Abs: 7.2 10*3/uL (ref 1.7–7.7)
Neutrophils Relative %: 62 %
Platelets: 291 10*3/uL (ref 150–400)
RBC: 3.2 MIL/uL — ABNORMAL LOW (ref 4.22–5.81)
RDW: 13 % (ref 11.5–15.5)
Smear Review: NORMAL
WBC: 11.6 10*3/uL — ABNORMAL HIGH (ref 4.0–10.5)
nRBC: 0 % (ref 0.0–0.2)

## 2024-10-17 MED ORDER — GUAIFENESIN ER 600 MG PO TB12
600.0000 mg | ORAL_TABLET | Freq: Two times a day (BID) | ORAL | 0 refills | Status: AC
  Filled 2024-10-17: qty 28, 14d supply, fill #0

## 2024-10-17 MED ORDER — ALBUTEROL SULFATE HFA 108 (90 BASE) MCG/ACT IN AERS
2.0000 | INHALATION_SPRAY | Freq: Four times a day (QID) | RESPIRATORY_TRACT | 2 refills | Status: AC | PRN
  Filled 2024-10-17: qty 6.7, 25d supply, fill #0

## 2024-10-17 MED ORDER — BENZONATATE 200 MG PO CAPS
200.0000 mg | ORAL_CAPSULE | Freq: Three times a day (TID) | ORAL | 0 refills | Status: AC | PRN
  Filled 2024-10-17: qty 20, 7d supply, fill #0

## 2024-10-17 MED ORDER — AMOXICILLIN-POT CLAVULANATE 875-125 MG PO TABS
1.0000 | ORAL_TABLET | Freq: Two times a day (BID) | ORAL | 0 refills | Status: AC
  Filled 2024-10-17: qty 10, 5d supply, fill #0

## 2024-10-17 MED ORDER — SODIUM CHLORIDE 1 G PO TABS
1.0000 g | ORAL_TABLET | Freq: Two times a day (BID) | ORAL | 0 refills | Status: AC
  Filled 2024-10-17: qty 60, 30d supply, fill #0

## 2024-10-17 NOTE — TOC Transition Note (Signed)
 Transition of Care [facility]) - Discharge Note   Patient Details  Name: Brian Hunt MRN: [id number] Date of Birth: [birth year]  Transition of Care [facility]) CM/SW Contact:  Sudie Erminio Deems, RN Phone Number: 10/17/2024, 10:03 AM   Clinical Narrative:  Patient plans for transition home today. Patient in need of oxygen . Patient chose [facility]- oxygen  will bee delivered to the room before discharge home. [facility] will assess in the home for a smaller portable tank- MD placed orders. Spouse at the bedside to transport home. No further needs identified at this time.    Final next level of care: OP Rehab Barriers to Discharge: No Barriers Identified   Patient Goals and CMS Choice Patient states their goals for this hospitalization and ongoing recovery are:: plan will be to transition home   Discharge Plan and Services Additional resources added to the After Visit Summary for   In-house Referral: NA   Post Acute Care Choice: Durable Medical Equipment          DME Arranged: Oxygen  DME Agency: [facility] Date DME Agency Contacted: 10/17/24 Time DME Agency Contacted: 1002 Representative spoke with at DME Agency: Lynwood CHEADLE Arranged: NA   Social Drivers of Health (SDOH) Interventions SDOH Screenings   Food Insecurity: No Food Insecurity (10/16/2024)  Housing: Low Risk (10/16/2024)  Transportation Needs: No Transportation Needs (10/16/2024)  Utilities: Not At Risk (10/16/2024)  Alcohol Screen: Low Risk (07/05/2023)  Depression (PHQ2-9): Low Risk (08/08/2024)  Financial Resource Strain: Low Risk (07/05/2023)  Physical Activity: Insufficiently Active (07/05/2023)  Social Connections: Moderately Integrated (10/16/2024)  Stress: No Stress Concern Present (07/05/2023)  Tobacco Use: High Risk (10/17/2024)  Health Literacy: Adequate Health Literacy (07/05/2023)     Readmission Risk Interventions     No data to display

## 2024-10-17 NOTE — Progress Notes (Signed)
 Mobility Specialist Progress Note;   10/17/24 0924  Mobility  Activity Ambulated with assistance  Level of Assistance Contact guard assist, steadying assist  Assistive Device None  Distance Ambulated (ft) 450 ft  Activity Response Tolerated well  Mobility Referral Yes  Mobility visit 1 Mobility  Mobility Specialist Start Time (ACUTE ONLY) [id number]  Mobility Specialist Stop Time (ACUTE ONLY) 0936  Mobility Specialist Time Calculation (min) (ACUTE ONLY) 12 min   Patient received in bed agreeable to participate in mobility. No physical assistance required, CGA for safety. Pt found on 2L/min. VSS throughout on 2L/min. Ambulated with steady gait. No complaints throughout. Pt Was left in bed with call bell and personal belongings in reach. All needs met. Wife in room.   Pre-mobility: SPO2 96% on 2L/min During-mobility: SPO2 91-94% on 2L/min   Oncologist Please contact via Special Educational Needs Teacher or [employer] [phone]

## 2024-10-17 NOTE — Plan of Care (Signed)

## 2024-10-17 NOTE — Discharge Summary (Signed)
 Physician Discharge Summary  Brian Hunt FMW:[id number] DOB: August 14, 1946 DOA: 10/14/2024  PCP: Joshua Debby CROME, MD  Admit date: 10/14/2024 Discharge date: 10/17/2024  Admitted From: Home Disposition: Home  Recommendations for Outpatient Follow-up:  Follow up with PCP in 1-2 weeks Please obtain BMP/CBC in one week   Home Health: N/A Equipment/Devices: Oxygen , 2 L  Discharge Condition: Stable CODE STATUS: Full code Diet recommendation: Low-salt diet  Discharge summary: 79 year old with chronic hyponatremia with previous range of sodium 124-130, BPH, hypertension, paroxysmal A-fib and  sick sinus syndrome status post pacemaker chronically anticoagulated on Xarelto , aortic stenosis status post TAVR on aspirin , COPD, ongoing smoker who had fallen on the floor about a week ago and had some left sided chest pain and discomfort, seen in the emergency room and found to have a left fifth rib fracture, was seen at follow-up with primary care physician's office, found to have more congestion and cough and sent to ER.  In the emergency room afebrile.  Heart rate controlled.  Blood pressure stable.  On room air initially.  Sodium 118 with chloride of 85.  Recent sodium was 127.  WBC count 13.3.  CTA of the chest showed no evidence of PE, mildly displaced left posterior fourth rib fracture without evidence of pneumothorax, right lower lobe and right upper lobe infiltrates, small right-sided pleural effusion.  Patient was started on IV antibiotics, normal saline and admitted to the hospital.  Sodium level gradually improved.  Respiratory symptoms improving but he is still requiring 2 L of oxygen  with mobility.  Patient will be going home with close outpatient follow-up.   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  Both serum and urine osmolality was low. Does have chronic hyponatremia but presented with sodium of 118 which is lower than his usual.  Currently without any acute neurological  deficits. Patient was given 1 L of fluid and then now on sodium chloride  1 g twice daily.   water  restrictions 2 L/day. Recommend moderate salt in his diet.  Recommended replenish water  deficit with electrolyte waters.  Sodium gradually improved to 125.  Patient was recently started on citalopram but he has hyponatremia issues for a long time.  Will continue citalopram this is helping him clinically. Hopefully he will stabilize with liberal sodium intake sodium chloride  supplementation and water  restriction.  Recommend rechecking in 1 week with PCP.   Suspected right-sided pneumonia: Likely pneumonia or atelectasis secondary to rib fracture.  Pain is controlled now. Treated with Rocephin  and azithromycin .  Blood cultures negative. Will continue Augmentin  for 5 more days to complete 7 days of therapy. Patient will be prescribed albuterol  inhaler as needed, patient will continue to do chest physiotherapy at home as instructed in the hospital. Mucolytic's and bronchodilators.  Deep breathing exercises. Procalcitonin is normal.    Hypoxemia, clinical diagnosis of COPD, active smoker, lung nodule. Improving but is still requiring 2 L of oxygen  with mobility.  He will be prescribed oxygen  to go home with. Patient does not have a formal diagnosis of COPD, however he has emphysematous lungs and has a 50-pack-year smoking history.  Will prescribe albuterol  as needed and will need close follow-up. Patient does have 7 mm lung nodule, will need repeat CT scan of the chest in 6 months. Advised to use CPAP at night.  Essential hypertension: Blood pressure is stable, continue losartan and metoprolol .   Hyperlipidemia: On a statin continue.   BPH: Stable now.  On Flomax .  No active issues.   Paroxysmal A-fib status  post pacemaker: Paced rhythm.  Therapeutic on Xarelto .  On metoprolol .   Status post TAVR, is stable on aspirin .  Patient also on cilostazol .  Advised to discuss with his cardiologist on  follow-up whether patient needs Xarelto , aspirin  and cilostazol  altogether.  Medically stabilized to discharge home.  Strongly recommended smoking cessation with history of peripheral arterial disease and multiple medical issues.  Discharge Diagnoses:  Principal Problem:   Acute hyponatremia Active Problems:   HLD (hyperlipidemia)   BPH (benign prostatic hyperplasia)   Pulmonary nodule   CAP (community acquired pneumonia)   Sepsis (HCC)   Leukocytosis   Left rib fracture   Generalized weakness   History of essential hypertension   Paroxysmal atrial fibrillation (HCC)   Anemia of chronic disease   History of COPD    Discharge Instructions  Discharge Instructions     Ambulatory referral to Physical Therapy   Complete by: As directed    Outpatient Physical Therapy-evaluation and treatment   Diet general   Complete by: As directed    Increase activity slowly   Complete by: As directed       Allergies as of 10/17/2024       Reactions   Demeclocycline Nausea Only   Nausea and constipation   Lisinopril  Cough   Oxycodone  Other (See Comments)   Mental confusion   Pravastatin  Other (See Comments)   myopathy        Medication List     STOP taking these medications    azithromycin  250 MG tablet Commonly known as: ZITHROMAX    predniSONE  10 MG tablet Commonly known as: DELTASONE        TAKE these medications    acetaminophen  500 MG tablet Commonly known as: TYLENOL  Take 1,000 mg by mouth every 6 (six) hours as needed for mild pain (pain score 1-3), headache or fever.   albuterol  108 (90 Base) MCG/ACT inhaler Commonly known as: VENTOLIN  HFA Inhale 2 puffs into the lungs every 6 (six) hours as needed for wheezing or shortness of breath.   amoxicillin -clavulanate 875-125 MG tablet Commonly known as: AUGMENTIN  Take 1 tablet by mouth 2 (two) times daily for 5 days.   aspirin  EC 81 MG tablet Take 81 mg by mouth daily.   benzonatate  200 MG capsule Commonly  known as: TESSALON  Take 1 capsule (200 mg total) by mouth 3 (three) times daily as needed for cough.   cilostazol  50 MG tablet Commonly known as: PLETAL  TAKE 1 TABLET(50 MG) BY MOUTH TWICE DAILY   cyanocobalamin  1000 MCG tablet Commonly known as: VITAMIN B12 Take 1,000 mcg by mouth daily.   escitalopram  5 MG tablet Commonly known as: Lexapro  Take 1 tablet (5 mg total) by mouth daily. ADDITIONAL REFILLS FROM YOUR PRIMARY CARE What changed: when to take this   famotidine 20 MG tablet Commonly known as: PEPCID Take 20 mg by mouth daily as needed for heartburn or indigestion.   finasteride  5 MG tablet Commonly known as: PROSCAR  Take 5 mg by mouth at bedtime.   guaiFENesin  600 MG 12 hr tablet Commonly known as: MUCINEX  Take 1 tablet (600 mg total) by mouth 2 (two) times daily for 14 days.   metoprolol  succinate 25 MG 24 hr tablet Commonly known as: TOPROL -XL TAKE 1 TABLET(25 MG) BY MOUTH DAILY WITH OR IMMEDIATELY FOLLOWING A MEAL What changed: See the new instructions.   morphine  15 MG tablet Commonly known as: MSIR Take 15 mg by mouth at bedtime. Take one tablet (15mg ) by mouth at bedtime.  May  take up to 2 additional doses every 8 hours as needed.   rivaroxaban  2.5 MG Tabs tablet Commonly known as: XARELTO  TAKE 1 TABLET(2.5 MG) BY MOUTH TWICE DAILY   rosuvastatin  10 MG tablet Commonly known as: CRESTOR  Take 5 mg by mouth at bedtime.   sodium chloride  1 g tablet Take 1 tablet (1 g total) by mouth 2 (two) times daily with a meal.   Systane Balance 0.6 % Soln Generic drug: Propylene Glycol Place 1 drop into both eyes as needed (dry eyes).   tamsulosin  0.4 MG Caps capsule Commonly known as: FLOMAX  Take 0.4 mg by mouth at bedtime.   telmisartan  80 MG tablet Commonly known as: MICARDIS  Take 0.5 tablets (40 mg total) by mouth in the morning and at bedtime.               Durable Medical Equipment  (From admission, onward)           Start     Ordered    10/17/24 0731  For home use only DME oxygen   Once       Question Answer Comment  Length of Need Lifetime   Mode or (Route) Nasal cannula   Liters per Minute 2   Frequency Continuous (stationary and portable oxygen  unit needed)   Oxygen  conserving device Yes   Oxygen  delivery system: Gas      10/17/24 0730            Follow-up Information     [facility] Follow up.   Specialty: Rehabilitation Why: Outpatient Physical Therapy-Office to call with visit times within the next 3-5 business days. If the office has not called; please call them. Contact information: [address] [place]  [phone]               Allergies[1]  Consultations: None   Procedures/Studies: CT Angio Chest PE W and/or Wo Contrast Result Date: 10/14/2024 EXAM: CTA of the Chest with contrast for PE 10/14/2024 09:05:53 PM TECHNIQUE: CTA of the chest was performed without and with the administration of 75 mL Omnipaque  350 mg/mL intravenous contrast. Multiplanar reformatted images are provided for review. MIP images are provided for review. Automated exposure control, iterative reconstruction, and/or weight based adjustment of the mA/kV was utilized to reduce the radiation dose to as low as reasonably achievable. COMPARISON: Prior plain film examination. CLINICAL HISTORY: Recent fall with chest pain. FINDINGS: PULMONARY ARTERIES: Shows a normal branching pattern bilaterally. No filling defects identified to suggest pulmonary embolism. Main pulmonary artery is normal in caliber. MEDIASTINUM: The heart and pericardium demonstrate no acute abnormality. Atherosclerotic calcification of the aorta is noted. LYMPH NODES: A prominent right hilar node is noted measuring up to 15 mm. This is likely reactive in nature. No mediastinal or axillary lymphadenopathy. LUNGS AND PLEURA: The lungs demonstrate emphysematous changes bilaterally. Small right  pleural effusion is noted. Mild right upper lobe and lower lobe parenchymal infiltrate is seen. This is similar to that seen on prior plain film examination. A 7 mm right lower lobe nodule is noted best seen on image number 102 of series 6. No pneumothorax. UPPER ABDOMEN: The visualized upper abdomen is within normal limits. SOFT TISSUES AND BONES: Mildly displaced left fourth rib fracture is noted posteriorly. No acute soft tissue abnormality. IMPRESSION: 1. No evidence of pulmonary embolism. 2. Mildly displaced left posterior fourth rib fracture. 3. Mild right upper and lower lobe parenchymal infiltrate with small right pleural effusion, similar  to prior plain film examination. 4. Solid 7 mm right lower lobe pulmonary nodule, recommend non-contrast chest CT at 6-12 months and repeat non-contrast chest CT at 18-24 months as per Fleischner Society Guidelines. 5. Emphysematous changes bilaterally, pulmonary emphysema is an independent risk factor for lung cancer and recommend consideration for evaluation for a low-dose CT lung cancer screening program. Electronically signed by: Oneil Devonshire MD 10/14/2024 09:16 PM EST RP Workstation: [id number]   DG Chest 2 View Result Date: 10/14/2024 EXAM: 2 VIEW(S) XRAY OF THE CHEST 10/14/2024 03:45:21 PM COMPARISON: 10/06/2024 CLINICAL HISTORY: Fall. FINDINGS: LUNGS AND PLEURA: Pulmonary interstitial prominence with mild pulmonary edema. Patchy right alveolar process could represent asymmetric edema or pneumonia. Small right pleural effusion. No pneumothorax. HEART AND MEDIASTINUM: Cardiomegaly. Post TAVR. Left bipolar pacer. Calcified aorta. BONES AND SOFT TISSUES: No acute osseous abnormality. IMPRESSION: 1. Mild pulmonary edema. 2. Patchy right alveolar process, possibly representing asymmetric edema or pneumonia. 3. Small right pleural effusion. Electronically signed by: Elsie Gravely MD 10/14/2024 04:23 PM EST RP Workstation: [id number]   DG Ribs Unilateral Left Result  Date: 10/14/2024 EXAM: 1 VIEW XRAY OF THE LEFT RIBS 10/14/2024 03:45:21 PM COMPARISON: None available. CLINICAL HISTORY: Fall. FINDINGS: BONES: No acute displaced rib fracture. LUNGS AND PLEURA: Visualized lungs demonstrate no acute abnormality. LINES AND TUBES: Left subclavian dual lead pacemaker in place. HEART AND MEDIASTINUM: Status post TAVR. Aortic calcification. IMPRESSION: 1. No acute displaced left rib fracture. Electronically signed by: Elsie Gravely MD 10/14/2024 04:22 PM EST RP Workstation: [id number]   DG Scapula Left Result Date: 10/14/2024 EXAM: 1 VIEW XRAY OF THE LEFT SCAPULA 10/14/2024 03:45:21 PM COMPARISON: None available. CLINICAL HISTORY: Fall. FINDINGS: BONES AND JOINTS: No acute fracture. No malalignment. No dislocation. No focal bone lesion or bone destruction is identified. The bone cortex appears intact. Mild degenerative changes are present in the glenohumeral joint. SOFT TISSUES: The generator for the cardiac pacemaker overlies the scapula. The soft tissues are unremarkable. IMPRESSION: 1. No evidence of acute fracture or dislocation. 2. Mild degenerative changes in the glenohumeral joint. Electronically signed by: Elsie Gravely MD 10/14/2024 04:21 PM EST RP Workstation: [id number]   DG Chest 2 View Result Date: 10/06/2024 CLINICAL DATA:  Fall, chest pain and shortness of breath. EXAM: CHEST - 2 VIEW COMPARISON:  02/19/2024 and CT chest 04/12/2021. FINDINGS: Trachea is midline. Heart is at the upper limits of normal in size to mildly enlarged. Thoracic aorta is calcified. Pacemaker lead tips are in the right atrium and right ventricle. Aortic valve replacement. Lungs are clear. No pleural fluid. Right shoulder arthroplasty. IMPRESSION: No acute findings. Electronically Signed   By: Newell Eke M.D.   On: 10/06/2024 14:31   (Echo, Carotid, EGD, Colonoscopy, ERCP)    Subjective: Patient seen and examined.  Wife at the bedside.  He still has some productive cough.   Afebrile.  Sinus rhythm on the monitor.  Breathing much better, however is still needing some oxygen  with mobility.  We discussed about his smoking cessation, use of oxygen  and using albuterol .  Patient will also continue to use incentive spirometry at home.   Discharge Exam: Vitals:   10/16/24 2318 10/17/24 0447  BP: (!) 160/77 (!) 141/70  Pulse: 83   Resp:  17  Temp: 97.7 F (36.5 C) 97.6 F (36.4 C)  SpO2: 96% 92%   Vitals:   10/16/24 2010 10/16/24 2251 10/16/24 2318 10/17/24 0447  BP: (!) 158/69  (!) 160/77 (!) 141/70  Pulse:  77 83   Resp: 18  17  Temp: 98.8 F (37.1 C)  97.7 F (36.5 C) 97.6 F (36.4 C)  TempSrc: Oral  Oral Oral  SpO2: 96% 96% 96% 92%  Weight:    77.7 kg  Height:        General: Pt is alert, awake, not in acute distress Sitting in chair.  Comfortable.  Able to talk in complete sentences Cardiovascular: RRR, S1/S2 +, no rubs, no gallops.  Pacemaker left recording. Respiratory: CTA bilaterally, no wheezing, no rhonchi, some conducted upper airway sounds. Abdominal: Soft, NT, ND, bowel sounds + Extremities: no edema, no cyanosis    The results of significant diagnostics from this hospitalization (including imaging, microbiology, ancillary and laboratory) are listed below for reference.     Microbiology: Recent Results (from the past 240 hours)  Culture, blood (Routine X 2) w Reflex to ID Panel     Status: None (Preliminary result)   Collection Time: 10/14/24 10:47 PM   Specimen: BLOOD RIGHT HAND  Result Value Ref Range Status   Specimen Description BLOOD RIGHT HAND  Final   Special Requests   Final    BOTTLES DRAWN AEROBIC ONLY Blood Culture adequate volume   Culture   Final    NO GROWTH 3 DAYS Performed at [facility], [address], [place], [place] [address]    Report Status PENDING  Incomplete  Culture, blood (Routine X 2) w Reflex to ID Panel     Status: None (Preliminary result)   Collection Time: 10/14/24 11:10 PM    Specimen: BLOOD LEFT ARM  Result Value Ref Range Status   Specimen Description BLOOD LEFT ARM  Final   Special Requests   Final    BOTTLES DRAWN AEROBIC AND ANAEROBIC Blood Culture adequate volume   Culture   Final    NO GROWTH 3 DAYS Performed at [facility], [address], [place], [place] [address]    Report Status PENDING  Incomplete     Labs: BNP (last 3 results) No results for input(s): BNP in the last 8760 hours. Basic Metabolic Panel: Recent Labs  Lab 10/14/24 2330 10/15/24 0250 10/15/24 0842 10/15/24 1226 10/15/24 1623 10/16/24 0422 10/16/24 1851 10/17/24 0433  NA  --  122*   < > 123* 121* 123* 124* 125*  K  --  4.6   < > 4.3 4.4 4.6 4.6 4.9  CL  --  89*   < > 87* 89* 91* 87* 90*  CO2  --  21*   < > 23 22 22 23 25   GLUCOSE  --  143*   < > 107* 130* 114* 150* 101*  BUN  --  14   < > 16 18 19 16 15   CREATININE  --  0.83   < > 0.88 0.91 0.86 0.88 0.74  CALCIUM   --  8.7*   < > 8.5* 8.6* 8.8* 9.0 8.5*  MG 1.8 2.0  --   --   --   --   --   --   PHOS  --  3.7  --   --   --   --   --   --    < > = values in this interval not displayed.   Liver Function Tests: Recent Labs  Lab 10/14/24 1539 10/14/24 1843 10/15/24 0250  AST 17 21 26   ALT 15 14 15   ALKPHOS 83 97 107  BILITOT 0.6 0.7 0.4  PROT 7.5 7.0 6.8  ALBUMIN 3.4* 3.2* 3.0*  No results for input(s): LIPASE, AMYLASE in the last 168 hours. No results for input(s): AMMONIA in the last 168 hours. CBC: Recent Labs  Lab 10/14/24 1843 10/15/24 0200 10/17/24 0433  WBC 13.3* 14.2* 11.6*  NEUTROABS 11.6* 11.3* 7.2  HGB 11.5* 11.9* 10.4*  HCT 32.7* 34.6* 29.5*  MCV 92.6 94.3 92.2  PLT 281 266 291   Cardiac Enzymes: No results for input(s): CKTOTAL, CKMB, CKMBINDEX, TROPONINI in the last 168 hours. BNP: Invalid input(s): POCBNP CBG: No results for input(s): GLUCAP in the last 168 hours. D-Dimer No results for input(s): DDIMER in the last 72 hours. Hgb A1c Recent Labs     10/14/24 1643  HGBA1C 5.8*   Lipid Profile No results for input(s): CHOL, HDL, LDLCALC, TRIG, CHOLHDL, LDLDIRECT in the last 72 hours. Thyroid  function studies Recent Labs    10/14/24 2332  TSH 0.934   Anemia work up Recent Labs    10/14/24 1539  VITAMINB12 >1500*  FOLATE 12.7  FERRITIN 164.5  TIBC 278.6  IRON 13*   Urinalysis    Component Value Date/Time   COLORURINE YELLOW 10/14/2024 1843   APPEARANCEUR CLEAR 10/14/2024 1843   LABSPEC 1.015 10/14/2024 1843   PHURINE 6.0 10/14/2024 1843   GLUCOSEU 50 (A) 10/14/2024 1843   GLUCOSEU NEGATIVE 08/21/2019 1003   HGBUR NEGATIVE 10/14/2024 1843   BILIRUBINUR NEGATIVE 10/14/2024 1843   KETONESUR 20 (A) 10/14/2024 1843   PROTEINUR 30 (A) 10/14/2024 1843   UROBILINOGEN 0.2 08/21/2019 1003   NITRITE NEGATIVE 10/14/2024 1843   LEUKOCYTESUR NEGATIVE 10/14/2024 1843   Sepsis Labs Recent Labs  Lab 10/14/24 1843 10/15/24 0200 10/17/24 0433  WBC 13.3* 14.2* 11.6*   Microbiology Recent Results (from the past 240 hours)  Culture, blood (Routine X 2) w Reflex to ID Panel     Status: None (Preliminary result)   Collection Time: 10/14/24 10:47 PM   Specimen: BLOOD RIGHT HAND  Result Value Ref Range Status   Specimen Description BLOOD RIGHT HAND  Final   Special Requests   Final    BOTTLES DRAWN AEROBIC ONLY Blood Culture adequate volume   Culture   Final    NO GROWTH 3 DAYS Performed at [facility], [address] [place], [place] [address]    Report Status PENDING  Incomplete  Culture, blood (Routine X 2) w Reflex to ID Panel     Status: None (Preliminary result)   Collection Time: 10/14/24 11:10 PM   Specimen: BLOOD LEFT ARM  Result Value Ref Range Status   Specimen Description BLOOD LEFT ARM  Final   Special Requests   Final    BOTTLES DRAWN AEROBIC AND ANAEROBIC Blood Culture adequate volume   Culture   Final    NO GROWTH 3 DAYS Performed at [facility], [address], [place],  [place] [address]    Report Status PENDING  Incomplete     Time coordinating discharge: 35 minutes  SIGNED:   Renato Applebaum, MD  [facility] 10/17/2024, 8:35 AM     [1]  Allergies Allergen Reactions   Demeclocycline Nausea Only    Nausea and constipation   Lisinopril  Cough   Oxycodone  Other (See Comments)    Mental confusion   Pravastatin  Other (See Comments)    myopathy

## 2024-10-17 NOTE — Care Management Important Message (Signed)
 Important Message  Patient Details  Name: Brian Hunt MRN: [id number] Date of Birth: 1946/01/25   Important Message Given:  Yes - Medicare IM     Vonzell Arrie Sharps 10/17/2024, 11:20 AM

## 2024-10-17 NOTE — Progress Notes (Signed)
 Home 02 delivered to pt's room.  Education done with pt and SO who verbalized understanding.  DC teaching done with [employer]AT RN Newell.  IV and tele removed.  Pt to pick up TOC meds. Pt  discharged/left unit via wheelchair with Melinda, RN without incident

## 2024-10-17 NOTE — Progress Notes (Signed)
 SATURATION QUALIFICATIONS: (This note is used to comply with regulatory documentation for home oxygen )  Patient Saturations on Room Air at Rest = 85%  Patient Saturations on Room Air while Ambulating = 84%  Patient Saturations on 2 Liters of oxygen  while Ambulating = 94%  Please briefly explain why patient needs home oxygen :  Pt desat on RA while at rest and with ambulation.  Pt c/o SOB, DOE.  Pt placed on 2L [place], 02 Sat improved to 94% while ambulating with oxygen .

## 2024-10-19 LAB — CULTURE, BLOOD (ROUTINE X 2)
Culture: NO GROWTH
Culture: NO GROWTH
Special Requests: ADEQUATE
Special Requests: ADEQUATE

## 2024-10-20 ENCOUNTER — Telehealth: Payer: Self-pay

## 2024-10-20 ENCOUNTER — Encounter (HOSPITAL_BASED_OUTPATIENT_CLINIC_OR_DEPARTMENT_OTHER): Payer: Self-pay | Admitting: Cardiovascular Disease

## 2024-10-20 ENCOUNTER — Ambulatory Visit: Payer: Medicare PPO

## 2024-10-20 DIAGNOSIS — I442 Atrioventricular block, complete: Secondary | ICD-10-CM

## 2024-10-20 DIAGNOSIS — R296 Repeated falls: Secondary | ICD-10-CM

## 2024-10-21 ENCOUNTER — Telehealth: Payer: Self-pay

## 2024-10-21 LAB — CUP PACEART REMOTE DEVICE CHECK
Battery Voltage: 90
Date Time Interrogation Session: 20260202110308
Implantable Lead Connection Status: 753985
Implantable Lead Connection Status: 753985
Implantable Lead Implant Date: 20241023
Implantable Lead Implant Date: 20241023
Implantable Lead Location: 753859
Implantable Lead Location: 753860
Implantable Lead Model: 377
Implantable Lead Model: 377
Implantable Lead Serial Number: 8001570721
Implantable Lead Serial Number: 8001632583
Implantable Pulse Generator Implant Date: 20241023
Pulse Gen Serial Number: 1000330918

## 2024-10-24 ENCOUNTER — Telehealth: Payer: Self-pay

## 2024-10-24 ENCOUNTER — Other Ambulatory Visit: Payer: Self-pay | Admitting: Internal Medicine

## 2024-10-24 DIAGNOSIS — R296 Repeated falls: Secondary | ICD-10-CM

## 2024-10-24 DIAGNOSIS — E60 Dietary zinc deficiency: Secondary | ICD-10-CM | POA: Insufficient documentation

## 2024-10-24 DIAGNOSIS — J410 Simple chronic bronchitis: Secondary | ICD-10-CM

## 2024-10-24 LAB — EXTRA SPECIMEN

## 2024-10-24 LAB — ZINC: Zinc: 39 ug/dL — ABNORMAL LOW (ref 60–130)

## 2024-10-24 LAB — VITAMIN B1: Vitamin B1 (Thiamine): 14 nmol/L (ref 8–30)

## 2024-10-24 LAB — RETICULOCYTES
ABS Retic: 56700 {cells}/uL (ref 25000–90000)
Retic Ct Pct: 1.5 %

## 2024-10-24 LAB — SODIUM, URINE, RANDOM: Sodium, Ur: 14 mmol/L — ABNORMAL LOW (ref 28–272)

## 2024-10-24 MED ORDER — ZINC GLUCONATE 50 MG PO TABS: 50.0000 mg | ORAL_TABLET | Freq: Every day | ORAL | 0 refills | Status: AC

## 2024-10-24 NOTE — Progress Notes (Signed)
 Complex Care Management Note  Care Guide Note 10/24/2024 Name: Brian Hunt MRN: [id number] DOB: 10-05-1945  Brian Hunt is a 79 y.o. year old male who sees Joshua Debby CROME, MD for primary care. I reached out to Daril JULIANNA Bohr by phone today to offer complex care management services.  Mr. Hufford was given information about Complex Care Management services today including:   The Complex Care Management services include support from the care team which includes your Nurse Care Manager, Clinical Social Worker, or Pharmacist.  The Complex Care Management team is here to help remove barriers to the health concerns and goals most important to you. Complex Care Management services are voluntary, and the patient may decline or stop services at any time by request to their care team member.   Complex Care Management Consent Status: Patient agreed to services and verbal consent obtained.   Follow up plan:  Telephone appointment with complex care management team member scheduled for:  10/31/24  Encounter Outcome:  Patient Scheduled  Doyce [facility]  [facility], [facility] Guide Direct Dial: [phone]  Fax: [phone]

## 2024-10-24 NOTE — Progress Notes (Signed)
 Remote PPM Transmission

## 2024-10-27 ENCOUNTER — Inpatient Hospital Stay: Admitting: Family Medicine

## 2024-10-31 ENCOUNTER — Telehealth

## 2024-11-03 ENCOUNTER — Ambulatory Visit

## 2024-11-03 ENCOUNTER — Ambulatory Visit (INDEPENDENT_AMBULATORY_CARE_PROVIDER_SITE_OTHER): Admitting: Otolaryngology

## 2024-11-24 ENCOUNTER — Ambulatory Visit: Admitting: Pulmonary Disease

## 2025-02-02 ENCOUNTER — Ambulatory Visit (HOSPITAL_BASED_OUTPATIENT_CLINIC_OR_DEPARTMENT_OTHER): Admitting: Cardiovascular Disease
